# Patient Record
Sex: Male | Born: 1935 | Race: White | Hispanic: No | Marital: Married | Smoking: Former smoker
Health system: Southern US, Community
[De-identification: ages and names within clinical notes are randomized; demographics above are authoritative.]

## PROBLEM LIST (undated history)

## (undated) DIAGNOSIS — E039 Hypothyroidism, unspecified: Secondary | ICD-10-CM

## (undated) DIAGNOSIS — G20A1 Parkinson's disease without dyskinesia, without mention of fluctuations: Secondary | ICD-10-CM

## (undated) DIAGNOSIS — I10 Essential (primary) hypertension: Secondary | ICD-10-CM

## (undated) DIAGNOSIS — Z86711 Personal history of pulmonary embolism: Secondary | ICD-10-CM

## (undated) DIAGNOSIS — Z95828 Presence of other vascular implants and grafts: Secondary | ICD-10-CM

## (undated) DIAGNOSIS — Z9889 Other specified postprocedural states: Secondary | ICD-10-CM

## (undated) DIAGNOSIS — K5909 Other constipation: Secondary | ICD-10-CM

## (undated) DIAGNOSIS — Z87438 Personal history of other diseases of male genital organs: Secondary | ICD-10-CM

## (undated) DIAGNOSIS — M545 Low back pain, unspecified: Secondary | ICD-10-CM

## (undated) DIAGNOSIS — K449 Diaphragmatic hernia without obstruction or gangrene: Secondary | ICD-10-CM

## (undated) DIAGNOSIS — N201 Calculus of ureter: Secondary | ICD-10-CM

## (undated) DIAGNOSIS — Z972 Presence of dental prosthetic device (complete) (partial): Secondary | ICD-10-CM

## (undated) DIAGNOSIS — M5136 Other intervertebral disc degeneration, lumbar region: Secondary | ICD-10-CM

## (undated) DIAGNOSIS — Z8739 Personal history of other diseases of the musculoskeletal system and connective tissue: Secondary | ICD-10-CM

## (undated) DIAGNOSIS — E785 Hyperlipidemia, unspecified: Secondary | ICD-10-CM

## (undated) DIAGNOSIS — G8929 Other chronic pain: Secondary | ICD-10-CM

## (undated) DIAGNOSIS — G214 Vascular parkinsonism: Secondary | ICD-10-CM

## (undated) DIAGNOSIS — I714 Abdominal aortic aneurysm, without rupture, unspecified: Secondary | ICD-10-CM

## (undated) DIAGNOSIS — Z85828 Personal history of other malignant neoplasm of skin: Secondary | ICD-10-CM

## (undated) DIAGNOSIS — Z7901 Long term (current) use of anticoagulants: Secondary | ICD-10-CM

## (undated) DIAGNOSIS — Z974 Presence of external hearing-aid: Secondary | ICD-10-CM

## (undated) DIAGNOSIS — M51369 Other intervertebral disc degeneration, lumbar region without mention of lumbar back pain or lower extremity pain: Secondary | ICD-10-CM

## (undated) DIAGNOSIS — G20 Parkinson's disease: Secondary | ICD-10-CM

## (undated) DIAGNOSIS — M1990 Unspecified osteoarthritis, unspecified site: Secondary | ICD-10-CM

## (undated) DIAGNOSIS — G4730 Sleep apnea, unspecified: Secondary | ICD-10-CM

## (undated) DIAGNOSIS — Z86010 Personal history of colonic polyps: Secondary | ICD-10-CM

## (undated) DIAGNOSIS — Z860101 Personal history of adenomatous and serrated colon polyps: Secondary | ICD-10-CM

## (undated) DIAGNOSIS — K219 Gastro-esophageal reflux disease without esophagitis: Secondary | ICD-10-CM

## (undated) DIAGNOSIS — I4510 Unspecified right bundle-branch block: Secondary | ICD-10-CM

## (undated) DIAGNOSIS — F0150 Vascular dementia without behavioral disturbance: Secondary | ICD-10-CM

## (undated) HISTORY — PX: CHOLECYSTECTOMY OPEN: SUR202

## (undated) HISTORY — DX: Essential (primary) hypertension: I10

## (undated) HISTORY — PX: KNEE ARTHROSCOPY: SUR90

## (undated) HISTORY — PX: TOTAL KNEE ARTHROPLASTY: SHX125

## (undated) HISTORY — DX: Gastro-esophageal reflux disease without esophagitis: K21.9

## (undated) HISTORY — PX: COLONOSCOPY: SHX174

## (undated) HISTORY — PX: TONSILLECTOMY: SUR1361

## (undated) HISTORY — PX: SHOULDER OPEN ROTATOR CUFF REPAIR: SHX2407

## (undated) HISTORY — PX: CATARACT EXTRACTION W/ INTRAOCULAR LENS  IMPLANT, BILATERAL: SHX1307

## (undated) HISTORY — DX: Hyperlipidemia, unspecified: E78.5

## (undated) HISTORY — PX: ABDOMINAL AORTIC ENDOVASCULAR STENT GRAFT: SHX5707

---

## 1999-09-28 ENCOUNTER — Encounter: Payer: Self-pay | Admitting: Vascular Surgery

## 1999-09-28 ENCOUNTER — Encounter: Admission: RE | Admit: 1999-09-28 | Discharge: 1999-09-28 | Payer: Self-pay | Admitting: Vascular Surgery

## 2000-01-29 ENCOUNTER — Encounter: Payer: Self-pay | Admitting: Vascular Surgery

## 2000-01-29 ENCOUNTER — Encounter: Admission: RE | Admit: 2000-01-29 | Discharge: 2000-01-29 | Payer: Self-pay | Admitting: Vascular Surgery

## 2000-01-30 ENCOUNTER — Encounter: Payer: Self-pay | Admitting: Vascular Surgery

## 2000-01-31 ENCOUNTER — Ambulatory Visit: Admission: RE | Admit: 2000-01-31 | Discharge: 2000-01-31 | Payer: Self-pay | Admitting: Vascular Surgery

## 2000-04-01 ENCOUNTER — Encounter: Payer: Self-pay | Admitting: Vascular Surgery

## 2000-04-01 ENCOUNTER — Inpatient Hospital Stay: Admission: RE | Admit: 2000-04-01 | Discharge: 2000-04-04 | Payer: Self-pay | Admitting: Vascular Surgery

## 2000-07-31 ENCOUNTER — Encounter: Payer: Self-pay | Admitting: Specialist

## 2000-08-08 ENCOUNTER — Inpatient Hospital Stay (HOSPITAL_COMMUNITY): Admission: RE | Admit: 2000-08-08 | Discharge: 2000-08-13 | Payer: Self-pay | Admitting: Specialist

## 2000-10-22 ENCOUNTER — Encounter: Admission: RE | Admit: 2000-10-22 | Discharge: 2000-10-22 | Payer: Self-pay | Admitting: Vascular Surgery

## 2000-10-22 ENCOUNTER — Encounter: Payer: Self-pay | Admitting: Vascular Surgery

## 2001-10-28 ENCOUNTER — Encounter: Admission: RE | Admit: 2001-10-28 | Discharge: 2001-10-28 | Payer: Self-pay | Admitting: Vascular Surgery

## 2001-10-28 ENCOUNTER — Encounter: Payer: Self-pay | Admitting: Vascular Surgery

## 2002-03-16 ENCOUNTER — Encounter: Payer: Self-pay | Admitting: Orthopaedic Surgery

## 2002-03-16 ENCOUNTER — Encounter: Admission: RE | Admit: 2002-03-16 | Discharge: 2002-03-16 | Payer: Self-pay | Admitting: Orthopaedic Surgery

## 2002-03-24 ENCOUNTER — Encounter: Payer: Self-pay | Admitting: Orthopaedic Surgery

## 2002-03-24 ENCOUNTER — Inpatient Hospital Stay (HOSPITAL_COMMUNITY): Admission: RE | Admit: 2002-03-24 | Discharge: 2002-03-27 | Payer: Self-pay | Admitting: Orthopaedic Surgery

## 2002-03-24 HISTORY — PX: POSTERIOR LAMINECTOMY / DECOMPRESSION LUMBAR SPINE: SUR740

## 2002-04-28 ENCOUNTER — Encounter: Payer: Self-pay | Admitting: Vascular Surgery

## 2002-04-28 ENCOUNTER — Encounter: Admission: RE | Admit: 2002-04-28 | Discharge: 2002-04-28 | Payer: Self-pay | Admitting: Vascular Surgery

## 2003-01-24 ENCOUNTER — Encounter: Payer: Self-pay | Admitting: Specialist

## 2003-01-28 ENCOUNTER — Inpatient Hospital Stay (HOSPITAL_COMMUNITY): Admission: RE | Admit: 2003-01-28 | Discharge: 2003-02-01 | Payer: Self-pay | Admitting: Specialist

## 2003-01-31 ENCOUNTER — Encounter: Payer: Self-pay | Admitting: Specialist

## 2003-05-04 ENCOUNTER — Encounter: Admission: RE | Admit: 2003-05-04 | Discharge: 2003-05-04 | Payer: Self-pay | Admitting: Vascular Surgery

## 2003-05-04 ENCOUNTER — Encounter: Payer: Self-pay | Admitting: Vascular Surgery

## 2004-05-02 ENCOUNTER — Encounter: Admission: RE | Admit: 2004-05-02 | Discharge: 2004-05-02 | Payer: Self-pay | Admitting: Vascular Surgery

## 2005-05-08 ENCOUNTER — Encounter: Admission: RE | Admit: 2005-05-08 | Discharge: 2005-05-08 | Payer: Self-pay | Admitting: Vascular Surgery

## 2005-08-30 ENCOUNTER — Encounter: Admission: RE | Admit: 2005-08-30 | Discharge: 2005-08-30 | Payer: Self-pay | Admitting: Internal Medicine

## 2005-09-16 ENCOUNTER — Ambulatory Visit: Payer: Self-pay | Admitting: Internal Medicine

## 2005-10-09 ENCOUNTER — Ambulatory Visit: Payer: Self-pay | Admitting: Internal Medicine

## 2005-10-09 ENCOUNTER — Ambulatory Visit (HOSPITAL_COMMUNITY): Admission: RE | Admit: 2005-10-09 | Discharge: 2005-10-09 | Payer: Self-pay | Admitting: Internal Medicine

## 2005-10-23 ENCOUNTER — Encounter: Admission: RE | Admit: 2005-10-23 | Discharge: 2005-10-23 | Payer: Self-pay | Admitting: Vascular Surgery

## 2005-11-04 ENCOUNTER — Ambulatory Visit: Payer: Self-pay | Admitting: Internal Medicine

## 2006-07-23 ENCOUNTER — Emergency Department (HOSPITAL_COMMUNITY): Admission: EM | Admit: 2006-07-23 | Discharge: 2006-07-24 | Payer: Self-pay | Admitting: Emergency Medicine

## 2006-10-29 ENCOUNTER — Encounter: Admission: RE | Admit: 2006-10-29 | Discharge: 2006-10-29 | Payer: Self-pay | Admitting: Vascular Surgery

## 2007-10-13 ENCOUNTER — Ambulatory Visit: Payer: Self-pay | Admitting: Vascular Surgery

## 2007-10-13 ENCOUNTER — Encounter: Admission: RE | Admit: 2007-10-13 | Discharge: 2007-10-13 | Payer: Self-pay | Admitting: Vascular Surgery

## 2008-06-07 ENCOUNTER — Ambulatory Visit: Payer: Self-pay | Admitting: Vascular Surgery

## 2008-06-07 ENCOUNTER — Encounter (INDEPENDENT_AMBULATORY_CARE_PROVIDER_SITE_OTHER): Payer: Self-pay | Admitting: Orthopedic Surgery

## 2008-06-07 ENCOUNTER — Ambulatory Visit: Admission: RE | Admit: 2008-06-07 | Discharge: 2008-06-07 | Payer: Self-pay | Admitting: Orthopedic Surgery

## 2008-07-30 ENCOUNTER — Emergency Department (HOSPITAL_COMMUNITY): Admission: EM | Admit: 2008-07-30 | Discharge: 2008-07-30 | Payer: Self-pay | Admitting: Emergency Medicine

## 2008-10-01 ENCOUNTER — Emergency Department (HOSPITAL_COMMUNITY): Admission: EM | Admit: 2008-10-01 | Discharge: 2008-10-01 | Payer: Self-pay | Admitting: Family Medicine

## 2008-10-11 ENCOUNTER — Ambulatory Visit: Payer: Self-pay | Admitting: Vascular Surgery

## 2008-11-29 ENCOUNTER — Encounter: Admission: RE | Admit: 2008-11-29 | Discharge: 2008-11-29 | Payer: Self-pay | Admitting: Orthopaedic Surgery

## 2009-10-26 ENCOUNTER — Ambulatory Visit: Payer: Self-pay | Admitting: Vascular Surgery

## 2009-11-15 ENCOUNTER — Observation Stay (HOSPITAL_COMMUNITY): Admission: RE | Admit: 2009-11-15 | Discharge: 2009-11-16 | Payer: Self-pay | Admitting: Specialist

## 2010-01-18 ENCOUNTER — Encounter: Admission: RE | Admit: 2010-01-18 | Discharge: 2010-01-18 | Payer: Self-pay | Admitting: Specialist

## 2010-03-09 ENCOUNTER — Inpatient Hospital Stay (HOSPITAL_COMMUNITY): Admission: EM | Admit: 2010-03-09 | Discharge: 2010-03-14 | Payer: Self-pay | Admitting: Internal Medicine

## 2010-11-16 ENCOUNTER — Inpatient Hospital Stay (HOSPITAL_COMMUNITY)
Admission: RE | Admit: 2010-11-16 | Discharge: 2010-11-19 | Payer: Self-pay | Source: Home / Self Care | Attending: Neurosurgery | Admitting: Neurosurgery

## 2010-11-16 HISTORY — PX: POSTERIOR LUMBAR FUSION: SHX6036

## 2010-12-26 ENCOUNTER — Ambulatory Visit
Admission: RE | Admit: 2010-12-26 | Discharge: 2010-12-26 | Payer: Self-pay | Source: Home / Self Care | Attending: Vascular Surgery | Admitting: Vascular Surgery

## 2011-01-04 NOTE — Procedures (Unsigned)
VASCULAR LAB EXAM  INDICATION:  Follow up AAA endograft, placed in 2001.  HISTORY: Diabetes:  No. Cardiac:  No. Hypertension:  Yes.  EXAM: 1. AAA sac size 4.90 cm AP, 4.83 cm transverse 2. Previous sac size 10/26/2009 5.4 cm AP, 5.4 cm transverse.  IMPRESSION: 1. The aorta and endograft appear patent. 2. No significant change in size of the aneurysmal sac surrounding the     endograft. 3. No evidence of a endoleak was detected. 4. Limited visualization due to overlying bowel gas and patient body     habitus.      ___________________________________________ Di Kindle. Edilia Bo, M.D.  EM/MEDQ  D:  12/26/2010  T:  12/26/2010  Job:  [id number]

## 2011-02-19 LAB — TYPE AND SCREEN
ABO/RH(D): O NEG
Antibody Screen: NEGATIVE

## 2011-02-19 LAB — URINALYSIS, ROUTINE W REFLEX MICROSCOPIC
Bilirubin Urine: NEGATIVE
Hgb urine dipstick: NEGATIVE
Ketones, ur: NEGATIVE mg/dL
Specific Gravity, Urine: 1.02 (ref 1.005–1.030)
Urobilinogen, UA: 1 mg/dL (ref 0.0–1.0)

## 2011-02-19 LAB — COMPREHENSIVE METABOLIC PANEL
ALT: 18 U/L (ref 0–53)
AST: 24 U/L (ref 0–37)
Albumin: 3.8 g/dL (ref 3.5–5.2)
Alkaline Phosphatase: 79 U/L (ref 39–117)
BUN: 12 mg/dL (ref 6–23)
CO2: 30 mEq/L (ref 19–32)
Calcium: 9.3 mg/dL (ref 8.4–10.5)
Chloride: 103 mEq/L (ref 96–112)
Creatinine, Ser: 1.15 mg/dL (ref 0.4–1.5)
GFR calc Af Amer: >60 mL/min (ref >60)
GFR calc non Af Amer: >60 mL/min (ref >60)
Glucose, Bld: 122 mg/dL — ABNORMAL HIGH (ref 70–99)
Potassium: 4.3 mEq/L (ref 3.5–5.1)
Sodium: 140 mEq/L (ref 135–145)
Total Bilirubin: 0.6 mg/dL (ref 0.3–1.2)
Total Protein: 6.2 g/dL (ref 6.0–8.3)

## 2011-02-19 LAB — DIFFERENTIAL
Basophils Absolute: 0 10*3/uL (ref 0.0–0.1)
Basophils Relative: 1 % (ref 0–1)
Eosinophils Absolute: 0.2 10*3/uL (ref 0.0–0.7)
Eosinophils Relative: 2 % (ref 0–5)
Lymphocytes Relative: 34 % (ref 12–46)
Lymphs Abs: 2.3 10*3/uL (ref 0.7–4.0)
Monocytes Absolute: 0.6 10*3/uL (ref 0.1–1.0)
Monocytes Relative: 9 % (ref 3–12)
Neutro Abs: 3.6 10*3/uL (ref 1.7–7.7)
Neutrophils Relative %: 54 % (ref 43–77)

## 2011-02-19 LAB — CBC
HCT: 45.1 % (ref 39.0–52.0)
Hemoglobin: 15.1 g/dL (ref 13.0–17.0)
MCH: 31.1 pg (ref 26.0–34.0)
MCHC: 33.5 g/dL (ref 30.0–36.0)
MCV: 92.8 fL (ref 78.0–100.0)
Platelets: 149 10*3/uL — ABNORMAL LOW (ref 150–400)
RBC: 4.86 MIL/uL (ref 4.22–5.81)
RDW: 14.2 % (ref 11.5–15.5)
WBC: 6.7 10*3/uL (ref 4.0–10.5)

## 2011-02-19 LAB — APTT: aPTT: 28 seconds (ref 24–37)

## 2011-02-19 LAB — SURGICAL PCR SCREEN
MRSA, PCR: NEGATIVE
Staphylococcus aureus: NEGATIVE

## 2011-02-27 LAB — COMPREHENSIVE METABOLIC PANEL
Alkaline Phosphatase: 91 U/L (ref 39–117)
BUN: 15 mg/dL (ref 6–23)
Chloride: 102 mEq/L (ref 96–112)
Creatinine, Ser: 1.18 mg/dL (ref 0.4–1.5)
Glucose, Bld: 89 mg/dL (ref 70–99)
Potassium: 3.7 mEq/L (ref 3.5–5.1)
Total Bilirubin: 0.8 mg/dL (ref 0.3–1.2)

## 2011-02-27 LAB — CBC
HCT: 40.6 % (ref 39.0–52.0)
HCT: 46.2 % (ref 39.0–52.0)
Hemoglobin: 13.3 g/dL (ref 13.0–17.0)
Hemoglobin: 13.4 g/dL (ref 13.0–17.0)
Hemoglobin: 13.4 g/dL (ref 13.0–17.0)
Hemoglobin: 13.7 g/dL (ref 13.0–17.0)
MCHC: 33.1 g/dL (ref 30.0–36.0)
MCHC: 33.1 g/dL (ref 30.0–36.0)
MCHC: 33.3 g/dL (ref 30.0–36.0)
MCHC: 33.3 g/dL (ref 30.0–36.0)
MCHC: 33.4 g/dL (ref 30.0–36.0)
MCHC: 33.6 g/dL (ref 30.0–36.0)
MCV: 93.4 fL (ref 78.0–100.0)
Platelets: 157 10*3/uL (ref 150–400)
Platelets: 166 10*3/uL (ref 150–400)
Platelets: 169 10*3/uL (ref 150–400)
RDW: 15.3 % (ref 11.5–15.5)
RDW: 15.5 % (ref 11.5–15.5)
RDW: 15.6 % — ABNORMAL HIGH (ref 11.5–15.5)
RDW: 15.6 % — ABNORMAL HIGH (ref 11.5–15.5)
RDW: 15.8 % — ABNORMAL HIGH (ref 11.5–15.5)
WBC: 8.7 10*3/uL (ref 4.0–10.5)

## 2011-02-27 LAB — PROTIME-INR
INR: 2.7 — ABNORMAL HIGH (ref 0.00–1.49)
Prothrombin Time: 13 seconds (ref 11.6–15.2)
Prothrombin Time: 14.6 seconds (ref 11.6–15.2)
Prothrombin Time: 17.4 seconds — ABNORMAL HIGH (ref 11.6–15.2)
Prothrombin Time: 24.6 seconds — ABNORMAL HIGH (ref 11.6–15.2)
Prothrombin Time: 29 seconds — ABNORMAL HIGH (ref 11.6–15.2)

## 2011-02-27 LAB — BASIC METABOLIC PANEL
BUN: 15 mg/dL (ref 6–23)
CO2: 27 mEq/L (ref 19–32)
Calcium: 8.9 mg/dL (ref 8.4–10.5)
Creatinine, Ser: 1.29 mg/dL (ref 0.4–1.5)
Glucose, Bld: 106 mg/dL — ABNORMAL HIGH (ref 70–99)

## 2011-02-27 LAB — HEPARIN LEVEL (UNFRACTIONATED)
Heparin Unfractionated: 0.43 IU/mL (ref 0.30–0.70)
Heparin Unfractionated: 1.21 IU/mL — ABNORMAL HIGH (ref 0.30–0.70)

## 2011-02-27 LAB — PSA: PSA: 2.53 ng/mL (ref 0.10–4.00)

## 2011-03-12 LAB — BASIC METABOLIC PANEL
BUN: 20 mg/dL (ref 6–23)
Chloride: 104 mEq/L (ref 96–112)
Creatinine, Ser: 1.48 mg/dL (ref 0.4–1.5)
Glucose, Bld: 128 mg/dL — ABNORMAL HIGH (ref 70–99)

## 2011-03-12 LAB — CBC
MCHC: 33.1 g/dL (ref 30.0–36.0)
MCV: 92.6 fL (ref 78.0–100.0)
Platelets: 164 10*3/uL (ref 150–400)
RDW: 15.1 % (ref 11.5–15.5)
WBC: 6.8 10*3/uL (ref 4.0–10.5)

## 2011-04-23 NOTE — Assessment & Plan Note (Signed)
OFFICE VISIT   Wiley, Christian  DOB:  Jul 04, 1935                                       10/13/2007  ZOXWR#:[id number]   HISTORY:  I saw Christian Wiley in the office today for continued follow up  of his abdominal aortic aneurysm.  He underwent an endovascular repair  of his abdominal aortic aneurysm with an Anorex graft in April 2001.  We  have been following him on a yearly basis since that time.   Since I saw him last a year ago, he has had no abdominal or new onset  back pain.  He does have some chronic low back pain.   REVIEW OF SYSTEMS:  CARDIAC:  He has had no chest pain, chest pressure,  palpitations or arrhythmias.  PULMONARY:  He does admit to some dyspnea  on exertion.  He has had no bronchitis, asthma or wheezing.   PHYSICAL EXAMINATION:  GENERAL:  This is a pleasant 76 year old  gentleman who appears his stated age.  VITAL SIGNS:  Blood pressure 121/67, heart rate 69.  HEENT:  There are no carotid bruits.  CARDIOVASCULAR:  Reveals a regular rate and rhythm.  His aneurysm is not  palpable.  He has palpable femoral pulses and warm well perfused feet  without ischemic ulcers.  GI:  He has got positive bowel sounds which are normal pitch.  EXTREMITIES:  He has no rashes and no significant lower extremity  swelling.   DIAGNOSTICS:  I reviewed his CAT scan and the maximum diameter of his  aneurysm is 5 cm which has not changed over the last year.  There is no  evidence of endoleak.  The stent graft is in excellent position.   His aneurysm has remained stable in size over the last 7 years and I  think we can alternate CT scan with ultrasound.  When I see him back in  1 year, we will obtain an ultrasound to size his aneurysm.  He knows to  call sooner if he has problems.   Di Kindle. Edilia Bo, M.D.  Electronically Signed   CSD/MEDQ  D:  10/13/2007  T:  10/14/2007  Job:  [id number]

## 2011-04-23 NOTE — Procedures (Signed)
LOWER EXTREMITY ARTERIAL EVALUATION-SINGLE LEVEL   INDICATION:  Follow-up evaluation of stent graft.   HISTORY:  Diabetes:  No.  Cardiac:  No.  Hypertension:  Yes.  Smoking:  No.  Previous Surgery:  Abdominal aortic aneurysm stent graft repair in 2001  by Dr. Edilia Bo.   RESTING SYSTOLIC PRESSURES: (ABI)                          RIGHT                LEFT  Brachial:               196                  188  Anterior tibial:        198                  196  Posterior tibial:       198 (>1.0)           200 (>1.0)  Peroneal:  DOPPLER WAVEFORM ANALYSIS:  Anterior tibial:        Biphasic             Biphasic  Posterior tibial:       Biphasic             Triphasic  Peroneal:   PREVIOUS ABI'S:  Date: 02/19/2005  RIGHT:  >1.0  LEFT:  >1.0   IMPRESSION:  Ankle brachial indices are stable from previous study and  suggest no significant arterial occlusive disease.   ___________________________________________  Di Kindle. Edilia Bo, M.D.   MC/MEDQ  D:  10/11/2008  T:  10/11/2008  Job:  [id number]

## 2011-04-23 NOTE — Procedures (Signed)
ENDOVASCULAR STENT GRAFT EXAM   INDICATION:  Followup of endovascular stent graft.   HISTORY:                           DUPLEX EVALUATION   AAA Sac Size:                 5.4 CM AP             5.4 CM TRV  Previous Sac Size:            4.9 CM AP             4.4 CM TRV  Evidence of an endoleak?      No                    No   Velocity Criteria:  Proximal Aorta                52 cm/sec  Proximal Stent Graft          76 cm/sec  Main Body Stent Graft-Mid     69 cm/sec  Right Limb-Proximal           96 cm/sec  Right Limb-Distal             95 cm/sec  Left Limb-Proximal            110 cm/sec  Left Limb-Distal              107 cm/sec  Patent Renal Arteries?        Yes                   Yes   IMPRESSION:  1. No evidence of endo leak or stenosis identified.  2. Diameter measurements does show an increase in size.  3. Ankle brachial index within normal limits bilaterally.   ___________________________________________  Di Kindle. Edilia Bo, M.D.   CJ/MEDQ  D:  10/26/2009  T:  10/26/2009  Job:  [id number]

## 2011-04-23 NOTE — Procedures (Signed)
ENDOVASCULAR STENT GRAFT EXAM   INDICATION:  Follow-up evaluation of stent graft in 2001.   HISTORY:                           DUPLEX EVALUATION   AAA Sac Size:                 4.9 CM AP             4.4 CM TRV  Previous Sac Size:            4.78 CM AP            4.6 CM TRV  Evidence of an endoleak?      No                    No   Velocity Criteria:  Proximal Aorta                84 cm/sec  Proximal Stent Graft          62 cm/sec  Main Body Stent Graft-Mid     71 cm/sec  Right Limb-Proximal           68 cm/sec  Right Limb-Distal             106 cm/sec  Left Limb-Proximal            77 cm/sec  Left Limb-Distal              91 cm/sec  Patent Renal Arteries?        Yes                   Yes   IMPRESSION:  1. No evidence of endoleak or stenosis of stent graft.  2. Limited study secondary to body habitus/bowel gas.   ___________________________________________  Di Kindle. Edilia Bo, M.D.   MC/MEDQ  D:  10/11/2008  T:  10/11/2008  Job:  [id number]

## 2011-04-23 NOTE — Assessment & Plan Note (Signed)
OFFICE VISIT   Christian Wiley, Christian Wiley  DOB:  18-Dec-1934                                       10/11/2008  OZHYQ#:[id number]   I saw the patient in the office today for continued followup of his  stent graft.  He underwent endovascular repair of his aneurysm in April  of 2001.  This was with an AneuRx graft.  He comes in for a yearly  followup visit.  Since I saw him last he has had no abdominal or back  pain.  He has been doing well and has no specific complaints.   REVIEW OF SYSTEMS:  On review of systems he has had no chest pain, chest  pressure, palpitations or arrhythmias.  He has had no bronchitis, asthma  or wheezing.   PHYSICAL EXAMINATION:  On physical examination this is a pleasant 76-  year-old gentleman who appears his stated age.  His blood pressure is  145/71, heart rate is 67.  Neck is supple.  There is no cervical  lymphadenopathy.  I do not detect any carotid bruits.  Lungs are clear  bilaterally to auscultation.  On cardiac exam he has a regular rate and  rhythm.  His abdomen is soft and nontender.  I cannot palpate his  aneurysm.  He has palpable femoral pulses with warm well-perfused feet.  He has biphasic Doppler signals in both feet.   Doppler study in our office today shows ABIs of 100% bilaterally.  Stent  graft duplex shows that his graft is widely patent, in good position and  the aneurysm has not enlarged in size.  The maximum diameter is 4.9 cm.   Overall I am pleased with his progress.  He is now over 8 years out from  his stent graft repair.  I think it is safe to continue with followup  using the duplex protocol.  I will see him back in 1 year.  He knows to  call sooner if he has problems.   Di Kindle. Edilia Bo, M.D.  Electronically Signed   CSD/MEDQ  D:  10/11/2008  T:  10/12/2008  Job:  [id number]

## 2011-04-26 NOTE — Op Note (Signed)
Christian Wiley, Christian Wiley                        ACCOUNT NO.:  [id number]   MEDICAL RECORD NO.:  [id number]                   PATIENT TYPE:  INP   LOCATION:  0465                                 FACILITY:  [facility]   PHYSICIAN:  R. Valma Cava, M.D.             DATE OF BIRTH:  Aug 26, 1935   DATE OF PROCEDURE:  01/28/2003  DATE OF DISCHARGE:                                 OPERATIVE REPORT   PREOPERATIVE DIAGNOSIS:  Left knee end-stage osteoarthritis.   POSTOPERATIVE DIAGNOSIS:  Left knee end-stage osteoarthritis.   PROCEDURE:  Left total knee arthroplasty.   SURGEON:  R. Valma Cava, M.D.   ASSISTANT:  Jaquelyn Bitter. Chabon, P.A.-C   ANESTHESIA:  General with femoral nerve block.   ESTIMATED BLOOD LOSS:  Less than 50 mL.   DRAINS:  Two medium Hemovacs.   COMPLICATIONS:  None.   TOURNIQUET TIME:  1 hour and 50 minutes at 375 mmHg.   OPERATIVE IMPLANTS:  Osteonics components all cemented.  Size 9 femur, size  11 tibia, 12 mm flex tibial insert with a 26 mm all polyethylene patella.   DESCRIPTION OF PROCEDURE:  The patient was counseled in the holding area,  correct side was identified, chart signed appropriately, IV started,  antibiotics given, and he was taken to the OR where the femoral nerve block  was administered. He was then placed under general anesthesia.   The left knee was examined with a 5 degree flexion contracture flexed to 115  degrees. There is malalignment.  A Foley catheter was placed utilizing  sterile technique by the OR circulating nurse.  The left lower extremity was  elevated, prepped with Durapred and towel draped in sterile fashion.  Exsanguinated with Esmarch, tourniquet was inflated to 375 mmHg.   A straight midline incision was made through the skin and subcutaneous  tissue. Small bleeders electrocoagulated, mediolateral soft tissue flaps  developed. Medial parapatellar arthrotomy was performed. Medial soft tissue  release was done.  The patella  was then flexed. End-stage arthritic change,  bone against bone contact. The cruciate ligaments were resected.  A starting  hole was made in the distal femur, canal was irrigated, effluent was clear.  The intramedullary rod was gently placed.   I chose to take a 5 degree valgus cut and 10 mm cut off the distal femur.  The distal femur was cut. The distal femur was found to be a size 9, rotator  marks made, distal femur was cut to fit a size 9. Medial and lateral menisci  removed, geniculate vessels were coagulated, tibial eminence was resected.  Posterior neurovascular structures were protected throughout the entire  case. Osteophytes were removed from the proximal tibia. The proximal tibia  was found to be a size 11.   A starting hole was made, step reamer utilized, intramedullary rod was  gently placed. The canal was irrigated until the effluent was clear. At this  time, I  chose to take initially 10 followed by 2 more mm off the lateral  side with a 5 degree posterior __________ alignment. Posteromedial and  posterior femoral osteophytes noted under direct visualization, femoral  trochlea prepared in standard fashion appropriately.   At this point time, with a size 9 femur, size 11 tibia with a 10 mm flex  insert, through an excellent range of motion and soft tissue balance,  rotation marks were made in the proximal tibia, delta keel was performed in  standard fashion. The patella was found to be a size 26. It was reamed to a  depth of 10 mm, locking holes were made and excess bone was removed. The  knee was copiously irrigated with pulsatile lavage.   At this point in time, utilizing modern cement technique, all components  were cemented in place. A size 11 tibia, size 9 femur, and 26 patella. After  the cement cured, I went through trials of 10 and 12 mm thickness with a  flex type insert with 12 mm insert with excellent flexion/extension balance.  Patellofemoral tracking was  anatomic and did not require release and the  knee was well balanced. This was removed, excess cement was removed, the  knee was copiously irrigated, small veins electrocoagulated. Bone wax was  placed on exposed bony surfaces and the final components tapped into place.   There was a slight avulsion of the proximal medial patellar tendon during  the case. This was recognized and protected throughout the entire case. At  this time, a Mitek anchor was placed securing the patella tendon back to its  anatomic insertion after scarifying the base and then repairing it to the  medial soft tissue. I do not feel that this compromised the extensor  mechanism.  We will go ahead and rehabilitate him in the normal  postoperative mode. Each layer was then irrigated with antibiotic solution  and then closure. Two medium Hemovac drains were placed. Sequential closure  of layers was done with Vicryl, arthrotomy Vicryl, subcu Vicryl, skin with  subcuticular Monocryl sutures. Steri-Strips were applied.  Marcaine 0.5%, 20  mL with epinephrine placed through the drain. A sterile compressive dressing  for the knee, Ace wraps and tourniquet was deflated.   He had normal pulses in the foot and ankle at the end of the case with no  complications.  Ice pack applied, knee immobilized in full extension and he  was given another gram of Ancef intravenously, tourniquet deflated. Sponge  and needle count were correct. There were no complications. The patient was  taken from the operating room to the PACU in stable condition.                                               Christian Wiley, M.D.    RAC/MEDQ  D:  01/28/2003  T:  01/28/2003  Job:  [id number]

## 2011-04-26 NOTE — H&P (Signed)
[facility]  Patient:    Christian Wiley, Christian Wiley Visit Number: [id number] MRN: [id number]          Service Type: SUR Location: 4W 0470 01 Attending Physician:  Patricia Nettle Dictated by:   Sammuel Cooper. Mahar, P.A. Admit Date:  03/24/2002 Discharge Date: 03/27/2002                           History and Physical  DATE OF BIRTH:  17-Dec-1934  CHIEF COMPLAINT:  Back and left leg pain.  HISTORY OF PRESENT ILLNESS:  The patient is a 76 year old male with a long history of back and leg pain.  He has failed conservative management.  It has gotten to the point that it is near constant in nature, affecting his activities of daily living and quality of life.  The risks and benefits of the proposed surgery were discussed with the patient by Dr. Sharolyn Douglas.  He indicated understanding and wishes to proceed.  ALLERGIES:  No known drug allergies.  MEDICATIONS:  Lisinopril, Lipitor, Celebrex, Paxil, and Vicodin.  These medications will all be brought with him to the hospital for dosage as well as frequency clarification.  PAST MEDICAL HISTORY: 1. Hypertension. 2. AAA repair. 3. Hiatal hernia. 4. Anxiety and depression. 5. Hypercholesterolemia.  PAST SURGICAL HISTORY: 1. Cholecystectomy in 1980s. 2. AAA repair in April 2002. 3. Right total knee in 2002.  SOCIAL HISTORY:  The patient denies any tobacco use.  Denies alcohol use.  He is married.  He works part-time as a Solicitor in delivery.  He has three grown children.  He lives in a one-level home.  FAMILY HISTORY:  Coronary artery disease, MI, hypertension, cancer, and osteoarthritis.  REVIEW OF SYSTEMS:  The patient denies any fevers, chills, night sweats, or bleeding tendencies.  CNS:  Denies any blurred vision, double vision, headaches, seizure, or paralysis.  CARDIOVASCULAR:  Denies any chest pain, angina, orthopnea, or palpitations.  PULMONARY:  Denies any shortness of breath, productive cough, or  hemoptysis.  GASTROINTESTINAL:  Denies any nausea, vomiting, constipation, diarrhea, melena, or bloody stools. GENITOURINARY:  Denies any dysuria, hematuria, or discharge.  MUSCULOSKELETAL: Complains of left leg pain.  Denies any paresthesias, numbness, or paralysis.  PHYSICAL EXAMINATION:  VITAL SIGNS:  Blood pressure 100/68, respirations 16 and unlabored, pulse 72 and regular.  GENERAL:  The patient is a 76 year old white male who is alert and oriented. In no acute distress.  Well-nourished, well-groomed.  Appears stated age. Very pleasant and cooperative to examination.  HEENT:  Head normocephalic, atraumatic.  Pupils equal, round, and reactive to light.  Extraocular movements intact.  Nares patent bilaterally.  Pharynx is clear.  NECK:  Soft to palpation.  No carotid bruits, lymphadenopathy noted, or thyromegaly present.  CHEST:  Clear to auscultation bilaterally.  No rales, rhonchi, stridor, wheezes, or friction rubs appreciated.  BREASTS:  Not pertinent, not performed.  HEART:  S1, S2.  Distant heart sounds.  Regular rate and rhythm.  No murmurs, gallops, or rubs noted.  ABDOMEN:  Soft to palpation.  Positive bowel sounds throughout.  Nontender, nondistended.  No organomegaly noted.  GENITOURINARY:  Not pertinent, not performed.  EXTREMITIES:  Sensation grossly intact to light touch throughout bilateral lower extremities.  Motor function is intact to bilateral lower extremities. Neurovascularly intact.  Posterior tibialis pulses intact bilaterally.  SKIN:  Intact.  Without any lesions or rashes.  IMPRESSION:  Spinal stenosis at L2 through S1.  PLAN: 1. Admit to [facility] on March 24, 2002, for L2 to S1 laminectomy    to be done by Dr. Sharolyn Douglas. 2. The patients primary medical doctor is Dr. Jacky Kindle, who has given [place] a    letter of medical clearance stating that the patient is cleared for    surgery. Dictated by:   Sammuel Cooper. Mahar, P.A. Attending  Physician:  Patricia Nettle. DD:  03/25/02 TD:  03/26/02 Job: [id number]

## 2011-04-26 NOTE — Discharge Summary (Signed)
[facility]  Patient:    Christian Wiley, Christian Wiley Visit Number: [id number] MRN: [id number]          Service Type: SUR Location: 4W 0470 01 Attending Physician:  Patricia Nettle Dictated by:   Sammuel Cooper. Mahar, P.A.-C. Admit Date:  03/24/2002 Discharge Date: 03/27/2002                             Discharge Summary  DATE OF BIRTH:  December 23, 1932  ADMITTING DIAGNOSES: 1. Spinal stenosis of L2 through S1. 2. Hypertension. 3. Abdominal aortic aneurysm repair in April 2002. 4. Hiatal hernia. 5. Anxiety and depression. 6. Hypercholesterolemia.  DISCHARGE DIAGNOSES: 1. Status post L2 to S1 laminectomy. 2. Postoperative hemorrhagic anemia which was very mild and asymptomatic and    did not require transfusion. 3. Spinal stenosis of L2 through S1. 4. Hypertension. 5. Abdominal aortic aneurysm repair in April 2002. 6. Hiatal hernia. 7. Anxiety and depression. 8. Hypercholesterolemia.  PROCEDURE PERFORMED:  L2 to S1 laminectomy which was done by Dr. Sharolyn Douglas, assisted by Karie Chimera, P.A.-C.  Anesthesia was general.  CONSULTS:  None.  BRIEF HISTORY:  The patient is a 76 year old male with a long history of back and leg pain.  He has failed conservative management and has gotten to the point that it is constant in nature, effecting his activities of daily living and quality of life.  The risks and benefits of both surgeries were discussed with the patient.  He indicated understanding and wished to proceed.  LABORATORY DATA:  On March 16, 2002, a CBC was within normal limits.  PT/INR and PTT all within normal limits.  Complete metabolic panel within normal limits with the exception of glucose of 114 and calcium of 10.6.  UA was negative.  Hemoglobin and hematocrit monitored on March 25, 2002, on postoperative day #1, is 11.3 and 32.6.  Basic metabolic panel was monitored for two days postoperatively was within normal limits with the exception  of glucose of 111 on March 25, 2002 and 123 on March 26, 2002.  X-rays from March 24, 2002, portable spine was used for intraoperative localization.  No other x-ray reports on the chart.  No EKG seen on the chart either.  HOSPITAL COURSE:  Following admission on March 24, 2002, the patient was taken to the operating room for the above listed procedure.  He tolerated the procedure very well without any intraoperative complications.  He was transferred to the recovery room in stable condition.  Neurovascular checks were started immediately postoperatively and remained intact.  Motor function remained intact.  Strength 4 out of 5 out of 5 throughout bilateral lower extremities throughout his hospital stay, and sensation was grossly intact to light touch throughout bilateral lower extremities throughout the hospital stay.  The patients diet was advanced after passing flatus without any difficulty to a normal diet.  The patient underwent appropriate antibiotic course x48 hours without any complications.  Pain control was initially gained utilizing a combination of PCA as well as p.o. Percocet.  He transitioned over to p.o. analgesics completely and pain control remained adequate.  The patients Hemovac drain was discontinued on postoperative day #1 without any complications intact.  His diet was advanced on that day.  On postoperative day #2, his dressing was changed, and the incision looked good without any signs or symptoms of infection.  There was no active drainage.  Physical therapy and occupational therapy were consulted  to work with the patient on ADLs as well as back precaution.  He did well with them, indicated understanding, and ability to follow them prior to discharge.  The patient was given a lumbar corset to use when out of bed.  By March 27, 2002, the patient was medically stable, and ready for discharge. He was orthopedically stable and ready for discharge.  He  indicated understanding of back precautions.  He is doing very well and eager to go home.  He is neurovascularly intact.  Motor function was intact.  Sensation was intact.  His incision looked excellent without any signs or symptoms of infection.  PLAN ON DISCHARGE:  Activity is back precautions as instructed, corset when out of bed, shower on postoperative day five to six assuming there is no draining from his incision.  If there is, he should not shower until until this has stopped.  The dressing changes should be done a daily basis. Supervised instructions were given for this.  FOLLOWUP:  With Dr. Sharolyn Douglas two weeks postoperatively.  Instructed to call [phone] for an appointment.  MEDICATIONS:  Colace 100 mg p.o. b.i.d., Percocet p.r.n., and Robaxin p.r.n.  DIET:  Preoperative diet - recommend high fiber and low fat.  DISPOSITION:  The patient is being discharged to home with family assistance.  CONDITION ON DISCHARGE:  Stable and improved. Dictated by:   Sammuel Cooper. Mahar, P.A.-C. Attending Physician:  Patricia Nettle. DD:  04/15/02 TD:  04/19/02 Job: [id number]

## 2011-04-26 NOTE — H&P (Signed)
NAME:  Christian Wiley, Christian Wiley NO.:  [id number]   MEDICAL RECORD NO.:  [id number]                   PATIENT TYPE:   LOCATION:                                       FACILITY:   PHYSICIAN:  Erasmo Leventhal, M.D.         DATE OF BIRTH:  [birth year]   DATE OF ADMISSION:  DATE OF DISCHARGE:                                HISTORY & PHYSICAL   CHIEF COMPLAINT:  Left knee osteoarthritis.   HISTORY OF PRESENT ILLNESS:  This is a 76 year old gentleman well-known to  [place] with a history of bilateral knee osteoarthritis who underwent total knee  replacement on the right two years ago and had excellent result.  He was  well pleased with that.  Due to continued pain in the left knee and end-  stage osteoarthritis by x-ray, the patient is now scheduled for total knee  replacement on the left knee.  The surgery risks, benefits, and after-care  were discussed in detail with the patient and surgery is to go ahead as  scheduled.   ALLERGIES:  No known drug allergies.   CURRENT MEDICATIONS:  Lisinopril, hydrochlorothiazide 20/12.5 one daily,  Paxil 20 mg one daily, and Lipitor 40 mg one daily.   PAST SURGICAL HISTORY:  Abdominal aortic aneurysmal stent and  cholecystectomy.  Total knee replacement on the right and laminectomy of the  LS spine.   PAST MEDICAL HISTORY:  History of abdominal aortic aneurysm, reflux, and  kidney stones.   FAMILY HISTORY:  This is positive for coronary artery disease, cancer, and  arthritis.   SOCIAL HISTORY:  The patient is married.  He is retired.  He does not smoke  or drink.   REVIEW OF SYSTEMS:  CENTRAL NERVOUS SYSTEM:  Negative for headache, blurry  vision or dizziness.  PULMONARY:  No shortness of breath,  PND, orthopnea.  CARDIOVASCULAR:  No chest pain or palpitations.  GI:  Negative for ulcers,  hepatitis.  Positive for GERD.  GU:  Negative for urinary tract difficulties  other than kidney stones.  MUSCULOSKELETAL:  Positive  in HPI.   PHYSICAL EXAMINATION:  VITAL SIGNS:  Blood pressure 124/68, respiratory rate  18, pulse 68 and regular.  HEENT:  Head normocephalic.  Nose patent.  Ears patent.  Pupils are equal,  round and reactive to light. Throat without injection.  NECK:  Supple without adenopathy.  Carotids 2+ without bruit.  CHEST:  Clear to auscultation.  No rales or rhonchi.  Respirations 18.  CARDIOVASCULAR:  Regular rate and rhythm, 68 beats per minute without  murmur.  ABDOMEN:  Soft with active bowel sounds, no masses or organomegaly.  No  bruits.  EXTREMITIES:  Within normal limits with the exception of the right knee  which is status post total knee replacement with full extension and flexion  to 115.  The left knee shows decreased range of motion with discomfort.  His  neurovascular status shows dorsalis pedis  and posterior tibialis pulses 2+  and sensibility normal.  SKIN:  Within normal limits.  NEUROLOGIC:  The patient is alert and oriented to time, place and person.  Cranial nerves II-XII grossly intact.   IMPRESSION:  End-stage osteoarthritis left knee.   PLAN:  Total knee replacement left knee.   NOTE:  The patient's medical doctor is Geoffry Paradise, M.D.  The patient  had [facility] for his home physical therapy and home health needs  prior with [facility] and would like to have them again this time.     Jaquelyn Bitter. Chabon, P.A.                   Erasmo Leventhal, M.D.    SJC/MEDQ  D:  01/26/2003  T:  01/26/2003  Job:  [id number]

## 2011-04-26 NOTE — Discharge Summary (Signed)
[facility]  Patient:    Christian Wiley, Christian Wiley                     MRN: [id number] Adm. Date:  [id number] Disc. Date: [id number] Attending:  Erasmo Leventhal Dictator:   Alexzandrew L. Perkins, P.A.-C.                           Discharge Summary  ADMISSION DIAGNOSES: 1. End-stage osteoarthritis, bilateral knees, right greater than left. 2. Elevated cholesterol. 3. Hypertension. 4. History of kidney stones. 5. Status post stenting abdominal aortic aneurysm, April 2001. 6. Status post cataract removal with lens implants, right eye, June 2001.  DISCHARGE DIAGNOSES: 1. End-stage osteoarthritis, right knee, status post right total knee    replacement arthroplasty. 2. Left knee osteoarthritis. 3. Elevated cholesterol. 4. Hypertension. 5. History of kidney stones. 6. Status post stenting abdominal aortic aneurysm, April 2001. 7. Status post cataract removal with lens implants, right eye, June 2001.  PROCEDURE:  The patient was taken to the OR on August 08, 2000, underwent a right total knee replacement arthroplasty.  Surgeon: R. Valma Cava, M.D. Assistant: Avel Peace, P.A.-C.  Surgery was done under general anesthesia with epidural postop.  Two Hemovac drains placed at time of surgery. Tourniquet time: 99 minutes at 350 mmHg.  BRIEF HISTORY:  The patient is a 76 year old male with progressive osteoarthritis of bilateral knees.  Most recently, his right knee has been more symptomatic than his left.  He has exhausted conservative treatments including Synvisc injections, anti-inflammatories, and even prior arthroscopic surgery.  At this point, he receives no relief with analgesics and is having difficulty maintaining his desired level of physical activity.  X-rays showed end-stage osteoarthritis, and it was felt he would benefit from undergoing total knee replacement arthroplasty.  Risks and benefits of the procedure were discussed with the patient.   He has elected to proceed with surgery.  LABORATORY DATA:  CBC on admission: Hemoglobin 13.4, hematocrit 39.5, white cell count 7.0, red cell count 4.55, differential all within normal limits. PT and PTT on admission were 12.9 and 29, respectively.  INR was 1.0. Serial pro times were followed per Coumadin protocol.  Last noted PT and INR were 22.5 and 2.9 prior to discharge.  Chemistry panel on admission all within normal limits. Followup BMET on August 09, 2000: Increasing glucose of 95 to 134, otherwise BMET all within normal limits.  Urinalysis on admission was negative.  Blood group type O negative.  Right knee films dated July 31, 2000: Chondrocalcinosis with degenerative changes in the right knee.  Previous chest x-ray dated January 30, 2000: No active disease, stable, scarring, atelectasis at the lung bases.  Previous EKG report found on this chart dated January 30, 2000: Normal sinus rhythm, incomplete right bundle branch block, borderline EKG, confirmed by Dr. Jerral Bonito.  HOSPITAL COURSE:  The patient was admitted to [facility], taken to the OR, underwent the above stated procedure without complications.  The patient tolerated the procedure well, later transferred to the recovery room and then to the orthopedic floor for continued postoperative care.  The patient had an epidural drip placed postoperatively.  The epidural was adjusted postoperatively and then again on postop day #1.  The patient did not receive great relief with the epidural overnight.  Therefore, the epidural was discontinued by anesthesia on postop day #1.  Hemovac drains also were pulled on postop day #1.  The  patient was started on PCA analgesics in the form of Dilaudid following removal of the epidural.  Physical and occupational therapy were consulted postoperatively to assist with gait training and ambulation.  The patient was initially slow to progress with physical therapy, only  ambulating approximately 25 feet by postop day #2; however, he did progress very well and was ambulating greater than 150 and even up to 200 feet prior to discharge.  He did have some heel pain which was felt to be from the foot plate of the CPM.  This was noted on postop day #4. CPM was adjusted and padded as needed.  We originally wanted to have him scheduled on outpatient physical therapy; however, due to his living arrangements, he preferred a short course of home health physical therapy, and once he was more mobile, would have him arrange to set up outpatient physical therapy.  These arrangements were made per the discharge planner prior to his release from hospital.  Wound care and wound observation was initiated on postop day #2 with his first dressing change.  The wound appeared to be healing well.  There were no acute signs of infection throughout the hospital course.  As stated before, he did start to progress very well with his physical therapy, and by August 13, 2000, the patient had been weaned off his PCA analgesics, tolerating his p.o. medications, ambulating fairly independently, and it was decided the patient could be discharged home.  DISCHARGE PLAN:  The patient was discharged home on August 13, 2000.  DISCHARGE DIAGNOSES:   As above.  DISCHARGE MEDICATIONS: 1. Coumadin as per pharmacy protocol. 2. Percocet for pain. 3. Robaxin for spasms.  DIET:  Low-sodium, low-cholesterol diet as tolerated.  FOLLOWUP:  Two weeks from date of surgery.  ACTIVITY:  Weightbearing as tolerated.  He was initially started on home health physical therapy and home health nursing.  He was also set up for a CPM for home use.  DISPOSITION:  Home.  CONDITION UPON DISCHARGE:  Improved. DD:  09/17/00 TD:  09/19/00 Job: [id number]

## 2011-04-26 NOTE — Discharge Summary (Signed)
NAME:  DELPHIN, FUNES                        ACCOUNT NO.:  [id number]   MEDICAL RECORD NO.:  [id number]                   PATIENT TYPE:  INP   LOCATION:  0465                                 FACILITY:  [facility]   PHYSICIAN:  Erasmo Leventhal, M.D.         DATE OF BIRTH:  Nov 21, 1935   DATE OF ADMISSION:  01/28/2003  DATE OF DISCHARGE:  02/01/2003                                 DISCHARGE SUMMARY   ADMISSION DIAGNOSIS:  Left knee osteoarthritis.   DISCHARGE DIAGNOSIS:  Left knee osteoarthritis.   OPERATION:  Total knee replacement of left knee.   HISTORY OF PRESENT ILLNESS:  This is a 76 year old gentleman well known to  [place] with a history of bilateral knee osteoarthritis who underwent total knee  replacement on the right two years ago, and had excellent result, and due to  continued pain of the left knee and end-stage osteoarthritis by x-ray, the  patient is now scheduled for total knee replacement of the left knee.  Surgery, benefits, and aftercare were discussed in detail with the patient,  and surgery is scheduled.   LABORATORY DATA:  Admission CBC within normal limits.  Hemoglobin and  hematocrit reached a low of 9.8 and 28.6 on 01/31/03, and on 02/01/03, it was  back up to the 9.9 and 28.7 range.  PT and PTT within normal limits on  admission.  On discharge, his PT was 12.9, INR of 3.2.  Admission BMET  showed glucose high at 134, by discharge it was within normal limits.  Urinalysis within normal limits.   HOSPITAL COURSE:  The patient tolerated the operative procedure well.  On  the first postoperative day he had a temperature to 100.8 max, vital signs  were stable, dressing was dry, Hemovac was still with significant drainage  and kept in one extra day.  CPM was to start.  On the second postoperative  day, the patient was alert and oriented.  Temperature to 100.1.  The patient  had limited use of the CPM.  Hemovac was removed.  On the third  postoperative day, vital  signs shows blood pressure slightly low at 90/50,  pulse 80 and regular, respirations 16.  The patient was alert and oriented.  No shortness of breath, no chest pain, no dizziness.  Temperature to 101.  Lungs were clear to auscultation.  Heart showed regular rate and rhythm with  distinct heart sounds.  Abdomen was soft.  Dressing was changed, wound was  benign.  There was no calf swelling or tenderness.  Sensation and  circulation were grossly intact in the foot.  The patient clinically was  stable despite his low blood pressure.  His blood pressure medications were  held.  His pain medications were minimized.  He was given a 500 cc normal  saline bolus then KVO and fluid restriction.  Medical consult with Dr.  Jacky Kindle was obtained to evaluate his hypertension and temperature, and  Dopplers were obtained  of his calf.  The following postoperative day, he had  no shortness of breath, his blood pressure had stabilized, his Dopplers were  pending.  Dr. Jacky Kindle came in and evaluated the patient, and managed his  hypotension.  On the fourth postoperative day, the patient was feeling good,  his blood pressure was 110/58 manually by myself, pulse of 83, respirations  20, temperature 100.6.  Chest x-ray showed no active disease.  A BMET was  within normal limits.  Hemoglobin and hematocrit were stable.  PT had an INR  of 3.2.  I discussed this with pharmacy, and they will hold the Coumadin for  today and recheck Thursday and Monday.  Doppler showed no evidence of deep  vein thrombosis.  The patient was alert and oriented and doing well, and  wanted to be discharged home, and this was appropriate.  The patient was  subsequently discharged home for followup in the office.   CONDITION ON DISCHARGE:  Improved.   DISCHARGE MEDICATIONS:  1. Percocet.  2. Robaxin.  3. Coumadin per pharmacy protocol.   DISCHARGE INSTRUCTIONS:  1. He is instructed to do his home CPM.  2. Do his home exercise  program.  3. Call the office if problems or questions arise.     Jaquelyn Bitter. Chabon, P.A.                   Erasmo Leventhal, M.D.    SJC/MEDQ  D:  03/04/2003  T:  03/05/2003  Job:  [id number]

## 2011-04-26 NOTE — Op Note (Signed)
[facility]  Patient:    Christian Wiley, Christian Wiley                     MRN: [id number] Proc. Date: 08/08/00 Adm. Date:  [id number] Attending:  Erasmo Leventhal                           Operative Report  PREOPERATIVE DIAGNOSIS:  Right knee end-stage osteoarthritis.  POSTOPERATIVE DIAGNOSIS:  Right knee end-stage osteoarthritis.  OPERATION:  Right total knee arthroplasty.  SURGEON:  R. Valma Cava, M.D.  ASSISTANT:  Alexzandrew L. Perkins, P.A.-C.  ANESTHESIA:  General with epidural.  ESTIMATED BLOOD LOSS:  Less than 50 cc.  DRAINS:  Two medium Hemovac.  COMPLICATIONS:  None.  TOURNIQUET TIME:  99 minutes at 350 mmHg.  DISPOSITION:  To PACU stable.  DESCRIPTION OF PROCEDURE:  The patient was counseled in the holding area.  IV was started and antibiotics were given.  All papers were signed appropriately. He was taken to the operating room and placed in the lateral position.  A spinal anesthetic was attempted and was unsuccessful by the anesthesiologist. He then placed an epidural catheter and the patient was placed under general endotracheal anesthesia.  The right knee was examined.  He had 5 degree flexion extension flexure at 120 degrees.  Varus mild alignment.  A Foley catheter was placed utilizing a sterile technique by the OR circulating nurse.  The right lower extremity was elevated and prepped with Duraprep and draped into a sterile fashion.  It was exsanguinated with an Esmarch and inflated to 350 mmHg.  An anterior midline incision was made through the skin and subcutaneous tissues.  Small bleeders were electrocoagulated.  Medial and lateral soft tissue flaps were developed.  A medial parapatellar arthrotomy was performed.  The patella was everted.  Medial soft tissue was raised over the proximal medial tibia.  The knee was flexed.  The cruciate ligaments were incised.  A starting hole made in the distal femur, irrigated.   An intermedullary rod was placed.  Based upon preoperative x-rays and calculations, we chose a 5 degree valgus and a 2 mm cut off the distal femur. The distal femur was found to be a size #9.  Rotation marks were made.  The distal femur was cut appropriately.  The proximal tibia and tibia eminence was resected.  The medial and lateral menisci were removed.  The proximal tibia was found to be a size #11.  The starting hole was made.  The canal was irrigated.  The intermedullary rod was placed.  We took a 10 mm cut off based upon the lateral side, a 5 degree posterior slope.  The proximal tibia was cut appropriately.  Geniculates were coagulated posteromedial and posterolateral.  The posteromedial femoral osteophytes were removed.  There were no significant posterolateral femoral osteophytes.  Femoral cut was repaired in standard fashion.  At this time with trials of 9 mm femur, 11 mm tibia, 10 mm polyethylene insert, we had excellent range of motion, soft tissue balance, instability, flexion and extension.  The knee was flexed.  The patella was found to be a size #26.  It was ranged to a depth of 10 mm. Locking holes were made and excess bone was removed.  At this time all components were commented into place utilizing modern cement technique, size #9 femur, size #11 tibia, 10 mm polyethylene insert trial with a 26 mm patella.  After  the cement had cured, we put the knee through a range of motion.  With the 10 mm insert he had excellent range of motion and soft tissue balance.  The knee was then flexed.  The trial was removed and a 10 mm polyethylene insert was placed.  The posterior cruciate was posterior stabilized tibial implant.  The knee was put through a range of motion.  He had excellent flexion and extension and was well balanced to flexion and extension.  Tracking was excellent.  I did perform a lateral release that improved patellofemoral tracking at this time.  I felt a  postoperative contracture would develop.  Hemostasis was obtained.  At this point and time, the knee was closed and serially irrigated each layer with antibiotic solution.  Subcu with Vicryl.  The skin was closed with staples.  A sterile dressing was applied.  The tourniquet was deflated. Normal alignment.  An ice pack and knee immobilizer placed.  He was given another gram of Ancef intravenously.  He was awake and extubated and taken to the PACU in stable condition.  There were no complications.  Sponge and needle count were correct.  The patient was doing well in the recovery room at the time of this dictation. DD:  08/08/00 TD:  08/11/00 Job: [id number]

## 2011-04-26 NOTE — Op Note (Signed)
[facility]  Patient:    Christian Wiley, Christian Wiley Visit Number: [id number] MRN: [id number]          Service Type: SUR Location: 4W 0470 01 Attending Physician:  Patricia Nettle Dictated by:   Patricia Nettle, M.D. Proc. Date: 03/24/02 Admit Date:  03/24/2002                             Operative Report  DATE OF BIRTH:  May 08, 1935  PREOPERATIVE DIAGNOSIS:  Lumbar spinal stenosis with neurogenic claudication.  PROCEDURE:  L2-3, L3-4, L4-5, L5-S1 lumbar laminectomy with decompression of the common dural sac and nerve roots bilaterally.  SURGEON:  Patricia Nettle, M.D.  ASSISTANT:  Ottie Glazier. Wynona Neat, P.A.-C.  ESTIMATED BLOOD LOSS:  385 cc.  ANESTHESIA:  General endotracheal.  COMPLICATIONS:  None.  INDICATION:  The patient is a 76 year old male, whom I saw back in October 2002.  At this point, he had symptoms consistent with neurogenic claudication.  His MRI scan showed multilevel spondylitic changes and both central and lateral recess stenosis.  He had a series of epidural steroid injections and initially had an excellent response.  Unfortunately, over time, the injections lost their effectiveness and, at this point, he is completely miserable.  He is "walking like a penguin."  He has severe back and leg pain, again with symptoms of neurogenic claudication with weakness in his legs, left equal to right.  At times, he is actually unable to get up from a seated position.  He has taken various pain medications including hydrocodone and Roxicet without significant relief.  After weighing the risks, benefits, and alternatives, the patient has elected to undergo multilevel lumbar laminectomy in hopes of improving his symptoms.  DESCRIPTION OF PROCEDURE:  The patient was properly identified in the holding area, taken to the operating room.  He underwent general endotracheal anesthesia without difficulty.  He was turned prone onto the Acumed four-poster  positioning frame.  All bony prominences were padded.  His face was protected at all times.  The back was prepped and draped in the usual sterile fashion.  An 8 cm incision was made from L2 to S1.  Dissection was carried down to the deep fascia.  The deep fascia was incised, and the paraspinous muscles were stripped out to the level of the facet joints.  Care was taken to preserve the facet capsules.  The pars intra-articularis was identified at L3, 4, and 5.  After adequate exposure was obtained, an intraoperative x-ray was taken with markers on the L4 and 5 spinous process. A laminectomy was then performed using Leksell rongeurs and Kerrison punches involving the inferior portion of L2 and the entire lamina of L3, 4, and 5. After a central decompression had been performed, we decompressed the lateral recess bilaterally.  Using microdissection technique, we identified the L3, 4, 5, and S1 roots bilaterally.  Partial facetectomies were performed, and the nerves were traced out the foramen bilaterally.  A blunt probe was used to confirm patency out the foramen.  There was significant lateral recess stenosis with overhanging superior facet, most significant at the L3-4 level but also quite severe at the other levels.  Epidural bleeding was controlled with bipolar as well as Gelfoam.  After we were happy with the decompression, the wound was copiously irrigated.  We then closed the deep fascia with a running #1 Vicryl suture.  A deep Hemovac drain had previously been  placed. We then closed the subcutaneous layer with 2-0 interrupted Vicryl followed by a running nylon suture.  Sterile dressing was applied.  The patient was turned prone and extubated without difficulty.  He was transferred to the recovery room in stable condition. Dictated by:   Patricia Nettle, M.D. Attending Physician:  Patricia Nettle. DD:  03/24/02 TD:  03/25/02 Job: [id number]

## 2011-04-26 NOTE — H&P (Signed)
[facility]  Patient:    Christian Wiley, Christian Wiley                       MRN: [id number] Adm. Date:  08/08/00 Attending:  R. Valma Cava, M.D. Dictator:   Della Goo, P.A. CC:         [name]. Jacky Kindle, M.D.   History and Physical  CHIEF COMPLAINT:  Right knee pain.  HISTORY OF PRESENT ILLNESS:  Christian Wiley is a 76 year old white male with progressive osteoarthritis of bilateral knees.  Most recently, his right knee has been more symptomatic than the left.  He has exhausted conservative treatments including arthroscopic surgery, physical therapy, analgesics, Synvisc injections and anti-inflammatory medications.  At this point, he receives no relief with analgesics and is having difficulty maintaining his desired level of physical activity.  X-rays shows end-stage osteoarthritis of bilateral knees.  He has marked bilateral varus deformity as well as chondrocalcinosis with bone-on-bone contact.  Due to his continued symptoms of pain and dysfunction as well as significant findings on x-rays and failure to reach treatment goals with conservative treatment, he is being admitted to undergo a right total knee replacement.  It is planned for in the future to proceed with a left total knee replacement, once he has recovered from this surgery.  ALLERGIES:  No known drug allergies.  CURRENT MEDICATIONS 1. Lipitor 20 mg once daily. 2. Naproxen 375 mg once daily. 3. Prinzide 20 mg one daily. 4. Allopurinol 300 mg daily. 5. Glucosamine chondroitin four tablets daily.  PAST MEDICAL HISTORY:  Past medical history is significant for elevated cholesterol, hypertension, history of kidney stones.  PAST SURGICAL HISTORY:  Significant for treatment of an abdominal aortic aneurysm with a stent in April of 2001.  This was performed by Dr. Di Kindle. Edilia Bo.  Dr. Edilia Bo has sent a letter of medical clearance from a cardiology standpoint for Christian Wiley.  Other  surgical history includes arthroscopy to the right knee x 2, arthroscopy to the left knee x 1.  He is status post cholecystectomy and status post right cataract removal with lens implant.  PRIMARY CARE PHYSICIAN:  His family medical doctor is Dr. Gerlene Burdock A. Jacky Kindle.  SOCIAL HISTORY:  Patient lives with his wife.  He has no intake of alcohol. He has been a nonsmoker for 25 years.  He lives in a one-level home and is independent with activities of daily living.  They have two steps into the usual entrance of the home.  FAMILY HISTORY:  Family history is significant for his mother and father both suffering from high blood pressure.  His father had kidney disease and he had a sister with breast cancer.  REVIEW OF SYSTEMS:  CNS:  Patient denies blurred vision, double vision, seizure disorder, headaches or paralysis.  He does state he has a cataract to the left eye which will eventually need to be removed, with lens implant as well.  CARDIORESPIRATORY:  No chest pain, shortness of breath, cough or sputum production or hemoptysis.  GU/GI:  No nausea, vomiting, diarrhea, constipation.  No dysuria or hematuria, melena or bloody stools.  He has had no kidney stones for the past 20 years and states that he has been using allopurinol for 20 years to prevent formation of uric acid to prevent further kidney stones.  MUSCULOSKELETAL:  As per history of present illness. HEMATOLOGIC:  Patient denies history of bleeding disorders, blood clots, anemia, jaundice or hepatitis.  PHYSICAL EXAMINATION  GENERAL:  Patient is a well-developed, well-nourished white male, alert and oriented x 4.  Today, he is accompanied by his daughter and wife.  VITAL SIGNS:  Patient is afebrile with pulse 70 and regular, blood pressure 138/78.  HEENT:  Normocephalic, atraumatic.  Pupils equal, round and reactive to light and accommodation.  Extraocular movements intact.  Nose without drainage. Oropharynx without edema or  erythema.  NECK:  Supple.  No adenopathy.  No bruits heard.  LUNGS:  Clear to auscultation.  HEART:  Regular rate and rhythm.  No murmur heard.  ABDOMEN:  Soft and nontender.  Bowel sounds are present.  GENITAL:  Exam not performed, not pertinent to present illness.  RECTAL:  Exam not performed, not pertinent to present illness.  BREASTS:  Exam not performed, not pertinent to present illness.  EXTREMITIES:  Patient has good sensation throughout the lower extremities. Distal pulses are trace at dorsalis pedis bilaterally.  There is no cyanosis, clubbing or edema of the lower extremities.  Examination of the right knee reveals a varus malalignment.  Range of motion is 3 degrees to 110 degrees with diffuse tenderness on range of motion.  He has no palpable effusion. The left knee reveals no effusion, with range of motion causing discomfort.  IMPRESSION 1. End-stage osteoarthritis, bilateral knees, right greater than left. 2. Status post stent for abdominal aortic aneurysm, April of 2001. 3. Status post cataract removal with lens implant, right eye, June 2001. 4. Elevated cholesterol. 5. Hypertension. 6. History of kidney stones.  PLAN:  Patient is admitted to the hospital to undergo a right total knee replacement.  He has been cleared from a cardiology standpoint by Dr. Edilia Bo.  Dr. Pearletha Furl. Jacky Kindle, his family medical doctor, will be asked to assist with any medical concerns during the hospital stay.  DD:  07/31/00 TD:  08/01/00 Job: [id number]

## 2011-11-29 ENCOUNTER — Other Ambulatory Visit: Payer: Self-pay | Admitting: Neurosurgery

## 2011-11-29 DIAGNOSIS — M47816 Spondylosis without myelopathy or radiculopathy, lumbar region: Secondary | ICD-10-CM

## 2011-12-09 ENCOUNTER — Other Ambulatory Visit: Payer: Self-pay

## 2011-12-12 ENCOUNTER — Ambulatory Visit
Admission: RE | Admit: 2011-12-12 | Discharge: 2011-12-12 | Disposition: A | Discharge status: Home / Self Care | Payer: Medicare Other | Source: Ambulatory Visit | Attending: Neurosurgery | Admitting: Neurosurgery

## 2011-12-12 DIAGNOSIS — M47816 Spondylosis without myelopathy or radiculopathy, lumbar region: Secondary | ICD-10-CM

## 2011-12-19 ENCOUNTER — Other Ambulatory Visit: Payer: Self-pay | Admitting: Neurosurgery

## 2011-12-19 DIAGNOSIS — M4716 Other spondylosis with myelopathy, lumbar region: Secondary | ICD-10-CM

## 2011-12-20 ENCOUNTER — Ambulatory Visit
Admission: RE | Admit: 2011-12-20 | Discharge: 2011-12-20 | Disposition: A | Discharge status: Home / Self Care | Payer: Medicare Other | Source: Ambulatory Visit | Attending: Neurosurgery | Admitting: Neurosurgery

## 2011-12-20 DIAGNOSIS — M4716 Other spondylosis with myelopathy, lumbar region: Secondary | ICD-10-CM

## 2011-12-20 MED ORDER — IOHEXOL 180 MG/ML  SOLN
1.0000 mL | Freq: Once | INTRAMUSCULAR | Status: AC | PRN
  Administered 2011-12-20: 1 mL via EPIDURAL

## 2011-12-20 MED ORDER — METHYLPREDNISOLONE ACETATE 40 MG/ML INJ SUSP (RADIOLOG
120.0000 mg | Freq: Once | INTRAMUSCULAR | Status: AC
  Administered 2011-12-20: 120 mg via EPIDURAL

## 2012-01-13 ENCOUNTER — Encounter: Payer: Self-pay | Admitting: Vascular Surgery

## 2012-01-14 ENCOUNTER — Encounter: Payer: Self-pay | Admitting: Vascular Surgery

## 2012-01-15 ENCOUNTER — Encounter (INDEPENDENT_AMBULATORY_CARE_PROVIDER_SITE_OTHER): Payer: Medicare Other | Admitting: *Deleted

## 2012-01-15 ENCOUNTER — Ambulatory Visit (INDEPENDENT_AMBULATORY_CARE_PROVIDER_SITE_OTHER): Payer: Medicare Other | Admitting: Vascular Surgery

## 2012-01-15 ENCOUNTER — Encounter: Payer: Self-pay | Admitting: Vascular Surgery

## 2012-01-15 VITALS — BP 131/77 | HR 52 | Resp 16 | Ht 72.0 in | Wt 244.0 lb

## 2012-01-15 DIAGNOSIS — I714 Abdominal aortic aneurysm, without rupture, unspecified: Secondary | ICD-10-CM

## 2012-01-15 DIAGNOSIS — I70309 Unspecified atherosclerosis of unspecified type of bypass graft(s) of the extremities, unspecified extremity: Secondary | ICD-10-CM

## 2012-01-15 DIAGNOSIS — Z48812 Encounter for surgical aftercare following surgery on the circulatory system: Secondary | ICD-10-CM

## 2012-01-15 NOTE — Progress Notes (Signed)
[facility] [place]  Patient name: Christian Wiley MRN: [id number] DOB: 1935-03-26 Sex: male  REASON FOR VISIT: follow up of the EVAR.  HPI: Christian Wiley is a 77 y.o. male who underwent endovascular repair of an aneurysm in 2001. He comes in for a routine follow up visit with the duplex scan today. He has had some back pain and has undergone lower lumbar surgery. He's had no abdominal pain or new onset back pain.  Past Medical History  Diagnosis Date  . Arthritis   . AAA (abdominal aortic aneurysm)   . Hx of hiatal hernia   . GERD (gastroesophageal reflux disease)   . Hypertension   . Hyperlipidemia   . Sigmoid diverticulosis     Family History  Problem Relation Age of Onset  . Cancer Sister   . Hyperlipidemia Brother   . Hypertension Daughter     SOCIAL HISTORY: History  Substance Use Topics  . Smoking status: Former Games developer  . Smokeless tobacco: Not on file  . Alcohol Use: No    No Known Allergies  Current Outpatient Prescriptions  Medication Sig Dispense Refill  . allopurinol (ZYLOPRIM) 300 MG tablet Take 300 mg by mouth daily.      Marland Kitchen aspirin 81 MG tablet Take 81 mg by mouth daily.      . Calcium Carbonate ([facility] CALCIUM) 1500 MG TABS Take by mouth.      . citalopram (CELEXA) 20 MG tablet Take 20 mg by mouth daily.      . finasteride (PROSCAR) 5 MG tablet Take 5 mg by mouth daily.      Marland Kitchen lisinopril-hydrochlorothiazide (PRINZIDE,ZESTORETIC) 20-12.5 MG per tablet Take 1 tablet by mouth daily.      Marland Kitchen VITAMIN D, CHOLECALCIFEROL, PO Take by mouth.      Marland Kitchen alendronate (FOSAMAX) 70 MG tablet Take 70 mg by mouth every 7 (seven) days. Take with a full glass of water on an empty stomach.      . calcitonin, salmon, (MIACALCIN/FORTICAL) 200 UNIT/ACT nasal spray Place 1 spray into the nose daily.      Marland Kitchen ezetimibe-simvastatin (VYTORIN) 10-40 MG per tablet Take 1 tablet by mouth at bedtime.      Marland Kitchen omeprazole-sodium bicarbonate (ZEGERID) 40-1100 MG per  capsule Take 1 capsule by mouth daily before breakfast.      . pregabalin (LYRICA) 150 MG capsule Take 150 mg by mouth daily.        REVIEW OF SYSTEMS: Arly.Keller ] denotes positive finding; [  ] denotes negative finding  CARDIOVASCULAR:  [ ]  chest pain   [ ]  chest pressure   [ ]  palpitations   [ ]  orthopnea   [ ]  dyspnea on exertion   [ ]  claudication   [ ]  rest pain   [ ]  DVT   [ ]  phlebitis PULMONARY:   [ ]  productive cough   [ ]  asthma   [ ]  wheezing NEUROLOGIC:   Arly.Keller ] weakness both legs, which he relates to his disc disease. Arly.Keller ] paresthesias both legs, which she relates to his disc disease.  [ ]  aphasia  [ ]  amaurosis  [ ]  dizziness HEMATOLOGIC:   [ ]  bleeding problems   [ ]  clotting disorders MUSCULOSKELETAL:  [ ]  joint pain   [ ]  joint swelling [ ]  leg swelling GASTROINTESTINAL: [ ]   blood in stool  [ ]   hematemesis GENITOURINARY:  [ ]   dysuria  [ ]   hematuria PSYCHIATRIC:  [ ]  history  of major depression INTEGUMENTARY:  [ ]  rashes  [ ]  ulcers CONSTITUTIONAL:  [ ]  fever   [ ]  chills  PHYSICAL EXAM: Filed Vitals:   01/15/12 0959  BP: 131/77  Pulse: 52  Resp: 16  Height: 6' (1.829 m)  Weight: 244 lb (110.678 kg)  SpO2: 97%   Body mass index is 33.09 kg/(m^2). GENERAL: The patient is a well-nourished male, in no acute distress. The vital signs are documented above. CARDIOVASCULAR: There is a regular rate and rhythm without significant murmur appreciated. I do not detect carotid bruits. PULMONARY: There is good air exchange bilaterally without wheezing or rales. ABDOMEN: Soft and non-tender with normal pitched bowel sounds.  MUSCULOSKELETAL: There are no major deformities or cyanosis. NEUROLOGIC: No focal weakness or paresthesias are detected. SKIN: There are no ulcers or rashes noted. PSYCHIATRIC: The patient has a normal affect.  DATA:  I have independently interpreted his duplex of his EVAR shows the maximum diameter is 4.8 cm. His previous study showed the aneurysm was 4.9 cm.  This has been no change in the size of his aneurysm. The Endo graft is patent and there is no evidence of endoleak by duplex.  MEDICAL ISSUES:  Abdominal aneurysm without mention of rupture This patient underwent EVAR in 2001 for his abdominal aortic aneurysm. He comes in for a yearly follow up visit. This was done with an AneuRx graft. He remains asymptomatic and has had no specific complaints. By duplex his aneurysm has remained stable in size with no evidence of endoleak. He is now 11 years out. I'll stretches follow up out to 18 month intervals. I've ordered a duplex of his graft at that time and I'll see him back then. He knows to call sooner for has problems.    Rhondalyn Clingan S [facility] [place] Beeper: [phone]

## 2012-01-15 NOTE — Assessment & Plan Note (Signed)
This patient underwent EVAR in 2001 for his abdominal aortic aneurysm. He comes in for a yearly follow up visit. This was done with an AneuRx graft. He remains asymptomatic and has had no specific complaints. By duplex his aneurysm has remained stable in size with no evidence of endoleak. He is now 11 years out. I'll stretches follow up out to 18 month intervals. I've ordered a duplex of his graft at that time and I'll see him back then. He knows to call sooner for has problems.

## 2012-01-22 NOTE — Procedures (Unsigned)
VASCULAR LAB EXAM  INDICATION:  Followup endovascular stent graft.  HISTORY: Diabetes:  No Cardiac:  No Hypertension:  Yes  EXAM:  Previous sac size 12/26/2010 4.90 cm AP, 4.83 cm transverse. Today's sac size 01/15/2012 4.84 cm AP x 4.97 cm transverse.  IMPRESSION: 1. The aorta and endograft appear patent. 2. Stable sac size. 3. No evidence of endoleak was detected.  ___________________________________________ Di Kindle. Edilia Bo, M.D.  SS/MEDQ  D:  01/15/2012  T:  01/15/2012  Job:  [id number]

## 2012-05-20 ENCOUNTER — Other Ambulatory Visit: Payer: Self-pay | Admitting: Neurosurgery

## 2012-05-20 DIAGNOSIS — M47816 Spondylosis without myelopathy or radiculopathy, lumbar region: Secondary | ICD-10-CM

## 2012-05-28 ENCOUNTER — Ambulatory Visit
Admission: RE | Admit: 2012-05-28 | Discharge: 2012-05-28 | Disposition: A | Discharge status: Home / Self Care | Payer: Medicare Other | Source: Ambulatory Visit | Attending: Neurosurgery | Admitting: Neurosurgery

## 2012-05-28 VITALS — BP 156/74 | HR 52

## 2012-05-28 DIAGNOSIS — M47816 Spondylosis without myelopathy or radiculopathy, lumbar region: Secondary | ICD-10-CM

## 2012-05-28 MED ORDER — IOHEXOL 180 MG/ML  SOLN
1.0000 mL | Freq: Once | INTRAMUSCULAR | Status: AC | PRN
  Administered 2012-05-28: 1 mL via EPIDURAL

## 2012-05-28 MED ORDER — METHYLPREDNISOLONE ACETATE 40 MG/ML INJ SUSP (RADIOLOG
120.0000 mg | Freq: Once | INTRAMUSCULAR | Status: AC
  Administered 2012-05-28: 120 mg via EPIDURAL

## 2012-05-28 NOTE — Discharge Instructions (Signed)

## 2013-04-13 ENCOUNTER — Other Ambulatory Visit: Payer: Self-pay | Admitting: Neurosurgery

## 2013-04-13 DIAGNOSIS — M4716 Other spondylosis with myelopathy, lumbar region: Secondary | ICD-10-CM

## 2013-04-17 ENCOUNTER — Ambulatory Visit
Admission: RE | Admit: 2013-04-17 | Discharge: 2013-04-17 | Disposition: A | Discharge status: Home / Self Care | Payer: Medicare Other | Source: Ambulatory Visit | Attending: Neurosurgery | Admitting: Neurosurgery

## 2013-04-17 DIAGNOSIS — M4716 Other spondylosis with myelopathy, lumbar region: Secondary | ICD-10-CM

## 2013-04-21 ENCOUNTER — Other Ambulatory Visit: Payer: Self-pay | Admitting: Neurosurgery

## 2013-04-21 DIAGNOSIS — M4716 Other spondylosis with myelopathy, lumbar region: Secondary | ICD-10-CM

## 2013-04-22 ENCOUNTER — Ambulatory Visit
Admission: RE | Admit: 2013-04-22 | Discharge: 2013-04-22 | Disposition: A | Discharge status: Home / Self Care | Payer: Medicare Other | Source: Ambulatory Visit | Attending: Neurosurgery | Admitting: Neurosurgery

## 2013-04-22 VITALS — BP 137/58 | HR 78

## 2013-04-22 DIAGNOSIS — M4716 Other spondylosis with myelopathy, lumbar region: Secondary | ICD-10-CM

## 2013-04-22 MED ORDER — IOHEXOL 180 MG/ML  SOLN
1.0000 mL | Freq: Once | INTRAMUSCULAR | Status: AC | PRN
  Administered 2013-04-22: 1 mL via EPIDURAL

## 2013-04-22 MED ORDER — METHYLPREDNISOLONE ACETATE 40 MG/ML INJ SUSP (RADIOLOG
120.0000 mg | Freq: Once | INTRAMUSCULAR | Status: AC
  Administered 2013-04-22: 120 mg via EPIDURAL

## 2013-05-13 ENCOUNTER — Other Ambulatory Visit: Payer: Self-pay | Admitting: Neurosurgery

## 2013-05-13 DIAGNOSIS — M4716 Other spondylosis with myelopathy, lumbar region: Secondary | ICD-10-CM

## 2013-05-31 ENCOUNTER — Ambulatory Visit
Admission: RE | Admit: 2013-05-31 | Discharge: 2013-05-31 | Disposition: A | Discharge status: Home / Self Care | Payer: Medicare Other | Source: Ambulatory Visit | Attending: Neurosurgery | Admitting: Neurosurgery

## 2013-05-31 DIAGNOSIS — M4716 Other spondylosis with myelopathy, lumbar region: Secondary | ICD-10-CM

## 2013-05-31 MED ORDER — METHYLPREDNISOLONE ACETATE 40 MG/ML INJ SUSP (RADIOLOG
120.0000 mg | Freq: Once | INTRAMUSCULAR | Status: AC
  Administered 2013-05-31: 120 mg via EPIDURAL

## 2013-05-31 MED ORDER — IOHEXOL 180 MG/ML  SOLN
1.0000 mL | Freq: Once | INTRAMUSCULAR | Status: AC | PRN
  Administered 2013-05-31: 1 mL via EPIDURAL

## 2013-07-13 ENCOUNTER — Encounter: Payer: Self-pay | Admitting: Vascular Surgery

## 2013-07-14 ENCOUNTER — Encounter: Payer: Self-pay | Admitting: Vascular Surgery

## 2013-07-14 ENCOUNTER — Encounter (INDEPENDENT_AMBULATORY_CARE_PROVIDER_SITE_OTHER): Payer: Medicare Other | Admitting: *Deleted

## 2013-07-14 ENCOUNTER — Ambulatory Visit (INDEPENDENT_AMBULATORY_CARE_PROVIDER_SITE_OTHER): Payer: Medicare Other | Admitting: Vascular Surgery

## 2013-07-14 VITALS — BP 145/79 | HR 53 | Ht 72.0 in | Wt 239.0 lb

## 2013-07-14 DIAGNOSIS — I714 Abdominal aortic aneurysm, without rupture: Secondary | ICD-10-CM

## 2013-07-14 DIAGNOSIS — Z48812 Encounter for surgical aftercare following surgery on the circulatory system: Secondary | ICD-10-CM

## 2013-07-14 NOTE — Assessment & Plan Note (Signed)
The patient is doing well status post EVAR for 2001. The aneurysm is stable in size of 4.8 cm. He is not a smoker. His blood pressure is well-controlled. I've ordered a follow up ultrasound in 18 months and we will see him back at that time. He knows to call sooner if he has problems.

## 2013-07-14 NOTE — Progress Notes (Signed)
[facility]  Patient name: Christian Wiley MRN: [id number] DOB: 11/04/1935 Sex: male  REASON FOR VISIT: follow up after EVAR  HPI: Christian Wiley is a 78 y.o. male who underwent EVAR in 2001. This was for a 4.8 cm infrarenal aneurysm. He comes in for an 18 month follow up visit. He does have chronic low back pain related to lumbar disc disease. He is followed by Dr. Channing Mutters. He denies abdominal pain. There've been no significant changes in his medical history. He is not a smoker. His blood pressure has been well controlled.  Past Medical History  Diagnosis Date  . Arthritis   . AAA (abdominal aortic aneurysm)   . Hx of hiatal hernia   . GERD (gastroesophageal reflux disease)   . Hypertension   . Hyperlipidemia   . Sigmoid diverticulosis    Family History  Problem Relation Age of Onset  . Cancer Sister   . Hyperlipidemia Brother   . Hypertension Daughter    SOCIAL HISTORY: History  Substance Use Topics  . Smoking status: Former Games developer  . Smokeless tobacco: Not on file  . Alcohol Use: No   No Known Allergies  REVIEW OF SYSTEMS: Christian Wiley ] denotes positive finding; [  ] denotes negative finding  CARDIOVASCULAR:  [ ]  chest pain   [ ]  chest pressure   [ ]  palpitations   [ ]  orthopnea   Christian Wiley ] dyspnea on exertion   [ ]  claudication   [ ]  rest pain   [ ]  DVT   [ ]  phlebitis PULMONARY:   [ ]  productive cough   [ ]  asthma   [ ]  wheezing NEUROLOGIC:   [ ]  weakness  [ ]  paresthesias  [ ]  aphasia  [ ]  amaurosis  [ ]  dizziness HEMATOLOGIC:   [ ]  bleeding problems   [ ]  clotting disorders MUSCULOSKELETAL:  Christian Wiley ] joint pain   [ ]  joint swelling [ ]  leg swelling GASTROINTESTINAL: [ ]   blood in stool  [ ]   hematemesis GENITOURINARY:  [ ]   dysuria  [ ]   hematuria PSYCHIATRIC:  [ ]  history of major depression INTEGUMENTARY:  [ ]  rashes  [ ]  ulcers CONSTITUTIONAL:  [ ]  fever   [ ]  chills  PHYSICAL EXAM: Filed Vitals:   07/14/13 0933  BP: 145/79  Pulse: 53  Height:  6' (1.829 m)  Weight: 239 lb (108.41 kg)  SpO2: 98%   Body mass index is 32.41 kg/(m^2). GENERAL: The patient is a well-nourished male, in no acute distress. The vital signs are documented above. CARDIOVASCULAR: There is a regular rate and rhythm. I do not detect carotid bruits. He has palpable femoral pulses. I cannot palpate pedal pulses. Both feet are warm and well-perfused. He has no significant lower extremity swelling. PULMONARY: There is good air exchange bilaterally without wheezing or rales. ABDOMEN: Soft and non-tender with normal pitched bowel sounds.  MUSCULOSKELETAL: There are no major deformities or cyanosis. NEUROLOGIC: No focal weakness or paresthesias are detected. SKIN: There are no ulcers or rashes noted. PSYCHIATRIC: The patient has a normal affect.  DATA:  I have independently interpreted his ultrasound of his Nilda Riggs which shows that the maximum diameter of his aorta is 4.8 cm. There is been no change in his aneurysm over the last 18 months.  MEDICAL ISSUES:  Abdominal aneurysm without mention of rupture The patient is doing well status post EVAR for 2001. The aneurysm is stable in size of 4.8 cm. He is  not a smoker. His blood pressure is well-controlled. I've ordered a follow up ultrasound in 18 months and we will see him back at that time. He knows to call sooner if he has problems.   Vikram Tillett S [facility] [place] Beeper: [phone]

## 2013-07-15 NOTE — Addendum Note (Signed)
Addended by: Sharee Pimple on: 07/15/2013 08:32 AM   Modules accepted: Orders

## 2013-07-16 ENCOUNTER — Other Ambulatory Visit: Payer: Self-pay | Admitting: Neurosurgery

## 2013-07-16 DIAGNOSIS — M4716 Other spondylosis with myelopathy, lumbar region: Secondary | ICD-10-CM

## 2013-07-23 ENCOUNTER — Ambulatory Visit
Admission: RE | Admit: 2013-07-23 | Discharge: 2013-07-23 | Disposition: A | Discharge status: Home / Self Care | Payer: Medicare Other | Source: Ambulatory Visit | Attending: Neurosurgery | Admitting: Neurosurgery

## 2013-07-23 VITALS — BP 144/76 | HR 51

## 2013-07-23 DIAGNOSIS — M4716 Other spondylosis with myelopathy, lumbar region: Secondary | ICD-10-CM

## 2013-07-23 MED ORDER — IOHEXOL 180 MG/ML  SOLN
1.0000 mL | Freq: Once | INTRAMUSCULAR | Status: AC | PRN
  Administered 2013-07-23: 1 mL via EPIDURAL

## 2013-07-23 MED ORDER — METHYLPREDNISOLONE ACETATE 40 MG/ML INJ SUSP (RADIOLOG
120.0000 mg | Freq: Once | INTRAMUSCULAR | Status: AC
  Administered 2013-07-23: 120 mg via EPIDURAL

## 2013-10-01 ENCOUNTER — Emergency Department (HOSPITAL_COMMUNITY): Payer: Medicare Other

## 2013-10-01 ENCOUNTER — Inpatient Hospital Stay (HOSPITAL_COMMUNITY)
Admission: EM | Admit: 2013-10-01 | Discharge: 2013-10-04 | DRG: 176 | Disposition: A | Discharge status: Home / Self Care | Payer: Medicare Other | Attending: Internal Medicine | Admitting: Internal Medicine

## 2013-10-01 ENCOUNTER — Encounter (HOSPITAL_COMMUNITY): Payer: Self-pay | Admitting: Emergency Medicine

## 2013-10-01 DIAGNOSIS — I10 Essential (primary) hypertension: Secondary | ICD-10-CM | POA: Diagnosis present

## 2013-10-01 DIAGNOSIS — E785 Hyperlipidemia, unspecified: Secondary | ICD-10-CM | POA: Diagnosis present

## 2013-10-01 DIAGNOSIS — I714 Abdominal aortic aneurysm, without rupture, unspecified: Secondary | ICD-10-CM | POA: Diagnosis present

## 2013-10-01 DIAGNOSIS — K219 Gastro-esophageal reflux disease without esophagitis: Secondary | ICD-10-CM | POA: Diagnosis present

## 2013-10-01 DIAGNOSIS — Z8719 Personal history of other diseases of the digestive system: Secondary | ICD-10-CM

## 2013-10-01 DIAGNOSIS — G4700 Insomnia, unspecified: Secondary | ICD-10-CM | POA: Diagnosis present

## 2013-10-01 DIAGNOSIS — D696 Thrombocytopenia, unspecified: Secondary | ICD-10-CM | POA: Diagnosis present

## 2013-10-01 DIAGNOSIS — I2699 Other pulmonary embolism without acute cor pulmonale: Principal | ICD-10-CM | POA: Diagnosis present

## 2013-10-01 DIAGNOSIS — Z86718 Personal history of other venous thrombosis and embolism: Secondary | ICD-10-CM

## 2013-10-01 DIAGNOSIS — Z96659 Presence of unspecified artificial knee joint: Secondary | ICD-10-CM

## 2013-10-01 LAB — APTT: aPTT: 30 seconds (ref 24–37)

## 2013-10-01 LAB — CBC WITH DIFFERENTIAL/PLATELET
Basophils Absolute: 0 10*3/uL (ref 0.0–0.1)
Basophils Relative: 0 % (ref 0–1)
Eosinophils Absolute: 0.1 10*3/uL (ref 0.0–0.7)
Lymphs Abs: 2.7 10*3/uL (ref 0.7–4.0)
MCH: 31.8 pg (ref 26.0–34.0)
Neutrophils Relative %: 51 % (ref 43–77)
Platelets: 127 10*3/uL — ABNORMAL LOW (ref 150–400)
RBC: 4.28 MIL/uL (ref 4.22–5.81)

## 2013-10-01 LAB — COMPREHENSIVE METABOLIC PANEL
ALT: 16 U/L (ref 0–53)
AST: 24 U/L (ref 0–37)
Albumin: 3.8 g/dL (ref 3.5–5.2)
Alkaline Phosphatase: 130 U/L — ABNORMAL HIGH (ref 39–117)
Glucose, Bld: 101 mg/dL — ABNORMAL HIGH (ref 70–99)
Potassium: 3.7 mEq/L (ref 3.5–5.1)
Sodium: 141 mEq/L (ref 135–145)
Total Protein: 6.8 g/dL (ref 6.0–8.3)

## 2013-10-01 LAB — PRO B NATRIURETIC PEPTIDE: Pro B Natriuretic peptide (BNP): 252 pg/mL (ref 0–450)

## 2013-10-01 MED ORDER — HYDROCODONE-ACETAMINOPHEN 5-325 MG PO TABS
1.0000 | ORAL_TABLET | Freq: Four times a day (QID) | ORAL | Status: DC | PRN
  Administered 2013-10-03: 1 via ORAL
  Filled 2013-10-01: qty 1

## 2013-10-01 MED ORDER — CITALOPRAM HYDROBROMIDE 20 MG PO TABS
20.0000 mg | ORAL_TABLET | Freq: Every day | ORAL | Status: DC
  Administered 2013-10-02 – 2013-10-04 (×3): 20 mg via ORAL
  Filled 2013-10-01 (×3): qty 1

## 2013-10-01 MED ORDER — ALLOPURINOL 300 MG PO TABS
300.0000 mg | ORAL_TABLET | Freq: Every day | ORAL | Status: DC
  Administered 2013-10-02 – 2013-10-04 (×3): 300 mg via ORAL
  Filled 2013-10-01 (×3): qty 1

## 2013-10-01 MED ORDER — NEBIVOLOL HCL 5 MG PO TABS
5.0000 mg | ORAL_TABLET | Freq: Every day | ORAL | Status: DC
  Administered 2013-10-02 – 2013-10-04 (×3): 5 mg via ORAL
  Filled 2013-10-01 (×3): qty 1

## 2013-10-01 MED ORDER — SODIUM CHLORIDE 0.9 % IJ SOLN
3.0000 mL | Freq: Two times a day (BID) | INTRAMUSCULAR | Status: DC
  Administered 2013-10-02 – 2013-10-03 (×2): 3 mL via INTRAVENOUS

## 2013-10-01 MED ORDER — PROMETHAZINE HCL 25 MG PO TABS: 12.5000 mg | ORAL_TABLET | Freq: Four times a day (QID) | ORAL | Status: DC | PRN

## 2013-10-01 MED ORDER — SENNOSIDES-DOCUSATE SODIUM 8.6-50 MG PO TABS: 1.0000 | ORAL_TABLET | Freq: Every evening | ORAL | Status: DC | PRN

## 2013-10-01 MED ORDER — ATORVASTATIN CALCIUM 40 MG PO TABS
40.0000 mg | ORAL_TABLET | Freq: Every day | ORAL | Status: DC
  Administered 2013-10-02 – 2013-10-04 (×4): 40 mg via ORAL
  Filled 2013-10-01 (×4): qty 1

## 2013-10-01 MED ORDER — SODIUM CHLORIDE 0.9 % IV SOLN
INTRAVENOUS | Status: DC
  Administered 2013-10-01: 17:00:00 via INTRAVENOUS

## 2013-10-01 MED ORDER — ASPIRIN 81 MG PO CHEW
81.0000 mg | CHEWABLE_TABLET | Freq: Every day | ORAL | Status: DC
  Administered 2013-10-02 – 2013-10-03 (×3): 81 mg via ORAL
  Filled 2013-10-01 (×4): qty 1

## 2013-10-01 MED ORDER — CENTRUM PO CHEW: 1.0000 | CHEWABLE_TABLET | Freq: Every day | ORAL | Status: DC

## 2013-10-01 MED ORDER — HEPARIN (PORCINE) IN NACL 100-0.45 UNIT/ML-% IJ SOLN: 16.0000 [IU]/kg/h | INTRAMUSCULAR | Status: DC

## 2013-10-01 MED ORDER — HEPARIN (PORCINE) IN NACL 100-0.45 UNIT/ML-% IJ SOLN
1450.0000 [IU]/h | INTRAMUSCULAR | Status: DC
  Administered 2013-10-01: 1450 [IU]/h via INTRAVENOUS
  Filled 2013-10-01: qty 250

## 2013-10-01 MED ORDER — ACETAMINOPHEN 325 MG PO TABS: 650.0000 mg | ORAL_TABLET | Freq: Four times a day (QID) | ORAL | Status: DC | PRN

## 2013-10-01 MED ORDER — IOHEXOL 350 MG/ML SOLN
100.0000 mL | Freq: Once | INTRAVENOUS | Status: AC | PRN
  Administered 2013-10-01: 100 mL via INTRAVENOUS

## 2013-10-01 MED ORDER — ADULT MULTIVITAMIN W/MINERALS CH
1.0000 | ORAL_TABLET | Freq: Every day | ORAL | Status: DC
  Administered 2013-10-02 – 2013-10-04 (×3): 1 via ORAL
  Filled 2013-10-01 (×3): qty 1

## 2013-10-01 MED ORDER — ALPRAZOLAM 0.5 MG PO TABS: 0.5000 mg | ORAL_TABLET | Freq: Every evening | ORAL | Status: DC | PRN

## 2013-10-01 MED ORDER — ALUM & MAG HYDROXIDE-SIMETH 200-200-20 MG/5ML PO SUSP: 30.0000 mL | Freq: Four times a day (QID) | ORAL | Status: DC | PRN

## 2013-10-01 MED ORDER — ZOLPIDEM TARTRATE 5 MG PO TABS
5.0000 mg | ORAL_TABLET | Freq: Every evening | ORAL | Status: DC | PRN
  Filled 2013-10-01: qty 1

## 2013-10-01 MED ORDER — HEPARIN BOLUS VIA INFUSION
5000.0000 [IU] | Freq: Once | INTRAVENOUS | Status: AC
  Administered 2013-10-01: 5000 [IU] via INTRAVENOUS
  Filled 2013-10-01: qty 5000

## 2013-10-01 MED ORDER — MELATONIN 10 MG PO CAPS: 20.0000 mg | ORAL_CAPSULE | Freq: Every day | ORAL | Status: DC

## 2013-10-01 MED ORDER — ACETAMINOPHEN 650 MG RE SUPP: 650.0000 mg | Freq: Four times a day (QID) | RECTAL | Status: DC | PRN

## 2013-10-01 MED ORDER — SORBITOL 70 % SOLN
30.0000 mL | Freq: Every day | Status: DC | PRN
  Filled 2013-10-01: qty 30

## 2013-10-01 NOTE — Progress Notes (Signed)
PHARMACIST - PHYSICIAN ORDER COMMUNICATION  CONCERNING: P&T Medication Policy on Herbal Medications  DESCRIPTION:  This patient's order for:  Melatonin  has been noted.  This product(s) is classified as an "herbal" or natural product. Due to a lack of definitive safety studies or FDA approval, nonstandard manufacturing practices, plus the potential risk of unknown drug-drug interactions while on inpatient medications, the Pharmacy and Therapeutics Committee does not permit the use of "herbal" or natural products of this type within [facility].   ACTION TAKEN: The pharmacy department is unable to verify this order at this time and your patient has been informed of this safety policy. Please reevaluate patient's clinical condition at discharge and address if the herbal or natural product(s) should be resumed at that time.  Thank you,  Kadeem Hyle, PharmD 

## 2013-10-01 NOTE — ED Provider Notes (Signed)
CSN: [id number]     Arrival date & time 10/01/13  1631 History   First MD Initiated Contact with Patient 10/01/13 1639     No chief complaint on file.  (Consider location/radiation/quality/duration/timing/severity/associated sxs/prior Treatment) The history is provided by the patient and the spouse.   Patient here complaining of a two-day history of dyspnea on exertion. Denies any chest pain or chest pressure. No cough or fever or chills. Does have a history of pulmonary embolism secondary to her left lower extremity DVT in the past. Does not take any anticoagulants at this time. Symptoms are improved with rest. No treatment used prior to arrival. Denies any lower extremity pain or swelling. No orthopnea noted. No recent travel history or immobility Past Medical History  Diagnosis Date  . Arthritis   . AAA (abdominal aortic aneurysm)   . Hx of hiatal hernia   . GERD (gastroesophageal reflux disease)   . Hypertension   . Hyperlipidemia   . Sigmoid diverticulosis    Past Surgical History  Procedure Laterality Date  . Abdominal aortic aneurysm repair  2001    endovascular repair  . Cholecystectomy    . Joint replacement      bilateral knee replacement   Family History  Problem Relation Age of Onset  . Cancer Sister   . Hyperlipidemia Brother   . Hypertension Daughter    History  Substance Use Topics  . Smoking status: Former Games developer  . Smokeless tobacco: Not on file  . Alcohol Use: No    Review of Systems  All other systems reviewed and are negative.    Allergies  Review of patient's allergies indicates no known allergies.  Home Medications   Current Outpatient Rx  Name  Route  Sig  Dispense  Refill  . alendronate (FOSAMAX) 70 MG tablet   Oral   Take 70 mg by mouth every 7 (seven) days. Take with a full glass of water on an empty stomach.         Marland Kitchen allopurinol (ZYLOPRIM) 300 MG tablet   Oral   Take 300 mg by mouth daily.         Marland Kitchen aspirin 81 MG tablet  Oral   Take 81 mg by mouth daily.         Marland Kitchen atorvastatin (LIPITOR) 40 MG tablet   Oral   Take 1 tablet by mouth daily.         . calcitonin, salmon, (MIACALCIN/FORTICAL) 200 UNIT/ACT nasal spray   Nasal   Place 1 spray into the nose daily.         . Calcium Carbonate ([facility] CALCIUM) 1500 MG TABS   Oral   Take by mouth.         . citalopram (CELEXA) 20 MG tablet   Oral   Take 20 mg by mouth daily.         Marland Kitchen ezetimibe-simvastatin (VYTORIN) 10-40 MG per tablet   Oral   Take 1 tablet by mouth at bedtime.         . finasteride (PROSCAR) 5 MG tablet   Oral   Take 5 mg by mouth daily.         Marland Kitchen HYDROcodone-acetaminophen (NORCO/VICODIN) 5-325 MG per tablet   Oral   Take 1 tablet by mouth as needed.         Marland Kitchen lisinopril-hydrochlorothiazide (PRINZIDE,ZESTORETIC) 20-12.5 MG per tablet   Oral   Take 1 tablet by mouth daily.         Marland Kitchen  meclizine (ANTIVERT) 25 MG tablet   Oral   Take 25 mg by mouth as needed.         . Melatonin 10 MG CAPS   Oral   Take 1 tablet by mouth at bedtime.         . multivitamin-iron-minerals-folic acid (CENTRUM) chewable tablet   Oral   Chew 1 tablet by mouth daily.         . nebivolol (BYSTOLIC) 5 MG tablet   Oral   Take 5 mg by mouth daily.         Marland Kitchen omeprazole-sodium bicarbonate (ZEGERID) 40-1100 MG per capsule   Oral   Take 1 capsule by mouth daily before breakfast.         . pregabalin (LYRICA) 150 MG capsule   Oral   Take 150 mg by mouth daily.         Marland Kitchen VITAMIN D, CHOLECALCIFEROL, PO   Oral   Take by mouth.          There were no vitals taken for this visit. Physical Exam  Nursing note and vitals reviewed. Constitutional: He is oriented to person, place, and time. He appears well-developed and well-nourished.  Non-toxic appearance. No distress.  HENT:  Head: Normocephalic and atraumatic.  Eyes: Conjunctivae, EOM and lids are normal. Pupils are equal, round, and reactive to light.  Neck: Normal  range of motion. Neck supple. No tracheal deviation present. No mass present.  Cardiovascular: Normal rate, regular rhythm and normal heart sounds.  Exam reveals no gallop.   No murmur heard. Pulmonary/Chest: Effort normal and breath sounds normal. No stridor. No respiratory distress. He has no decreased breath sounds. He has no wheezes. He has no rhonchi. He has no rales.  Abdominal: Soft. Normal appearance and bowel sounds are normal. He exhibits no distension. There is no tenderness. There is no rebound and no CVA tenderness.  Musculoskeletal: Normal range of motion. He exhibits no edema and no tenderness.  Neurological: He is alert and oriented to person, place, and time. He has normal strength. No cranial nerve deficit or sensory deficit. GCS eye subscore is 4. GCS verbal subscore is 5. GCS motor subscore is 6.  Skin: Skin is warm and dry. No abrasion and no rash noted.  Psychiatric: He has a normal mood and affect. His speech is normal and behavior is normal.    ED Course  Procedures (including critical care time) Labs Review Labs Reviewed - No data to display Imaging Review No results found.  EKG Interpretation     Ventricular Rate:  54 PR Interval:  174 QRS Duration: 121 QT Interval:  492 QTC Calculation: 466 R Axis:   27 Text Interpretation:  Sinus rhythm Right bundle branch block No significant change since last tracing            MDM  No diagnosis found.   Patient started on heparin drip per pharmacy. Will be admitted for treatment of pulmonary embolus  Toy Baker, MD 10/01/13 [phone]

## 2013-10-01 NOTE — Progress Notes (Signed)
Utilization Review completed.  Feliz Lincoln RN CM  

## 2013-10-01 NOTE — H&P (Signed)
Christian Wiley is an 78 y.o. male.   Chief Complaint: having difficulty breathing HPI:  The patient is a 78 year old Caucasian man with several medical problems, most notably a pulmonary embolism about 2 years ago associated with DVT. He was in his usual state of good health until the past week when he had gradually increasing dyspnea on exertion. Today he had to stop and rest because of fatigue and dyspnea with minimal activity, so he presented to the emergency room for evaluation. He had not had recent fever, chills, cough, chest pain, or palpitations. His previous pulmonary embolism was treated with heparin followed by Coumadin for over 6 months time. He has not had any recent prolonged car ride or airplane rides, and has not had any recent trauma to his lower extremities.  Past Medical History  Diagnosis Date  . Arthritis   . AAA (abdominal aortic aneurysm)   . Hx of hiatal hernia   . GERD (gastroesophageal reflux disease)   . Hypertension   . Hyperlipidemia   . Sigmoid diverticulosis   . Pulmonary embolism 2011     (Not in a hospital admission)  ADDITIONAL HOME MEDICATIONS: no additional meds  PHYSICIANS INVOLVED IN CARE: Geoffry Paradise (PCP)  Past Surgical History  Procedure Laterality Date  . Abdominal aortic aneurysm repair  2001    endovascular repair  . Cholecystectomy    . Joint replacement      bilateral knee replacement  . Rotator cuff repair      bilateral  . Back surgery      laminectomy  . Back surgery      Family History  Problem Relation Age of Onset  . Cancer Sister   . Hyperlipidemia Brother   . Hypertension Daughter      Social History:  reports that he quit smoking about 39 years ago. He has never used smokeless tobacco. He reports that he does not drink alcohol or use illicit drugs.He is married and lives with his wife.   Allergies: No Known Allergies   ROS: Blood problems, high blood pressure, shortness of breath and gout, high blood  pressure  PHYSICAL EXAM: Blood pressure 167/58, pulse 59, temperature 97.4 F (36.3 C), temperature source Oral, resp. rate 21, height 6' (1.829 m), weight 108.863 kg (240 lb), SpO2 100.00%. In general, the patient is an overweight white man who was in no apparent distress while lying supine. HEENT exam was within normal limits, neck was supple without jugular venous distention or carotid bruit, chest was clear to auscultation, heart had a regular rate and rhythm and was without significant murmur or gallop, abdomen had normal bowel sounds and no hepatosplenomegaly or tenderness, he had trace right leg edema and bilateral homans test negative and no palpable venous cords. His pedal pulses were normal. He was alert and well oriented with no focal neurological deficits.  Results for orders placed during the hospital encounter of 10/01/13 (from the past 48 hour(s))  CBC WITH DIFFERENTIAL     Status: Abnormal   Collection Time    10/01/13  5:00 PM      Result Value Range   WBC 6.9  4.0 - 10.5 K/uL   RBC 4.28  4.22 - 5.81 MIL/uL   Hemoglobin 13.6  13.0 - 17.0 g/dL   HCT 16.1  09.6 - 04.5 %   MCV 93.0  78.0 - 100.0 fL   MCH 31.8  26.0 - 34.0 pg   MCHC 34.2  30.0 - 36.0 g/dL  RDW 14.4  11.5 - 15.5 %   Platelets 127 (*) 150 - 400 K/uL   Neutrophils Relative % 51  43 - 77 %   Neutro Abs 3.5  1.7 - 7.7 K/uL   Lymphocytes Relative 39  12 - 46 %   Lymphs Abs 2.7  0.7 - 4.0 K/uL   Monocytes Relative 8  3 - 12 %   Monocytes Absolute 0.6  0.1 - 1.0 K/uL   Eosinophils Relative 1  0 - 5 %   Eosinophils Absolute 0.1  0.0 - 0.7 K/uL   Basophils Relative 0  0 - 1 %   Basophils Absolute 0.0  0.0 - 0.1 K/uL  COMPREHENSIVE METABOLIC PANEL     Status: Abnormal   Collection Time    10/01/13  5:00 PM      Result Value Range   Sodium 141  135 - 145 mEq/L   Potassium 3.7  3.5 - 5.1 mEq/L   Chloride 103  96 - 112 mEq/L   CO2 29  19 - 32 mEq/L   Glucose, Bld 101 (*) 70 - 99 mg/dL   BUN 13  6 - 23 mg/dL    Creatinine, Ser 1.61  0.50 - 1.35 mg/dL   Calcium 9.8  8.4 - 09.6 mg/dL   Total Protein 6.8  6.0 - 8.3 g/dL   Albumin 3.8  3.5 - 5.2 g/dL   AST 24  0 - 37 U/L   ALT 16  0 - 53 U/L   Alkaline Phosphatase 130 (*) 39 - 117 U/L   Total Bilirubin 0.8  0.3 - 1.2 mg/dL   GFR calc non Af Amer 78 (*) >90 mL/min   GFR calc Af Amer 90 (*) >90 mL/min   Comment: (NOTE)     The eGFR has been calculated using the CKD EPI equation.     This calculation has not been validated in all clinical situations.     eGFR's persistently <90 mL/min signify possible Chronic Kidney     Disease.  TROPONIN I     Status: None   Collection Time    10/01/13  5:00 PM      Result Value Range   Troponin I <0.30  <0.30 ng/mL   Comment:            Due to the release kinetics of cTnI,     a negative result within the first hours     of the onset of symptoms does not rule out     myocardial infarction with certainty.     If myocardial infarction is still suspected,     repeat the test at appropriate intervals.  PROTIME-INR     Status: None   Collection Time    10/01/13  5:00 PM      Result Value Range   Prothrombin Time 12.5  11.6 - 15.2 seconds   INR 0.95  0.00 - 1.49  APTT     Status: None   Collection Time    10/01/13  5:00 PM      Result Value Range   aPTT 30  24 - 37 seconds  PRO B NATRIURETIC PEPTIDE     Status: None   Collection Time    10/01/13  5:00 PM      Result Value Range   Pro B Natriuretic peptide (BNP) 252.0  0 - 450 pg/mL   Ct Angio Chest Pe W/cm &/or Wo Cm  10/01/2013   CLINICAL DATA:  Shortness of  breath on exertion  EXAM: CT ANGIOGRAPHY CHEST WITH CONTRAST  TECHNIQUE: Multidetector CT imaging of the chest was performed using the standard protocol during bolus administration of intravenous contrast. Multiplanar CT image reconstructions including MIPs were obtained to evaluate the vascular anatomy.  CONTRAST:  OMNIPAQUE IOHEXOL 350 MG/ML SOLN  COMPARISON:  None.  FINDINGS: There is mild  atherosclerotic calcification of the aorta. There is extensive coronary arterial calcification. There is filling defect in the upper pole right pulmonary artery which is nearly completely obstructing. There is filling defect in the distal right main pulmonary artery extending into right middle lobe branches, not causing complete obstruction. There is a small volume of thrombus extending into right lower lobe pulmonary arterial system. On the left side, there is filling defect extending into the proximal upper lobe arteries, without complete obstruction. There is also thrombus in more peripheral lingular branches. There is thrombus in proximal left lower lobe branches with contrast opacification of the arterial system distally.  There is a small hiatal hernia.  The lungs are clear. There are no pleural effusions. There is mild atelectasis in the inferior lingula.  Review of the MIP images confirms the above findings.  IMPRESSION: Acute bilateral pulmonary arterial embolism. Critical Value/emergent results were called by telephone at the time of interpretation on 10/01/2013 at 6:30 PM to Dr.ANTHONY ALLEN , who verbally acknowledged these results.   Electronically Signed   By: Esperanza Heir M.D.   On: 10/01/2013 18:30     Assessment/Plan #1 Acute Pulmonary Embolism:  Clinically stable and starting heparin IV followed by coumadin. We discussed the possibility of using Xarelto instead, which he declined due to the potential high cost to him. #2 Thrombocytopenia: mild and will be monitored closely on heparin #3 HTN: stable on current meds.   Laneice Meneely G 10/01/2013, 8:29 PM

## 2013-10-01 NOTE — Progress Notes (Addendum)
ANTICOAGULATION CONSULT NOTE - Initial Consult  Pharmacy Consult for Heparin Indication: pulmonary embolus  No Known Allergies  Patient Measurements: Height: 6' (182.9 cm) Weight: 240 lb (108.863 kg) IBW/kg (Calculated) : 77.6 Heparin Dosing Weight: 90.1kg  Vital Signs: Temp: 97.4 F (36.3 C) (10/24 1652) Temp src: Oral (10/24 1652) BP: 165/57 mmHg (10/24 1652) Pulse Rate: 53 (10/24 1652)  Labs:  Recent Labs  10/01/13 1700  HGB 13.6  HCT 39.8  PLT 127*  APTT 30  LABPROT 12.5  INR 0.95  CREATININE 0.99  TROPONINI <0.30    Estimated Creatinine Clearance: 80.9 ml/min (by C-G formula based on Cr of 0.99).   Medical History: Past Medical History  Diagnosis Date  . Arthritis   . AAA (abdominal aortic aneurysm)   . Hx of hiatal hernia   . GERD (gastroesophageal reflux disease)   . Hypertension   . Hyperlipidemia   . Sigmoid diverticulosis   . Pulmonary embolism 2011    Medications:  Scheduled:   Infusions:  . sodium chloride 20 mL/hr at 10/01/13 1713  . heparin    . heparin     PRN:   Assessment: 78 yo M with possible PE. Patient has had a 2 day history of dyspnea that worsens when walking.  Hx of PE secondary to LLE DVT from 2 years ago Goal of Therapy:  Heparin level goal= 0.3-0.7      Plan:  Heparin Loading dose= 5000 Units IV Heparin maintenance dose= 1450 units/hour Heparin level to be checked 6 hours after starting and then daily thereafter.  Loletta Specter 10/01/2013,8:05 PM

## 2013-10-01 NOTE — ED Notes (Signed)
Pt from home reports SOB and HTN. Pt states that he had PE approx 2 years ago, feels the same way as he sis then. Pt reports dyspnea with walking. Pt denies CP, N/V, fever, diarrhea. Pt is A&O and in NAD.

## 2013-10-02 LAB — CBC
Hemoglobin: 13.1 g/dL (ref 13.0–17.0)
MCH: 31.9 pg (ref 26.0–34.0)
MCHC: 34.5 g/dL (ref 30.0–36.0)
MCV: 92.5 fL (ref 78.0–100.0)
Platelets: 121 10*3/uL — ABNORMAL LOW (ref 150–400)
RDW: 14.3 % (ref 11.5–15.5)

## 2013-10-02 LAB — HEPARIN LEVEL (UNFRACTIONATED)
Heparin Unfractionated: 0.83 IU/mL — ABNORMAL HIGH (ref 0.30–0.70)
Heparin Unfractionated: 0.47 IU/mL (ref 0.30–0.70)

## 2013-10-02 MED ORDER — COUMADIN BOOK
Freq: Once | Status: AC
  Administered 2013-10-02: 15:00:00
  Filled 2013-10-02: qty 1

## 2013-10-02 MED ORDER — WARFARIN SODIUM 7.5 MG PO TABS
7.5000 mg | ORAL_TABLET | Freq: Once | ORAL | Status: AC
  Administered 2013-10-02: 7.5 mg via ORAL
  Filled 2013-10-02: qty 1

## 2013-10-02 MED ORDER — WARFARIN - PHARMACIST DOSING INPATIENT: Freq: Every day | Status: DC

## 2013-10-02 MED ORDER — HEPARIN (PORCINE) IN NACL 100-0.45 UNIT/ML-% IJ SOLN
1250.0000 [IU]/h | INTRAMUSCULAR | Status: AC
  Administered 2013-10-02 – 2013-10-03 (×3): 1250 [IU]/h via INTRAVENOUS
  Filled 2013-10-02 (×6): qty 250

## 2013-10-02 MED ORDER — WARFARIN VIDEO
Freq: Once | Status: AC
  Administered 2013-10-02: 15:00:00

## 2013-10-02 NOTE — Progress Notes (Signed)
Pt upset this morning that he did not get his Melatonin last night for sleep.  Was offered an Ambien, but states that Ambien makes him "wild". Will request Melatonin from MD.

## 2013-10-02 NOTE — Progress Notes (Signed)
Subjective: Slept poorly last night as did not have the melatonin 20 mg qhs that he takes every night, not having dyspnea or unusual bleeding.  Objective: Vital signs in last 24 hours: Temp:  [97.4 F (36.3 C)-98.1 F (36.7 C)] 97.9 F (36.6 C) (10/25 0519) Pulse Rate:  [52-59] 52 (10/25 0519) Resp:  [18-21] 18 (10/25 0519) BP: (145-167)/(52-59) 145/59 mmHg (10/25 0519) SpO2:  [91 %-100 %] 98 % (10/25 0519) Weight:  [108.7 kg (239 lb 10.2 oz)-108.863 kg (240 lb)] 108.7 kg (239 lb 10.2 oz) (10/24 2156) Weight change:    Intake/Output from previous day: 10/24 0701 - 10/25 0700 In: -  Out: 250 [Urine:250]   General appearance: alert, cooperative and no distress Resp: clear to auscultation bilaterally Cardio: regular rate and rhythm GI: soft, non-tender; bowel sounds normal; no masses,  no organomegaly Extremities: bilateral trace leg edema (right more than left)  Lab Results:  Recent Labs  10/01/13 1700 10/02/13 0325  WBC 6.9 8.3  HGB 13.6 13.1  HCT 39.8 38.0*  PLT 127* 121*   BMET  Recent Labs  10/01/13 1700  NA 141  K 3.7  CL 103  CO2 29  GLUCOSE 101*  BUN 13  CREATININE 0.99  CALCIUM 9.8   CMET CMP     Component Value Date/Time   NA 141 10/01/2013 1700   K 3.7 10/01/2013 1700   CL 103 10/01/2013 1700   CO2 29 10/01/2013 1700   GLUCOSE 101* 10/01/2013 1700   BUN 13 10/01/2013 1700   CREATININE 0.99 10/01/2013 1700   CALCIUM 9.8 10/01/2013 1700   PROT 6.8 10/01/2013 1700   ALBUMIN 3.8 10/01/2013 1700   AST 24 10/01/2013 1700   ALT 16 10/01/2013 1700   ALKPHOS 130* 10/01/2013 1700   BILITOT 0.8 10/01/2013 1700   GFRNONAA 78* 10/01/2013 1700   GFRAA 90* 10/01/2013 1700    CBG (last 3)  No results found for this basename: GLUCAP,  in the last 72 hours  INR RESULTS:   Lab Results  Component Value Date   INR 0.95 10/01/2013   INR 0.88 11/12/2010   INR 2.70* 03/14/2010     Studies/Results: Ct Angio Chest Pe W/cm &/or Wo Cm  10/01/2013    CLINICAL DATA:  Shortness of breath on exertion  EXAM: CT ANGIOGRAPHY CHEST WITH CONTRAST  TECHNIQUE: Multidetector CT imaging of the chest was performed using the standard protocol during bolus administration of intravenous contrast. Multiplanar CT image reconstructions including MIPs were obtained to evaluate the vascular anatomy.  CONTRAST:  OMNIPAQUE IOHEXOL 350 MG/ML SOLN  COMPARISON:  None.  FINDINGS: There is mild atherosclerotic calcification of the aorta. There is extensive coronary arterial calcification. There is filling defect in the upper pole right pulmonary artery which is nearly completely obstructing. There is filling defect in the distal right main pulmonary artery extending into right middle lobe branches, not causing complete obstruction. There is a small volume of thrombus extending into right lower lobe pulmonary arterial system. On the left side, there is filling defect extending into the proximal upper lobe arteries, without complete obstruction. There is also thrombus in more peripheral lingular branches. There is thrombus in proximal left lower lobe branches with contrast opacification of the arterial system distally.  There is a small hiatal hernia.  The lungs are clear. There are no pleural effusions. There is mild atelectasis in the inferior lingula.  Review of the MIP images confirms the above findings.  IMPRESSION: Acute bilateral pulmonary arterial embolism.  Critical Value/emergent results were called by telephone at the time of interpretation on 10/01/2013 at 6:30 PM to Dr.ANTHONY ALLEN , who verbally acknowledged these results.   Electronically Signed   By: Esperanza Heir M.D.   On: 10/01/2013 18:30    Medications: I have reviewed the patient's current medications.  Assessment/Plan: #1 Pulmonary Embolism: stable and will start coumadin today #2 HTN: stable on current meds #3 Insomnia: will restart melatonin and note that he has an adverse reaction to ambien   LOS: 1  day   Christian Wiley 10/02/2013, 9:50 AM

## 2013-10-02 NOTE — Progress Notes (Signed)
ANTICOAGULATION CONSULT NOTE - Follow Up Consult  Pharmacy Consult for Heparin/Coumadin Indication: pulmonary embolus  Allergies  Allergen Reactions  . Ambien [Zolpidem Tartrate]     Goes wild, gets anxious and confused with ambien    Patient Measurements: Height: 6' (182.9 cm) Weight: 239 lb 10.2 oz (108.7 kg) IBW/kg (Calculated) : 77.6 Heparin Dosing Weight: 100 kg  Vital Signs: Temp: 97.9 F (36.6 C) (10/25 0519) Temp src: Oral (10/25 0519) BP: 145/59 mmHg (10/25 0519) Pulse Rate: 52 (10/25 0519)  Labs:  Recent Labs  10/01/13 1700 10/02/13 0325 10/02/13 1141  HGB 13.6 13.1  --   HCT 39.8 38.0*  --   PLT 127* 121*  --   APTT 30  --   --   LABPROT 12.5  --   --   INR 0.95  --   --   HEPARINUNFRC  --  0.83* 0.65  CREATININE 0.99  --   --   TROPONINI <0.30  --   --     Estimated Creatinine Clearance: 80.8 ml/min (by C-G formula based on Cr of 0.99).   Medical History: Past Medical History  Diagnosis Date  . Arthritis   . AAA (abdominal aortic aneurysm)   . Hx of hiatal hernia   . GERD (gastroesophageal reflux disease)   . Hypertension   . Hyperlipidemia   . Sigmoid diverticulosis   . Pulmonary embolism 2011    Medications:  Scheduled:  . allopurinol  300 mg Oral Daily  . aspirin  81 mg Oral Daily  . atorvastatin  40 mg Oral Daily  . citalopram  20 mg Oral Daily  . multivitamin with minerals  1 tablet Oral Daily  . nebivolol  5 mg Oral Daily  . sodium chloride  3 mL Intravenous Q12H   Infusions:  . sodium chloride 20 mL/hr at 10/01/13 1713  . heparin 1,250 Units/hr (10/02/13 1025)   PRN:   Assessment: 78 yo M with possible PE. Patient has had a 2 day history of dyspnea that worsens when walking. Hx of PE secondary to LLE DVT from 2 years ago. IV heparin started 10/24 pm, starting warfarin 10/25.  Heparin level therapeutic at 0.65 on 1250 units/hr  H/H stable, Pltc low prior to starting heparin, no bleeding reported  Today is day #1/5  required overlap of heparin/warfarin for new VTE  Goal of Therapy:  Heparin level goal= 0.3-0.7 INR = 2-3   Plan:   Continue heparin 1250 units/hr - transition to lovenox for outpatient therapy?  Recheck heparin level in 6 hours to confirm therapeutic  Begin warfarin 7.5mg  today  Daily heparin level, CBC, PT/INR  Provide warfarin education prior to discharge   Loralee Pacas, PharmD, BCPS Pager: [phone] 10/02/2013 12:48 PM

## 2013-10-02 NOTE — Progress Notes (Signed)
PHARMACIST - PHYSICIAN ORDER COMMUNICATION  CONCERNING: P&T Medication Policy on Herbal Medications  DESCRIPTION:  This patient's nursing care order for:  Melatonin  has been noted. RN called to ask how to enter order for medication  This product(s) is classified as an "herbal" or natural product. Due to a lack of definitive safety studies or FDA approval, nonstandard manufacturing practices, plus the potential risk of unknown drug-drug interactions while on inpatient medications, the Pharmacy and Therapeutics Committee does not permit the use of "herbal" or natural products of this type within [facility].   ACTION TAKEN: The pharmacy department is unable to verify this order at this time and your patient has been informed of this safety policy. Please reevaluate patient's clinical condition at discharge and address if the herbal or natural product(s) should be resumed at that time.  Thank you,   Otho Bellows PharmD Pager [phone] 10/02/2013, 1:53 PM

## 2013-10-02 NOTE — Progress Notes (Signed)
ANTICOAGULATION CONSULT NOTE - Follow Up Consult  Pharmacy Consult for Heparin Indication: pulmonary embolus  No Known Allergies  Patient Measurements: Height: 6' (182.9 cm) Weight: 239 lb 10.2 oz (108.7 kg) IBW/kg (Calculated) : 77.6 Heparin Dosing Weight: 100 kg  Vital Signs: Temp: 98.1 F (36.7 C) (10/24 2156) Temp src: Oral (10/24 2156) BP: 154/52 mmHg (10/24 2156) Pulse Rate: 58 (10/24 2156)  Labs:  Recent Labs  10/01/13 1700 10/02/13 0325  HGB 13.6 13.1  HCT 39.8 38.0*  PLT 127* 121*  APTT 30  --   LABPROT 12.5  --   INR 0.95  --   HEPARINUNFRC  --  0.83*  CREATININE 0.99  --   TROPONINI <0.30  --     Estimated Creatinine Clearance: 80.8 ml/min (by C-G formula based on Cr of 0.99).   Medical History: Past Medical History  Diagnosis Date  . Arthritis   . AAA (abdominal aortic aneurysm)   . Hx of hiatal hernia   . GERD (gastroesophageal reflux disease)   . Hypertension   . Hyperlipidemia   . Sigmoid diverticulosis   . Pulmonary embolism 2011    Medications:  Scheduled:  . allopurinol  300 mg Oral Daily  . aspirin  81 mg Oral Daily  . atorvastatin  40 mg Oral Daily  . citalopram  20 mg Oral Daily  . multivitamin with minerals  1 tablet Oral Daily  . nebivolol  5 mg Oral Daily  . sodium chloride  3 mL Intravenous Q12H   Infusions:  . sodium chloride 20 mL/hr at 10/01/13 1713  . heparin 1,450 Units/hr (10/01/13 2044)   PRN:   Assessment: 78 yo M with possible PE. Patient has had a 2 day history of dyspnea that worsens when walking.  Hx of PE secondary to LLE DVT from 78 years ago  First heparin level = 0.83 with heparin infusing @ 1450 units/hr  No complications of therapy noted  Goal of Therapy:  Heparin level goal= 0.3-0.7   Plan:  Decrease heparin rate to 1250 units/hr Heparin level to be checked 6 hours after rate decrease   Valorie Mcgrory, Joselyn Glassman, PharmD 10/02/2013,4:20 AM

## 2013-10-02 NOTE — Progress Notes (Signed)
Pharmacy Brief Note - Heparin Consult Indication: acute PE  78 yoM on IV heparin at 1250 units/hr. First heparin was therapeutic this morning, confirmatory level thi afternoon also therapeutic at 0.47  Hgb 13.1 Hct: 38 Plts: 121K (monitor)  No issues noted  Plan: Continue IV heparin at 1250 units/hr. Daily heparin level and CBC.  Geoffry Paradise, PharmD, BCPS Pager: [phone] 6:23 PM Pharmacy #: [phone]

## 2013-10-03 LAB — CBC
HCT: 37.4 % — ABNORMAL LOW (ref 39.0–52.0)
Hemoglobin: 12.6 g/dL — ABNORMAL LOW (ref 13.0–17.0)
MCHC: 33.7 g/dL (ref 30.0–36.0)
MCV: 92.8 fL (ref 78.0–100.0)
RBC: 4.03 MIL/uL — ABNORMAL LOW (ref 4.22–5.81)

## 2013-10-03 LAB — PROTIME-INR
INR: 1 (ref 0.00–1.49)
Prothrombin Time: 13 seconds (ref 11.6–15.2)

## 2013-10-03 LAB — HEPARIN LEVEL (UNFRACTIONATED): Heparin Unfractionated: 0.44 IU/mL (ref 0.30–0.70)

## 2013-10-03 MED ORDER — WARFARIN SODIUM 7.5 MG PO TABS
7.5000 mg | ORAL_TABLET | Freq: Once | ORAL | Status: AC
  Administered 2013-10-03: 7.5 mg via ORAL
  Filled 2013-10-03: qty 1

## 2013-10-03 NOTE — Progress Notes (Signed)
ANTICOAGULATION CONSULT NOTE - Follow Up Consult  Pharmacy Consult for Heparin/Warfarin Indication: pulmonary embolus  Allergies  Allergen Reactions  . Ambien [Zolpidem Tartrate]     Goes wild, gets anxious and confused with ambien    Patient Measurements: Height: 6' (182.9 cm) Weight: 239 lb 10.2 oz (108.7 kg) IBW/kg (Calculated) : 77.6 Heparin Dosing Weight: 100 kg  Vital Signs: Temp: 97.6 F (36.4 C) (10/26 0451) Temp src: Oral (10/26 0451) BP: 149/80 mmHg (10/26 0646) Pulse Rate: 55 (10/26 0451)  Labs:  Recent Labs  10/01/13 1700  10/02/13 0325 10/02/13 1141 10/02/13 1708 10/03/13 0400  HGB 13.6  --  13.1  --   --  12.6*  HCT 39.8  --  38.0*  --   --  37.4*  PLT 127*  --  121*  --   --  126*  APTT 30  --   --   --   --   --   LABPROT 12.5  --   --   --   --  13.0  INR 0.95  --   --   --   --  1.00  HEPARINUNFRC  --   < > 0.83* 0.65 0.47 0.44  CREATININE 0.99  --   --   --   --   --   TROPONINI <0.30  --   --   --   --   --   < > = values in this interval not displayed.  Estimated Creatinine Clearance: 80.8 ml/min (by C-G formula based on Cr of 0.99).   Medical History: Past Medical History  Diagnosis Date  . Arthritis   . AAA (abdominal aortic aneurysm)   . Hx of hiatal hernia   . GERD (gastroesophageal reflux disease)   . Hypertension   . Hyperlipidemia   . Sigmoid diverticulosis   . Pulmonary embolism 2011    Medications:  Scheduled:  . allopurinol  300 mg Oral Daily  . aspirin  81 mg Oral Daily  . atorvastatin  40 mg Oral Daily  . citalopram  20 mg Oral Daily  . multivitamin with minerals  1 tablet Oral Daily  . nebivolol  5 mg Oral Daily  . sodium chloride  3 mL Intravenous Q12H  . Warfarin - Pharmacist Dosing Inpatient   Does not apply q1800   Infusions:  . sodium chloride 20 mL/hr at 10/01/13 1713  . heparin 1,250 Units/hr (10/03/13 1610)   Inpatient warfarin doses: 7.5mg  (10/25)  Assessment: 78 yo M with possible PE. Patient has  had a 2 day history of dyspnea that worsens when walking. Hx of PE secondary to LLE DVT from 2 years ago. IV heparin started 10/24 pm, starting warfarin 10/25.  Heparin level therapeutic at 0.44 on 1250 units/hr  H/H slightly decreased, Pltc low prior to starting heparin, no bleeding reported  INR subtherapeutic as expected after first dose of warfarin 7.5mg  last night  Today is day #2/5 required overlap of heparin/warfarin for new VTE  Goal of Therapy:  Heparin level goal= 0.3-0.7 INR = 2-3   Plan:   Continue heparin 1250 units/hr - transition to lovenox for outpatient therapy?  Recheck heparin level in 6 hours to confirm therapeutic  Repeat warfarin 7.5mg  today  Daily heparin level, CBC, PT/INR  Provide warfarin education prior to discharge   Loralee Pacas, PharmD, BCPS Pager: [phone] 10/03/2013 9:07 AM

## 2013-10-03 NOTE — Progress Notes (Addendum)
Subjective: Feels fine although not sleeping well, no dyspnea, appetite is good  Objective: Vital signs in last 24 hours: Temp:  [97.3 F (36.3 C)-98.4 F (36.9 C)] 97.6 F (36.4 C) (10/26 0451) Pulse Rate:  [54-59] 55 (10/26 0451) Resp:  [18-20] 18 (10/26 0451) BP: (142-174)/(60-80) 149/80 mmHg (10/26 0646) SpO2:  [94 %-98 %] 94 % (10/26 0451) Weight change:    Intake/Output from previous day: 10/25 0701 - 10/26 0700 In: 1247.5 [P.O.:960; I.V.:287.5] Out: 900 [Urine:900]   General appearance: alert, cooperative and no distress Resp: clear to auscultation bilaterally Cardio: regular rate and rhythm GI: soft, non-tender; bowel sounds normal; no masses,  no organomegaly Extremities: bilateral trace leg edema  Lab Results:  Recent Labs  10/02/13 0325 10/03/13 0400  WBC 8.3 8.3  HGB 13.1 12.6*  HCT 38.0* 37.4*  PLT 121* 126*   BMET  Recent Labs  10/01/13 1700  NA 141  K 3.7  CL 103  CO2 29  GLUCOSE 101*  BUN 13  CREATININE 0.99  CALCIUM 9.8   CMET CMP     Component Value Date/Time   NA 141 10/01/2013 1700   K 3.7 10/01/2013 1700   CL 103 10/01/2013 1700   CO2 29 10/01/2013 1700   GLUCOSE 101* 10/01/2013 1700   BUN 13 10/01/2013 1700   CREATININE 0.99 10/01/2013 1700   CALCIUM 9.8 10/01/2013 1700   PROT 6.8 10/01/2013 1700   ALBUMIN 3.8 10/01/2013 1700   AST 24 10/01/2013 1700   ALT 16 10/01/2013 1700   ALKPHOS 130* 10/01/2013 1700   BILITOT 0.8 10/01/2013 1700   GFRNONAA 78* 10/01/2013 1700   GFRAA 90* 10/01/2013 1700    CBG (last 3)  No results found for this basename: GLUCAP,  in the last 72 hours  INR RESULTS:   Lab Results  Component Value Date   INR 1.00 10/03/2013   INR 0.95 10/01/2013   INR 0.88 11/12/2010     Studies/Results: Ct Angio Chest Pe W/cm &/or Wo Cm  10/01/2013   CLINICAL DATA:  Shortness of breath on exertion  EXAM: CT ANGIOGRAPHY CHEST WITH CONTRAST  TECHNIQUE: Multidetector CT imaging of the chest was performed  using the standard protocol during bolus administration of intravenous contrast. Multiplanar CT image reconstructions including MIPs were obtained to evaluate the vascular anatomy.  CONTRAST:  OMNIPAQUE IOHEXOL 350 MG/ML SOLN  COMPARISON:  None.  FINDINGS: There is mild atherosclerotic calcification of the aorta. There is extensive coronary arterial calcification. There is filling defect in the upper pole right pulmonary artery which is nearly completely obstructing. There is filling defect in the distal right main pulmonary artery extending into right middle lobe branches, not causing complete obstruction. There is a small volume of thrombus extending into right lower lobe pulmonary arterial system. On the left side, there is filling defect extending into the proximal upper lobe arteries, without complete obstruction. There is also thrombus in more peripheral lingular branches. There is thrombus in proximal left lower lobe branches with contrast opacification of the arterial system distally.  There is a small hiatal hernia.  The lungs are clear. There are no pleural effusions. There is mild atelectasis in the inferior lingula.  Review of the MIP images confirms the above findings.  IMPRESSION: Acute bilateral pulmonary arterial embolism. Critical Value/emergent results were called by telephone at the time of interpretation on 10/01/2013 at 6:30 PM to Dr.ANTHONY ALLEN , who verbally acknowledged these results.   Electronically Signed   By: Marcy Salvo  Rubner M.D.   On: 10/01/2013 18:30    Medications: I have reviewed the patient's current medications.  Assessment/Plan: #1 PE: stable on heparin and got first dose of coumadin yesterday #2 Insomnia: chronic issue, may consider trial of trazodone #3 Thrombocytopenia: stable   LOS: 2 days   Eutha Cude G 10/03/2013, 8:02 AM

## 2013-10-04 LAB — CBC
Hemoglobin: 12.9 g/dL — ABNORMAL LOW (ref 13.0–17.0)
Platelets: 125 10*3/uL — ABNORMAL LOW (ref 150–400)
RBC: 4.11 MIL/uL — ABNORMAL LOW (ref 4.22–5.81)
WBC: 8.1 10*3/uL (ref 4.0–10.5)

## 2013-10-04 LAB — PROTIME-INR: Prothrombin Time: 14.2 seconds (ref 11.6–15.2)

## 2013-10-04 LAB — HEPARIN LEVEL (UNFRACTIONATED): Heparin Unfractionated: 0.5 IU/mL (ref 0.30–0.70)

## 2013-10-04 MED ORDER — WARFARIN SODIUM 10 MG PO TABS
10.0000 mg | ORAL_TABLET | Freq: Once | ORAL | Status: DC
  Filled 2013-10-04: qty 1

## 2013-10-04 MED ORDER — RIVAROXABAN 20 MG PO TABS: 20.0000 mg | ORAL_TABLET | Freq: Every day | ORAL | Status: DC

## 2013-10-04 MED ORDER — RIVAROXABAN 15 MG PO TABS
15.0000 mg | ORAL_TABLET | Freq: Two times a day (BID) | ORAL | Status: DC
  Administered 2013-10-04: 15 mg via ORAL
  Filled 2013-10-04 (×2): qty 1

## 2013-10-04 MED ORDER — RIVAROXABAN 15 MG PO TABS: 15.0000 mg | ORAL_TABLET | Freq: Two times a day (BID) | ORAL | Status: DC

## 2013-10-04 NOTE — Progress Notes (Signed)
Subjective: Christian Wiley is doing well just awaiting Coumadin heparin overlap. Respiratory symptoms have improved.  Objective: Vital signs in last 24 hours: Temp:  [97.7 F (36.5 C)-98.6 F (37 C)] 98.5 F (36.9 C) (10/27 0525) Pulse Rate:  [51-60] 60 (10/27 0525) Resp:  [16-20] 16 (10/27 0525) BP: (121-167)/(75-99) 167/76 mmHg (10/27 0525) SpO2:  [96 %-97 %] 96 % (10/27 0525) Weight change:   CBG (last 3)  No results found for this basename: GLUCAP,  in the last 72 hours  Intake/Output from previous day: 10/26 0701 - 10/27 0700 In: 1710 [P.O.:1560; I.V.:150] Out: 1800 [Urine:1800]  Physical Exam: General appearance: alert, cooperative and no distress  Resp: clear to auscultation bilaterally  Cardio: regular rate and rhythm  GI: soft, non-tender; bowel sounds normal; no masses, no organomegaly  Extremities: bilateral trace edema    Lab Results:  Recent Labs  10/01/13 1700  NA 141  K 3.7  CL 103  CO2 29  GLUCOSE 101*  BUN 13  CREATININE 0.99  CALCIUM 9.8    Recent Labs  10/01/13 1700  AST 24  ALT 16  ALKPHOS 130*  BILITOT 0.8  PROT 6.8  ALBUMIN 3.8    Recent Labs  10/01/13 1700  10/03/13 0400 10/04/13 0520  WBC 6.9  < > 8.3 8.1  NEUTROABS 3.5  --   --   --   HGB 13.6  < > 12.6* 12.9*  HCT 39.8  < > 37.4* 38.3*  MCV 93.0  < > 92.8 93.2  PLT 127*  < > 126* 125*  < > = values in this interval not displayed. Lab Results  Component Value Date   INR 1.12 10/04/2013   INR 1.00 10/03/2013   INR 0.95 10/01/2013    Recent Labs  10/01/13 1700  TROPONINI <0.30   No results found for this basename: TSH, T4TOTAL, FREET3, T3FREE, THYROIDAB,  in the last 72 hours No results found for this basename: VITAMINB12, FOLATE, FERRITIN, TIBC, IRON, RETICCTPCT,  in the last 72 hours  Studies/Results: No results found.   Assessment/Plan: #1 recurrent pulmonary embolus stable on heparin transitioning to Coumadin #2 essential hypertension stable  #3  hyperlipidemia #4 gastroesophageal reflux disease  Plan continue anticoagulation consult care management about availability of Lovenox for home dosing   LOS: 3 days   Asees Manfredi A 10/04/2013, 7:51 AM

## 2013-10-04 NOTE — Progress Notes (Signed)
PER [employer]UE MEDICARE LOVENOX COPAY IS $70.00 AND XARELTO COPAY IS $30.00.

## 2013-10-04 NOTE — Progress Notes (Addendum)
ANTICOAGULATION CONSULT NOTE - Follow Up Consult  Pharmacy Consult for Heparin/Warfarin --> xarelto Indication: pulmonary embolus  Allergies  Allergen Reactions  . Ambien [Zolpidem Tartrate]     Goes wild, gets anxious and confused with ambien    Patient Measurements: Height: 6' (182.9 cm) Weight: 239 lb 10.2 oz (108.7 kg) IBW/kg (Calculated) : 77.6 Heparin Dosing Weight: 100 kg  Vital Signs: Temp: 98.5 F (36.9 C) (10/27 0525) Temp src: Oral (10/27 0525) BP: 167/76 mmHg (10/27 0525) Pulse Rate: 60 (10/27 0525)  Labs:  Recent Labs  10/01/13 1700 10/02/13 0325  10/02/13 1708 10/03/13 0400 10/04/13 0520  HGB 13.6 13.1  --   --  12.6* 12.9*  HCT 39.8 38.0*  --   --  37.4* 38.3*  PLT 127* 121*  --   --  126* 125*  APTT 30  --   --   --   --   --   LABPROT 12.5  --   --   --  13.0 14.2  INR 0.95  --   --   --  1.00 1.12  HEPARINUNFRC  --  0.83*  < > 0.47 0.44 0.50  CREATININE 0.99  --   --   --   --   --   TROPONINI <0.30  --   --   --   --   --   < > = values in this interval not displayed.  Estimated Creatinine Clearance: 80.8 ml/min (by C-G formula based on Cr of 0.99).   Medical History: Past Medical History  Diagnosis Date  . Arthritis   . AAA (abdominal aortic aneurysm)   . Hx of hiatal hernia   . GERD (gastroesophageal reflux disease)   . Hypertension   . Hyperlipidemia   . Sigmoid diverticulosis   . Pulmonary embolism 2011    Medications:  Scheduled:  . allopurinol  300 mg Oral Daily  . aspirin  81 mg Oral Daily  . atorvastatin  40 mg Oral Daily  . citalopram  20 mg Oral Daily  . multivitamin with minerals  1 tablet Oral Daily  . nebivolol  5 mg Oral Daily  . sodium chloride  3 mL Intravenous Q12H  . Warfarin - Pharmacist Dosing Inpatient   Does not apply q1800   Infusions:  . sodium chloride 20 mL/hr at 10/01/13 1713  . heparin 1,250 Units/hr (10/03/13 1815)   Inpatient warfarin doses: 7.5mg  (10/25)  Assessment: 78 yo M with possible  PE. Patient has had a 2 day history of dyspnea that worsens when walking. Hx of PE secondary to LLE DVT from 2 years ago. IV heparin started 10/24 pm, starting warfarin 10/25.  Heparin level therapeutic at 0.50 on 1250 units/hr  Today's INR = 1.12 (slow to move upward s/p warfarin 7.5mg  x 2)  H/H slightly decreased, Pltc low prior to starting heparin, currently stable with no bleeding reported  Today is day #3/5 required overlap of heparin/warfarin for new VTE  Goal of Therapy:  Heparin level goal= 0.3-0.7 INR = 2-3   Plan:   Continue heparin 1250 units/hr - transition to lovenox for outpatient therapy?  Increase warfarin 10mg  today d/t INR slow rate of rise  Daily heparin level, CBC, PT/INR  Provide warfarin education prior to discharge   Juliette Alcide, PharmD, BCPS.   Pager: [phone] 10/04/2013 8:54 AM   ADDENDUM  Heparin/warfarin changed to Xarelto  1. D/C warfarin orders 2. At time of stopping heparin gtt, give 1st dose of xarelto and  give xarelto 15mg  PO BIDWC fro 21 days then 20mg  daily  3. D/C INR, heparin levels 4. Education completed  Juliette Alcide, PharmD, BCPS.   Pager: [phone]. 10/04/2013 12:09 PM ]

## 2013-10-04 NOTE — Discharge Summary (Signed)
DISCHARGE SUMMARY  Christian Wiley  MR#: [id number]  DOB:1935-06-25  Date of Admission: 10/01/2013 Date of Discharge: 10/04/2013  Attending Physician:Desyre Calma A  Patient's WUJ:WJXBJYN,WGNFAOZ A, MD  Consults:  none  Discharge Diagnoses: Principal Problem:   Pulmonary embolism Active Problems:   Thrombocytopenia   Hypertension   Discharge Medications:   Medication List         alendronate 70 MG tablet  Commonly known as:  FOSAMAX  Take 70 mg by mouth every 7 (seven) days. Take with a full glass of water on an empty stomach.     allopurinol 300 MG tablet  Commonly known as:  ZYLOPRIM  Take 300 mg by mouth daily.     ALPRAZolam 0.5 MG tablet  Commonly known as:  XANAX  Take 0.5 mg by mouth at bedtime as needed for sleep.     aspirin 81 MG tablet  Take 81 mg by mouth daily.     atorvastatin 40 MG tablet  Commonly known as:  LIPITOR  Take 1 tablet by mouth daily.     citalopram 20 MG tablet  Commonly known as:  CELEXA  Take 20 mg by mouth daily.     HYDROcodone-acetaminophen 5-325 MG per tablet  Commonly known as:  NORCO/VICODIN  Take 1 tablet by mouth as needed.     meclizine 25 MG tablet  Commonly known as:  ANTIVERT  Take 25 mg by mouth as needed for dizziness.     Melatonin 10 MG Caps  Take 20 mg by mouth at bedtime.     multivitamin-iron-minerals-folic acid chewable tablet  Chew 1 tablet by mouth daily.     nebivolol 5 MG tablet  Commonly known as:  BYSTOLIC  Take 5 mg by mouth daily.     Rivaroxaban 15 MG Tabs tablet  Commonly known as:  XARELTO  Take 1 tablet (15 mg total) by mouth 2 (two) times daily with a meal.     Rivaroxaban 20 MG Tabs tablet  Commonly known as:  XARELTO  Take 1 tablet (20 mg total) by mouth daily with supper.  Start taking on:  10/25/2013        Hospital Procedures: Ct Angio Chest Pe W/cm &/or Wo Cm  10/01/2013   CLINICAL DATA:  Shortness of breath on exertion  EXAM: CT ANGIOGRAPHY CHEST WITH CONTRAST   TECHNIQUE: Multidetector CT imaging of the chest was performed using the standard protocol during bolus administration of intravenous contrast. Multiplanar CT image reconstructions including MIPs were obtained to evaluate the vascular anatomy.  CONTRAST:  OMNIPAQUE IOHEXOL 350 MG/ML SOLN  COMPARISON:  None.  FINDINGS: There is mild atherosclerotic calcification of the aorta. There is extensive coronary arterial calcification. There is filling defect in the upper pole right pulmonary artery which is nearly completely obstructing. There is filling defect in the distal right main pulmonary artery extending into right middle lobe branches, not causing complete obstruction. There is a small volume of thrombus extending into right lower lobe pulmonary arterial system. On the left side, there is filling defect extending into the proximal upper lobe arteries, without complete obstruction. There is also thrombus in more peripheral lingular branches. There is thrombus in proximal left lower lobe branches with contrast opacification of the arterial system distally.  There is a small hiatal hernia.  The lungs are clear. There are no pleural effusions. There is mild atelectasis in the inferior lingula.  Review of the MIP images confirms the above findings.  IMPRESSION: Acute bilateral pulmonary arterial  embolism. Critical Value/emergent results were called by telephone at the time of interpretation on 10/01/2013 at 6:30 PM to Dr.ANTHONY ALLEN , who verbally acknowledged these results.   Electronically Signed   By: Esperanza Heir M.D.   On: 10/01/2013 18:30    History of Present Illness:  The patient is a 78 year old Caucasian man with several medical problems, most notably a pulmonary embolism about 2 years ago associated with DVT. He was in his usual state of good health until the past week when he had gradually increasing dyspnea on exertion. Today he had to stop and rest because of fatigue and dyspnea with minimal  activity, so he presented to the emergency room for evaluation. He had not had recent fever, chills, cough, chest pain, or palpitations. His previous pulmonary embolism was treated with heparin followed by Coumadin for over 6 months time. He has not had any recent prolonged car ride or airplane rides, and has not had any recent trauma to his lower extremities.   Hospital Course: Christian Wiley was admitted to the medical service with an acute pulmonary embolus treated with IV heparin and started on Coumadin while awaiting further disposition and decisions.  This is a recurrent process for him so as discussed this obligate and anticoagulation for life.  This certainly may be contributed to by his chronic back problems and difficult mobility.  He tolerated heparin well and on the morning of this discharge we discussed the fact that we would try to convert him to xarelto if pharmacy benefits were favorable.  It did turn out that he only has a 30 L co-pay so with pharmacy guidance we started xarelto 15 mg twice a day and we'll follows up as an outpatient.  He has been stable from a cardiopulmonary standpoint.  He's been having no respiratory distress lung fields are clear.  Blood pressure and O2 sat has been normal.  Day of Discharge Exam BP 148/59  Pulse 60  Temp(Src) 97.8 F (36.6 C) (Oral)  Resp 16  Ht 6' (1.829 m)  Wt 108.7 kg (239 lb 10.2 oz)  BMI 32.49 kg/m2  SpO2 95%  Physical Exam: General appearance: alert, cooperative and no distress Eyes: no scleral icterus Throat: oropharynx moist without erythema Resp: clear to auscultation bilaterally Cardio: regular rate and rhythm Extremities: no clubbing, cyanosis or edema  Discharge Labs:  Recent Labs  10/01/13 1700  NA 141  K 3.7  CL 103  CO2 29  GLUCOSE 101*  BUN 13  CREATININE 0.99  CALCIUM 9.8    Recent Labs  10/01/13 1700  AST 24  ALT 16  ALKPHOS 130*  BILITOT 0.8  PROT 6.8  ALBUMIN 3.8    Recent Labs  10/01/13 1700   10/03/13 0400 10/04/13 0520  WBC 6.9  < > 8.3 8.1  NEUTROABS 3.5  --   --   --   HGB 13.6  < > 12.6* 12.9*  HCT 39.8  < > 37.4* 38.3*  MCV 93.0  < > 92.8 93.2  PLT 127*  < > 126* 125*  < > = values in this interval not displayed.  Recent Labs  10/01/13 1700  TROPONINI <0.30   No results found for this basename: TSH, T4TOTAL, FREET3, T3FREE, THYROIDAB,  in the last 72 hours No results found for this basename: VITAMINB12, FOLATE, FERRITIN, TIBC, IRON, RETICCTPCT,  in the last 72 hours  Discharge instructions:     Discharge Orders   Future Appointments Provider Department Dept Phone   01/18/2015  8:30 AM [facility] [place] [facility] [address] [phone]   Eat a light meal the night before the exam but please avoid gaseous foods.   Nothing to eat or drink for at least 8 hours prior to the exam. No gum chewing or smoking the morning of the exam. Please take your morning medications with small sips of water, especially blood pressure medication. If you have several vascular lab exams and will see physician, please bring a snack with you.   01/18/2015 9:00 AM Annye Rusk, NP [facility] [phone]   Future Orders Complete By Expires   Diet - low sodium heart healthy  As directed    Increase activity slowly  As directed       Disposition: home with wife  Follow-up Appts: Follow-up with Dr. Jacky Kindle at [facility] in this week.  Call for appointment.  Condition on Discharge: stable and improved  Tests Needing Follow-up: none  Signed: Bonita Brindisi A 10/04/2013, 3:41 PM

## 2013-10-14 ENCOUNTER — Other Ambulatory Visit: Payer: Self-pay

## 2013-12-22 ENCOUNTER — Ambulatory Visit
Admission: RE | Admit: 2013-12-22 | Discharge: 2013-12-22 | Disposition: A | Discharge status: Home / Self Care | Payer: Medicare Other | Source: Ambulatory Visit | Attending: Neurosurgery | Admitting: Neurosurgery

## 2013-12-22 ENCOUNTER — Other Ambulatory Visit: Payer: Self-pay | Admitting: Neurosurgery

## 2013-12-22 DIAGNOSIS — M4716 Other spondylosis with myelopathy, lumbar region: Secondary | ICD-10-CM

## 2014-06-29 NOTE — Progress Notes (Signed)
Pleas put orders in EPIC as patient has pre-op appointment on 07/08/2014 at 11 am ! Thank you!

## 2014-06-30 ENCOUNTER — Other Ambulatory Visit: Payer: Self-pay | Admitting: Orthopedic Surgery

## 2014-07-01 ENCOUNTER — Encounter (HOSPITAL_COMMUNITY): Payer: Self-pay | Admitting: Pharmacy Technician

## 2014-07-07 NOTE — Patient Instructions (Signed)
Christian Wiley  07/07/2014   Your procedure is scheduled on:  07/15/2014  1145am-1245pm  Report to [facility].  Follow the Signs to [facility] at        [phone]  Call this number if you have problems the morning of surgery: [phone]   Remember:   Do not eat food or drink liquids after midnight.   Take these medicines the morning of surgery with A SIP OF WATER:    Do not wear jewelry,   Do not wear lotions, powders, or perfumes. , deodorant    Men may shave face and neck.  Do not bring valuables to the hospital.  Contacts, dentures or bridgework may not be worn into surgery.  Leave suitcase in the car. After surgery it may be brought to your room.  For patients admitted to the hospital, checkout time is 11:00 AM the day of  discharge.         Please read over the following fact sheets that you were given: [facility] - Preparing for Surgery Before surgery, you can play an important role.  Because skin is not sterile, your skin needs to be as free of germs as possible.  You can reduce the number of germs on your skin by washing with CHG (chlorahexidine gluconate) soap before surgery.  CHG is an antiseptic cleaner which kills germs and bonds with the skin to continue killing germs even after washing. Please DO NOT use if you have an allergy to CHG or antibacterial soaps.  If your skin becomes reddened/irritated stop using the CHG and inform your nurse when you arrive at Short Stay. Do not shave (including legs and underarms) for at least 48 hours prior to the first CHG shower.  You may shave your face/neck. Please follow these instructions carefully:  1.  Shower with CHG Soap the night before surgery and the  morning of Surgery.  2.  If you choose to wash your hair, wash your hair first as usual with your  normal  shampoo.  3.  After you shampoo, rinse your hair and body thoroughly to remove the  shampoo.                           4.  Use CHG as you would any other  liquid soap.  You can apply chg directly  to the skin and wash                       Gently with a scrungie or clean washcloth.  5.  Apply the CHG Soap to your body ONLY FROM THE NECK DOWN.   Do not use on face/ open                           Wound or open sores. Avoid contact with eyes, ears mouth and genitals (private parts).                       Wash face,  Genitals (private parts) with your normal soap.             6.  Wash thoroughly, paying special attention to the area where your surgery  will be performed.  7.  Thoroughly rinse your body with warm water from the neck down.  8.  DO NOT shower/wash with your normal soap after using and rinsing  off  the CHG Soap.                9.  Pat yourself dry with a clean towel.            10.  Wear clean pajamas.            11.  Place clean sheets on your bed the night of your first shower and do not  sleep with pets. Day of Surgery : Do not apply any lotions/deodorants the morning of surgery.  Please wear clean clothes to the hospital/surgery center.  FAILURE TO FOLLOW THESE INSTRUCTIONS MAY RESULT IN THE CANCELLATION OF YOUR SURGERY PATIENT SIGNATURE_________________________________  NURSE SIGNATURE__________________________________  ________________________________________________________________________  coughing and deep breathing exercises, leg exercises

## 2014-07-08 ENCOUNTER — Encounter (HOSPITAL_COMMUNITY): Payer: Self-pay

## 2014-07-08 ENCOUNTER — Encounter (HOSPITAL_COMMUNITY)
Admission: RE | Admit: 2014-07-08 | Discharge: 2014-07-08 | Disposition: A | Discharge status: Home / Self Care | Payer: Medicare Other | Source: Ambulatory Visit | Attending: Orthopedic Surgery | Admitting: Orthopedic Surgery

## 2014-07-08 ENCOUNTER — Ambulatory Visit (HOSPITAL_COMMUNITY)
Admission: RE | Admit: 2014-07-08 | Discharge: 2014-07-08 | Disposition: A | Discharge status: Home / Self Care | Payer: Medicare Other | Source: Ambulatory Visit | Attending: Orthopedic Surgery | Admitting: Orthopedic Surgery

## 2014-07-08 DIAGNOSIS — Z86711 Personal history of pulmonary embolism: Secondary | ICD-10-CM | POA: Diagnosis not present

## 2014-07-08 DIAGNOSIS — Z01818 Encounter for other preprocedural examination: Secondary | ICD-10-CM | POA: Insufficient documentation

## 2014-07-08 LAB — CBC
HEMATOCRIT: 41.4 % (ref 39.0–52.0)
HEMOGLOBIN: 13.8 g/dL (ref 13.0–17.0)
MCH: 30.6 pg (ref 26.0–34.0)
MCHC: 33.3 g/dL (ref 30.0–36.0)
MCV: 91.8 fL (ref 78.0–100.0)
Platelets: 132 10*3/uL — ABNORMAL LOW (ref 150–400)
RBC: 4.51 MIL/uL (ref 4.22–5.81)
RDW: 14.3 % (ref 11.5–15.5)
WBC: 6.8 10*3/uL (ref 4.0–10.5)

## 2014-07-08 LAB — BASIC METABOLIC PANEL
Anion gap: 11 (ref 5–15)
BUN: 12 mg/dL (ref 6–23)
CALCIUM: 9.9 mg/dL (ref 8.4–10.5)
CO2: 29 mEq/L (ref 19–32)
Chloride: 101 mEq/L (ref 96–112)
Creatinine, Ser: 1.06 mg/dL (ref 0.50–1.35)
GFR calc non Af Amer: 66 mL/min — ABNORMAL LOW (ref >90)
GFR, EST AFRICAN AMERICAN: 76 mL/min — AB (ref >90)
GLUCOSE: 145 mg/dL — AB (ref 70–99)
POTASSIUM: 4.5 meq/L (ref 3.7–5.3)
Sodium: 141 mEq/L (ref 137–147)

## 2014-07-08 NOTE — Progress Notes (Signed)
Requested last office visit note from Dr [name].

## 2014-07-08 NOTE — Progress Notes (Signed)
DR Scot Dock - LOV 07/2013 EPIC

## 2014-07-08 NOTE — Progress Notes (Signed)
Last office visit note with DR [name]( PCP) 07/01/2014 on chart

## 2014-07-15 ENCOUNTER — Observation Stay (HOSPITAL_COMMUNITY)
Admission: RE | Admit: 2014-07-15 | Discharge: 2014-07-16 | Disposition: A | Discharge status: Home / Self Care | Payer: Medicare Other | Source: Ambulatory Visit | Attending: Orthopedic Surgery | Admitting: Orthopedic Surgery

## 2014-07-15 ENCOUNTER — Encounter (HOSPITAL_COMMUNITY): Payer: Self-pay | Admitting: *Deleted

## 2014-07-15 ENCOUNTER — Encounter (HOSPITAL_COMMUNITY): Payer: Medicare Other | Admitting: Certified Registered"

## 2014-07-15 ENCOUNTER — Encounter (HOSPITAL_COMMUNITY)
Admission: RE | Disposition: A | Discharge status: Home / Self Care | Payer: Self-pay | Source: Ambulatory Visit | Attending: Orthopedic Surgery

## 2014-07-15 ENCOUNTER — Ambulatory Visit (HOSPITAL_COMMUNITY): Payer: Medicare Other | Admitting: Certified Registered"

## 2014-07-15 DIAGNOSIS — Z79899 Other long term (current) drug therapy: Secondary | ICD-10-CM | POA: Diagnosis not present

## 2014-07-15 DIAGNOSIS — Z85828 Personal history of other malignant neoplasm of skin: Secondary | ICD-10-CM | POA: Insufficient documentation

## 2014-07-15 DIAGNOSIS — Z9089 Acquired absence of other organs: Secondary | ICD-10-CM | POA: Insufficient documentation

## 2014-07-15 DIAGNOSIS — I10 Essential (primary) hypertension: Secondary | ICD-10-CM | POA: Insufficient documentation

## 2014-07-15 DIAGNOSIS — I7389 Other specified peripheral vascular diseases: Secondary | ICD-10-CM | POA: Insufficient documentation

## 2014-07-15 DIAGNOSIS — Z87891 Personal history of nicotine dependence: Secondary | ICD-10-CM | POA: Diagnosis not present

## 2014-07-15 DIAGNOSIS — Z86711 Personal history of pulmonary embolism: Secondary | ICD-10-CM | POA: Diagnosis not present

## 2014-07-15 DIAGNOSIS — M6688 Spontaneous rupture of other tendons, other: Secondary | ICD-10-CM | POA: Diagnosis not present

## 2014-07-15 DIAGNOSIS — E669 Obesity, unspecified: Secondary | ICD-10-CM | POA: Insufficient documentation

## 2014-07-15 DIAGNOSIS — M7061 Trochanteric bursitis, right hip: Secondary | ICD-10-CM | POA: Diagnosis present

## 2014-07-15 DIAGNOSIS — M7070 Other bursitis of hip, unspecified hip: Secondary | ICD-10-CM | POA: Diagnosis present

## 2014-07-15 DIAGNOSIS — Z96659 Presence of unspecified artificial knee joint: Secondary | ICD-10-CM | POA: Diagnosis not present

## 2014-07-15 DIAGNOSIS — E785 Hyperlipidemia, unspecified: Secondary | ICD-10-CM | POA: Diagnosis not present

## 2014-07-15 DIAGNOSIS — K219 Gastro-esophageal reflux disease without esophagitis: Secondary | ICD-10-CM | POA: Diagnosis not present

## 2014-07-15 DIAGNOSIS — M76899 Other specified enthesopathies of unspecified lower limb, excluding foot: Secondary | ICD-10-CM | POA: Diagnosis not present

## 2014-07-15 DIAGNOSIS — Z7901 Long term (current) use of anticoagulants: Secondary | ICD-10-CM | POA: Insufficient documentation

## 2014-07-15 HISTORY — PX: EXCISION/RELEASE BURSA HIP: SHX5014

## 2014-07-15 SURGERY — RELEASE, BURSA, TROCHANTERIC
Anesthesia: General | Site: Hip | Laterality: Right

## 2014-07-15 MED ORDER — EPHEDRINE SULFATE 50 MG/ML IJ SOLN
INTRAMUSCULAR | Status: AC
  Filled 2014-07-15: qty 1

## 2014-07-15 MED ORDER — NEBIVOLOL HCL 10 MG PO TABS
10.0000 mg | ORAL_TABLET | Freq: Every morning | ORAL | Status: DC
  Filled 2014-07-15: qty 1

## 2014-07-15 MED ORDER — LACTATED RINGERS IV SOLN
INTRAVENOUS | Status: DC
  Administered 2014-07-15: 1000 mL via INTRAVENOUS

## 2014-07-15 MED ORDER — HYDROMORPHONE HCL PF 1 MG/ML IJ SOLN
0.2500 mg | INTRAMUSCULAR | Status: DC | PRN
  Administered 2014-07-15 (×4): 0.5 mg via INTRAVENOUS

## 2014-07-15 MED ORDER — SODIUM CHLORIDE 0.9 % IV SOLN: INTRAVENOUS | Status: DC

## 2014-07-15 MED ORDER — MECLIZINE HCL 25 MG PO TABS
25.0000 mg | ORAL_TABLET | Freq: Two times a day (BID) | ORAL | Status: DC | PRN
  Filled 2014-07-15: qty 1

## 2014-07-15 MED ORDER — PROPOFOL 10 MG/ML IV BOLUS
INTRAVENOUS | Status: DC | PRN
  Administered 2014-07-15: 160 mg via INTRAVENOUS

## 2014-07-15 MED ORDER — FENTANYL CITRATE 0.05 MG/ML IJ SOLN
INTRAMUSCULAR | Status: AC
  Filled 2014-07-15: qty 2

## 2014-07-15 MED ORDER — GLYCOPYRROLATE 0.2 MG/ML IJ SOLN
INTRAMUSCULAR | Status: DC | PRN
  Administered 2014-07-15: 0.6 mg via INTRAVENOUS

## 2014-07-15 MED ORDER — ALLOPURINOL 300 MG PO TABS
300.0000 mg | ORAL_TABLET | Freq: Every morning | ORAL | Status: DC
  Filled 2014-07-15: qty 1

## 2014-07-15 MED ORDER — SODIUM CHLORIDE 0.9 % IJ SOLN
INTRAMUSCULAR | Status: DC | PRN
  Administered 2014-07-15: 30 mL via INTRAVENOUS

## 2014-07-15 MED ORDER — SODIUM CHLORIDE 0.9 % IJ SOLN
INTRAMUSCULAR | Status: AC
  Filled 2014-07-15: qty 10

## 2014-07-15 MED ORDER — NEOSTIGMINE METHYLSULFATE 10 MG/10ML IV SOLN
INTRAVENOUS | Status: DC | PRN
  Administered 2014-07-15: 4 mg via INTRAVENOUS

## 2014-07-15 MED ORDER — METOCLOPRAMIDE HCL 10 MG PO TABS: 5.0000 mg | ORAL_TABLET | Freq: Three times a day (TID) | ORAL | Status: DC | PRN

## 2014-07-15 MED ORDER — ATORVASTATIN CALCIUM 40 MG PO TABS
40.0000 mg | ORAL_TABLET | Freq: Every day | ORAL | Status: DC
  Filled 2014-07-15: qty 1

## 2014-07-15 MED ORDER — SODIUM CHLORIDE 0.9 % IV SOLN
INTRAVENOUS | Status: DC
  Administered 2014-07-15 (×2): via INTRAVENOUS

## 2014-07-15 MED ORDER — ONDANSETRON HCL 4 MG PO TABS: 4.0000 mg | ORAL_TABLET | Freq: Four times a day (QID) | ORAL | Status: DC | PRN

## 2014-07-15 MED ORDER — PROPOFOL 10 MG/ML IV BOLUS
INTRAVENOUS | Status: AC
  Filled 2014-07-15: qty 20

## 2014-07-15 MED ORDER — HYDROMORPHONE HCL PF 1 MG/ML IJ SOLN
INTRAMUSCULAR | Status: AC
  Filled 2014-07-15: qty 1

## 2014-07-15 MED ORDER — CEFAZOLIN SODIUM-DEXTROSE 2-3 GM-% IV SOLR
2.0000 g | INTRAVENOUS | Status: AC
  Administered 2014-07-15: 2 g via INTRAVENOUS

## 2014-07-15 MED ORDER — CEFAZOLIN SODIUM-DEXTROSE 2-3 GM-% IV SOLR
INTRAVENOUS | Status: AC
  Filled 2014-07-15: qty 50

## 2014-07-15 MED ORDER — DEXTROSE 5 % IV SOLN
500.0000 mg | Freq: Four times a day (QID) | INTRAVENOUS | Status: DC | PRN
  Administered 2014-07-15: 500 mg via INTRAVENOUS
  Filled 2014-07-15: qty 5

## 2014-07-15 MED ORDER — RIVAROXABAN 10 MG PO TABS
10.0000 mg | ORAL_TABLET | Freq: Once | ORAL | Status: AC
  Administered 2014-07-15: 10 mg via ORAL
  Filled 2014-07-15: qty 1

## 2014-07-15 MED ORDER — METOCLOPRAMIDE HCL 5 MG/ML IJ SOLN: 5.0000 mg | Freq: Three times a day (TID) | INTRAMUSCULAR | Status: DC | PRN

## 2014-07-15 MED ORDER — ROCURONIUM BROMIDE 100 MG/10ML IV SOLN
INTRAVENOUS | Status: AC
  Filled 2014-07-15: qty 1

## 2014-07-15 MED ORDER — OXYCODONE HCL 5 MG PO TABS
5.0000 mg | ORAL_TABLET | ORAL | Status: DC | PRN
  Administered 2014-07-15 – 2014-07-16 (×2): 10 mg via ORAL
  Filled 2014-07-15 (×2): qty 2

## 2014-07-15 MED ORDER — LIDOCAINE HCL (CARDIAC) 20 MG/ML IV SOLN
INTRAVENOUS | Status: DC | PRN
  Administered 2014-07-15: 50 mg via INTRAVENOUS

## 2014-07-15 MED ORDER — 0.9 % SODIUM CHLORIDE (POUR BTL) OPTIME
TOPICAL | Status: DC | PRN
  Administered 2014-07-15: 1000 mL

## 2014-07-15 MED ORDER — RIVAROXABAN 20 MG PO TABS: 20.0000 mg | ORAL_TABLET | Freq: Every day | ORAL | Status: DC

## 2014-07-15 MED ORDER — DEXAMETHASONE SODIUM PHOSPHATE 10 MG/ML IJ SOLN
10.0000 mg | Freq: Once | INTRAMUSCULAR | Status: AC
Start: 2014-07-15 — End: 2014-07-15
  Administered 2014-07-15: 10 mg via INTRAVENOUS

## 2014-07-15 MED ORDER — BUPIVACAINE HCL 0.25 % IJ SOLN
INTRAMUSCULAR | Status: DC | PRN
  Administered 2014-07-15: 30 mL

## 2014-07-15 MED ORDER — DEXAMETHASONE SODIUM PHOSPHATE 10 MG/ML IJ SOLN
INTRAMUSCULAR | Status: AC
  Filled 2014-07-15: qty 1

## 2014-07-15 MED ORDER — MORPHINE SULFATE 2 MG/ML IJ SOLN: 1.0000 mg | INTRAMUSCULAR | Status: DC | PRN

## 2014-07-15 MED ORDER — BUPIVACAINE LIPOSOME 1.3 % IJ SUSP
20.0000 mL | Freq: Once | INTRAMUSCULAR | Status: DC
  Filled 2014-07-15: qty 20

## 2014-07-15 MED ORDER — BUPIVACAINE HCL (PF) 0.25 % IJ SOLN
INTRAMUSCULAR | Status: AC
  Filled 2014-07-15: qty 30

## 2014-07-15 MED ORDER — ACETAMINOPHEN 10 MG/ML IV SOLN
1000.0000 mg | Freq: Once | INTRAVENOUS | Status: AC
  Administered 2014-07-15: 1000 mg via INTRAVENOUS
  Filled 2014-07-15: qty 100

## 2014-07-15 MED ORDER — ONDANSETRON HCL 4 MG/2ML IJ SOLN: 4.0000 mg | Freq: Four times a day (QID) | INTRAMUSCULAR | Status: DC | PRN

## 2014-07-15 MED ORDER — FENTANYL CITRATE 0.05 MG/ML IJ SOLN
INTRAMUSCULAR | Status: DC | PRN
Start: 2014-07-15 — End: 2014-07-15
  Administered 2014-07-15 (×2): 50 ug via INTRAVENOUS

## 2014-07-15 MED ORDER — CITALOPRAM HYDROBROMIDE 20 MG PO TABS
20.0000 mg | ORAL_TABLET | Freq: Every morning | ORAL | Status: DC
  Filled 2014-07-15: qty 1

## 2014-07-15 MED ORDER — ROCURONIUM BROMIDE 100 MG/10ML IV SOLN
INTRAVENOUS | Status: DC | PRN
  Administered 2014-07-15: 25 mg via INTRAVENOUS
  Administered 2014-07-15: 5 mg via INTRAVENOUS

## 2014-07-15 MED ORDER — PROMETHAZINE HCL 25 MG/ML IJ SOLN: 6.2500 mg | INTRAMUSCULAR | Status: DC | PRN

## 2014-07-15 MED ORDER — ONDANSETRON HCL 4 MG/2ML IJ SOLN
INTRAMUSCULAR | Status: AC
  Filled 2014-07-15: qty 2

## 2014-07-15 MED ORDER — MIDAZOLAM HCL 5 MG/5ML IJ SOLN
INTRAMUSCULAR | Status: DC | PRN
  Administered 2014-07-15: 2 mg via INTRAVENOUS

## 2014-07-15 MED ORDER — CHLORHEXIDINE GLUCONATE 4 % EX LIQD: 60.0000 mL | Freq: Once | CUTANEOUS | Status: DC

## 2014-07-15 MED ORDER — CEFAZOLIN SODIUM-DEXTROSE 2-3 GM-% IV SOLR
2.0000 g | Freq: Four times a day (QID) | INTRAVENOUS | Status: AC
  Administered 2014-07-15 (×2): 2 g via INTRAVENOUS
  Filled 2014-07-15 (×2): qty 50

## 2014-07-15 MED ORDER — LIDOCAINE HCL (CARDIAC) 20 MG/ML IV SOLN
INTRAVENOUS | Status: AC
  Filled 2014-07-15: qty 5

## 2014-07-15 MED ORDER — ALPRAZOLAM 0.5 MG PO TABS: 0.5000 mg | ORAL_TABLET | Freq: Every day | ORAL | Status: DC

## 2014-07-15 MED ORDER — BUPIVACAINE LIPOSOME 1.3 % IJ SUSP
INTRAMUSCULAR | Status: DC | PRN
  Administered 2014-07-15: 20 mL

## 2014-07-15 MED ORDER — MIDAZOLAM HCL 2 MG/2ML IJ SOLN
INTRAMUSCULAR | Status: AC
Start: 2014-07-15 — End: 2014-07-15
  Filled 2014-07-15: qty 2

## 2014-07-15 MED ORDER — ONDANSETRON HCL 4 MG/2ML IJ SOLN
INTRAMUSCULAR | Status: DC | PRN
  Administered 2014-07-15: 4 mg via INTRAVENOUS

## 2014-07-15 MED ORDER — NEOSTIGMINE METHYLSULFATE 10 MG/10ML IV SOLN
INTRAVENOUS | Status: AC
  Filled 2014-07-15: qty 1

## 2014-07-15 MED ORDER — SODIUM CHLORIDE 0.9 % IJ SOLN
INTRAMUSCULAR | Status: AC
  Filled 2014-07-15: qty 50

## 2014-07-15 MED ORDER — GLYCOPYRROLATE 0.2 MG/ML IJ SOLN
INTRAMUSCULAR | Status: AC
  Filled 2014-07-15: qty 3

## 2014-07-15 MED ORDER — SUCCINYLCHOLINE CHLORIDE 20 MG/ML IJ SOLN
INTRAMUSCULAR | Status: DC | PRN
  Administered 2014-07-15: 100 mg via INTRAVENOUS

## 2014-07-15 MED ORDER — BELLADONNA ALKALOIDS-OPIUM 16.2-60 MG RE SUPP
RECTAL | Status: AC
  Filled 2014-07-15: qty 1

## 2014-07-15 MED ORDER — METHOCARBAMOL 500 MG PO TABS: 500.0000 mg | ORAL_TABLET | Freq: Four times a day (QID) | ORAL | Status: DC | PRN

## 2014-07-15 MED ORDER — EPHEDRINE SULFATE 50 MG/ML IJ SOLN
INTRAMUSCULAR | Status: DC | PRN
  Administered 2014-07-15: 10 mg via INTRAVENOUS

## 2014-07-15 SURGICAL SUPPLY — 45 items
ANCH SUT 2 CP-2 EBND QANCHR+ (Anchor) ×1 IMPLANT
ANCHOR SUPER QUICK (Anchor) ×2 IMPLANT
BAG SPEC THK2 15X12 ZIP CLS (MISCELLANEOUS)
BAG ZIPLOCK 12X15 (MISCELLANEOUS) IMPLANT
BIT DRILL 2.4X128 (BIT) ×1 IMPLANT
BIT DRILL 2.4X128MM (BIT) ×1
BLADE EXTENDED COATED 6.5IN (ELECTRODE) ×3 IMPLANT
CLOSURE WOUND 1/2 X4 (GAUZE/BANDAGES/DRESSINGS) ×1
DRAPE INCISE IOBAN 66X45 STRL (DRAPES) ×3 IMPLANT
DRAPE ORTHO SPLIT 77X108 STRL (DRAPES) ×6
DRAPE POUCH INSTRU U-SHP 10X18 (DRAPES) ×3 IMPLANT
DRAPE SURG ORHT 6 SPLT 77X108 (DRAPES) ×2 IMPLANT
DRAPE U-SHAPE 47X51 STRL (DRAPES) ×3 IMPLANT
DRSG ADAPTIC 3X8 NADH LF (GAUZE/BANDAGES/DRESSINGS) ×3 IMPLANT
DRSG MEPILEX BORDER 4X4 (GAUZE/BANDAGES/DRESSINGS) IMPLANT
DRSG MEPILEX BORDER 4X8 (GAUZE/BANDAGES/DRESSINGS) ×3 IMPLANT
DURAPREP 26ML APPLICATOR (WOUND CARE) ×3 IMPLANT
ELECT REM PT RETURN 9FT ADLT (ELECTROSURGICAL) ×3
ELECTRODE REM PT RTRN 9FT ADLT (ELECTROSURGICAL) ×1 IMPLANT
GAUZE SPONGE 4X4 12PLY STRL (GAUZE/BANDAGES/DRESSINGS) ×3 IMPLANT
GLOVE BIO SURGEON STRL SZ7.5 (GLOVE) ×3 IMPLANT
GLOVE BIO SURGEON STRL SZ8 (GLOVE) ×3 IMPLANT
GLOVE BIOGEL PI IND STRL 8 (GLOVE) ×2 IMPLANT
GLOVE BIOGEL PI INDICATOR 8 (GLOVE) ×4
GOWN STRL REUS W/TWL LRG LVL3 (GOWN DISPOSABLE) ×3 IMPLANT
GOWN STRL REUS W/TWL XL LVL3 (GOWN DISPOSABLE) ×3 IMPLANT
KIT BASIN OR (CUSTOM PROCEDURE TRAY) ×3 IMPLANT
MANIFOLD NEPTUNE II (INSTRUMENTS) ×5 IMPLANT
NDL SAFETY ECLIPSE 18X1.5 (NEEDLE) ×2 IMPLANT
NEEDLE HYPO 18GX1.5 SHARP (NEEDLE) ×6
NS IRRIG 1000ML POUR BTL (IV SOLUTION) ×3 IMPLANT
PACK TOTAL JOINT (CUSTOM PROCEDURE TRAY) ×3 IMPLANT
PADDING CAST COTTON 6X4 STRL (CAST SUPPLIES) ×2 IMPLANT
PASSER SUT SWANSON 36MM LOOP (INSTRUMENTS) IMPLANT
POSITIONER SURGICAL ARM (MISCELLANEOUS) ×3 IMPLANT
STRIP CLOSURE SKIN 1/2X4 (GAUZE/BANDAGES/DRESSINGS) ×2 IMPLANT
SUT ETHIBOND NAB CT1 #1 30IN (SUTURE) IMPLANT
SUT MNCRL AB 4-0 PS2 18 (SUTURE) ×3 IMPLANT
SUT VIC AB 1 CT1 27 (SUTURE) ×6
SUT VIC AB 1 CT1 27XBRD ANTBC (SUTURE) ×2 IMPLANT
SUT VIC AB 2-0 CT1 27 (SUTURE) ×6
SUT VIC AB 2-0 CT1 TAPERPNT 27 (SUTURE) ×2 IMPLANT
SYRINGE 20CC LL (MISCELLANEOUS) ×3 IMPLANT
SYRINGE 60CC LL (MISCELLANEOUS) ×3 IMPLANT
TOWEL OR 17X26 10 PK STRL BLUE (TOWEL DISPOSABLE) ×3 IMPLANT

## 2014-07-15 NOTE — Op Note (Signed)
Wiley, Christian              ACCOUNT NO.:  [id number]  MEDICAL RECORD NO.:  [id number]  LOCATION:  [facility]                         FACILITY:  [facility]  PHYSICIAN:  Gaynelle Arabian, M.D.    DATE OF BIRTH:  11-27-1935  DATE OF PROCEDURE:  07/15/2014 DATE OF DISCHARGE:                              OPERATIVE REPORT   PREOPERATIVE DIAGNOSIS:  Right hip intractable bursitis with gluteal tendon tear.  POSTOPERATIVE DIAGNOSIS:  Right hip intractable bursitis with gluteal tendon tear.  PROCEDURE:  Right hip bursectomy with gluteal tendon repair.  SURGEON:  Gaynelle Arabian, M.D.  ASSISTANT:  Arlee Muslim, PA-C.  ANESTHESIA:  General.  ESTIMATED BLOOD LOSS:  Minimal.  DRAINS:  None.  COMPLICATIONS:  None.  CONDITION:  Stable to recovery.  BRIEF CLINICAL NOTE:  Mr. Colin is a 79 year old male with severe right lateral hip pain, intractable in nature and longstanding.  He has had injection in both of the bursa and the hip joint.  The bursal injection provided short-term partial relief with the intra-articular injection provided no relief.  He also has had home exercises.  He has had worsening problems and presents now for right hip bursectomy and gluteal tendon repair.  PROCEDURE IN DETAIL:  After successful administration of general anesthetic, the patient was placed in the left lateral decubitus position with the right side up and held with the hip positioner.  Right lower extremity was isolated from perineum with plastic drapes and prepped and draped in the usual sterile fashion.  Short lateral incision was made based over the tip of the greater trochanter.  Skin was cut with a 10 blade through subcutaneous tissue to the fascia lata which was incised in line with the skin incision.  There was a very large inflamed bursa present, and I did excise it with electrocautery.  The gluteus medius tendon and muscle were inspected, and there was a area about 1 x 2 cm right at the  posterolateral tip of the greater trochanter where there is a lipoma within the substance of the muscle.  This was excised and that area was a tear.  We then placed a Mitek anchor into the greater trochanter and passed the sutures through the tear and advanced it down to the bone.  We oversewed it with the sutures and had a very stable repair.  I also palpated the gluteus minimus tendon before we repaired and there was no tear in the gluteus minimus.  Half of the gluteus medius tendon was completely repaired.  Then, the wound was copiously irrigated with saline solution.  A triangle of tissue of the fascia lata which overlie the tip of the greater trochanter was excised. We then closed the fascia lata with running #1 V-Loc proximally and then distally interrupted #1 Vicryl leaving open a small triangular area.  A 20 mL of Exparel mixed with 40 mL of saline was then injected into the gluteal muscles, the fascia lata and the subcu tissues.  Additional 20 mL of 0.25% Marcaine was injected into the same tissues.  The subcutaneous tissues were then closed with interrupted #1 and interrupted 2-0 Vicryl and subcuticular running 4-0 Monocryl.  Incisions were cleaned and dried.  Steri-Strips  and a bulky sterile dressing applied.  He was then awakened and transported to recovery in stable condition.     Gaynelle Arabian, M.D.     FA/MEDQ  D:  07/15/2014  T:  07/15/2014  Job:  [id number]

## 2014-07-15 NOTE — Transfer of Care (Signed)
Immediate Anesthesia Transfer of Care Note  Patient: Christian Wiley  Procedure(s) Performed: Procedure(s): RIGHT HIP BURSECTOMY WITH TENDON REPAIR  (Right)  Patient Location: PACU  Anesthesia Type:General  Level of Consciousness: awake, alert  and oriented  Airway & Oxygen Therapy: Patient Spontanous Breathing and Patient connected to face mask oxygen  Post-op Assessment: Report given to PACU RN and Post -op Vital signs reviewed and stable  Post vital signs: Reviewed and stable  Complications: No apparent anesthesia complications

## 2014-07-15 NOTE — Anesthesia Preprocedure Evaluation (Signed)
Anesthesia Evaluation  Patient identified by MRN, date of birth, ID band Patient awake    Reviewed: Allergy & Precautions, H&P , NPO status , Patient's Chart, lab work & pertinent test results  Airway Mallampati: II TM Distance: >3 FB Neck ROM: Full    Dental no notable dental hx.    Pulmonary neg pulmonary ROS, former smoker,  breath sounds clear to auscultation  Pulmonary exam normal       Cardiovascular hypertension, Pt. on medications and Pt. on home beta blockers + Peripheral Vascular Disease Rhythm:Regular Rate:Normal     Neuro/Psych negative neurological ROS  negative psych ROS   GI/Hepatic Neg liver ROS, GERD-  ,  Endo/Other  negative endocrine ROS  Renal/GU negative Renal ROS  negative genitourinary   Musculoskeletal negative musculoskeletal ROS (+)   Abdominal (+) + obese,   Peds negative pediatric ROS (+)  Hematology negative hematology ROS (+)   Anesthesia Other Findings   Reproductive/Obstetrics negative OB ROS                           Anesthesia Physical Anesthesia Plan  ASA: II  Anesthesia Plan: General   Post-op Pain Management:    Induction: Intravenous  Airway Management Planned: Oral ETT  Additional Equipment:   Intra-op Plan:   Post-operative Plan: Extubation in OR  Informed Consent: I have reviewed the patients History and Physical, chart, labs and discussed the procedure including the risks, benefits and alternatives for the proposed anesthesia with the patient or authorized representative who has indicated his/her understanding and acceptance.   Dental advisory given  Plan Discussed with: CRNA  Anesthesia Plan Comments:         Anesthesia Quick Evaluation

## 2014-07-15 NOTE — Anesthesia Postprocedure Evaluation (Signed)
  Anesthesia Post-op Note  Patient: Christian Wiley  Procedure(s) Performed: Procedure(s) (LRB): RIGHT HIP BURSECTOMY WITH TENDON REPAIR  (Right)  Patient Location: PACU  Anesthesia Type: General  Level of Consciousness: awake and alert   Airway and Oxygen Therapy: Patient Spontanous Breathing  Post-op Pain: mild  Post-op Assessment: Post-op Vital signs reviewed, Patient's Cardiovascular Status Stable, Respiratory Function Stable, Patent Airway and No signs of Nausea or vomiting  Last Vitals:  Filed Vitals:   07/15/14 1618  BP: 155/68  Pulse: 54  Temp: 36.6 C  Resp: 16    Post-op Vital Signs: stable   Complications: No apparent anesthesia complications

## 2014-07-15 NOTE — Brief Op Note (Signed)
07/15/2014  12:21 PM  PATIENT:  Marissa Nestle Bero  79 y.o. male  PRE-OPERATIVE DIAGNOSIS:  RIGHT GLUTEAL TENDON TEAR   POST-OPERATIVE DIAGNOSIS:  RIGHT GLUTEAL TENDON TEAR  PROCEDURE:  Procedure(s): RIGHT HIP BURSECTOMY WITH TENDON REPAIR  (Right)  SURGEON:  Surgeon(s) and Role:    * Gearlean Alf, MD - Primary  PHYSICIAN ASSISTANT:   ASSISTANTS: Arlee Muslim, PA-C   ANESTHESIA:   general  EBL:  Total I/O In: -  Out: 50 [Blood:50]  BLOOD ADMINISTERED:none  DRAINS: none   LOCAL MEDICATIONS USED:  OTHER Expare  COUNTS:  YES  DICTATION: .Other Dictation: Dictation Number [id number]  PLAN OF CARE: Admit for overnight observation  PATIENT DISPOSITION:  PACU - hemodynamically stable.

## 2014-07-15 NOTE — Interval H&P Note (Signed)
History and Physical Interval Note:  07/15/2014 11:27 AM  Christian Wiley  has presented today for surgery, with the diagnosis of RIGHT GLUTEAL TENDON TEAR   The various methods of treatment have been discussed with the patient and family. After consideration of risks, benefits and other options for treatment, the patient has consented to  Procedure(s): Vaughn  (Right) as a surgical intervention .  The patient's history has been reviewed, patient examined, no change in status, stable for surgery.  I have reviewed the patient's chart and labs.  Questions were answered to the patient's satisfaction.     Gearlean Alf

## 2014-07-15 NOTE — H&P (Signed)
CC- Christian Wiley is a 79 y.o. male who presents with right hip pain  Hip Pain: Patient complains of right hip pain. Onset of the symptoms was several months ago. Inciting event: none. Current symptoms include lateral hip pain with sitting and lying on right side. Associated symptoms: none. Aggravating symptoms: pivoting, rising after sitting and lying on right side. Patient's getting worse over time and having trouble sleeping on right side. Patient has had no prior hip problems. Evaluation to date: MRI with partial gluteus medius tendon tear.  Treatment to date: home exercise program, which has been ineffective and cortisone injection in bursa with partial temporary relief and intraarticular with no relief.  Past Medical History  Diagnosis Date  . Arthritis   . AAA (abdominal aortic aneurysm)   . Hx of hiatal hernia   . GERD (gastroesophageal reflux disease)   . Hypertension   . Hyperlipidemia   . Sigmoid diverticulosis   . Pulmonary embolism 2011 and 2014   . Nocturia   . Cancer     hx of skin cancer     Past Surgical History  Procedure Laterality Date  . Abdominal aortic aneurysm repair  2001    endovascular repair  . Cholecystectomy    . Joint replacement      bilateral knee replacement  . Rotator cuff repair      bilateral  . Back surgery      laminectomy  . Back surgery      Prior to Admission medications   Medication Sig Start Date End Date Taking? Authorizing Provider  allopurinol (ZYLOPRIM) 300 MG tablet Take 300 mg by mouth every morning.     Historical Provider, MD  ALPRAZolam Duanne Moron) 0.5 MG tablet Take 0.5 mg by mouth at bedtime.     Historical Provider, MD  atorvastatin (LIPITOR) 40 MG tablet Take 1 tablet by mouth at bedtime.  06/25/13   Historical Provider, MD  citalopram (CELEXA) 20 MG tablet Take 20 mg by mouth every morning.     Historical Provider, MD  docusate sodium (COLACE) 100 MG capsule Take 100 mg by mouth daily as needed for mild constipation.     Historical Provider, MD  HYDROcodone-acetaminophen (NORCO/VICODIN) 5-325 MG per tablet Take 1 tablet by mouth every 6 (six) hours as needed (Pain).  06/14/13   Historical Provider, MD  meclizine (ANTIVERT) 25 MG tablet Take 25 mg by mouth 2 (two) times daily as needed for dizziness.     Historical Provider, MD  Melatonin 10 MG CAPS Take 20 mg by mouth at bedtime.     Historical Provider, MD  nebivolol (BYSTOLIC) 10 MG tablet Take 10 mg by mouth every morning.    Historical Provider, MD  Polyethyl Glycol-Propyl Glycol (SYSTANE OP) Apply 1 drop to eye 3 (three) times daily as needed (Dry eyes).    Historical Provider, MD  Rivaroxaban (XARELTO) 20 MG TABS tablet Take 1 tablet (20 mg total) by mouth daily with supper. 10/25/13   Geoffery Lyons, MD    Physical Examination: General appearance - alert, well appearing, and in no distress Mental status - alert, oriented to person, place, and time Chest - clear to auscultation, no wheezes, rales or rhonchi, symmetric air entry Heart - normal rate, regular rhythm, normal S1, S2, no murmurs, rubs, clicks or gallops Abdomen - soft, nontender, nondistended, no masses or organomegaly Neurological - alert, oriented, normal speech, no focal findings or movement disorder noted  A right hip exam was performed. SKIN: intact SWELLING:  none WARMTH: no warmth TENDERNESS: maximal at greater trochanter ROM: normal STRENGTH: normal GAIT: antalgic  ASSESSMENT:Right hip intractable bursitis and gluteal tendon tear  Plan Right hip bursectomy with gluteal tendon tear. Procedure, risks , potential complications and rehab course discussed with patient who elects to proceed  [name]. [name], MD    07/15/2014, 7:21 AM

## 2014-07-16 DIAGNOSIS — M76899 Other specified enthesopathies of unspecified lower limb, excluding foot: Secondary | ICD-10-CM | POA: Diagnosis not present

## 2014-07-16 MED ORDER — HYDROCODONE-ACETAMINOPHEN 5-325 MG PO TABS
1.0000 | ORAL_TABLET | ORAL | Status: DC | PRN
Start: 2014-07-16 — End: 2014-07-16

## 2014-07-16 MED ORDER — METHOCARBAMOL 500 MG PO TABS: 500.0000 mg | ORAL_TABLET | Freq: Four times a day (QID) | ORAL | Status: DC | PRN

## 2014-07-16 MED ORDER — HYDROCODONE-ACETAMINOPHEN 5-325 MG PO TABS
1.0000 | ORAL_TABLET | ORAL | Status: DC | PRN
Start: 2014-07-16 — End: 2015-09-19

## 2014-07-16 NOTE — Progress Notes (Signed)
   Subjective: 1 Day Post-Op Procedure(s) (LRB): RIGHT HIP BURSECTOMY WITH TENDON REPAIR  (Right) Patient reports pain as mild.   Patient seen in rounds for Dr. Wynelle Link. Patient is well, but has had some minor complaints of pain in the right hip, requiring pain medications Patient is ready to go home  Objective: Vital signs in last 24 hours: Temp:  [97.4 F (36.3 C)-98.2 F (36.8 C)] 97.9 F (36.6 C) (08/08 0618) Pulse Rate:  [51-63] 59 (08/08 0618) Resp:  [10-18] 18 (08/08 0618) BP: (134-185)/(51-82) 149/66 mmHg (08/08 0618) SpO2:  [95 %-100 %] 98 % (08/08 0618) Weight:  [104.327 kg (230 lb)] 104.327 kg (230 lb) (08/07 1415)  Intake/Output from previous day:  Intake/Output Summary (Last 24 hours) at 07/16/14 0704 Last data filed at 07/16/14 8264  Gross per 24 hour  Intake   2280 ml  Output   1450 ml  Net    830 ml    Intake/Output this shift:    Labs: No results found for this basename: HGB,  in the last 72 hours No results found for this basename: WBC, RBC, HCT, PLT,  in the last 72 hours No results found for this basename: NA, K, CL, CO2, BUN, CREATININE, GLUCOSE, CALCIUM,  in the last 72 hours No results found for this basename: LABPT, INR,  in the last 72 hours  EXAM: General - Patient is Alert and Appropriate Extremity - Neurovascular intact Sensation intact distally Dressing - dry, scant dried bloody drainage Motor Function - intact, moving foot and toes well on exam.   Assessment/Plan: 1 Day Post-Op Procedure(s) (LRB): RIGHT HIP BURSECTOMY WITH TENDON REPAIR  (Right) Procedure(s) (LRB): RIGHT HIP BURSECTOMY WITH TENDON REPAIR  (Right) Past Medical History  Diagnosis Date  . Arthritis   . AAA (abdominal aortic aneurysm)   . Hx of hiatal hernia   . GERD (gastroesophageal reflux disease)   . Hypertension   . Hyperlipidemia   . Sigmoid diverticulosis   . Pulmonary embolism 2011 and 2014   . Nocturia   . Cancer     hx of skin cancer    Principal  Problem:   Greater trochanteric bursitis of right hip Active Problems:   Bursitis of hip  Estimated body mass index is 31.19 kg/(m^2) as calculated from the following:   Height as of this encounter: 6' (1.829 m).   Weight as of this encounter: 104.327 kg (230 lb). Discharge home Diet - Cardiac diet Follow up - in 2 weeks Activity - WBAT Disposition - Home Condition Upon Discharge - Good D/C Meds - See DC Summary DVT Prophylaxis - Xarelto as taking at home  Arlee Muslim, PA-C Orthopaedic Surgery 07/16/2014, 7:04 AM

## 2014-07-16 NOTE — Discharge Instructions (Signed)
Pick up stool softner and laxative for home. Do not submerge incision under water. May shower. Continue to use ice for pain and swelling from surgery. Resume Xarelto at home

## 2014-07-16 NOTE — Progress Notes (Signed)
Patient has d/c to home order.  Reviewed discharge paperwork with patient and wife.  Patient and wife verbalized understanding.   Prescriptions given to wife.   Patient escorted from floor with NT via wheelchair.  Christen Bame RN

## 2014-07-16 NOTE — Discharge Summary (Signed)
Physician Discharge Summary   Patient ID: Christian Wiley MRN: [id number] DOB/AGE: [birth year] 79 y.o.  Admit date: 07/15/2014 Discharge date: 07/16/2014  Primary Diagnosis:  Right hip intractable bursitis with gluteal tendon tear.  Admission Diagnoses:  Past Medical History  Diagnosis Date  . Arthritis   . AAA (abdominal aortic aneurysm)   . Hx of hiatal hernia   . GERD (gastroesophageal reflux disease)   . Hypertension   . Hyperlipidemia   . Sigmoid diverticulosis   . Pulmonary embolism 2011 and 2014   . Nocturia   . Cancer     hx of skin cancer    Discharge Diagnoses:   Principal Problem:   Greater trochanteric bursitis of right hip Active Problems:   Bursitis of hip  Estimated body mass index is 31.19 kg/(m^2) as calculated from the following:   Height as of this encounter: 6' (1.829 m).   Weight as of this encounter: 104.327 kg (230 lb).  Procedure(s) (LRB): RIGHT HIP BURSECTOMY WITH TENDON REPAIR  (Right)   Consults: None  HPI: Christian Wiley is a 79 year old male with severe  right lateral hip pain, intractable in nature and longstanding. He has  had injection in both of the bursa and the hip joint. The bursal  injection provided short-term partial relief with the intra-articular  injection provided no relief. He also has had home exercises. He has  had worsening problems and presents now for right hip bursectomy and  gluteal tendon repair.  Laboratory Data: Hospital Outpatient Visit on 07/08/2014  Component Date Value Ref Range Status  . WBC 07/08/2014 6.8  4.0 - 10.5 K/uL Final  . RBC 07/08/2014 4.51  4.22 - 5.81 MIL/uL Final  . Hemoglobin 07/08/2014 13.8  13.0 - 17.0 g/dL Final  . HCT 07/08/2014 41.4  39.0 - 52.0 % Final  . MCV 07/08/2014 91.8  78.0 - 100.0 fL Final  . MCH 07/08/2014 30.6  26.0 - 34.0 pg Final  . MCHC 07/08/2014 33.3  30.0 - 36.0 g/dL Final  . RDW 07/08/2014 14.3  11.5 - 15.5 % Final  . Platelets 07/08/2014 132* 150 - 400 K/uL Final  .  Sodium 07/08/2014 141  137 - 147 mEq/L Final  . Potassium 07/08/2014 4.5  3.7 - 5.3 mEq/L Final  . Chloride 07/08/2014 101  96 - 112 mEq/L Final  . CO2 07/08/2014 29  19 - 32 mEq/L Final  . Glucose, Bld 07/08/2014 145* 70 - 99 mg/dL Final  . BUN 07/08/2014 12  6 - 23 mg/dL Final  . Creatinine, Ser 07/08/2014 1.06  0.50 - 1.35 mg/dL Final  . Calcium 07/08/2014 9.9  8.4 - 10.5 mg/dL Final  . GFR calc non Af Amer 07/08/2014 66* >90 mL/min Final  . GFR calc Af Amer 07/08/2014 76* >90 mL/min Final   Comment: (NOTE)                          The eGFR has been calculated using the CKD EPI equation.                          This calculation has not been validated in all clinical situations.                          eGFR's persistently <90 mL/min signify possible Chronic Kidney  Disease.  . Anion gap 07/08/2014 11  5 - 15 Final     X-Rays:Dg Chest 2 View  07/08/2014   CLINICAL DATA:  Preoperative evaluation for hip surgery; history of pulmonary emboli.  EXAM: CHEST  2 VIEW  COMPARISON:  Chest radiograph November 12, 2010 and chest CT October 01, 2014  FINDINGS: There is mild scarring in the left base, stable. There is underlying emphysematous change. There is no edema or consolidation. The heart size is normal. Pulmonary vascularity reflects underlying emphysematous change. There is no appreciable thoracic adenopathy. No bone lesions.  IMPRESSION: No edema or consolidation.  Underlying emphysematous change.   Electronically Signed   By: Lowella Grip M.D.   On: 07/08/2014 12:44    EKG: Orders placed during the hospital encounter of 10/01/13  . EKG 12-LEAD  . EKG 12-LEAD  . EKG 12-LEAD  . EKG 12-LEAD  . EKG     Hospital Course: Patient was admitted to [facility] and taken to the OR and underwent the above state procedure without complications.  Patient tolerated the procedure well and was later transferred to the recovery room and then to the orthopaedic floor  for postoperative care.  They were given PO and IV analgesics for pain control following their surgery.  They were given 24 hours of postoperative antibiotics of  Anti-infectives   Start     Dose/Rate Route Frequency Ordered Stop   07/15/14 1600  ceFAZolin (ANCEF) IVPB 2 g/50 mL premix     2 g 100 mL/hr over 30 Minutes Intravenous 4 times per day 07/15/14 1416 07/15/14 2347   07/15/14 0946  ceFAZolin (ANCEF) IVPB 2 g/50 mL premix     2 g 100 mL/hr over 30 Minutes Intravenous On call to O.R. 07/15/14 6503 07/15/14 1152     and started on DVT prophylaxis in the form of Xarelto which he was taking at home. The patient was allowed to be WBAT with therapy. Discharge planning was consulted to help with postop disposition and equipment needs.  Patient had a good night on the evening of surgery.  They started to get up OOB with therapy on day one.  Patient was seen in rounds with Dr. Wynelle Link and was ready to go home later that same day.   Discharge home  Diet - Cardiac diet  Follow up - in 2 weeks  Activity - WBAT  Disposition - Home  Condition Upon Discharge - Good  D/C Meds - See DC Summary  DVT Prophylaxis - Xarelto as taking at home   Discharge Instructions   Call MD / Call 911    Complete by:  As directed   If you experience chest pain or shortness of breath, CALL 911 and be transported to the hospital emergency room.  If you develope a fever above 101 F, pus (white drainage) or increased drainage or redness at the wound, or calf pain, call your surgeon's office.     Change dressing    Complete by:  As directed   You may change your dressing dressing daily with sterile 4 x 4 inch gauze dressing and paper tape.  Do not submerge the incision under water.     Constipation Prevention    Complete by:  As directed   Drink plenty of fluids.  Prune juice may be helpful.  You may use a stool softener, such as Colace (over the counter) 100 mg twice a day.  Use MiraLax (over the counter) for  constipation as needed.  Diet - low sodium heart healthy    Complete by:  As directed      Discharge instructions    Complete by:  As directed   Pick up stool softner and laxative for home. Do not submerge incision under water. May shower. Continue to use ice for pain and swelling from surgery. Resume home Xarelto.     Do not sit on low chairs, stoools or toilet seats, as it may be difficult to get up from low surfaces    Complete by:  As directed      Driving restrictions    Complete by:  As directed   No driving until released by the physician.     Increase activity slowly as tolerated    Complete by:  As directed      Lifting restrictions    Complete by:  As directed   No lifting until released by the physician.     Patient may shower    Complete by:  As directed   You may shower without a dressing once there is no drainage.  Do not wash over the wound.  If drainage remains, do not shower until drainage stops.     TED hose    Complete by:  As directed   Use stockings (TED hose) for 3 weeks on both leg(s).  You may remove them at night for sleeping.     Weight bearing as tolerated    Complete by:  As directed             Medication List         allopurinol 300 MG tablet  Commonly known as:  ZYLOPRIM  Take 300 mg by mouth every morning.     ALPRAZolam 0.5 MG tablet  Commonly known as:  XANAX  Take 0.5 mg by mouth at bedtime.     atorvastatin 40 MG tablet  Commonly known as:  LIPITOR  Take 1 tablet by mouth at bedtime.     citalopram 20 MG tablet  Commonly known as:  CELEXA  Take 20 mg by mouth every morning.     docusate sodium 100 MG capsule  Commonly known as:  COLACE  Take 100 mg by mouth daily as needed for mild constipation.     HYDROcodone-acetaminophen 5-325 MG per tablet  Commonly known as:  NORCO/VICODIN  Take 1-2 tablets by mouth every 4 (four) hours as needed for moderate pain.     meclizine 25 MG tablet  Commonly known as:  ANTIVERT  Take  25 mg by mouth 2 (two) times daily as needed for dizziness.     Melatonin 10 MG Caps  Take 20 mg by mouth at bedtime.     methocarbamol 500 MG tablet  Commonly known as:  ROBAXIN  Take 1 tablet (500 mg total) by mouth every 6 (six) hours as needed for muscle spasms.     nebivolol 10 MG tablet  Commonly known as:  BYSTOLIC  Take 10 mg by mouth every morning.     rivaroxaban 20 MG Tabs tablet  Commonly known as:  XARELTO  Take 1 tablet (20 mg total) by mouth daily with supper.     SYSTANE OP  Apply 1 drop to eye 3 (three) times daily as needed (Dry eyes).           Follow-up Information   Follow up with Gearlean Alf, MD. Schedule an appointment as soon as possible for a visit in 2 weeks.   Specialty:  Orthopedic Surgery   Contact information:   [address] [place] [address] [phone]       Signed: Arlee Muslim, PA-C Orthopaedic Surgery 07/16/2014, 7:14 AM

## 2014-07-19 ENCOUNTER — Encounter (HOSPITAL_COMMUNITY): Payer: Self-pay | Admitting: Orthopedic Surgery

## 2014-07-20 ENCOUNTER — Ambulatory Visit (HOSPITAL_COMMUNITY)
Admission: RE | Admit: 2014-07-20 | Discharge: 2014-07-20 | Disposition: A | Discharge status: Home / Self Care | Payer: Medicare Other | Source: Ambulatory Visit | Attending: Cardiovascular Disease | Admitting: Cardiovascular Disease

## 2014-07-20 ENCOUNTER — Other Ambulatory Visit (HOSPITAL_COMMUNITY): Payer: Self-pay | Admitting: *Deleted

## 2014-07-20 DIAGNOSIS — M7989 Other specified soft tissue disorders: Secondary | ICD-10-CM

## 2014-07-20 NOTE — Progress Notes (Signed)
Right Lower Extremity Venous Duplex Completed. No evidence for DVT or SVT. °Brianna L Mazza,RVT °

## 2014-07-27 ENCOUNTER — Telehealth (HOSPITAL_COMMUNITY): Payer: Self-pay | Admitting: *Deleted

## 2015-01-18 ENCOUNTER — Encounter (HOSPITAL_COMMUNITY): Payer: Medicare Other

## 2015-01-18 ENCOUNTER — Ambulatory Visit: Payer: Medicare Other | Admitting: Family

## 2015-02-03 ENCOUNTER — Other Ambulatory Visit: Payer: Self-pay | Admitting: *Deleted

## 2015-02-03 DIAGNOSIS — Z95828 Presence of other vascular implants and grafts: Secondary | ICD-10-CM

## 2015-02-03 DIAGNOSIS — I714 Abdominal aortic aneurysm, without rupture, unspecified: Secondary | ICD-10-CM

## 2015-02-06 ENCOUNTER — Ambulatory Visit: Payer: Medicare Other | Admitting: Family

## 2015-02-06 ENCOUNTER — Other Ambulatory Visit (HOSPITAL_COMMUNITY): Payer: Medicare Other

## 2015-02-07 ENCOUNTER — Encounter: Payer: Self-pay | Admitting: Vascular Surgery

## 2015-02-08 ENCOUNTER — Encounter: Payer: Self-pay | Admitting: Vascular Surgery

## 2015-02-08 ENCOUNTER — Ambulatory Visit (HOSPITAL_COMMUNITY)
Admission: RE | Admit: 2015-02-08 | Discharge: 2015-02-08 | Disposition: A | Discharge status: Home / Self Care | Payer: Medicare Other | Source: Ambulatory Visit | Attending: Vascular Surgery | Admitting: Vascular Surgery

## 2015-02-08 ENCOUNTER — Ambulatory Visit (INDEPENDENT_AMBULATORY_CARE_PROVIDER_SITE_OTHER): Payer: Medicare Other | Admitting: Vascular Surgery

## 2015-02-08 VITALS — BP 125/72 | HR 54 | Ht 72.0 in | Wt 234.0 lb

## 2015-02-08 DIAGNOSIS — I714 Abdominal aortic aneurysm, without rupture, unspecified: Secondary | ICD-10-CM

## 2015-02-08 DIAGNOSIS — Z95828 Presence of other vascular implants and grafts: Secondary | ICD-10-CM

## 2015-02-08 DIAGNOSIS — Z48812 Encounter for surgical aftercare following surgery on the circulatory system: Secondary | ICD-10-CM

## 2015-02-08 NOTE — Addendum Note (Signed)
Addended by: Mena Goes on: 02/08/2015 11:01 AM   Modules accepted: Orders

## 2015-02-08 NOTE — Progress Notes (Signed)
Vascular and Vein Specialist of [facility]  Patient name: Christian Wiley MRN: [id number] DOB: August 01, 1935 Sex: male  REASON FOR VISIT: 99 month follow up visit  HPI: Christian Wiley is a 80 y.o. male who I last saw on 07/14/2013. He underwent endovascular aneurysm repair in 2001 for a 4.8 cm infrarenal abdominal aortic aneurysm. At the time of his last visit the aneurysm was stable in size at 4.8 cm and he was scheduled for an 18 month follow up visit. He comes in for that ultrasound today.  Since I saw him last, he does continue to have some chronic low back pain which is not new. He denies any significant abdominal pain. He describes some claudication in the right thigh which is brought on by ambulation relieved with rest. He has no symptoms on the left side. He denies any history of rest pain or history of nonhealing ulcers.    Past Medical History  Diagnosis Date  . Arthritis   . AAA (abdominal aortic aneurysm)   . Hx of hiatal hernia   . GERD (gastroesophageal reflux disease)   . Hypertension   . Hyperlipidemia   . Sigmoid diverticulosis   . Pulmonary embolism 2011 and 2014   . Nocturia   . Cancer     hx of skin cancer   . PE (pulmonary embolism)    Family History  Problem Relation Age of Onset  . Cancer Sister   . Hyperlipidemia Brother   . Hypertension Daughter   . Hyperlipidemia Mother   . Hypertension Mother   . Hyperlipidemia Father   . Hypertension Father   . Heart disease Father   . Heart attack Father    SOCIAL HISTORY: History  Substance Use Topics  . Smoking status: Former Smoker    Quit date: 10/01/1974  . Smokeless tobacco: Never Used  . Alcohol Use: No   Allergies  Allergen Reactions  . Ambien [Zolpidem Tartrate]     Goes wild, gets anxious and confused with [place]   Current Outpatient Prescriptions  Medication Sig Dispense Refill  . allopurinol (ZYLOPRIM) 300 MG tablet Take 300 mg by mouth every morning.     Marland Kitchen ALPRAZolam (XANAX) 0.5 MG  tablet Take 0.5 mg by mouth at bedtime.     Marland Kitchen atorvastatin (LIPITOR) 40 MG tablet Take 1 tablet by mouth at bedtime.     . citalopram (CELEXA) 20 MG tablet Take 20 mg by mouth every morning.     . docusate sodium (COLACE) 100 MG capsule Take 100 mg by mouth daily as needed for mild constipation.    Marland Kitchen HYDROcodone-acetaminophen (NORCO/VICODIN) 5-325 MG per tablet Take 1-2 tablets by mouth every 4 (four) hours as needed for moderate pain. 80 tablet 0  . meclizine (ANTIVERT) 25 MG tablet Take 25 mg by mouth 2 (two) times daily as needed for dizziness.     . Melatonin 10 MG CAPS Take 20 mg by mouth at bedtime.     . methocarbamol (ROBAXIN) 500 MG tablet Take 1 tablet (500 mg total) by mouth every 6 (six) hours as needed for muscle spasms. 80 tablet 0  . nebivolol (BYSTOLIC) 10 MG tablet Take 10 mg by mouth every morning.    Vladimir Faster Glycol-Propyl Glycol (SYSTANE OP) Apply 1 drop to eye 3 (three) times daily as needed (Dry eyes).    . Rivaroxaban (XARELTO) 20 MG TABS tablet Take 1 tablet (20 mg total) by mouth daily with supper. 30 tablet 3   No current  facility-administered medications for this visit.   REVIEW OF SYSTEMS: Valu.Nieves ] denotes positive finding; [  ] denotes negative finding  CARDIOVASCULAR:  [ ]  chest pain   [ ]  chest pressure   [ ]  palpitations   [ ]  orthopnea   [ ]  dyspnea on exertion   [ X] claudication   [ ]  rest pain   [X]  DVT   [ ]  phlebitis PULMONARY:   [ ]  productive cough   [ ]  asthma   [ ]  wheezing NEUROLOGIC:   [ X] weakness  [ ]  paresthesias  [ ]  aphasia  [ ]  amaurosis  [ ]  dizziness HEMATOLOGIC:   [ ]  bleeding problems   [ ]  clotting disorders MUSCULOSKELETAL:  [ ]  joint pain   [ ]  joint swelling [ ]  leg swelling GASTROINTESTINAL: [ ]   blood in stool  [ ]   hematemesis GENITOURINARY:  [ ]   dysuria  [ ]   hematuria PSYCHIATRIC:  [ ]  history of major depression INTEGUMENTARY:  [ ]  rashes  [ ]  ulcers CONSTITUTIONAL:  [ ]  fever   [ ]  chills  PHYSICAL EXAM: Filed Vitals:    02/08/15 0910  BP: 125/72  Pulse: 54  Height: 6' (1.829 m)  Weight: 234 lb (106.142 kg)  SpO2: 99%   Body mass index is 31.73 kg/(m^2). GENERAL: The patient is a well-nourished male, in no acute distress. The vital signs are documented above. CARDIOVASCULAR: There is a regular rate and rhythm. I do not detect carotid bruits. He has palpable femoral pulses. On the right side he has a palpable posterior tibial pulse. On the left side he has a palpable dorsalis pedis and posterior tibial pulse. PULMONARY: There is good air exchange bilaterally without wheezing or rales. ABDOMEN: Soft and non-tender with normal pitched bowel sounds.  MUSCULOSKELETAL: There are no major deformities or cyanosis. NEUROLOGIC: No focal weakness or paresthesias are detected. SKIN: There are no ulcers or rashes noted. PSYCHIATRIC: The patient has a normal affect.  DATA:  I have independently interpreted his ultrasound of his aorta today which shows that the maximum diameter is 4.8 cm and thus remains stable. This is not changed since his repair in 2001.  MEDICAL ISSUES: The patient is doing well status post endovascular aneurysm repair. I think that it is safe to continue his follow up intervals at 18 months with an ultrasound. There has been no change in the size of his aneurysm. He is on Xeralto and therefore is not on aspirin. He has a history of a pulmonary embolus in the past. I have him scheduled to see our nurse practitioner in 18 months.   Return in about 18 months (around 08/10/2016).   [place] [facility] [place] Beeper: [phone]

## 2015-06-05 ENCOUNTER — Other Ambulatory Visit: Payer: Self-pay

## 2015-06-19 ENCOUNTER — Ambulatory Visit (INDEPENDENT_AMBULATORY_CARE_PROVIDER_SITE_OTHER): Payer: Medicare Other | Admitting: Neurology

## 2015-06-19 ENCOUNTER — Encounter: Payer: Self-pay | Admitting: Neurology

## 2015-06-19 VITALS — BP 118/70 | HR 60 | Ht 72.0 in | Wt 236.6 lb

## 2015-06-19 DIAGNOSIS — W19XXXA Unspecified fall, initial encounter: Secondary | ICD-10-CM

## 2015-06-19 DIAGNOSIS — G214 Vascular parkinsonism: Secondary | ICD-10-CM | POA: Diagnosis not present

## 2015-06-19 DIAGNOSIS — M545 Low back pain, unspecified: Secondary | ICD-10-CM

## 2015-06-19 MED ORDER — CARBIDOPA-LEVODOPA 25-100 MG PO TABS: 1.0000 | ORAL_TABLET | Freq: Three times a day (TID) | ORAL | Status: DC

## 2015-06-19 NOTE — Progress Notes (Signed)
Christian Wiley was seen today in the movement disorders clinic for neurologic consultation at the request of Christian A, MD.  The consultation is for the evaluation of gait change and to r/o PD.  This patient is accompanied in the office by his spouse who supplements the history.   Pt states that he has been seen by his chiropractor, Christian Wiley, for LBP and she recommended he get evaluated by Wiley neurologist as she thought he had parkinsonian features.      Specific Symptoms:  Tremor: Yes.  , right hand (notices it when he is irritated; wife notices it at rest; right hand dominant; present x 1.5-2 years; may have increased slightly with time) Voice: "voice is weaker" Sleep: trouble getting to sleep; once gets there he is able to stay asleep  Vivid Dreams:  No.  Acting out dreams:  Yes.   (screams, "may sock" wife) Wet Pillows: Yes.   Postural symptoms:  Yes.   (sometimes good and sometimes "drunk")  Falls?  Yes.   (fell down Wiley step 3 weeks ago, was adjusting Wiley closure on the storm door to hold it open and lost balance and fell down; prior fall Wiley few months before that and was backing up and fell over the lawn mower) Bradykinesia symptoms: slow movements, difficulty getting out of Wiley chair and difficulty regaining balance Loss of smell:  No. Loss of taste:  No. but decreased appetite but no weight change Urinary Incontinence:  Yes.   (some urge incontinence but does well if times bathroom breaks) Difficulty Swallowing:  Yes.   (gets "choked" wife states on pills) Handwriting, micrographia: No. Trouble with ADL's:  No. (except wife has to put on socks because of back pain)  Trouble buttoning clothing: Yes.   Depression:  Yes.   Memory changes:  Yes.   (pt drives and has no trouble; pts wife sets up pillbox and has done it for Wiley year because pt was getting "mixed up"; wife does finances; they go out for cooking) Hallucinations:  No.  visual distortions: No. (does has  floaters) N/V:  No. Lightheaded:  Yes.   (intermittent when gets up)  Syncope: No. Diplopia:  No. Dyskinesia:  No.  Neuroimaging has previously been performed.  It is not available for my review today.  States that he had Wiley CT brain Wiley few years ago after Wiley fall.      ALLERGIES:   Allergies  Allergen Reactions  . Ambien [Zolpidem Tartrate]     Goes wild, gets anxious and confused with [place]    CURRENT MEDICATIONS:  Outpatient Encounter Prescriptions as of 06/19/2015  Medication Sig  . allopurinol (ZYLOPRIM) 300 MG tablet Take 300 mg by mouth every morning.   Marland Kitchen ALPRAZolam (XANAX) 0.5 MG tablet Take 0.5 mg by mouth at bedtime.   Marland Kitchen amoxicillin (AMOXIL) 500 MG capsule Take 500 mg by mouth as needed. For dental work  . atorvastatin (LIPITOR) 40 MG tablet Take 1 tablet by mouth at bedtime.   . docusate sodium (COLACE) 100 MG capsule Take 100 mg by mouth daily as needed for mild constipation.  . DULoxetine (CYMBALTA) 30 MG capsule Take 30 mg by mouth daily.  Marland Kitchen HYDROcodone-acetaminophen (NORCO/VICODIN) 5-325 MG per tablet Take 1-2 tablets by mouth every 4 (four) hours as needed for moderate pain.  . meclizine (ANTIVERT) 25 MG tablet Take 25 mg by mouth 2 (two) times daily as needed for dizziness.   . Melatonin 10 MG CAPS Take 20  mg by mouth at bedtime.   . methocarbamol (ROBAXIN) 500 MG tablet Take 1 tablet (500 mg total) by mouth every 6 (six) hours as needed for muscle spasms.  . nebivolol (BYSTOLIC) 10 MG tablet Take 10 mg by mouth every morning.  Vladimir Faster Glycol-Propyl Glycol (SYSTANE OP) Apply 1 drop to eye 3 (three) times daily as needed (Dry eyes).  . Rivaroxaban (XARELTO) 20 MG TABS tablet Take 1 tablet (20 mg total) by mouth daily with supper.  . [DISCONTINUED] citalopram (CELEXA) 20 MG tablet Take 20 mg by mouth every morning.    No facility-administered encounter medications on file as of 06/19/2015.    PAST MEDICAL HISTORY:   Past Medical History  Diagnosis Date  .  Arthritis   . AAA (abdominal aortic aneurysm)   . Hx of hiatal hernia   . GERD (gastroesophageal reflux disease)   . Hypertension   . Hyperlipidemia   . Sigmoid diverticulosis   . Pulmonary embolism 2011 and 2014   . Nocturia   . Cancer     hx of skin cancer   . PE (pulmonary embolism)     PAST SURGICAL HISTORY:   Past Surgical History  Procedure Laterality Date  . Abdominal aortic aneurysm repair  2001    endovascular repair  . Cholecystectomy    . Joint replacement      bilateral knee replacement  . Rotator cuff repair      bilateral  . Back surgery      laminectomy  . Back surgery    . Excision/release bursa hip Right 07/15/2014    Procedure: RIGHT HIP BURSECTOMY WITH TENDON REPAIR ;  Surgeon: Gearlean Alf, MD;  Location: [facility];  Service: Orthopedics;  Laterality: Right;  . Cataract extraction, bilateral      SOCIAL HISTORY:   History   Social History  . Marital Status: Married    Spouse Name: N/Wiley  . Number of Children: N/Wiley  . Years of Education: N/Wiley   Occupational History  . Retired     [employer], parts dept   Social History Main Topics  . Smoking status: Former Smoker    Quit date: 10/01/1974  . Smokeless tobacco: Never Used  . Alcohol Use: No  . Drug Use: No  . Sexual Activity: Not Currently   Other Topics Concern  . Not on file   Social History Narrative   Married   2 daughters, 1 son    FAMILY HISTORY:   Family Status  Relation Status Death Age  . Mother Deceased     alzheimer dementia  . Father Deceased     CAD  . Brother Alive     healthy  . Sister Deceased     breast CA  . Sister Alive     DJD  . Child Alive     3, healthy    ROS:  Wiley complete 10 system review of systems was obtained and was unremarkable apart from what is mentioned above.  PHYSICAL EXAMINATION:    VITALS:   Filed Vitals:   06/19/15 1244  BP: 118/70  Pulse: 60  Height: 6' (1.829 m)  Weight: 236 lb 9.6 oz (107.321 kg)    GEN:  The patient  appears stated age and is in NAD. HEENT:  Normocephalic, atraumatic.  The mucous membranes are moist. The superficial temporal arteries are without ropiness or tenderness. CV:  RRR Lungs:  CTAB Neck/HEME:  There are no carotid bruits bilaterally.  Neurological examination:  Orientation: The patient is alert and oriented x3. Fund of knowledge is appropriate.  Recent and remote memory are intact.  Attention and concentration are normal.    Able to name objects and repeat phrases. Cranial nerves: There is good facial symmetry. Pupils are equal round and reactive to light bilaterally. Fundoscopic exam reveals clear margins bilaterally. Extraocular muscles are intact. The visual fields are full to confrontational testing. The speech is fluent and clear. Soft palate rises symmetrically and there is no tongue deviation. Hearing is intact to conversational tone. Sensation: Sensation is intact to light and pinprick throughout (facial, trunk, extremities). Vibration is decreased distally. There is no extinction with double simultaneous stimulation. There is no sensory dermatomal level identified. Motor: Strength is 5/5 in the bilateral upper and lower extremities.   Shoulder shrug is equal and symmetric.  There is no pronator drift. Deep tendon reflexes: Deep tendon reflexes are 1/4 at the bilateral biceps, triceps, brachioradialis, patella and absent at the bilateral achilles. Plantar responses are downgoing bilaterally.  Movement examination: Tone: There is normal tone in the bilateral upper extremities.  The tone in the lower extremities is normal.  Abnormal movements: There is mouth tremor (lower jaw); there is intermittent RUE tremor Coordination:  There is minimal decremation with RAM's, and only seen with alternation of supination/pronation of the forearm on the L Gait and Station: The patient has some difficulty arising out of Wiley deep-seated chair without the use of the hands; he is successful on the  third attempt. The patient's stride length is decreased with Wiley short shuffling gait.    ASSESSMENT/PLAN:  1.  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  He has no rigidity today.  He does have mild intermittent tremor and does have Wiley short shuffling gait.  I talked with the patient about the fact that only about 30% of patients with vascular parkinsonism will respond to levodopa, but generally we do try most patients on the medication.  He and his wife would like to try it.  Risks, benefits, side effects and alternative therapies were discussed.  The opportunity to ask questions was given and they were answered to the best of my ability.  The patient expressed understanding and willingness to follow the outlined treatment protocols.  -will do MRI brain  -talked to him about physical therapy.  His wife felt that this was really going to be Wiley waste of money as they have tried physical therapy in the past (for his back) and while he went to therapy, he would not do the exercises in between time.  -We did talk about safe, cardiovascular exercise.  The patient does go to the gym and rides the bike.  Encouraged him to continue this and to push his exercise safely. 2.  LBP  -The patient has seen Dr. Carloyn Manner and Dr. Nelva Bush along with chiropractics.  They asked me today about his other options. While I did see he had an MRI of his lumbar spine in 2014 that demonstrated moderate to severe neural foraminal stenosis at L4-L5 and significant degenerative changes, I also told them that I really do not evaluate back pain and really only do Parkinson's evaluations.  They are not interested in further surgery or injections and have failed alternative (chiropractic) tx.  Sports med (Dr. Tamala Julian) may be of value but will leave that to discretion of PCP. 3.  Follow up is anticipated in the next few months, sooner should new neurologic issues  arise.  Much greater than  50% of this visit was spent in counseling with the patient and the wife.  Total face to face time:  60 min

## 2015-06-19 NOTE — Patient Instructions (Addendum)
1.  Start carbidopa/levodopa 25/100 as follows:  1/2 tab three times a day before meals x 1 wk, then 1/2 in am & noon & 1 in evening for a week, then 1/2 in am &1 at noon &one in evening for a week, then 1 tablet three times a day before meals 2.  Exercise safely

## 2015-06-19 NOTE — Progress Notes (Signed)
Routed to PCP 

## 2015-06-28 ENCOUNTER — Ambulatory Visit
Admission: RE | Admit: 2015-06-28 | Discharge: 2015-06-28 | Disposition: A | Discharge status: Home / Self Care | Payer: Medicare Other | Source: Ambulatory Visit | Attending: Neurology | Admitting: Neurology

## 2015-06-28 ENCOUNTER — Telehealth: Payer: Self-pay | Admitting: Neurology

## 2015-06-28 DIAGNOSIS — G214 Vascular parkinsonism: Secondary | ICD-10-CM

## 2015-06-28 NOTE — Telephone Encounter (Signed)
-----   Message from [place], DO sent at 06/28/2015  2:17 PM EDT ----- Tell pt MRI brain looked better than I expected.  Hadn't changed much since 2007.  Mild small vessel disease.  How's he doing on levodopa?

## 2015-06-28 NOTE — Telephone Encounter (Signed)
Left message on machine for patient to call back.

## 2015-06-29 NOTE — Telephone Encounter (Signed)
Patient's wife made aware of MR results.   She states patient has not noticed much of a difference with balance and unsure if he notices a difference with tremor with his Levodopa. He is currently only on 1/2 tablet in the morning, 1/2 tablet in the afternoon and 1 tablet in the evening. He will let us know if he still sees no difference after getting on the full dose of medication.

## 2015-06-29 NOTE — Telephone Encounter (Signed)
Pt's wife [name] returned your [phone]

## 2015-09-19 ENCOUNTER — Encounter: Payer: Self-pay | Admitting: Neurology

## 2015-09-19 ENCOUNTER — Ambulatory Visit (INDEPENDENT_AMBULATORY_CARE_PROVIDER_SITE_OTHER): Payer: Medicare Other | Admitting: Neurology

## 2015-09-19 VITALS — BP 120/84 | HR 51 | Ht 72.0 in | Wt 236.0 lb

## 2015-09-19 DIAGNOSIS — R5383 Other fatigue: Secondary | ICD-10-CM

## 2015-09-19 DIAGNOSIS — R001 Bradycardia, unspecified: Secondary | ICD-10-CM

## 2015-09-19 DIAGNOSIS — G214 Vascular parkinsonism: Secondary | ICD-10-CM

## 2015-09-19 NOTE — Progress Notes (Signed)
Note routed to Dr [name].

## 2015-09-19 NOTE — Progress Notes (Signed)
Christian Wiley was seen today in the movement disorders clinic for neurologic consultation at the request of Christian A, MD.  The consultation is for the evaluation of gait change and to r/o PD.  This patient is accompanied in the office by his spouse who supplements the history.   Pt states that he has been seen by his chiropractor, Christian Wiley, for LBP and she recommended he get evaluated by Wiley neurologist as she thought he had parkinsonian features.    09/19/15 update:  Pt returns for f/u accompanied by his wife who supplements the hx.  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  We decided to give him Wiley trial of levodopa.  Today, the patient states that he is only taking it bid instead of the prescribed tid.  He is usually taking it at 10am and 5pm.  Tremor is improved.   He did have an MRI of the brain without gadolinium on 06/28/2015.  There was no significant change compared to his prior MRI of the brain in 2007.  There is evidence of mild small vessel disease.  PT was refused last visit.  He is c/o fatigue.  Has taken his carbidopa/levodopa 25/100 this AM but has not taken his bystolic.  Neuroimaging has previously been performed.  It is not available for my review today.  States that he had Wiley CT brain Wiley few years ago after Wiley fall.      ALLERGIES:   Allergies  Allergen Reactions  . Ambien [Zolpidem Tartrate]     Goes wild, gets anxious and confused with ambien    CURRENT MEDICATIONS:  Outpatient Encounter Prescriptions as of 09/19/2015  Medication Sig  . allopurinol (ZYLOPRIM) 300 MG tablet Take 300 mg by mouth every morning.   Marland Kitchen ALPRAZolam (XANAX) 0.5 MG tablet Take 0.5 mg by mouth at bedtime.   Marland Kitchen atorvastatin (LIPITOR) 40 MG tablet Take 1 tablet by mouth at bedtime.   . carbidopa-levodopa (SINEMET IR) 25-100 MG per tablet Take 1 tablet by mouth 3 (three) times daily. (Patient taking differently: Take 1 tablet by mouth 2 (two) times daily. )  .  docusate sodium (COLACE) 100 MG capsule Take 100 mg by mouth daily as needed for mild constipation.  . DULoxetine (CYMBALTA) 30 MG capsule Take 30 mg by mouth daily.  . meclizine (ANTIVERT) 25 MG tablet Take 25 mg by mouth 2 (two) times daily as needed for dizziness.   . nebivolol (BYSTOLIC) 10 MG tablet Take 10 mg by mouth every morning.  . Rivaroxaban (XARELTO) 20 MG TABS tablet Take 1 tablet (20 mg total) by mouth daily with supper.  Marland Kitchen amoxicillin (AMOXIL) 500 MG capsule Take 500 mg by mouth as needed. For dental work  . methocarbamol (ROBAXIN) 500 MG tablet Take 1 tablet (500 mg total) by mouth every 6 (six) hours as needed for muscle spasms. (Patient not taking: Reported on 09/19/2015)  . [DISCONTINUED] HYDROcodone-acetaminophen (NORCO/VICODIN) 5-325 MG per tablet Take 1-2 tablets by mouth every 4 (four) hours as needed for moderate pain.  . [DISCONTINUED] Melatonin 10 MG CAPS Take 20 mg by mouth at bedtime.   . [DISCONTINUED] Polyethyl Glycol-Propyl Glycol (SYSTANE OP) Apply 1 drop to eye 3 (three) times daily as needed (Dry eyes).   No facility-administered encounter medications on file as of 09/19/2015.    PAST MEDICAL HISTORY:   Past Medical History  Diagnosis Date  . Arthritis   . AAA (abdominal aortic aneurysm) ([place])   . Hx  of hiatal hernia   . GERD (gastroesophageal reflux disease)   . Hypertension   . Hyperlipidemia   . Sigmoid diverticulosis   . Pulmonary embolism ([place]) 2011 and 2014   . Nocturia   . Cancer (HCC)     hx of skin cancer   . PE (pulmonary embolism)     PAST SURGICAL HISTORY:   Past Surgical History  Procedure Laterality Date  . Abdominal aortic aneurysm repair  2001    endovascular repair  . Cholecystectomy    . Joint replacement      bilateral knee replacement  . Rotator cuff repair      bilateral  . Back surgery      laminectomy  . Back surgery    . Excision/release bursa hip Right 07/15/2014    Procedure: RIGHT HIP BURSECTOMY WITH TENDON  REPAIR ;  Surgeon: Gearlean Alf, MD;  Location: [facility];  Service: Orthopedics;  Laterality: Right;  . Cataract extraction, bilateral      SOCIAL HISTORY:   Social History   Social History  . Marital Status: Married    Spouse Name: N/Wiley  . Number of Children: N/Wiley  . Years of Education: N/Wiley   Occupational History  . Retired     [employer], parts dept   Social History Main Topics  . Smoking status: Former Smoker    Quit date: 10/01/1974  . Smokeless tobacco: Never Used  . Alcohol Use: No  . Drug Use: No  . Sexual Activity: Not Currently   Other Topics Concern  . Not on file   Social History Narrative   Married   2 daughters, 1 son    FAMILY HISTORY:   Family Status  Relation Status Death Age  . Mother Deceased     alzheimer dementia  . Father Deceased     CAD  . Brother Alive     healthy  . Sister Deceased     breast CA  . Sister Alive     DJD  . Child Alive     3, healthy    ROS:  Wiley complete 10 system review of systems was obtained and was unremarkable apart from what is mentioned above.  PHYSICAL EXAMINATION:    VITALS:   Filed Vitals:   09/19/15 0838  BP: 120/84  Pulse: 51  Height: 6' (1.829 m)  Weight: 236 lb (107.049 kg)    GEN:  The patient appears stated age and is in NAD. HEENT:  Normocephalic, atraumatic.  The mucous membranes are moist. The superficial temporal arteries are without ropiness or tenderness. CV:  Bradycardic.  Regular rhythm Lungs:  CTAB Neck/HEME:  There are no carotid bruits bilaterally.  Neurological examination:  Orientation: The patient is alert and oriented x3. Fund of knowledge is appropriate.  Recent and remote memory are intact.  Attention and concentration are normal.    Able to name objects and repeat phrases. Cranial nerves: There is good facial symmetry. Pupils are equal round and reactive to light bilaterally. Fundoscopic exam reveals clear margins bilaterally. Extraocular muscles are intact. The visual  fields are full to confrontational testing. The speech is fluent and clear. Soft palate rises symmetrically and there is no tongue deviation. Hearing is intact to conversational tone. Sensation: Sensation is intact to light touch throughout Motor: Strength is 5/5 in the bilateral upper and lower extremities.   Shoulder shrug is equal and symmetric.  There is no pronator drift.   Movement examination: Tone: There is normal tone  in the bilateral upper extremities.  The tone in the lower extremities is normal.  Abnormal movements: There is no tremor noted today Coordination:  There is minimal decremation with RAM's, and only seen with alternation of supination/pronation of the forearm on the L Gait and Station: The patient is mildly short stepped with decreased arm swing on the left.    ASSESSMENT/PLAN:  1.  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  He has no rigidity today.  He does have mild intermittent tremor and does have Wiley short shuffling gait.  His tremor is gone on low dose levodopa.  Will try to get him take it tid (10am/2pm/7pm) and I may increase dosage if needed.    -talked to him about physical therapy. Does not wish to have referral  -We did talk about safe, cardiovascular exercise.  The patient does go to the gym and rides the bike.  Encouraged him to continue this and to push his exercise safely. 2.  Fatigue and bradycardia  -has not taken bystolic today.  Called PCP office and PCP out of office but asked if another dr could work him in.  They will call pt back.  Asked pt to hold bystolic today until they hear back from PCP office.  Call PCP office if they don't receive call today from PCP office 3.  LBP  -The patient has seen Dr. Carloyn Manner and Dr. Nelva Bush along with chiropractics.  They asked me today about his other options. While I did see he had an MRI of his lumbar spine in 2014 that demonstrated moderate to severe  neural foraminal stenosis at L4-L5 and significant degenerative changes, I also told them that I really do not evaluate back pain and really only do Parkinson's evaluations.  They are not interested in further surgery or injections and have failed alternative (chiropractic) tx.  Sports med (Dr. Tamala Julian) may be of value but will leave that to discretion of PCP. 4.  Follow up is anticipated in the next few months, sooner should new neurologic issues arise.  Much greater than 50% of this visit was spent in counseling with the patient and the wife.  Total face to face time:  25 min

## 2015-10-25 ENCOUNTER — Other Ambulatory Visit: Payer: Self-pay | Admitting: Neurology

## 2015-10-25 NOTE — Telephone Encounter (Signed)
Carbidopa Levodopa refill requested. Per last office note- patient to remain on medication. Refill approved and sent to patient's pharmacy.   

## 2015-12-21 ENCOUNTER — Encounter: Payer: Self-pay | Admitting: Neurology

## 2015-12-21 ENCOUNTER — Ambulatory Visit (INDEPENDENT_AMBULATORY_CARE_PROVIDER_SITE_OTHER): Payer: PPO | Admitting: Neurology

## 2015-12-21 VITALS — BP 130/70 | HR 54 | Ht 72.0 in | Wt 231.0 lb

## 2015-12-21 DIAGNOSIS — G20 Parkinson's disease: Secondary | ICD-10-CM | POA: Diagnosis not present

## 2015-12-21 DIAGNOSIS — G214 Vascular parkinsonism: Secondary | ICD-10-CM | POA: Diagnosis not present

## 2015-12-21 DIAGNOSIS — R413 Other amnesia: Secondary | ICD-10-CM | POA: Diagnosis not present

## 2015-12-21 DIAGNOSIS — G4700 Insomnia, unspecified: Secondary | ICD-10-CM | POA: Diagnosis not present

## 2015-12-21 NOTE — Patient Instructions (Addendum)
1.  Decrease hydrocodone to 1 per night and add melatonin, 3 mg at night 2.  Will send a referral to the neurorehab center for physical therapy - give it a try!!

## 2015-12-21 NOTE — Progress Notes (Signed)
Christian Wiley was seen today in the movement disorders clinic for neurologic consultation at the request of [name],[name], MD.  The consultation is for the evaluation of gait change and to r/o PD.  This patient is accompanied in the office by his spouse who supplements the history.   Pt states that he has been seen by his chiropractor, [name], for LBP and she recommended he get evaluated by a neurologist as she thought he had parkinsonian features.    09/19/15 update:  Pt returns for f/u accompanied by his wife who supplements the hx.  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  We decided to give him a trial of levodopa.  Today, the patient states that he is only taking it bid instead of the prescribed tid.  He is usually taking it at 10am and 5pm.  Tremor is improved.   He did have an MRI of the brain without gadolinium on 06/28/2015.  There was no significant change compared to his prior MRI of the brain in 2007.  There is evidence of mild small vessel disease.  PT was refused last visit.  He is c/o fatigue.  Has taken his carbidopa/levodopa 25/100 this AM but has not taken his bystolic.  12/21/15 update:  The patient is following up today, accompanied by his wife who supplements the history.  The patient has history of vascular parkinsonism.  Last visit, he was only taking his levodopa twice per day, and I encouraged him to take it 3 times per day as directed.  His wife states that he still doesn't get his midday dose in time.  He was supposed to be taking the medication at 10am (wake up time)/2 pm/6pm.  He has not wanted to attend physical therapy, but continues to exercise at the gym.  He has not had any falls, but does admit that he "slid" out of the bed trying to get out (got new higher mattress).  No hallucinations.  He has had episodes of confusion and will sometimes ask his wife, "who will keep the kids."  He stopped xanax about a week ago and confusion seems  better.  He takes 2 hydrocodone at bedtime for sleep.  That hasn't helped sleep but he is resistant to giving up the medication. No lightheadedness or near syncope.  Neuroimaging has previously been performed.  It is not available for my review today.  States that he had a CT brain a few years ago after a fall.      ALLERGIES:   Allergies  Allergen Reactions  . Ambien [Zolpidem Tartrate]     Goes wild, gets anxious and confused with [place]    CURRENT MEDICATIONS:  Outpatient Encounter Prescriptions as of 12/21/2015  Medication Sig  . allopurinol (ZYLOPRIM) 300 MG tablet Take 300 mg by mouth every morning.   Marland Kitchen ALPRAZolam (XANAX) 0.5 MG tablet Take 0.5 mg by mouth at bedtime.   Marland Kitchen amLODipine (NORVASC) 5 MG tablet Take 2.5 mg by mouth daily.  Marland Kitchen amoxicillin (AMOXIL) 500 MG capsule Take 500 mg by mouth as needed. For dental work  . atorvastatin (LIPITOR) 40 MG tablet Take 1 tablet by mouth at bedtime.   . carbidopa-levodopa (SINEMET IR) 25-100 MG tablet TAKE 1 TABLET BY MOUTH 3 (THREE) TIMES DAILY.  Marland Kitchen docusate sodium (COLACE) 100 MG capsule Take 100 mg by mouth daily as needed for mild constipation.  . DULoxetine (CYMBALTA) 30 MG capsule Take 30 mg by mouth daily.  Marland Kitchen  HYDROcodone-acetaminophen (NORCO/VICODIN) 5-325 MG tablet TAKE 1 TABLET EVERY 4 HOURS AS NEEDED FOR PAIN  . levothyroxine (SYNTHROID, LEVOTHROID) 25 MCG tablet Take 25 mcg by mouth daily.  . meclizine (ANTIVERT) 25 MG tablet Take 25 mg by mouth 2 (two) times daily as needed for dizziness.   . methocarbamol (ROBAXIN) 500 MG tablet Take 1 tablet (500 mg total) by mouth every 6 (six) hours as needed for muscle spasms.  . nebivolol (BYSTOLIC) 10 MG tablet Take 10 mg by mouth every morning.  . Rivaroxaban (XARELTO) 20 MG TABS tablet Take 1 tablet (20 mg total) by mouth daily with supper.   No facility-administered encounter medications on file as of 12/21/2015.    PAST MEDICAL HISTORY:   Past Medical History  Diagnosis Date  .  Arthritis   . AAA (abdominal aortic aneurysm) ([place])   . Hx of hiatal hernia   . GERD (gastroesophageal reflux disease)   . Hypertension   . Hyperlipidemia   . Sigmoid diverticulosis   . Pulmonary embolism ([place]) 2011 and 2014   . Nocturia   . Cancer (HCC)     hx of skin cancer   . PE (pulmonary embolism)     PAST SURGICAL HISTORY:   Past Surgical History  Procedure Laterality Date  . Abdominal aortic aneurysm repair  2001    endovascular repair  . Cholecystectomy    . Joint replacement      bilateral knee replacement  . Rotator cuff repair      bilateral  . Back surgery      laminectomy  . Back surgery    . Excision/release bursa hip Right 07/15/2014    Procedure: RIGHT HIP BURSECTOMY WITH TENDON REPAIR ;  Surgeon: Gearlean Alf, MD;  Location: [facility];  Service: Orthopedics;  Laterality: Right;  . Cataract extraction, bilateral      SOCIAL HISTORY:   Social History   Social History  . Marital Status: Married    Spouse Name: N/A  . Number of Children: N/A  . Years of Education: N/A   Occupational History  . Retired     [employer], parts dept   Social History Main Topics  . Smoking status: Former Smoker    Quit date: 10/01/1974  . Smokeless tobacco: Never Used  . Alcohol Use: No  . Drug Use: No  . Sexual Activity: Not Currently   Other Topics Concern  . Not on file   Social History Narrative   Married   2 daughters, 1 son    FAMILY HISTORY:   Family Status  Relation Status Death Age  . Mother Deceased     alzheimer dementia  . Father Deceased     CAD  . Brother Alive     healthy  . Sister Deceased     breast CA  . Sister Alive     DJD  . Child Alive     3, healthy    ROS:  A complete 10 system review of systems was obtained and was unremarkable apart from what is mentioned above.  PHYSICAL EXAMINATION:    VITALS:   Filed Vitals:   12/21/15 0854  BP: 130/70  Pulse: 54  Height: 6' (1.829 m)  Weight: 231 lb (104.781 kg)    GEN:   The patient appears stated age and is in NAD. HEENT:  Normocephalic, atraumatic.  The mucous membranes are moist. The superficial temporal arteries are without ropiness or tenderness. CV:  Bradycardic.  Regular rhythm Lungs:  CTAB Neck/HEME:  There are no carotid bruits bilaterally.  Neurological examination:  Orientation: The patient is alert and oriented x3. Fund of knowledge is appropriate.  Recent and remote memory are intact.  Attention and concentration are normal.    Able to name objects and repeat phrases. Cranial nerves: There is good facial symmetry. Pupils are equal round and reactive to light bilaterally. Fundoscopic exam reveals clear margins bilaterally. Extraocular muscles are intact. The visual fields are full to confrontational testing. The speech is fluent and clear. Soft palate rises symmetrically and there is no tongue deviation. Hearing is intact to conversational tone. Sensation: Sensation is intact to light touch throughout Motor: Strength is 5/5 in the bilateral upper and lower extremities.   Shoulder shrug is equal and symmetric.  There is no pronator drift.   Movement examination: Tone: There is normal tone in the bilateral upper extremities.  The tone in the lower extremities is normal.  Abnormal movements: There is mild RUE resting tremor Coordination:  There is minimal decremation with RAM's, and only seen with alternation of supination/pronation of the forearm on the L Gait and Station: The patient is mildly short stepped with decreased arm swing on the left.    ASSESSMENT/PLAN:  1.  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  He has no rigidity today.  He does have mild intermittent tremor and does have a short shuffling gait.  His tremor is gone on low dose levodopa.  Will try to get him take it tid (10am/2pm/7pm) and I may increase dosage if needed.    -talked to him about physical therapy yet  again. He's very resistant but says that he is willing and will refer to the neurorehab center  -We did talk about safe, cardiovascular exercise.  The patient does go to the gym and rides the bike.  Encouraged him to continue this and to push his exercise safely. 2.  Fatigue and bradycardia  -has not taken bystolic today but BP much better today.   3.  Memory Loss and insomnia  -decrease hydrocodone to one per night and add melatonin, 3mg  at night. Memory did improve some with d/c xanax.   Think he has some day night reversal and needs to stay engaged during the night.  -[facility] next visit 4.  LBP  -The patient has seen Dr. Carloyn Manner and Dr. Nelva Bush along with chiropractics.  . While I did see he had an MRI of his lumbar spine in 2014 that demonstrated moderate to severe neural foraminal stenosis at L4-L5 and significant degenerative changes, they are not interested in further surgery or injections and have failed alternative (chiropractic) tx.  Sports med (Dr. Tamala Julian) may be of value but will leave that to discretion of PCP. 5.  Follow up is anticipated in the next few months, sooner should new neurologic issues arise.  Much greater than 50% of this visit was spent in counseling with the patient and the wife.  Total face to face time:  30 min

## 2016-01-16 ENCOUNTER — Ambulatory Visit: Payer: PPO | Attending: Neurology | Admitting: Physical Therapy

## 2016-01-16 DIAGNOSIS — R269 Unspecified abnormalities of gait and mobility: Secondary | ICD-10-CM | POA: Insufficient documentation

## 2016-01-16 DIAGNOSIS — R293 Abnormal posture: Secondary | ICD-10-CM

## 2016-01-16 DIAGNOSIS — R258 Other abnormal involuntary movements: Secondary | ICD-10-CM

## 2016-01-16 DIAGNOSIS — M6281 Muscle weakness (generalized): Secondary | ICD-10-CM | POA: Diagnosis not present

## 2016-01-16 NOTE — Therapy (Signed)
[facility] [address] [place], [place], [address] Phone: [phone]   Fax:  [phone]  Physical Therapy Evaluation  Patient Details  Name: Christian Wiley MRN: [id number] Date of Birth: 1935/04/05 Referring Provider: Alonza Bogus, DO  Encounter Date: 01/16/2016      PT End of Session - 01/16/16 1941    Visit Number 1   Number of Visits 17   Date for PT Re-Evaluation 03/17/16   Authorization Type G-code every 10th visit   PT Start Time 0935   PT Stop Time 1020   PT Time Calculation (min) 45 min   Activity Tolerance Patient tolerated treatment well   Behavior During Therapy [facility] for tasks assessed/performed      Past Medical History  Diagnosis Date  . Arthritis   . AAA (abdominal aortic aneurysm) ([place])   . Hx of hiatal hernia   . GERD (gastroesophageal reflux disease)   . Hypertension   . Hyperlipidemia   . Sigmoid diverticulosis   . Pulmonary embolism ([place]) 2011 and 2014   . Nocturia   . Cancer (HCC)     hx of skin cancer   . PE (pulmonary embolism)     Past Surgical History  Procedure Laterality Date  . Abdominal aortic aneurysm repair  2001    endovascular repair  . Cholecystectomy    . Joint replacement      bilateral knee replacement  . Rotator cuff repair      bilateral  . Back surgery      laminectomy  . Back surgery    . Excision/release bursa hip Right 07/15/2014    Procedure: RIGHT HIP BURSECTOMY WITH TENDON REPAIR ;  Surgeon: Gearlean Alf, MD;  Location: [facility];  Service: Orthopedics;  Laterality: Right;  . Cataract extraction, bilateral      There were no vitals filed for this visit.  Visit Diagnosis:  Abnormality of gait  Postural instability  Muscle weakness of lower extremity  Bradykinesia      Subjective Assessment - 01/16/16 0941    Subjective Pt is a 81 year old male who presents to OP PT following Parkinsonism diagnosis in summer 2016.  He was noticing increased  walking difficulties and has had some balance difficulties, tremor in R hand.  Wife reports 2 falls in the past  87months.  One fall was backwards over the lawnmower, then one was stumbling out the door.  Previous  year was at least 6 falls.     Patient is accompained by: Family member  wife   Pertinent History History of back issues   Limitations Walking  Limited in distance walking ([employer] from parking lot)   Patient Stated Goals Pt's goal for therapy is to be able to get around without falling.   Currently in Pain? --   Pain Score 4    Pain Location Back   Pain Orientation Lower   Pain Type Chronic pain  due to disk bulge in low back per report   Pain Onset More than a month ago   Pain Frequency Constant   Aggravating Factors  walking/standing too long   Pain Relieving Factors pain pills alleviate            [facility] PT Assessment - 01/16/16 0946    Assessment   Medical Diagnosis Vascular Parkinsonism   Referring Provider Alonza Bogus, DO   Onset Date/Surgical Date --  summer 2016-diagnosis   Precautions   Precautions Fall  History of  back surgeries, pain   Balance Screen   Has the patient fallen in the past 6 months Yes   How many times? 2   Has the patient had a decrease in activity level because of a fear of falling?  No   Is the patient reluctant to leave their home because of a fear of falling?  No   Home Environment   Living Environment Private residence   Living Arrangements Spouse/significant other   Available Help at Discharge Family   Type of [place] to enter   Entrance Stairs-Number of Steps 2   Entrance Stairs-Rails Right   [place] One level   Prior Function   Level of Independence Independent with basic ADLs;Independent with household mobility without device   Vocation Retired   Leisure Pt reports not doing a whole lot during the day.  Enjoys tinkering in his shed, yardwork.  Over past 2 years-overall less yardwork    Posture/Postural Control   Posture/Postural Control Postural limitations   Postural Limitations Forward head;Rounded Shoulders   ROM / Strength   AROM / PROM / Strength Strength   Strength   Overall Strength Deficits   Overall Strength Comments grossly tested at least 3+/5 hip flexion, quads and ankle dorsiflexion  4/5    Transfers   Transfers Sit to Stand;Stand to Sit  Wife reports he pushes up from table or chair arms   Sit to Stand 5: Supervision;Without upper extremity assist;From chair/3-in-1  pt prefers armrests   Five time sit to stand comments  33.58   Stand to Sit 5: Supervision;Without upper extremity assist;To chair/3-in-1   Ambulation/Gait   Ambulation/Gait Yes   Ambulation/Gait Assistance 5: Supervision   Ambulation Distance (Feet) 400 Feet   Assistive device None   Gait Pattern Step-through pattern;Decreased arm swing - right;Decreased arm swing - left;Decreased step length - right;Decreased step length - left;Decreased trunk rotation;Narrow base of support;Poor foot clearance - left;Poor foot clearance - right  several episodes L foot drag with gait   Ambulation Surface Level;Indoor   Gait velocity 17.68= 1.86 ft/sec   Gait Comments Pt seems to have decreased step length L vs. R,     6 Minute walk- Post Test   6 Minute Walk Post Test --  3 minute walk:  395 ft   Standardized Balance Assessment   Standardized Balance Assessment Timed Up and Go Test;Four Square Step Test   Four Square Step Test Trial One;Trial Two   Timed Up and Go Test   Normal TUG (seconds) 20.67   Manual TUG (seconds) 19.46   Cognitive TUG (seconds) 19.39   Four Square Step Test    Trial One  26.77   Trial Two 28.2                             PT Short Term Goals - 01/16/16 1948    PT SHORT TERM GOAL #1   Title Pt will perform HEP with family's supervision for improved functional mobility, balance, gait.  TARGET 02/13/16   Baseline 4   Period Weeks   Status New   PT  SHORT TERM GOAL #2   Title Pt will improve 5x sit<>stand test to less than or equal to 25 seconds for improved efficiency and safety with transfers.   Time 4   Period Weeks   Status New   PT SHORT TERM GOAL #3   Title Pt will improve  TUG score to less than or equal to 15 seconds for decreased fall risk.   Time 4   Period Weeks   Status New   PT SHORT TERM GOAL #4   Title Pt will improve 4-square step test to less than or equal to 20 seconds for improved balance, transition stepping.   Time 4   Period Weeks   Status New   PT SHORT TERM GOAL #5   Title Pt will improve 3 minute walk test by at least 50 ft for improved gait efficiency.   Time 4   Period Weeks   Status New           PT Long Term Goals - 01/23/2016 1956    PT LONG TERM GOAL #1   Title Pt/family will verbalize understandng of fall prevention within the home environment.  TARGET 03/17/16   Time 8   Period Weeks   Status New   PT LONG TERM GOAL #2   Title Pt will improve 5x sit<>stand to less than or equal to 20 seconds for improved efficiency and safety with transfers.   Time 8   Period Weeks   Status New   PT LONG TERM GOAL #3   Title Pt will improve gait velocity to at least 2.2 ft/sec for improved gait efficiency and safety.   Time 8   Period Weeks   Status New   PT LONG TERM GOAL #4   Title Pt will ambulate at least 1000 ft indoor and outdoor surfaces, with supervision, device as needed, for improved community gait.   Time 8   Period Weeks   Status New   PT LONG TERM GOAL #5   Title Pt/family will verbalize understanding of local Parkinson's disease resources.   Time 8   Period Weeks   Status New               Plan - 01/23/16 1942    Clinical Impression Statement Pt is a 81 year old male who presents to OP PT with diagnosis of vascular Parkinsonism, with increased balance, gait difficulties, at least one fall in the past 6 months, with difficulty with transfers and long distance gait, which is  limiting going out to eat with his wife.  Pt presents with decreased timing and coordination of gait, decreased balance, decreased functional lower extremity strength.  Pt is at fall risk per TUG, 4-square step test scores.  Pt unable to stand single limb stance >1 second either leg.  Pt c/o back pain and stiffness, which is longstanding, but it aggravated by walking.  Pt would benefit from skilled PT to address the above stated deficits to improve functional mobility and decrease fall risk.   Pt will benefit from skilled therapeutic intervention in order to improve on the following deficits Abnormal gait;Decreased activity tolerance;Decreased balance;Decreased mobility;Decreased strength;Difficulty walking;Impaired flexibility;Postural dysfunction;Pain   Rehab Potential Good   PT Frequency 2x / week   PT Duration 8 weeks  plus eval   PT Treatment/Interventions ADLs/Self Care Home Management;Therapeutic exercise;Therapeutic activities;Functional mobility training;Gait training;Balance training;Neuromuscular re-education;Patient/family education   PT Next Visit Plan Initiate transfer training, HEP for low back flexibility, seated PWR! Moves; gait training activities   Consulted and Agree with Plan of Care Patient;Family member/caregiver   Family Member Consulted wife          G-Codes - 01-23-2016 2000/03/17    Functional Assessment Tool Used TUG 20.67 sec, 5x sit<>stand 33.58 sec, 4-square step test:  26.77 sec, gait velocity 1.86  ft/sec   Functional Limitation Mobility: Walking and moving around   Mobility: Walking and Moving Around Current Status [phone]) At least 40 percent but less than 60 percent impaired, limited or restricted   Mobility: Walking and Moving Around Goal Status [phone]) At least 20 percent but less than 40 percent impaired, limited or restricted       Problem List Patient Active Problem List   Diagnosis Date Noted  . Greater trochanteric bursitis of right hip 07/15/2014  .  Bursitis of hip 07/15/2014  . Pulmonary embolism ([place]) 10/01/2013    Class: Acute  . Thrombocytopenia ([place]) 10/01/2013    Class: Acute  . Hypertension 10/01/2013    Class: Chronic  . Abdominal aneurysm without mention of rupture 01/15/2012    Alka Falwell W. 01/16/2016, 8:03 PM Frazier Butt., PT [facility] [address] [place], [place], [address] Phone: [phone]   Fax:  [phone]  Name: Christian Wiley MRN: [id number] Date of Birth: 28-Jan-1935

## 2016-01-23 ENCOUNTER — Ambulatory Visit: Payer: PPO | Admitting: Physical Therapy

## 2016-01-23 DIAGNOSIS — M6281 Muscle weakness (generalized): Secondary | ICD-10-CM

## 2016-01-23 DIAGNOSIS — R293 Abnormal posture: Secondary | ICD-10-CM

## 2016-01-23 DIAGNOSIS — R269 Unspecified abnormalities of gait and mobility: Secondary | ICD-10-CM | POA: Diagnosis not present

## 2016-01-23 DIAGNOSIS — R258 Other abnormal involuntary movements: Secondary | ICD-10-CM

## 2016-01-23 NOTE — Patient Instructions (Signed)
Sit to Stand Transfers:  1. Scoot out to the edge of the chair 2. Place your feet flat on the floor, shoulder width apart.  Make sure your feet are tucked just under your knees. 3. Lean forward (nose over toes) with momentum, and stand up tall with your best posture.  If you need to use your arms, use them as a quick boost up to stand. 4. If you are in a low or soft chair, you can lean back and then forward up to stand, in order to get more momentum. 5. Once you are standing, make sure you are looking ahead and standing tall.  To sit down:  1. Back up until you feel the chair behind your legs. 2. Bend at you hips, reaching  Back for you chair, if needed, then slowly squat to sit down on your chair.   Knee-to-Chest Stretch: Unilateral    With hand behind right knee, pull knee in to chest until a comfortable stretch is felt in lower back and buttocks. Keep back relaxed. Hold _15-30__ seconds. Repeat _3___ times each leg.  Do _1-2___ sessions per day.  http://orth.exer.us/126   Copyright  VHI. All rights reserved.  HIP / KNEE: Flexion, Knee to Chest - Supine    Start with both knees bent.  Raise one knee to chest. Keep leg in a straight line, then return to start position and lift the other leg.  Alternate lifting legs as in marching. _10__ reps per set, _1-2__ sets per day.  Copyright  VHI. All rights reserved.  Lower Trunk Rotation Stretch    Keeping back flat and feet together, rotate knees to left side. Hold __15-30__ seconds. Repeat __3__ times each side. Do _1-2___ sessions per day.  http://orth.exer.us/122   Copyright  VHI. All rights reserved.

## 2016-01-24 NOTE — Therapy (Signed)
[facility] [address] [place], [place], [address] Phone: [phone]   Fax:  [phone]  Physical Therapy Treatment  Patient Details  Name: Christian Wiley MRN: [id number] Date of Birth: [birth year] Referring Provider: Alonza Bogus, DO  Encounter Date: 01/23/2016      PT End of Session - 01/24/16 0743    Visit Number 2   Number of Visits 17   Date for PT Re-Evaluation 03/17/16   Authorization Type G-code every 10th visit   PT Start Time 0849   PT Stop Time 0930   PT Time Calculation (min) 41 min   Activity Tolerance Patient tolerated treatment well   Behavior During Therapy [facility] for tasks assessed/performed      Past Medical History  Diagnosis Date  . Arthritis   . AAA (abdominal aortic aneurysm) ([place])   . Hx of hiatal hernia   . GERD (gastroesophageal reflux disease)   . Hypertension   . Hyperlipidemia   . Sigmoid diverticulosis   . Pulmonary embolism ([place]) 2011 and 2014   . Nocturia   . Cancer (HCC)     hx of skin cancer   . PE (pulmonary embolism)     Past Surgical History  Procedure Laterality Date  . Abdominal aortic aneurysm repair  2001    endovascular repair  . Cholecystectomy    . Joint replacement      bilateral knee replacement  . Rotator cuff repair      bilateral  . Back surgery      laminectomy  . Back surgery    . Excision/release bursa hip Right 07/15/2014    Procedure: RIGHT HIP BURSECTOMY WITH TENDON REPAIR ;  Surgeon: Gearlean Alf, MD;  Location: [facility];  Service: Orthopedics;  Laterality: Right;  . Cataract extraction, bilateral      There were no vitals filed for this visit.  Visit Diagnosis:  Muscle weakness of lower extremity  Bradykinesia  Postural instability      Subjective Assessment - 01/23/16 0852    Subjective Got me up too early this morning.   Currently in Pain? Yes   Pain Score 3    Pain Location Back   Pain Orientation Lower   Pain Type Chronic  pain   Pain Onset More than a month ago   Pain Frequency Constant   Aggravating Factors  walking/standing too long   Pain Relieving Factors better this morning                         [facility] Adult PT Treatment/Exercise - 01/23/16 0853    Transfers   Transfers Sit to Stand;Stand to Sit   Sit to Stand 5: Supervision;Without upper extremity assist;From chair/3-in-1;With upper extremity assist;From elevated surface   Sit to Stand Details Verbal cues for technique;Verbal cues for sequencing   Stand to Sit 5: Supervision;Without upper extremity assist;To chair/3-in-1;With upper extremity assist;To elevated surface   Number of Reps 10 reps;Other sets (comment)  from 22", 20", then 5 reps from18" surface   Exercises   Exercises Lumbar   Lumbar Exercises: Stretches   Active Hamstring Stretch 3 reps;20 seconds  seated edge of mat, bilaterally   Single Knee to Chest Stretch 3 reps;20 seconds  bilateral   Lower Trunk Rotation 3 reps;20 seconds  each side   Pelvic Tilt 5 reps  cues to initiate for proper technique   Pelvic Tilt Limitations Pt has difficulty sequencing pelvic tilts, appears to  have tightness through low back   Lumbar Exercises: Aerobic   Stationary Bike seated stepper, Level1, lower extremities only x 8 minutes, with RPM >60   Lumbar Exercises: Supine   Other Supine Lumbar Exercises hooklying hip flexion/marching x 10 reps                PT Education - 01/23/16 0921    Education provided Yes   Education Details HEP-see instructions   Person(s) Educated Patient;Spouse   Methods Explanation;Handout;Demonstration   Comprehension Verbalized understanding;Returned demonstration          PT Short Term Goals - 01/16/16 1948    PT SHORT TERM GOAL #1   Title Pt will perform HEP with family's supervision for improved functional mobility, balance, gait.  TARGET 02/13/16   Baseline 4   Period Weeks   Status New   PT SHORT TERM GOAL #2   Title Pt will  improve 5x sit<>stand test to less than or equal to 25 seconds for improved efficiency and safety with transfers.   Time 4   Period Weeks   Status New   PT SHORT TERM GOAL #3   Title Pt will improve TUG score to less than or equal to 15 seconds for decreased fall risk.   Time 4   Period Weeks   Status New   PT SHORT TERM GOAL #4   Title Pt will improve 4-square step test to less than or equal to 20 seconds for improved balance, transition stepping.   Time 4   Period Weeks   Status New   PT SHORT TERM GOAL #5   Title Pt will improve 3 minute walk test by at least 50 ft for improved gait efficiency.   Time 4   Period Weeks   Status New           PT Long Term Goals - 01/16/16 1956    PT LONG TERM GOAL #1   Title Pt/family will verbalize understandng of fall prevention within the home environment.  TARGET 03/17/16   Time 8   Period Weeks   Status New   PT LONG TERM GOAL #2   Title Pt will improve 5x sit<>stand to less than or equal to 20 seconds for improved efficiency and safety with transfers.   Time 8   Period Weeks   Status New   PT LONG TERM GOAL #3   Title Pt will improve gait velocity to at least 2.2 ft/sec for improved gait efficiency and safety.   Time 8   Period Weeks   Status New   PT LONG TERM GOAL #4   Title Pt will ambulate at least 1000 ft indoor and outdoor surfaces, with supervision, device as needed, for improved community gait.   Time 8   Period Weeks   Status New   PT LONG TERM GOAL #5   Title Pt/family will verbalize understanding of local Parkinson's disease resources.   Time 8   Period Weeks   Status New               Plan - 01/24/16 0743    Clinical Impression Statement Initiated HEP today with focus on proper transfer technique/functional lower extremity strengthening, as well as lumbar flexibility exercises in order to assist with improved functional mobility.  Pt will continue to benefit from skilled therapy to improve strength,  flexibility, balance and gait.   Pt will benefit from skilled therapeutic intervention in order to improve on the following deficits Abnormal gait;Decreased  activity tolerance;Decreased balance;Decreased mobility;Decreased strength;Difficulty walking;Impaired flexibility;Postural dysfunction;Pain   Rehab Potential Good   PT Frequency 2x / week   PT Duration 8 weeks  plus eval   PT Treatment/Interventions ADLs/Self Care Home Management;Therapeutic exercise;Therapeutic activities;Functional mobility training;Gait training;Balance training;Neuromuscular re-education;Patient/family education   PT Next Visit Plan Review transfer training and HEP for low back flexibility, initiate seated PWR! Moves; gait training activities   Consulted and Agree with Plan of Care Patient;Family member/caregiver   Family Member Consulted wife        Problem List Patient Active Problem List   Diagnosis Date Noted  . Greater trochanteric bursitis of right hip 07/15/2014  . Bursitis of hip 07/15/2014  . Pulmonary embolism ([place]) 10/01/2013    Class: Acute  . Thrombocytopenia ([place]) 10/01/2013    Class: Acute  . Hypertension 10/01/2013    Class: Chronic  . Abdominal aneurysm without mention of rupture 01/15/2012    Tallan Sandoz W. 01/24/2016, 7:46 AM Frazier Butt., PT [facility] [address] [place], [place], [address] Phone: [phone]   Fax:  [phone]  Name: Christian Wiley MRN: [id number] Date of Birth: 11-01-1935

## 2016-01-25 ENCOUNTER — Ambulatory Visit: Payer: PPO | Admitting: Physical Therapy

## 2016-01-25 DIAGNOSIS — R293 Abnormal posture: Secondary | ICD-10-CM

## 2016-01-25 DIAGNOSIS — R269 Unspecified abnormalities of gait and mobility: Secondary | ICD-10-CM | POA: Diagnosis not present

## 2016-01-25 DIAGNOSIS — R258 Other abnormal involuntary movements: Secondary | ICD-10-CM

## 2016-01-25 NOTE — Therapy (Signed)
[facility] [address] [place], [place], [address] Phone: [phone]   Fax:  [phone]  Physical Therapy Treatment  Patient Details  Name: Christian Wiley MRN: [id number] Date of Birth: 10-Mar-1935 Referring Provider: Alonza Bogus, DO  Encounter Date: 01/25/2016      PT End of Session - 01/25/16 1043    Visit Number 3   Number of Visits 17   Date for PT Re-Evaluation 03/17/16   Authorization Type G-code every 10th visit   PT Start Time 0804   PT Stop Time 0845   PT Time Calculation (min) 41 min   Activity Tolerance Patient tolerated treatment well   Behavior During Therapy [facility] for tasks assessed/performed      Past Medical History  Diagnosis Date  . Arthritis   . AAA (abdominal aortic aneurysm) ([place])   . Hx of hiatal hernia   . GERD (gastroesophageal reflux disease)   . Hypertension   . Hyperlipidemia   . Sigmoid diverticulosis   . Pulmonary embolism ([place]) 2011 and 2014   . Nocturia   . Cancer (HCC)     hx of skin cancer   . PE (pulmonary embolism)     Past Surgical History  Procedure Laterality Date  . Abdominal aortic aneurysm repair  2001    endovascular repair  . Cholecystectomy    . Joint replacement      bilateral knee replacement  . Rotator cuff repair      bilateral  . Back surgery      laminectomy  . Back surgery    . Excision/release bursa hip Right 07/15/2014    Procedure: RIGHT HIP BURSECTOMY WITH TENDON REPAIR ;  Surgeon: Gearlean Alf, MD;  Location: [facility];  Service: Orthopedics;  Laterality: Right;  . Cataract extraction, bilateral      There were no vitals filed for this visit.  Visit Diagnosis:  Bradykinesia  Postural instability  Abnormality of gait      Subjective Assessment - 01/25/16 0854    Subjective Took a day off and didn't do my exercises.   Patient Stated Goals Pt's goal for therapy is to be able to get around without falling.   Currently in Pain?  No/denies  beginning of session                         [place] Adult PT Treatment/Exercise - 01/25/16 0001    Transfers   Transfers Sit to Stand;Stand to Sit   Sit to Stand 5: Supervision;Without upper extremity assist;From chair/3-in-1;With upper extremity assist;From elevated surface   Stand to Sit 5: Supervision;Without upper extremity assist;To chair/3-in-1;With upper extremity assist;To elevated surface   Number of Reps 10 reps;Other sets (comment)  from 20" and 18" surfaces, 10 reps each   Ambulation/Gait   Ambulation/Gait Yes   Ambulation/Gait Assistance 5: Supervision   Ambulation Distance (Feet) 350 Feet  then 230 ft wtih no poles   Assistive device None  bilateral walking poles for arm swing facilitation   Gait Pattern Step-through pattern;Decreased arm swing - right;Decreased arm swing - left;Decreased step length - right;Decreased step length - left;Decreased trunk rotation;Narrow base of support;Poor foot clearance - left;Poor foot clearance - right  decr. foot clearance LLE   Ambulation Surface Level;Indoor   Pre-Gait Activities Forward/back walking at counter, with focus on weigthshifting and incr. step length, marching forward and marching backward, 4 reps each activity, with UE support   Gait Comments HHA  of therapist to improve reciprocal arm swing during second bout of gait   Lumbar Exercises: Stretches   Single Knee to Chest Stretch 3 reps;10 seconds  each leg   Lower Trunk Rotation 5 reps;10 seconds  each side   Lumbar Exercises: Supine   Other Supine Lumbar Exercises hooklying hip flexion/marching x 10 reps           PWR [facility]) - 01/25/16 [id number]    PWR! exercises Moves in sitting   PWR! Up x 10   PWR! Rock x 10   PWR! Twist x 10  modified axial trunk rotation   PWR! Step x 12  edge of mat, one leg out/in   Comments Needs frequent verbal and visual cues, redirection to technique of exercise               PT Short Term Goals -  01/16/16 1948    PT SHORT TERM GOAL #1   Title Pt will perform HEP with family's supervision for improved functional mobility, balance, gait.  TARGET 02/13/16   Baseline 4   Period Weeks   Status New   PT SHORT TERM GOAL #2   Title Pt will improve 5x sit<>stand test to less than or equal to 25 seconds for improved efficiency and safety with transfers.   Time 4   Period Weeks   Status New   PT SHORT TERM GOAL #3   Title Pt will improve TUG score to less than or equal to 15 seconds for decreased fall risk.   Time 4   Period Weeks   Status New   PT SHORT TERM GOAL #4   Title Pt will improve 4-square step test to less than or equal to 20 seconds for improved balance, transition stepping.   Time 4   Period Weeks   Status New   PT SHORT TERM GOAL #5   Title Pt will improve 3 minute walk test by at least 50 ft for improved gait efficiency.   Time 4   Period Weeks   Status New           PT Long Term Goals - 01/16/16 1956    PT LONG TERM GOAL #1   Title Pt/family will verbalize understandng of fall prevention within the home environment.  TARGET 03/17/16   Time 8   Period Weeks   Status New   PT LONG TERM GOAL #2   Title Pt will improve 5x sit<>stand to less than or equal to 20 seconds for improved efficiency and safety with transfers.   Time 8   Period Weeks   Status New   PT LONG TERM GOAL #3   Title Pt will improve gait velocity to at least 2.2 ft/sec for improved gait efficiency and safety.   Time 8   Period Weeks   Status New   PT LONG TERM GOAL #4   Title Pt will ambulate at least 1000 ft indoor and outdoor surfaces, with supervision, device as needed, for improved community gait.   Time 8   Period Weeks   Status New   PT LONG TERM GOAL #5   Title Pt/family will verbalize understanding of local Parkinson's disease resources.   Time 8   Period Weeks   Status New               Plan - 01/25/16 1043    Clinical Impression Statement Pt noted to have improved  transfer ability in today's session despite not fully practicing HEP  at home.  With gait cueing, pt is able to briefly improve step length and foot clearance, but quickly reverts back to narrow BOS, decr. foot clearance, and decr. step length.  Pt will continue to benefit from further skilled PT to address posture, balance, transfers, and gait.   Pt will benefit from skilled therapeutic intervention in order to improve on the following deficits Abnormal gait;Decreased activity tolerance;Decreased balance;Decreased mobility;Decreased strength;Difficulty walking;Impaired flexibility;Postural dysfunction;Pain   Rehab Potential Good   PT Frequency 2x / week   PT Duration 8 weeks  plus eval   PT Treatment/Interventions ADLs/Self Care Home Management;Therapeutic exercise;Therapeutic activities;Functional mobility training;Gait training;Balance training;Neuromuscular re-education;Patient/family education   PT Next Visit Plan Seated and standing PWR! Moves, gait training activities for increased step length, reciprocal arm swing.   Consulted and Agree with Plan of Care Patient;Family member/caregiver   Family Member Consulted wife        Problem List Patient Active Problem List   Diagnosis Date Noted  . Greater trochanteric bursitis of right hip 07/15/2014  . Bursitis of hip 07/15/2014  . Pulmonary embolism ([place]) 10/01/2013    Class: Acute  . Thrombocytopenia ([place]) 10/01/2013    Class: Acute  . Hypertension 10/01/2013    Class: Chronic  . Abdominal aneurysm without mention of rupture 01/15/2012    Rakia Frayne W. 01/25/2016, 10:46 AM  Frazier Butt., PT  [facility] [address] [place], [place], [address] Phone: [phone]   Fax:  [phone]  Name: Christian Wiley MRN: [id number] Date of Birth: 02/17/1935

## 2016-01-31 ENCOUNTER — Ambulatory Visit: Payer: PPO | Admitting: Physical Therapy

## 2016-01-31 DIAGNOSIS — R293 Abnormal posture: Secondary | ICD-10-CM

## 2016-01-31 DIAGNOSIS — R258 Other abnormal involuntary movements: Secondary | ICD-10-CM

## 2016-01-31 DIAGNOSIS — R269 Unspecified abnormalities of gait and mobility: Secondary | ICD-10-CM

## 2016-02-01 NOTE — Patient Instructions (Signed)
PWR! Moves in sitting: -PWR! Up and Rock x 10-20 reps each 1-2 times per day.  Provided handout

## 2016-02-01 NOTE — Therapy (Signed)
[facility] [address] [place], [place], [address] Phone: [phone]   Fax:  [phone]  Physical Therapy Treatment  Patient Details  Name: Christian Wiley MRN: [id number] Date of Birth: 23-Oct-1935 Referring Provider: Alonza Bogus, DO  Encounter Date: 01/31/2016      PT End of Session - 02/01/16 0954    Visit Number 4   Number of Visits 17   Date for PT Re-Evaluation 03/17/16   Authorization Type G-code every 10th visit   PT Start Time 0804   PT Stop Time 0844   PT Time Calculation (min) 40 min   Activity Tolerance Patient tolerated treatment well   Behavior During Therapy [facility] for tasks assessed/performed      Past Medical History  Diagnosis Date  . Arthritis   . AAA (abdominal aortic aneurysm) ([place])   . Hx of hiatal hernia   . GERD (gastroesophageal reflux disease)   . Hypertension   . Hyperlipidemia   . Sigmoid diverticulosis   . Pulmonary embolism ([place]) 2011 and 2014   . Nocturia   . Cancer (HCC)     hx of skin cancer   . PE (pulmonary embolism)     Past Surgical History  Procedure Laterality Date  . Abdominal aortic aneurysm repair  2001    endovascular repair  . Cholecystectomy    . Joint replacement      bilateral knee replacement  . Rotator cuff repair      bilateral  . Back surgery      laminectomy  . Back surgery    . Excision/release bursa hip Right 07/15/2014    Procedure: RIGHT HIP BURSECTOMY WITH TENDON REPAIR ;  Surgeon: Gearlean Alf, MD;  [facility]: [facility];  Service: Orthopedics;  Laterality: Right;  . Cataract extraction, bilateral      There were no vitals filed for this visit.  Visit Diagnosis:  Bradykinesia  Postural instability  Abnormality of gait      Subjective Assessment - 01/31/16 0805    Subjective Did a few of the exercises, not many of them.  No falls.  Wife is delivering [employer] today.   Patient Stated Goals Pt's goal for therapy is to be able  to get around without falling.                         [facility] Adult PT Treatment/Exercise - 01/31/16 0806    Transfers   Transfers Sit to Stand;Stand to Sit   Sit to Stand 5: Supervision;Without upper extremity assist;From chair/3-in-1;With upper extremity assist;From elevated surface   Sit to Stand Details (indicate cue type and reason) Cues initially provided for increased forward lean; pt needs minimal UE support (cues for boosting arms) from armrests at chair   Stand to Sit 5: Supervision;Without upper extremity assist;To chair/3-in-1;With upper extremity assist;To elevated surface   Number of Reps 10 reps;Other sets (comment)  from 22" and then 18" surface   Ambulation/Gait   Ambulation/Gait Yes   Ambulation/Gait Assistance 5: Supervision   Ambulation Distance (Feet) 400 Feet  200   Assistive device None  single walking pole/cane   Gait Pattern Step-through pattern;Decreased arm swing - right;Decreased arm swing - left;Decreased step length - left;Decreased trunk rotation;Narrow base of support;Poor foot clearance - left   Ambulation Surface Level;Indoor   Gait Comments Discussed use of cane and noted improved even step length and improved foot clearance on LLE with use of cane.  P[facility]) - 01/31/16 [id number]    PWR! exercises Moves in sitting;Moves in standing   PWR! Up x 10   PWR! Rock x 10   Comments In standing, also performed rocking with stagger stance, diagonal/forward/back weigthshift-discussed this as a strategy to stand longer with functional/ADL tasks at home.   PWR! Up x 15   PWR! Rock x 15   PWR! Twist x 10  axial trunk rotation, trialed PWR! Twist x 3 reps   PWR! Step x 10  one step out to side with rhythm cues from therapist   Basic 4 Flow Pt has difficulty sequencing start/stop movements with PWR! Twist and PWR! Step activities.  Added PWR! Up and PWR! Rock in sitting to HEP.   Comments Pt has difficulty with start/stop in  middle/proper sequence of trunk rotation activities.             PT Education - 02/01/16 250 007 5125    Education provided Yes   Education Details HEP-PWR! Up and Rock in sitting   Person(s) Educated Patient   Methods Explanation;Demonstration;Handout   Comprehension Verbalized understanding;Returned demonstration;Need further instruction;Verbal cues required          PT Short Term Goals - 01/16/16 1948    PT SHORT TERM GOAL #1   Title Pt will perform HEP with family's supervision for improved functional mobility, balance, gait.  TARGET 02/13/16   Baseline 4   Period Weeks   Status New   PT SHORT TERM GOAL #2   Title Pt will improve 5x sit<>stand test to less than or equal to 25 seconds for improved efficiency and safety with transfers.   Time 4   Period Weeks   Status New   PT SHORT TERM GOAL #3   Title Pt will improve TUG score to less than or equal to 15 seconds for decreased fall risk.   Time 4   Period Weeks   Status New   PT SHORT TERM GOAL #4   Title Pt will improve 4-square step test to less than or equal to 20 seconds for improved balance, transition stepping.   Time 4   Period Weeks   Status New   PT SHORT TERM GOAL #5   Title Pt will improve 3 minute walk test by at least 50 ft for improved gait efficiency.   Time 4   Period Weeks   Status New           PT Long Term Goals - 01/16/16 1956    PT LONG TERM GOAL #1   Title Pt/family will verbalize understandng of fall prevention within the home environment.  TARGET 03/17/16   Time 8   Period Weeks   Status New   PT LONG TERM GOAL #2   Title Pt will improve 5x sit<>stand to less than or equal to 20 seconds for improved efficiency and safety with transfers.   Time 8   Period Weeks   Status New   PT LONG TERM GOAL #3   Title Pt will improve gait velocity to at least 2.2 ft/sec for improved gait efficiency and safety.   Time 8   Period Weeks   Status New   PT LONG TERM GOAL #4   Title Pt will ambulate at  least 1000 ft indoor and outdoor surfaces, with supervision, device as needed, for improved community gait.   Time 8   Period Weeks   Status New   PT LONG TERM GOAL #5   Title Pt/family will verbalize  understanding of local Parkinson's disease resources.   Time 8   Period Weeks   Status New               Plan - 02/01/16 0955    Clinical Impression Statement Skilled physical therapy session today focused on posture, trunk flexibility, weightshifting and stepping activities in sitting and standing positions, with additions to HEP.  Pt noted to have improved even step length and improved foot clearance on LLE with use of cane.  Pt would benefit from further instruction/practice on cane use to improve safety with gait.   Pt will benefit from skilled therapeutic intervention in order to improve on the following deficits Abnormal gait;Decreased activity tolerance;Decreased balance;Decreased mobility;Decreased strength;Difficulty walking;Impaired flexibility;Postural dysfunction;Pain   Rehab Potential Good   PT Frequency 2x / week   PT Duration 8 weeks  plus eval   PT Treatment/Interventions ADLs/Self Care Home Management;Therapeutic exercise;Therapeutic activities;Functional mobility training;Gait training;Balance training;Neuromuscular re-education;Patient/family education   PT Next Visit Plan Review HEP, continue sitting and standing PWR! Moves, weightshifting activities, gait training wtih cane.   Consulted and Agree with Plan of Care Patient;Family member/caregiver   Family Member Consulted wife        Problem List Patient Active Problem List   Diagnosis Date Noted  . Greater trochanteric bursitis of right hip 07/15/2014  . Bursitis of hip 07/15/2014  . Pulmonary embolism ([place]) 10/01/2013    Class: Acute  . Thrombocytopenia ([place]) 10/01/2013    Class: Acute  . Hypertension 10/01/2013    Class: Chronic  . Abdominal aneurysm without mention of rupture 01/15/2012     Leotha Voeltz W. 02/01/2016, 9:59 AM Frazier Butt., PT  [facility] [address] [place], [place], [address] Phone: [phone]   Fax:  [phone]  Name: Christian Wiley MRN: [id number] Date of Birth: Sep 23, 1935

## 2016-02-02 ENCOUNTER — Ambulatory Visit: Payer: PPO | Admitting: Physical Therapy

## 2016-02-02 DIAGNOSIS — R258 Other abnormal involuntary movements: Secondary | ICD-10-CM

## 2016-02-02 DIAGNOSIS — R269 Unspecified abnormalities of gait and mobility: Secondary | ICD-10-CM | POA: Diagnosis not present

## 2016-02-02 DIAGNOSIS — M6281 Muscle weakness (generalized): Secondary | ICD-10-CM

## 2016-02-02 DIAGNOSIS — R293 Abnormal posture: Secondary | ICD-10-CM

## 2016-02-02 NOTE — Therapy (Signed)
[facility] [address] [place], [place], [address] Phone: [phone]   Fax:  [phone]  Physical Therapy Treatment  Patient Details  Name: Christian Wiley MRN: [id number] Date of Birth: 1935-06-19 Referring Provider: Alonza Bogus, DO  Encounter Date: 02/02/2016      PT End of Session - 02/02/16 1044    Visit Number 5   Number of Visits 17   Date for PT Re-Evaluation 03/17/16   Authorization Type G-code every 10th visit   PT Start Time 0850   PT Stop Time 0930   PT Time Calculation (min) 40 min   Activity Tolerance Patient tolerated treatment well   Behavior During Therapy [facility] for tasks assessed/performed      Past Medical History  Diagnosis Date  . Arthritis   . AAA (abdominal aortic aneurysm) ([place])   . Hx of hiatal hernia   . GERD (gastroesophageal reflux disease)   . Hypertension   . Hyperlipidemia   . Sigmoid diverticulosis   . Pulmonary embolism ([place]) 2011 and 2014   . Nocturia   . Cancer (HCC)     hx of skin cancer   . PE (pulmonary embolism)     Past Surgical History  Procedure Laterality Date  . Abdominal aortic aneurysm repair  2001    endovascular repair  . Cholecystectomy    . Joint replacement      bilateral knee replacement  . Rotator cuff repair      bilateral  . Back surgery      laminectomy  . Back surgery    . Excision/release bursa hip Right 07/15/2014    Procedure: RIGHT HIP BURSECTOMY WITH TENDON REPAIR ;  Surgeon: Gearlean Alf, MD;  Location: [facility];  Service: Orthopedics;  Laterality: Right;  . Cataract extraction, bilateral      There were no vitals filed for this visit.  Visit Diagnosis:  Bradykinesia  Postural instability  Muscle weakness of lower extremity  Abnormality of gait      Subjective Assessment - 02/02/16 0853    Subjective Trying to take care of my wife, whose been sick   Patient Stated Goals Pt's goal for therapy is to be able to get around  without falling.   Currently in Pain? Yes   Pain Score 3    Pain Location Back   Pain Orientation Lower   Pain Type Chronic pain   Pain Onset More than a month ago   Pain Frequency Intermittent   Aggravating Factors  walking/standing too long   Pain Relieving Factors Resting                         [facility] Adult PT Treatment/Exercise - 02/02/16 0919    Ambulation/Gait   Ambulation/Gait Yes   Ambulation/Gait Assistance 5: Supervision   Ambulation Distance (Feet) 400 Feet  then 115 ft with cane; 100 ft no cane    Assistive device Straight cane   Gait Pattern Step-through pattern;Decreased arm swing - right;Decreased arm swing - left;Decreased step length - left;Decreased trunk rotation;Narrow base of support;Poor foot clearance - left   Ambulation Surface Level;Indoor   Gait Comments With no cane, cues for increased L foot clearance and step length   Lumbar Exercises: Aerobic   Stationary Bike Seated Stepper, Level 1.2, 4 extremities x 8 minutes with RPM >60 for leg strengthening           PWR ([facility]) - 02/02/16 [id number]  PWR! exercises Moves in sitting;Moves in standing   PWR! Up x 10   PWR! Rock  x 10   PWR! Twist x 8  maximal verbal/visual cues for technqiue   PWR Step x 10 reps   Comments In standing-PWR! Moves performed at chair for intermittent UE support.   PWR! Up x 10   PWR! Rock x 10   PWR! Twist x 5  axial trunk rotation-needs frequent redirection   PWR! Step x 10  improved sequencing of PWR! step in sitting today   Comments PWR! Moves performed in sitting      Pt stops/gets off track with exercises, needing redirection frequently.  Therapist provides visual and verbal cues.         PT Short Term Goals - 01/16/16 1948    PT SHORT TERM GOAL #1   Title Pt will perform HEP with family's supervision for improved functional mobility, balance, gait.  TARGET 02/13/16   Baseline 4   Period Weeks   Status New   PT SHORT TERM GOAL #2   Title Pt  will improve 5x sit<>stand test to less than or equal to 25 seconds for improved efficiency and safety with transfers.   Time 4   Period Weeks   Status New   PT SHORT TERM GOAL #3   Title Pt will improve TUG score to less than or equal to 15 seconds for decreased fall risk.   Time 4   Period Weeks   Status New   PT SHORT TERM GOAL #4   Title Pt will improve 4-square step test to less than or equal to 20 seconds for improved balance, transition stepping.   Time 4   Period Weeks   Status New   PT SHORT TERM GOAL #5   Title Pt will improve 3 minute walk test by at least 50 ft for improved gait efficiency.   Time 4   Period Weeks   Status New           PT Long Term Goals - 01/16/16 1956    PT LONG TERM GOAL #1   Title Pt/family will verbalize understandng of fall prevention within the home environment.  TARGET 03/17/16   Time 8   Period Weeks   Status New   PT LONG TERM GOAL #2   Title Pt will improve 5x sit<>stand to less than or equal to 20 seconds for improved efficiency and safety with transfers.   Time 8   Period Weeks   Status New   PT LONG TERM GOAL #3   Title Pt will improve gait velocity to at least 2.2 ft/sec for improved gait efficiency and safety.   Time 8   Period Weeks   Status New   PT LONG TERM GOAL #4   Title Pt will ambulate at least 1000 ft indoor and outdoor surfaces, with supervision, device as needed, for improved community gait.   Time 8   Period Weeks   Status New   PT LONG TERM GOAL #5   Title Pt/family will verbalize understanding of local Parkinson's disease resources.   Time 8   Period Weeks   Status New               Plan - 02/02/16 [id number]    Clinical Impression Statement Pt's wife not present today, as she is sick per pt report.  Pt continues to have difficulty staying focused on therapy tasks and needs frequent redirection to complete full set of activities  or tasks.  Pt has not yet performed new exercises from HEP given last visit.   Pt would benefit from wife being present at sessions ofr maximal carryover with gait and exercise activities.   Pt will benefit from skilled therapeutic intervention in order to improve on the following deficits Abnormal gait;Decreased activity tolerance;Decreased balance;Decreased mobility;Decreased strength;Difficulty walking;Impaired flexibility;Postural dysfunction;Pain   Rehab Potential Good   PT Frequency 2x / week   PT Duration 8 weeks  plus eval   PT Treatment/Interventions ADLs/Self Care Home Management;Therapeutic exercise;Therapeutic activities;Functional mobility training;Gait training;Balance training;Neuromuscular re-education;Patient/family education   PT Next Visit Plan Continue gait training with cane; discuss fall prevention/cueing strategies with patient/wife; discuss community fitness; continue to work on [place] and balance   Consulted and Agree with Plan of Care Patient;Family member/caregiver   Family Member Consulted wife        Problem List Patient Active Problem List   Diagnosis Date Noted  . Greater trochanteric bursitis of right hip 07/15/2014  . Bursitis of hip 07/15/2014  . Pulmonary embolism ([place]) 10/01/2013    Class: Acute  . Thrombocytopenia ([place]) 10/01/2013    Class: Acute  . Hypertension 10/01/2013    Class: Chronic  . Abdominal aneurysm without mention of rupture 01/15/2012    Clara Herbison W. 02/02/2016, 10:45 AM  Frazier Butt., PT  [facility] [address] [place], [place], [address] Phone: [phone]   Fax:  [phone]  Name: Christian Wiley MRN: [id number] Date of Birth: [birth year]

## 2016-02-06 ENCOUNTER — Ambulatory Visit: Payer: PPO | Admitting: Physical Therapy

## 2016-02-06 DIAGNOSIS — R269 Unspecified abnormalities of gait and mobility: Secondary | ICD-10-CM | POA: Diagnosis not present

## 2016-02-06 DIAGNOSIS — R258 Other abnormal involuntary movements: Secondary | ICD-10-CM

## 2016-02-06 DIAGNOSIS — R293 Abnormal posture: Secondary | ICD-10-CM

## 2016-02-06 NOTE — Therapy (Signed)
[facility] [address] [place], [place], [address] Phone: [phone]   Fax:  [phone]  Physical Therapy Treatment  Patient Details  Name: Christian Wiley MRN: [id number] Date of Birth: 08/18/1935 Referring Provider: Alonza Bogus, DO  Encounter Date: 02/06/2016      PT End of Session - 02/06/16 1538    Visit Number 6   Number of Visits 17   Date for PT Re-Evaluation 03/17/16   Authorization Type G-code every 10th visit   PT Start Time 0848   PT Stop Time 0927   PT Time Calculation (min) 39 min   Activity Tolerance Patient tolerated treatment well   Behavior During Therapy [facility] for tasks assessed/performed      Past Medical History  Diagnosis Date  . Arthritis   . AAA (abdominal aortic aneurysm) ([place])   . Hx of hiatal hernia   . GERD (gastroesophageal reflux disease)   . Hypertension   . Hyperlipidemia   . Sigmoid diverticulosis   . Pulmonary embolism ([place]) 2011 and 2014   . Nocturia   . Cancer (HCC)     hx of skin cancer   . PE (pulmonary embolism)     Past Surgical History  Procedure Laterality Date  . Abdominal aortic aneurysm repair  2001    endovascular repair  . Cholecystectomy    . Joint replacement      bilateral knee replacement  . Rotator cuff repair      bilateral  . Back surgery      laminectomy  . Back surgery    . Excision/release bursa hip Right 07/15/2014    Procedure: RIGHT HIP BURSECTOMY WITH TENDON REPAIR ;  Surgeon: Gearlean Alf, MD;  Location: [facility];  Service: Orthopedics;  Laterality: Right;  . Cataract extraction, bilateral      There were no vitals filed for this visit.  Visit Diagnosis:  Postural instability  Bradykinesia      Subjective Assessment - 02/06/16 0849    Subjective No changes since last visit; no falls   Patient Stated Goals Pt's goal for therapy is to be able to get around without falling.   Currently in Pain? Yes   Pain Score 7    Pain  Location Back   Pain Orientation Lower   Pain Descriptors / Indicators Aching   Pain Type Chronic pain   Pain Onset More than a month ago   Pain Frequency Intermittent   Aggravating Factors  walking/standing too long   Pain Relieving Factors Resting alleviates                         [facility] Adult PT Treatment/Exercise - 02/06/16 0858    High Level Balance   High Level Balance Activities Side stepping;Backward walking  Forward/back; along counter 4 reps each direction   High Level Balance Comments At counter:  lateral weightshifting x 10 reps, then lateral weightshifting wtih UE reaching x 10 reps; stagger stance forward/back weightshifting x 10 reps each foot position with UE support; side step and weightshift x 10 reps each leg with UE support; backward step and weightshift x 10 reps each leg with UE support, then forward step and weightshift x 10 reps with UE support, each leg;    Self-Care   Self-Care Other Self-Care Comments   Other Self-Care Comments  Provided pt with handouts for standing HEP-reiterated importance of conistently performing HEP.  Discused HEP as way of helping to  manage pain.  At beginning of session, pt rates pain as 7/10, at end of session after exercises, pt rates pain as 0/10.  Discussed benefit of having wife return to therapy sessions (when well) in order to help with reinforcement of HEP performance at home.           PWR [facility]) - 02/06/16 [id number]    PWR! Up x 10   PWR! Rock x 10   PWR! Twist x 5    In sitting, review of HEP and warm up prior to standing exercises.  Pt requires occasional cues for large amplitude, deliberate movement.         PT Education - 02/06/16 0925    Education provided Yes   Education Details Standing counter exercises-see instructions   Person(s) Educated Patient   Methods Explanation;Demonstration;Handout   Comprehension Verbalized understanding;Returned demonstration;Verbal cues required;Need further  instruction          PT Short Term Goals - 01/16/16 1948    PT SHORT TERM GOAL #1   Title Pt will perform HEP with family's supervision for improved functional mobility, balance, gait.  TARGET 02/13/16   Baseline 4   Period Weeks   Status New   PT SHORT TERM GOAL #2   Title Pt will improve 5x sit<>stand test to less than or equal to 25 seconds for improved efficiency and safety with transfers.   Time 4   Period Weeks   Status New   PT SHORT TERM GOAL #3   Title Pt will improve TUG score to less than or equal to 15 seconds for decreased fall risk.   Time 4   Period Weeks   Status New   PT SHORT TERM GOAL #4   Title Pt will improve 4-square step test to less than or equal to 20 seconds for improved balance, transition stepping.   Time 4   Period Weeks   Status New   PT SHORT TERM GOAL #5   Title Pt will improve 3 minute walk test by at least 50 ft for improved gait efficiency.   Time 4   Period Weeks   Status New           PT Long Term Goals - 01/16/16 1956    PT LONG TERM GOAL #1   Title Pt/family will verbalize understandng of fall prevention within the home environment.  TARGET 03/17/16   Time 8   Period Weeks   Status New   PT LONG TERM GOAL #2   Title Pt will improve 5x sit<>stand to less than or equal to 20 seconds for improved efficiency and safety with transfers.   Time 8   Period Weeks   Status New   PT LONG TERM GOAL #3   Title Pt will improve gait velocity to at least 2.2 ft/sec for improved gait efficiency and safety.   Time 8   Period Weeks   Status New   PT LONG TERM GOAL #4   Title Pt will ambulate at least 1000 ft indoor and outdoor surfaces, with supervision, device as needed, for improved community gait.   Time 8   Period Weeks   Status New   PT LONG TERM GOAL #5   Title Pt/family will verbalize understanding of local Parkinson's disease resources.   Time 8   Period Weeks   Status New               Plan - 02/06/16 1538    Clinical  Impression Statement  Pt has reports of no pain at end of session (vs 7/10 at beginning of session), following standing balance and weigthshifting exercises at counter.  Added several of these exercises for HEP.  Pt will continue to benefit from further skilled PT to address balance and gait.   Pt will benefit from skilled therapeutic intervention in order to improve on the following deficits Abnormal gait;Decreased activity tolerance;Decreased balance;Decreased mobility;Decreased strength;Difficulty walking;Impaired flexibility;Postural dysfunction;Pain   Rehab Potential Good   PT Frequency 2x / week   PT Duration 8 weeks  plus eval   PT Treatment/Interventions ADLs/Self Care Home Management;Therapeutic exercise;Therapeutic activities;Functional mobility training;Gait training;Balance training;Neuromuscular re-education;Patient/family education   PT Next Visit Plan Review HEP; continue gait training with cane; discuss fall prevention/cueing strategies with patient/wife; discuss community fitness   Consulted and Agree with Plan of Care Patient   Family Member Consulted wife        Problem List Patient Active Problem List   Diagnosis Date Noted  . Greater trochanteric bursitis of right hip 07/15/2014  . Bursitis of hip 07/15/2014  . Pulmonary embolism ([place]) 10/01/2013    Class: Acute  . Thrombocytopenia ([place]) 10/01/2013    Class: Acute  . Hypertension 10/01/2013    Class: Chronic  . Abdominal aneurysm without mention of rupture 01/15/2012    Aiyonna Lucado W. 02/06/2016, 3:41 PM  Frazier Butt., PT  [facility] [address] [place], [place], [address] Phone: [phone]   Fax:  [phone]  Name: Christian Wiley MRN: [id number] Date of Birth: 1934/12/28

## 2016-02-06 NOTE — Patient Instructions (Addendum)
Single Step: Forward ONLY    Stand beside the counter and hold on with support. Lifting foot off floor, take one step slowly forward with right leg. Return to starting position. Repeat 10 times on right leg.  Then repeat forward step with left leg.   Repeat __10__ times each leg per session. Do __1-2__ sessions per day.   Copyright  VHI. All rights reserved.  Single Step: Side    Stand and face the counter, holding on for support.  Lifting foot off floor, take one step slowly to left side. Return to starting position. Repeat __10__ times per session. Do _1-2___ sessions per day. Repeat to opposite side.  Copyright  VHI. All rights reserved.  Weight Shift: Lateral (Limits of Stability)    Stand with your feet at least shoulder width apart.  Slowly shift weight to right as far as possible, without taking a step, then shift to opposite side. Hold each position _2-3__ seconds. Repeat _10___ times per session. Do _1-2___ sessions per day.   Copyright  VHI. All rights reserved.

## 2016-02-08 ENCOUNTER — Ambulatory Visit: Payer: PPO | Attending: Neurology | Admitting: Physical Therapy

## 2016-02-08 DIAGNOSIS — R293 Abnormal posture: Secondary | ICD-10-CM | POA: Insufficient documentation

## 2016-02-08 DIAGNOSIS — M6281 Muscle weakness (generalized): Secondary | ICD-10-CM

## 2016-02-08 DIAGNOSIS — R258 Other abnormal involuntary movements: Secondary | ICD-10-CM | POA: Insufficient documentation

## 2016-02-08 DIAGNOSIS — R269 Unspecified abnormalities of gait and mobility: Secondary | ICD-10-CM | POA: Diagnosis not present

## 2016-02-08 NOTE — Therapy (Signed)
[facility] [address] [place], [place], [address] Phone: [phone]   Fax:  [phone]  Physical Therapy Treatment  Patient Details  Name: Christian Wiley MRN: [id number] Date of Birth: 1935/07/15 Referring Provider: Alonza Bogus, DO  Encounter Date: 02/08/2016      PT End of Session - 02/08/16 1405    Visit Number 7   Number of Visits 17   Date for PT Re-Evaluation 03/17/16   Authorization Type G-code every 10th visit   PT Start Time 0937   PT Stop Time 1017   PT Time Calculation (min) 40 min   Activity Tolerance Patient tolerated treatment well   Behavior During Therapy [facility] for tasks assessed/performed      Past Medical History  Diagnosis Date  . Arthritis   . AAA (abdominal aortic aneurysm) ([place])   . Hx of hiatal hernia   . GERD (gastroesophageal reflux disease)   . Hypertension   . Hyperlipidemia   . Sigmoid diverticulosis   . Pulmonary embolism ([place]) 2011 and 2014   . Nocturia   . Cancer (HCC)     hx of skin cancer   . PE (pulmonary embolism)     Past Surgical History  Procedure Laterality Date  . Abdominal aortic aneurysm repair  2001    endovascular repair  . Cholecystectomy    . Joint replacement      bilateral knee replacement  . Rotator cuff repair      bilateral  . Back surgery      laminectomy  . Back surgery    . Excision/release bursa hip Right 07/15/2014    Procedure: RIGHT HIP BURSECTOMY WITH TENDON REPAIR ;  Surgeon: Gearlean Alf, MD;  Location: [facility];  Service: Orthopedics;  Laterality: Right;  . Cataract extraction, bilateral      There were no vitals filed for this visit.  Visit Diagnosis:  Postural instability  Bradykinesia  Muscle weakness of lower extremity  Abnormality of gait      Subjective Assessment - 02/08/16 0942    Subjective No changes since last visit; did a few of my exercises at home, back feels better   Patient Stated Goals Pt's goal for  therapy is to be able to get around without falling.   Currently in Pain? No/denies                         [facility] Adult PT Treatment/Exercise - 02/08/16 0944    Ambulation/Gait   Ambulation/Gait Yes   Ambulation/Gait Assistance 5: Supervision   Ambulation/Gait Assistance Details 440 ft 3 min walk test no device, then 435 with cane in 3 minute walk   Ambulation Distance (Feet) 500 Feet  then 550 ft with cane   Assistive device Straight cane   Gait Pattern Step-through pattern;Decreased arm swing - right;Decreased arm swing - left;Decreased step length - left;Decreased trunk rotation;Narrow base of support;Poor foot clearance - left   Ambulation Surface Level;Indoor   Gait Comments With cane, noted improved L foot clearance.  Discussed again use of cane for improved gait pattern, improved safety with gait.   High Level Balance   High Level Balance Activities Side stepping;Backward walking;Marching forwards;Marching backwards  Forward/back walking 4 reps length of counter UE support   High Level Balance Comments At counter, pt performs HEP from last visit-return demo understanding.  Alternating step taps at 6" step then 12" step, 10 reps each with UE support  Lumbar Exercises: Aerobic   Stationary Bike Seated NuStep, Level 3, 4 extremities x 8 minutes with steps/minute >60                  PT Short Term Goals - 01/16/16 1948    PT SHORT TERM GOAL #1   Title Pt will perform HEP with family's supervision for improved functional mobility, balance, gait.  TARGET 02/13/16   Baseline 4   Period Weeks   Status New   PT SHORT TERM GOAL #2   Title Pt will improve 5x sit<>stand test to less than or equal to 25 seconds for improved efficiency and safety with transfers.   Time 4   Period Weeks   Status New   PT SHORT TERM GOAL #3   Title Pt will improve TUG score to less than or equal to 15 seconds for decreased fall risk.   Time 4   Period Weeks   Status New   PT  SHORT TERM GOAL #4   Title Pt will improve 4-square step test to less than or equal to 20 seconds for improved balance, transition stepping.   Time 4   Period Weeks   Status New   PT SHORT TERM GOAL #5   Title Pt will improve 3 minute walk test by at least 50 ft for improved gait efficiency.   Time 4   Period Weeks   Status New           PT Long Term Goals - 01/16/16 1956    PT LONG TERM GOAL #1   Title Pt/family will verbalize understandng of fall prevention within the home environment.  TARGET 03/17/16   Time 8   Period Weeks   Status New   PT LONG TERM GOAL #2   Title Pt will improve 5x sit<>stand to less than or equal to 20 seconds for improved efficiency and safety with transfers.   Time 8   Period Weeks   Status New   PT LONG TERM GOAL #3   Title Pt will improve gait velocity to at least 2.2 ft/sec for improved gait efficiency and safety.   Time 8   Period Weeks   Status New   PT LONG TERM GOAL #4   Title Pt will ambulate at least 1000 ft indoor and outdoor surfaces, with supervision, device as needed, for improved community gait.   Time 8   Period Weeks   Status New   PT LONG TERM GOAL #5   Title Pt/family will verbalize understanding of local Parkinson's disease resources.   Time 8   Period Weeks   Status New               Plan - 02/08/16 1406    Clinical Impression Statement Pt appears to have performed HEP at home this week and reports overall decreased pain in low back.  When walking with cane vs no cane, pt does not have noted change in gait distance in 3 minute walk test, but he is noted to have overall improved L foot clearance with LLE.  Pt feels therapy is benefiting him and he would like to continue beyond next week.     Pt will benefit from skilled therapeutic intervention in order to improve on the following deficits Abnormal gait;Decreased activity tolerance;Decreased balance;Decreased mobility;Decreased strength;Difficulty walking;Impaired  flexibility;Postural dysfunction;Pain   Rehab Potential Good   PT Frequency 2x / week   PT Duration 8 weeks  plus eval   PT Treatment/Interventions ADLs/Self  Care Home Management;Therapeutic exercise;Therapeutic activities;Functional mobility training;Gait training;Balance training;Neuromuscular re-education;Patient/family education   PT Next Visit Plan Discuss fall prevention/cueing strategies with patient/wife; discuss community fitness; Check STGs and plan to continue towards LTGs   Consulted and Agree with Plan of Care Patient   Family Member Consulted wife        Problem List Patient Active Problem List   Diagnosis Date Noted  . Greater trochanteric bursitis of right hip 07/15/2014  . Bursitis of hip 07/15/2014  . Pulmonary embolism ([place]) 10/01/2013    Class: Acute  . Thrombocytopenia ([place]) 10/01/2013    Class: Acute  . Hypertension 10/01/2013    Class: Chronic  . Abdominal aneurysm without mention of rupture 01/15/2012    Cheryll Keisler W. 02/08/2016, 2:09 PM Frazier Butt., PT  [facility] [address] [place], [place], [address] Phone: [phone]   Fax:  [phone]  Name: Christian Wiley MRN: [id number] Date of Birth: Nov 06, 1935

## 2016-02-13 ENCOUNTER — Ambulatory Visit: Payer: PPO | Admitting: Physical Therapy

## 2016-02-13 DIAGNOSIS — R293 Abnormal posture: Secondary | ICD-10-CM

## 2016-02-13 DIAGNOSIS — R258 Other abnormal involuntary movements: Secondary | ICD-10-CM

## 2016-02-13 DIAGNOSIS — R269 Unspecified abnormalities of gait and mobility: Secondary | ICD-10-CM

## 2016-02-13 NOTE — Therapy (Signed)
[facility] [address] [place], [place], [address] Phone: [phone]   Fax:  [phone]  Physical Therapy Treatment  Patient Details  Name: Christian Wiley MRN: [id number] Date of Birth: [birth year] Referring Provider: Alonza Bogus, DO  Encounter Date: 02/13/2016      PT End of Session - 02/13/16 1134    Visit Number 8   Number of Visits 17   Date for PT Re-Evaluation 03/17/16   Authorization Type G-code every 10th visit   PT Start Time 0934   PT Stop Time 1013   PT Time Calculation (min) 39 min   Activity Tolerance Patient tolerated treatment well   Behavior During Therapy [facility] for tasks assessed/performed      Past Medical History  Diagnosis Date  . Arthritis   . AAA (abdominal aortic aneurysm) ([place])   . Hx of hiatal hernia   . GERD (gastroesophageal reflux disease)   . Hypertension   . Hyperlipidemia   . Sigmoid diverticulosis   . Pulmonary embolism ([place]) 2011 and 2014   . Nocturia   . Cancer (HCC)     hx of skin cancer   . PE (pulmonary embolism)     Past Surgical History  Procedure Laterality Date  . Abdominal aortic aneurysm repair  2001    endovascular repair  . Cholecystectomy    . Joint replacement      bilateral knee replacement  . Rotator cuff repair      bilateral  . Back surgery      laminectomy  . Back surgery    . Excision/release bursa hip Right 07/15/2014    Procedure: RIGHT HIP BURSECTOMY WITH TENDON REPAIR ;  Surgeon: Gearlean Alf, MD;  [facility]: [facility];  Service: Orthopedics;  Laterality: Right;  . Cataract extraction, bilateral      There were no vitals filed for this visit.  Visit Diagnosis:  Postural instability  Bradykinesia  Abnormality of gait      Subjective Assessment - 02/13/16 0939    Subjective No changes since last visit; tired this morning   Patient Stated Goals Pt's goal for therapy is to be able to get around without falling.   Currently in Pain?  No/denies            [facility] PT Assessment - 02/13/16 0942    Four Square Step Test    Trial One  18.82   Trial Two 16.32                     [facility] Adult PT Treatment/Exercise - 02/13/16 0942    Transfers   Transfers Sit to Stand;Stand to Sit   Sit to Stand 5: Supervision;Without upper extremity assist;From chair/3-in-1   Sit to Stand Details (indicate cue type and reason) Cues provided for scoot out to edge of chair, initial forward lean   Five time sit to stand comments  16.31  2nd trial; (1st trial:  14.47 sec)   Stand to Sit 5: Supervision;Without upper extremity assist;To chair/3-in-1   Ambulation/Gait   Ambulation/Gait Yes   Ambulation/Gait Assistance 5: Supervision   Ambulation/Gait Assistance Details 467 ft in 3 minute walk test  several episodes of L foot catching, cues for foot clearance   Ambulation Distance (Feet) 500 Feet  230 ft with cane, cues for incr. step length   Assistive device Straight cane;None   Gait Pattern Step-through pattern;Decreased arm swing - right;Decreased arm swing - left;Decreased step length - left;Decreased trunk  rotation;Narrow base of support;Poor foot clearance - left   Ambulation Surface Level;Indoor   Gait Comments Discussed with wife present benefits of using cane with gait, for slowed gait pattern, improved step length, and potential for improved efficiency with gait.  Wife to bring cane next visit.   Standardized Balance Assessment   Standardized Balance Assessment Timed Up and Go Test   Timed Up and Go Test   TUG Normal TUG   Normal TUG (seconds) 13   Self-Care   Self-Care Other Self-Care Comments   Other Self-Care Comments  Discussed results of objective measures for short term goals; discussed plan of care, incorporating pt's goals into therapy for next 4 weeks:  Pt/wife report improvement in transfers at dinner table, pt/wife report trying to build up endurance/safety with walking so that pt is able to walk longer  (than 3 minutes)  without hip pain/discomfort.   Lumbar Exercises: Aerobic   Stationary Bike Seated SciFit Stepper, Level 1.5 4 extremities x 8 minutes, with cues for RPM >60                   PT Short Term Goals - 02/13/16 1134    PT SHORT TERM GOAL #1   Title Pt will perform HEP with family's supervision for improved functional mobility, balance, gait.  TARGET 02/13/16   Baseline 4   Period Weeks   Status Achieved   PT SHORT TERM GOAL #2   Title Pt will improve 5x sit<>stand test to less than or equal to 25 seconds for improved efficiency and safety with transfers.   Baseline 16.31 sec 02/13/16   Time 4   Period Weeks   Status Achieved   PT SHORT TERM GOAL #3   Title Pt will improve TUG score to less than or equal to 15 seconds for decreased fall risk.   Baseline 13 sec 02/13/16   Time 4   Period Weeks   Status Achieved   PT SHORT TERM GOAL #4   Title Pt will improve 4-square step test to less than or equal to 20 seconds for improved balance, transition stepping.   Baseline 16.32 sec with min guard assistance   Time 4   Period Weeks   Status Partially Met   PT SHORT TERM GOAL #5   Title Pt will improve 3 minute walk test by at least 50 ft for improved gait efficiency.   Baseline 440>467 ft in 3 minute walk   Time 4   Period Weeks   Status Not Met           PT Long Term Goals - 01/16/16 1956    PT LONG TERM GOAL #1   Title Pt/family will verbalize understandng of fall prevention within the home environment.  TARGET 03/17/16   Time 8   Period Weeks   Status New   PT LONG TERM GOAL #2   Title Pt will improve 5x sit<>stand to less than or equal to 20 seconds for improved efficiency and safety with transfers.   Time 8   Period Weeks   Status New   PT LONG TERM GOAL #3   Title Pt will improve gait velocity to at least 2.2 ft/sec for improved gait efficiency and safety.   Time 8   Period Weeks   Status New   PT LONG TERM GOAL #4   Title Pt will ambulate at  least 1000 ft indoor and outdoor surfaces, with supervision, device as needed, for improved community gait.  Time 8   Period Weeks   Status New   PT LONG TERM GOAL #5   Title Pt/family will verbalize understanding of local Parkinson's disease resources.   Time 8   Period Weeks   Status New               Plan - 02/13/16 1135    Clinical Impression Statement Pt has met STG #1, 2, 3; STG #4 partially met, requiring min guard assistance for 4-square step test; STG #5 not met.  Pt is making some improvements on functional/objective testing.  He still has difficulty with carryover with large step length and foot clearance for longer bouts of gait and gait limited by c/o fatigue and soreness through hip/low back area.  Pt will continue to benefit from further skilled PT to address balance, gait and functional strengthening.   Pt will benefit from skilled therapeutic intervention in order to improve on the following deficits Abnormal gait;Decreased activity tolerance;Decreased balance;Decreased mobility;Decreased strength;Difficulty walking;Impaired flexibility;Postural dysfunction;Pain   Rehab Potential Good   PT Frequency 2x / week   PT Duration 8 weeks  plus eval   PT Treatment/Interventions ADLs/Self Care Home Management;Therapeutic exercise;Therapeutic activities;Functional mobility training;Gait training;Balance training;Neuromuscular re-education;Patient/family education   PT Next Visit Plan Discuss fall prevention/cueing strategies with patient/wife; discuss walking program; work on standing exercises to improve foot clearance, step length   Consulted and Agree with Plan of Care Patient;Family member/caregiver   Family Member Consulted wife        Problem List Patient Active Problem List   Diagnosis Date Noted  . Greater trochanteric bursitis of right hip 07/15/2014  . Bursitis of hip 07/15/2014  . Pulmonary embolism ([place]) 10/01/2013    Class: Acute  . Thrombocytopenia  ([place]) 10/01/2013    Class: Acute  . Hypertension 10/01/2013    Class: Chronic  . Abdominal aneurysm without mention of rupture 01/15/2012    Adalaya Irion W. 02/13/2016, 11:39 AM  Frazier Butt., PT  [facility] [address] [place], [place], [address] Phone: [phone]   Fax:  [phone]  Name: Christian Wiley MRN: [id number] Date of Birth: Sep 23, 1935

## 2016-02-16 ENCOUNTER — Ambulatory Visit: Payer: PPO | Admitting: Physical Therapy

## 2016-02-16 DIAGNOSIS — R293 Abnormal posture: Secondary | ICD-10-CM | POA: Diagnosis not present

## 2016-02-16 DIAGNOSIS — R258 Other abnormal involuntary movements: Secondary | ICD-10-CM

## 2016-02-16 DIAGNOSIS — R269 Unspecified abnormalities of gait and mobility: Secondary | ICD-10-CM

## 2016-02-16 NOTE — Therapy (Signed)
[name] [address] [place], [place], [address] Phone: [phone]   Fax:  [phone]  Physical Therapy Treatment  Patient Details  Name: Christian Wiley MRN: [id number] Date of Birth: 09/29/1935 Referring Provider: Alonza Bogus, DO  Encounter Date: 02/16/2016      PT End of Session - 02/16/16 2037    Visit Number 9   Number of Visits 17   Date for PT Re-Evaluation 03/17/16   Authorization Type G-code every 10th visit   PT Start Time 0854  Pt arrives late   PT Stop Time 0932   PT Time Calculation (min) 38 min   Activity Tolerance Patient tolerated treatment well   Behavior During Therapy [facility] for tasks assessed/performed      Past Medical History  Diagnosis Date  . Arthritis   . AAA (abdominal aortic aneurysm) ([place])   . Hx of hiatal hernia   . GERD (gastroesophageal reflux disease)   . Hypertension   . Hyperlipidemia   . Sigmoid diverticulosis   . Pulmonary embolism ([place]) 2011 and 2014   . Nocturia   . Cancer (HCC)     hx of skin cancer   . PE (pulmonary embolism)     Past Surgical History  Procedure Laterality Date  . Abdominal aortic aneurysm repair  2001    endovascular repair  . Cholecystectomy    . Joint replacement      bilateral knee replacement  . Rotator cuff repair      bilateral  . Back surgery      laminectomy  . Back surgery    . Excision/release bursa hip Right 07/15/2014    Procedure: RIGHT HIP BURSECTOMY WITH TENDON REPAIR ;  Surgeon: Gearlean Alf, MD;  Location: [facility];  Service: Orthopedics;  Laterality: Right;  . Cataract extraction, bilateral      There were no vitals filed for this visit.  Visit Diagnosis:  Bradykinesia  Abnormality of gait      Subjective Assessment - 02/16/16 0858    Subjective No changes since last visit; pt arrives late-"almost slept in and didn't come"   Patient Stated Goals Pt's goal for therapy is to be able to get around without  falling.   Currently in Pain? No/denies                         [facility] Adult PT Treatment/Exercise - 02/16/16 0859    Ambulation/Gait   Ambulation/Gait Yes   Ambulation/Gait Assistance 5: Supervision   Ambulation Distance (Feet) 315 Feet  then 400 ft   Assistive device Straight cane   Gait Pattern Step-through pattern;Decreased arm swing - right;Decreased arm swing - left;Decreased step length - left;Decreased trunk rotation;Narrow base of support;Poor foot clearance - left   Ambulation Surface Level;Indoor   Gait Comments Occasional cues for L foot kicking for improved foot clearance with gait   High Level Balance   High Level Balance Activities Side stepping;Backward walking  forward/back walk 3-4 lengths of counter   High Level Balance Comments At counter:  Marching in place x 10 reps, then heel/toe raises x 2 sets, stagger stance position forward and back rock to improve active/dynamic stretching of gastrocs 2 sets x 10 reps, side step and weightshift x 10 reps with UE support, forward/back step and weightshift without stopping in middle to improve step length, x 10 reps each leg with UE support and min guard assistance.  Cues for larger amplitude step  length                 PT Education - 02/16/16 2036    Education provided Yes   Education Details Provided patient with and explained progression for walking program, starting with 3-4 minutes per day.  See instructions   Person(s) Educated Patient;Caregiver(s)   Methods Explanation;Demonstration;Handout   Comprehension Verbalized understanding;Returned demonstration;Need further instruction          PT Short Term Goals - 02/13/16 1134    PT SHORT TERM GOAL #1   Title Pt will perform HEP with family's supervision for improved functional mobility, balance, gait.  TARGET 02/13/16   Baseline 4   Period Weeks   Status Achieved   PT SHORT TERM GOAL #2   Title Pt will improve 5x sit<>stand test to less than  or equal to 25 seconds for improved efficiency and safety with transfers.   Baseline 16.31 sec 02/13/16   Time 4   Period Weeks   Status Achieved   PT SHORT TERM GOAL #3   Title Pt will improve TUG score to less than or equal to 15 seconds for decreased fall risk.   Baseline 13 sec 02/13/16   Time 4   Period Weeks   Status Achieved   PT SHORT TERM GOAL #4   Title Pt will improve 4-square step test to less than or equal to 20 seconds for improved balance, transition stepping.   Baseline 16.32 sec with min guard assistance   Time 4   Period Weeks   Status Partially Met   PT SHORT TERM GOAL #5   Title Pt will improve 3 minute walk test by at least 50 ft for improved gait efficiency.   Baseline 440>467 ft in 3 minute walk   Time 4   Period Weeks   Status Not Met           PT Long Term Goals - 01/16/16 1956    PT LONG TERM GOAL #1   Title Pt/family will verbalize understandng of fall prevention within the home environment.  TARGET 03/17/16   Time 8   Period Weeks   Status New   PT LONG TERM GOAL #2   Title Pt will improve 5x sit<>stand to less than or equal to 20 seconds for improved efficiency and safety with transfers.   Time 8   Period Weeks   Status New   PT LONG TERM GOAL #3   Title Pt will improve gait velocity to at least 2.2 ft/sec for improved gait efficiency and safety.   Time 8   Period Weeks   Status New   PT LONG TERM GOAL #4   Title Pt will ambulate at least 1000 ft indoor and outdoor surfaces, with supervision, device as needed, for improved community gait.   Time 8   Period Weeks   Status New   PT LONG TERM GOAL #5   Title Pt/family will verbalize understanding of local Parkinson's disease resources.   Time 8   Period Weeks   Status New               Plan - 02/16/16 2038    Clinical Impression Statement Pt appears to demonstrate improved step length and foot clearance with gait using cane today.  No complaints of hip pain with gait.  Pt will  continue to benefit from further skilled therapy to address balance, gait and functional strengthening.   Pt will benefit from skilled therapeutic intervention in order to  improve on the following deficits Abnormal gait;Decreased activity tolerance;Decreased balance;Decreased mobility;Decreased strength;Difficulty walking;Impaired flexibility;Postural dysfunction;Pain   Rehab Potential Good   PT Frequency 2x / week   PT Duration 8 weeks  plus eval   PT Treatment/Interventions ADLs/Self Care Home Management;Therapeutic exercise;Therapeutic activities;Functional mobility training;Gait training;Balance training;Neuromuscular re-education;Patient/family education   PT Next Visit Plan Discuss fall prevention/cueing strategies with patient/wife; review walking program; work on standing exercises to improve foot clearance, step length; GCODE next visit   Consulted and Agree with Plan of Care Patient;Family member/caregiver   Family Member Consulted wife        Problem List Patient Active Problem List   Diagnosis Date Noted  . Greater trochanteric bursitis of right hip 07/15/2014  . Bursitis of hip 07/15/2014  . Pulmonary embolism ([place]) 10/01/2013    Class: Acute  . Thrombocytopenia ([place]) 10/01/2013    Class: Acute  . Hypertension 10/01/2013    Class: Chronic  . Abdominal aneurysm without mention of rupture 01/15/2012    Kaliegh Willadsen W. 02/16/2016, 8:40 PM Frazier Butt., PT [facility] [address] [place], [place], [address] Phone: [phone]   Fax:  [phone]  Name: Christian Wiley MRN: [id number] Date of Birth: 02-13-1935

## 2016-02-16 NOTE — Patient Instructions (Signed)
WALKING  Walking is a great form of exercise to increase your strength, endurance and overall fitness.  A walking program can help you start slowly and gradually build endurance as you go.  Everyone's ability is different, so each person's starting point will be different.  You do not have to follow them exactly.  The are just samples. You should simply find out what's right for you and stick to that program.   In the beginning, you'll start off walking 2-3 times a day for short distances.  As you get stronger, you'll be walking further at just 1-2 times per day.  A. You Can Walk For A Certain Length Of Time Each Day    Walk 3-4 minutes 3 times per day.  Increase 1-2 minutes every 2 days (3 times per day).  Work up to 15-20 minutes (1-2 times per day).   Example:   Day 1-2 3 minutes 3 times per day   Day 7-8  8 minutes 2-3 times per day   Day 13-14 15 minutes 1-2 times per day  B. You Can Walk For a Certain Distance Each Day     Distance can be substituted for time.    Example:   3 trips to mailbox (at road)   3 trips to corner of block   3 trips around the block  C. Go to local [employer] and use the track.

## 2016-02-19 DIAGNOSIS — E668 Other obesity: Secondary | ICD-10-CM | POA: Diagnosis not present

## 2016-02-19 DIAGNOSIS — G629 Polyneuropathy, unspecified: Secondary | ICD-10-CM | POA: Diagnosis not present

## 2016-02-19 DIAGNOSIS — R001 Bradycardia, unspecified: Secondary | ICD-10-CM | POA: Diagnosis not present

## 2016-02-19 DIAGNOSIS — K5909 Other constipation: Secondary | ICD-10-CM | POA: Diagnosis not present

## 2016-02-19 DIAGNOSIS — I2699 Other pulmonary embolism without acute cor pulmonale: Secondary | ICD-10-CM | POA: Diagnosis not present

## 2016-02-19 DIAGNOSIS — F1320 Sedative, hypnotic or anxiolytic dependence, uncomplicated: Secondary | ICD-10-CM | POA: Diagnosis not present

## 2016-02-19 DIAGNOSIS — R7302 Impaired glucose tolerance (oral): Secondary | ICD-10-CM | POA: Diagnosis not present

## 2016-02-19 DIAGNOSIS — M4726 Other spondylosis with radiculopathy, lumbar region: Secondary | ICD-10-CM | POA: Diagnosis not present

## 2016-02-19 DIAGNOSIS — Z1389 Encounter for screening for other disorder: Secondary | ICD-10-CM | POA: Diagnosis not present

## 2016-02-19 DIAGNOSIS — E038 Other specified hypothyroidism: Secondary | ICD-10-CM | POA: Diagnosis not present

## 2016-02-19 DIAGNOSIS — Z6831 Body mass index (BMI) 31.0-31.9, adult: Secondary | ICD-10-CM | POA: Diagnosis not present

## 2016-02-19 DIAGNOSIS — G20 Parkinson's disease: Secondary | ICD-10-CM | POA: Diagnosis not present

## 2016-02-21 ENCOUNTER — Ambulatory Visit: Payer: PPO | Admitting: Physical Therapy

## 2016-02-21 DIAGNOSIS — R293 Abnormal posture: Secondary | ICD-10-CM | POA: Diagnosis not present

## 2016-02-21 DIAGNOSIS — R269 Unspecified abnormalities of gait and mobility: Secondary | ICD-10-CM

## 2016-02-21 NOTE — Therapy (Signed)
[facility] [address] [place], [place], [address] Phone: [phone]   Fax:  [phone]  Physical Therapy Treatment  Patient Details  Name: Christian Wiley MRN: [id number] Date of Birth: [birth year] Referring Provider: Alonza Bogus, DO  Encounter Date: 02/21/2016      PT End of Session - 02/21/16 0938    Visit Number 10   Number of Visits 17   Date for PT Re-Evaluation 03/17/16   Authorization Type G-code every 10th visit   PT Start Time 0850   PT Stop Time 0935   PT Time Calculation (min) 45 min   Activity Tolerance Patient tolerated treatment well   Behavior During Therapy [facility] for tasks assessed/performed      Past Medical History  Diagnosis Date  . Arthritis   . AAA (abdominal aortic aneurysm) ([place])   . Hx of hiatal hernia   . GERD (gastroesophageal reflux disease)   . Hypertension   . Hyperlipidemia   . Sigmoid diverticulosis   . Pulmonary embolism ([place]) 2011 and 2014   . Nocturia   . Cancer (HCC)     hx of skin cancer   . PE (pulmonary embolism)     Past Surgical History  Procedure Laterality Date  . Abdominal aortic aneurysm repair  2001    endovascular repair  . Cholecystectomy    . Joint replacement      bilateral knee replacement  . Rotator cuff repair      bilateral  . Back surgery      laminectomy  . Back surgery    . Excision/release bursa hip Right 07/15/2014    Procedure: RIGHT HIP BURSECTOMY WITH TENDON REPAIR ;  Surgeon: Gearlean Alf, MD;  Location: [facility];  Service: Orthopedics;  Laterality: Right;  . Cataract extraction, bilateral      There were no vitals filed for this visit.  Visit Diagnosis:  Abnormality of gait      Subjective Assessment - 02/21/16 0857    Subjective Denies falls or changes since last visit.  Wife still not feeling well.   Pertinent History History of back issues   Limitations Walking   Patient Stated Goals Pt's goal for therapy is to be able  to get around without falling.   Currently in Pain? No/denies           [facility] Adult PT Treatment/Exercise - 02/21/16 0927    Transfers   Transfers Sit to Stand;Stand to Sit   Sit to Stand 5: Supervision;Without upper extremity assist;From chair/3-in-1   Stand to Sit 5: Supervision;Without upper extremity assist;To chair/3-in-1   Number of Reps Other reps (comment)  10 from 20', 10 from 49'   Ambulation/Gait   Ambulation/Gait Yes   Ambulation/Gait Assistance 5: Supervision   Ambulation Distance (Feet) 120 Feet  four times and 440 x 1   Assistive device None   Gait Pattern Step-through pattern;Decreased arm swing - right;Decreased arm swing - left;Decreased step length - left;Decreased trunk rotation;Narrow base of support;Poor foot clearance - left   Ambulation Surface Level;Indoor   Pre-Gait Activities forward, backward and forward/backward stepping at counter with mod cues for attention to task and weight shifting   Gait Comments cues for L heel strike and bil arm swing;decreased cadence   Self-Care   Self-Care Other Self-Care Comments   Other Self-Care Comments  Reviewed fall prevention strategies and provided hand out   Lumbar Exercises: Aerobic   Stationary Bike Scifit level 2.0 all 4 extremities x  8 minutes            PT Education - 02/21/16 0937    Education provided Yes   Education Details Fall prevention strategies, reviewed recommendation for walking program   Person(s) Educated Patient   Methods Explanation;Demonstration;Handout   Comprehension Verbalized understanding;Need further instruction          PT Short Term Goals - 02/13/16 1134    PT SHORT TERM GOAL #1   Title Pt will perform HEP with family's supervision for improved functional mobility, balance, gait.  TARGET 02/13/16   Baseline 4   Period Weeks   Status Achieved   PT SHORT TERM GOAL #2   Title Pt will improve 5x sit<>stand test to less than or equal to 25 seconds for improved efficiency and  safety with transfers.   Baseline 16.31 sec 02/13/16   Time 4   Period Weeks   Status Achieved   PT SHORT TERM GOAL #3   Title Pt will improve TUG score to less than or equal to 15 seconds for decreased fall risk.   Baseline 13 sec 02/13/16   Time 4   Period Weeks   Status Achieved   PT SHORT TERM GOAL #4   Title Pt will improve 4-square step test to less than or equal to 20 seconds for improved balance, transition stepping.   Baseline 16.32 sec with min guard assistance   Time 4   Period Weeks   Status Partially Met   PT SHORT TERM GOAL #5   Title Pt will improve 3 minute walk test by at least 50 ft for improved gait efficiency.   Baseline 440>467 ft in 3 minute walk   Time 4   Period Weeks   Status Not Met           PT Long Term Goals - 01/16/16 1956    PT LONG TERM GOAL #1   Title Pt/family will verbalize understandng of fall prevention within the home environment.  TARGET 03/17/16   Time 8   Period Weeks   Status New   PT LONG TERM GOAL #2   Title Pt will improve 5x sit<>stand to less than or equal to 20 seconds for improved efficiency and safety with transfers.   Time 8   Period Weeks   Status New   PT LONG TERM GOAL #3   Title Pt will improve gait velocity to at least 2.2 ft/sec for improved gait efficiency and safety.   Time 8   Period Weeks   Status New   PT LONG TERM GOAL #4   Title Pt will ambulate at least 1000 ft indoor and outdoor surfaces, with supervision, device as needed, for improved community gait.   Time 8   Period Weeks   Status New   PT LONG TERM GOAL #5   Title Pt/family will verbalize understanding of local Parkinson's disease resources.   Time 8   Period Weeks   Status New               Plan - 02/21/16 7414    Clinical Impression Statement Pt continues to need cues for L foot clearance and arm swing.  Needs frequent redirection to attend to task.  Continue PT per POC.   Pt will benefit from skilled therapeutic intervention in order  to improve on the following deficits Abnormal gait;Decreased activity tolerance;Decreased balance;Decreased mobility;Decreased strength;Difficulty walking;Impaired flexibility;Postural dysfunction;Pain   Rehab Potential Good   PT Frequency 2x / week   PT  Duration 8 weeks  plus eval   PT Treatment/Interventions ADLs/Self Care Home Management;Therapeutic exercise;Therapeutic activities;Functional mobility training;Gait training;Balance training;Neuromuscular re-education;Patient/family education   PT Next Visit Plan work on standing exercises to improve foot clearance, step length;   Consulted and Agree with Plan of Care Patient          G-Codes - Feb 27, 2016 0941    Functional Assessment Tool Used TUG 13 seconds, 5x sit<>stand 16.31 seconds, 4-square step 16.32 seconds      Problem List Patient Active Problem List   Diagnosis Date Noted  . Greater trochanteric bursitis of right hip 07/15/2014  . Bursitis of hip 07/15/2014  . Pulmonary embolism ([place]) 10/01/2013    Class: Acute  . Thrombocytopenia ([place]) 10/01/2013    Class: Acute  . Hypertension 10/01/2013    Class: Chronic  . Abdominal aneurysm without mention of rupture 01/15/2012    Narda Bonds 27-Feb-2016, 9:42 AM  [facility] [address] [place], [place], [address] Phone: [phone]   Fax:  [phone]  Name: Christian Wiley MRN: [id number] Date of Birth: 1935-01-21    Narda Bonds, [place] 2016/02/27 9:42 AM Phone: [phone] Fax: [phone]  Addendum: G Codes Functional Assessment Tool:  Functional Limitation: Mobility: Walking and Moving Around Current Status  [phone]): CJ Goal Status  [phone]): CJ  Physical Therapy Progress Note  Dates of Reporting Period: 01/16/16 to 27-Feb-2016  Objective Reports of Subjective Statement: Improved transfer ability  Objective Measurements: TUG 13 sec  (improved from 20.67 sec), 5x sit<>stand 16.31 sec (improved from 33.58 sec), 4 square step test 16.32 sec (improved from 26.77 sec)  Goal Update: Pt has met STG #1-3; STG #4 partially met for 4-square step test (needs min guard assistance), STG #5 not met for improved distance on 3 minute walk test.  Plan: Continue to address balance, gait, increased step length, gait safety  Reason Skilled Services are Required: Pt has made improvements in transfers and gait.  Pt continues to demonstrate bradykinesia with transfers and with gait pattern.  Mady Haagensen, PT 02/27/2016 10:28 PM Phone: [phone] Fax: [phone]

## 2016-02-21 NOTE — Patient Instructions (Signed)
Fall Prevention in the Home  Falls can cause injuries and can affect people from all age groups. There are many simple things that you can do to make your home safe and to help prevent falls. WHAT CAN I DO ON THE OUTSIDE OF MY HOME? 1. Regularly repair the edges of walkways and driveways and fix any cracks. 2. Remove high doorway thresholds. 3. Trim any shrubbery on the main path into your home. 4. Use bright outdoor lighting. 5. Clear walkways of debris and clutter, including tools and rocks. 6. Regularly check that handrails are securely fastened and in good repair. Both sides of any steps should have handrails. 7. Install guardrails along the edges of any raised decks or porches. 8. Have leaves, snow, and ice cleared regularly. 9. Use sand or salt on walkways during winter months. 10. In the garage, clean up any spills right away, including grease or oil spills. WHAT CAN I DO IN THE BATHROOM?  Use night lights.  Install grab bars by the toilet and in the tub and shower. Do not use towel bars as grab bars.  Use non-skid mats or decals on the floor of the tub or shower.  If you need to sit down while you are in the shower, use a plastic, non-slip stool.[place]  Keep the floor dry. Immediately clean up any water that spills on the floor.  Remove soap buildup in the tub or shower on a regular basis.  Attach bath mats securely with double-sided non-slip rug tape.  Remove throw rugs and other tripping hazards from the floor. WHAT CAN I DO IN THE BEDROOM?  Use night lights.  Make sure that a bedside light is easy to reach.  Do not use oversized bedding that drapes onto the floor.  Have a firm chair that has side arms to use for getting dressed.  Remove throw rugs and other tripping hazards from the floor. WHAT CAN I DO IN THE KITCHEN?   Clean up any spills right away.  Avoid walking on wet floors.  Place frequently used items in easy-to-reach places.  If you need to reach for  something above you, use a sturdy step stool that has a grab bar.  Keep electrical cables out of the way.  Do not use floor polish or wax that makes floors slippery. If you have to use wax, make sure that it is non-skid floor wax.  Remove throw rugs and other tripping hazards from the floor. WHAT CAN I DO IN THE STAIRWAYS?  Do not leave any items on the stairs.  Make sure that there are handrails on both sides of the stairs. Fix handrails that are broken or loose. Make sure that handrails are as long as the stairways.  Check any carpeting to make sure that it is firmly attached to the stairs. Fix any carpet that is loose or worn.  Avoid having throw rugs at the top or bottom of stairways, or secure the rugs with carpet tape to prevent them from moving.  Make sure that you have a light switch at the top of the stairs and the bottom of the stairs. If you do not have them, have them installed. WHAT ARE SOME OTHER FALL PREVENTION TIPS?  Wear closed-toe shoes that fit well and support your feet. Wear shoes that have rubber soles or low heels.  When you use a stepladder, make sure that it is completely opened and that the sides are firmly locked. Have someone hold the ladder while you  are using it. Do not climb a closed stepladder.  Add color or contrast paint or tape to grab bars and handrails in your home. Place contrasting color strips on the first and last steps.  Use mobility aids as needed, such as canes, walkers, scooters, and crutches.  Turn on lights if it is dark. Replace any light bulbs that burn out.  Set up furniture so that there are clear paths. Keep the furniture in the same spot.  Fix any uneven floor surfaces.  Choose a carpet design that does not hide the edge of steps of a stairway.  Be aware of any and all pets.  Review your medicines with your healthcare provider. Some medicines can cause dizziness or changes in blood pressure, which increase your risk of  falling. Talk with your health care provider about other ways that you can decrease your risk of falls. This may include working with a physical therapist or trainer to improve your strength, balance, and endurance.   This information is not intended to replace advice given to you by your health care provider. Make sure you discuss any questions you have with your health care provider.   Document Released: 11/15/2002 Document Revised: 04/11/2015 Document Reviewed: 12/30/2014 Elsevier Interactive Patient Education [employer].  It is important to avoid accidents which may result in broken bones.  Here are a few ideas on how to make your home safer so you will be less likely to trip or fall.  Use nonskid mats or non slip strips in your shower or tub, on your bathroom floor and around sinks.  If you know that you have spilled water, wipe it up! In the bathroom, it is important to have properly installed grab bars on the walls or on the edge of the tub.  Towel racks are NOT strong enough for you to hold onto or to pull on for support. Stairs and hallways should have enough light.  Add lamps or night lights if you need ore light. It is good to have handrails on both sides of the stairs if possible.  Always fix broken handrails right away. It is important to see the edges of steps.  Paint the edges of outdoor steps white so you can see them better.  Put colored tape on the edge of inside steps. Throw-rugs are dangerous because they can slide.  Removing the rugs is the best idea, but if they must stay, add adhesive carpet tape to prevent slipping. Do not keep things on stairs or in the halls.  Remove small furniture that blocks the halls as it may cause you to trip.  Keep telephone and electrical cords out of the way where you walk. Always were sturdy, rubber-soled shoes for good support.  Never wear just socks, especially on the stairs.  Socks may cause you to slip or fall.  Do not wear full-length  housecoats as you can easily trip on the bottom.  Place the things you use the most on the shelves that are the easiest to reach.  If you use a stepstool, make sure it is in good condition.  If you feel unsteady, DO NOT climb, ask for help. If a health professional advises you to use a cane or walker, do not be ashamed.  These items can keep you from falling and breaking your bones.

## 2016-02-23 ENCOUNTER — Ambulatory Visit: Payer: PPO | Admitting: Physical Therapy

## 2016-02-23 DIAGNOSIS — R293 Abnormal posture: Secondary | ICD-10-CM

## 2016-02-23 DIAGNOSIS — R269 Unspecified abnormalities of gait and mobility: Secondary | ICD-10-CM

## 2016-02-23 NOTE — Patient Instructions (Signed)
HIP: Hamstrings - Short Sitting   Rest leg on raised surface. Keep knee straight. Lift chest. Hold _30 seconds. _3__ reps per set, _1-2__ sets per day.  Copyright  VHI. All rights reserved.   

## 2016-02-23 NOTE — Therapy (Signed)
[facility] [address] [place], [place], [address] Phone: [phone]   Fax:  [phone]  Physical Therapy Treatment  Patient Details  Name: Christian Wiley MRN: [id number] Date of Birth: 1935/02/23 Referring Provider: Alonza Bogus, DO  Encounter Date: 02/23/2016      PT End of Session - 02/23/16 1159    Visit Number 11   Number of Visits 17   Date for PT Re-Evaluation 03/17/16   Authorization Type G-code every 10th visit   PT Start Time 1019   PT Stop Time 1057   PT Time Calculation (min) 38 min   Activity Tolerance Patient tolerated treatment well   Behavior During Therapy [facility] for tasks assessed/performed      Past Medical History  Diagnosis Date  . Arthritis   . AAA (abdominal aortic aneurysm) ([place])   . Hx of hiatal hernia   . GERD (gastroesophageal reflux disease)   . Hypertension   . Hyperlipidemia   . Sigmoid diverticulosis   . Pulmonary embolism ([place]) 2011 and 2014   . Nocturia   . Cancer (HCC)     hx of skin cancer   . PE (pulmonary embolism)     Past Surgical History  Procedure Laterality Date  . Abdominal aortic aneurysm repair  2001    endovascular repair  . Cholecystectomy    . Joint replacement      bilateral knee replacement  . Rotator cuff repair      bilateral  . Back surgery      laminectomy  . Back surgery    . Excision/release bursa hip Right 07/15/2014    Procedure: RIGHT HIP BURSECTOMY WITH TENDON REPAIR ;  Surgeon: Gearlean Alf, MD;  Location: [facility];  Service: Orthopedics;  Laterality: Right;  . Cataract extraction, bilateral      There were no vitals filed for this visit.  Visit Diagnosis:  Abnormality of gait  Postural instability      Subjective Assessment - 02/23/16 1022    Subjective No changes since last visit.   Pertinent History History of back issues   Limitations Walking   Patient Stated Goals Pt's goal for therapy is to be able to get around  without falling.   Currently in Pain? No/denies                         [facility] Adult PT Treatment/Exercise - 02/23/16 1026    Transfers   Transfers Sit to Stand;Stand to Sit   Sit to Stand 5: Supervision;Without upper extremity assist;From chair/3-in-1   Stand to Sit 5: Supervision;Without upper extremity assist;To chair/3-in-1   Number of Reps 10 reps;Other sets (comment)  10 reps each from 20", then 18" surface   Ambulation/Gait   Gait Comments Short distance bouts of gait in gym 120 ft, then 60 ft x 2, then 30 ft x 2, with no device, slowed pace with cues for imrpoved L foot clearance and L heelstrike.  Pt does well initially with foot clearance and step length, but reverts back to decr. foot clearance and decr. heelstrike with conversation during gait.   High Level Balance   High Level Balance Activities Side stepping;Backward walking;Marching forwards;Marching backwards  3-5 reps length of counter with 1UE support   High Level Balance Comments At counter-forward/back step and weigthshift x 12 reps each leg for improved foot clearance, step length with gait; stagger stance forward/back rocking at counter x 15 reps each side,  for dynamic stretch to gastroc for improved heelstrike with gait.  Single limb stance 3 x 10 sec each leg with bilat UE support   Lumbar Exercises: Stretches   Active Hamstring Stretch 3 reps;30 seconds  each leg, seated position   Lumbar Exercises: Aerobic   Stationary Bike SciFit, Level 2, 4 extremities, x 8 minutes, with cues for RPM >60-65 for improved intensity of exercise (for lower extremity flexibility and strengthening)                PT Education - 02/23/16 1159    Education Details Added hamstring stretch to HEP   Person(s) Educated Patient   Methods Explanation;Demonstration;Handout   Comprehension Verbalized understanding;Returned demonstration;Verbal cues required          PT Short Term Goals - 02/13/16 1134    PT  SHORT TERM GOAL #1   Title Pt will perform HEP with family's supervision for improved functional mobility, balance, gait.  TARGET 02/13/16   Baseline 4   Period Weeks   Status Achieved   PT SHORT TERM GOAL #2   Title Pt will improve 5x sit<>stand test to less than or equal to 25 seconds for improved efficiency and safety with transfers.   Baseline 16.31 sec 02/13/16   Time 4   Period Weeks   Status Achieved   PT SHORT TERM GOAL #3   Title Pt will improve TUG score to less than or equal to 15 seconds for decreased fall risk.   Baseline 13 sec 02/13/16   Time 4   Period Weeks   Status Achieved   PT SHORT TERM GOAL #4   Title Pt will improve 4-square step test to less than or equal to 20 seconds for improved balance, transition stepping.   Baseline 16.32 sec with min guard assistance   Time 4   Period Weeks   Status Partially Met   PT SHORT TERM GOAL #5   Title Pt will improve 3 minute walk test by at least 50 ft for improved gait efficiency.   Baseline 440>467 ft in 3 minute walk   Time 4   Period Weeks   Status Not Met           PT Long Term Goals - 01/16/16 1956    PT LONG TERM GOAL #1   Title Pt/family will verbalize understandng of fall prevention within the home environment.  TARGET 03/17/16   Time 8   Period Weeks   Status New   PT LONG TERM GOAL #2   Title Pt will improve 5x sit<>stand to less than or equal to 20 seconds for improved efficiency and safety with transfers.   Time 8   Period Weeks   Status New   PT LONG TERM GOAL #3   Title Pt will improve gait velocity to at least 2.2 ft/sec for improved gait efficiency and safety.   Time 8   Period Weeks   Status New   PT LONG TERM GOAL #4   Title Pt will ambulate at least 1000 ft indoor and outdoor surfaces, with supervision, device as needed, for improved community gait.   Time 8   Period Weeks   Status New   PT LONG TERM GOAL #5   Title Pt/family will verbalize understanding of local Parkinson's disease  resources.   Time 8   Period Weeks   Status New               Plan - 02/23/16 1159  Clinical Impression Statement Pt demonstrates improved foot clearance and heelstrike on L with cues, but with conversation tasks, pt reverts back to decr. foot clearance and decreased heelstrike.  Pt will continue to benefit from further skilled PT to address balance and gait.   Pt will benefit from skilled therapeutic intervention in order to improve on the following deficits Abnormal gait;Decreased activity tolerance;Decreased balance;Decreased mobility;Decreased strength;Difficulty walking;Impaired flexibility;Postural dysfunction;Pain   Rehab Potential Good   PT Frequency 2x / week   PT Duration 8 weeks  plus eval   PT Treatment/Interventions ADLs/Self Care Home Management;Therapeutic exercise;Therapeutic activities;Functional mobility training;Gait training;Balance training;Neuromuscular re-education;Patient/family education   PT Next Visit Plan work on standing exercises to improve foot clearance, step length functional strengthening, gait activities   Consulted and Agree with Plan of Care Patient        Problem List Patient Active Problem List   Diagnosis Date Noted  . Greater trochanteric bursitis of right hip 07/15/2014  . Bursitis of hip 07/15/2014  . Pulmonary embolism ([place]) 10/01/2013    Class: Acute  . Thrombocytopenia ([place]) 10/01/2013    Class: Acute  . Hypertension 10/01/2013    Class: Chronic  . Abdominal aneurysm without mention of rupture 01/15/2012    Zygmunt Mcglinn W. 02/23/2016, 12:01 PM  Frazier Butt., PT [place] [address] [place], [place], [address] Phone: [phone]   Fax:  [phone]  Name: Christian Wiley MRN: [id number] Date of Birth: 1935-05-10

## 2016-02-27 ENCOUNTER — Ambulatory Visit: Payer: PPO | Admitting: Physical Therapy

## 2016-02-27 DIAGNOSIS — R269 Unspecified abnormalities of gait and mobility: Secondary | ICD-10-CM

## 2016-02-27 DIAGNOSIS — R293 Abnormal posture: Secondary | ICD-10-CM | POA: Diagnosis not present

## 2016-02-27 DIAGNOSIS — R258 Other abnormal involuntary movements: Secondary | ICD-10-CM

## 2016-02-27 NOTE — Therapy (Signed)
[facility] [address] [place], [place], [address] Phone: [phone]   Fax:  [phone]  Physical Therapy Treatment  Patient Details  Name: Christian Wiley MRN: [id number] Date of Birth: [birth year] Referring Provider: Alonza Bogus, DO  Encounter Date: 02/27/2016      PT End of Session - 02/27/16 2210    Visit Number 12   Number of Visits 17   Date for PT Re-Evaluation 03/17/16   Authorization Type G-code every 10th visit   PT Start Time 0849   PT Stop Time 0929   PT Time Calculation (min) 40 min   Activity Tolerance Patient tolerated treatment well   Behavior During Therapy [facility] for tasks assessed/performed      Past Medical History  Diagnosis Date  . Arthritis   . AAA (abdominal aortic aneurysm) ([place])   . Hx of hiatal hernia   . GERD (gastroesophageal reflux disease)   . Hypertension   . Hyperlipidemia   . Sigmoid diverticulosis   . Pulmonary embolism ([place]) 2011 and 2014   . Nocturia   . Cancer (HCC)     hx of skin cancer   . PE (pulmonary embolism)     Past Surgical History  Procedure Laterality Date  . Abdominal aortic aneurysm repair  2001    endovascular repair  . Cholecystectomy    . Joint replacement      bilateral knee replacement  . Rotator cuff repair      bilateral  . Back surgery      laminectomy  . Back surgery    . Excision/release bursa hip Right 07/15/2014    Procedure: RIGHT HIP BURSECTOMY WITH TENDON REPAIR ;  Surgeon: Gearlean Alf, MD;  Location: [facility];  Service: Orthopedics;  Laterality: Right;  . Cataract extraction, bilateral      There were no vitals filed for this visit.  Visit Diagnosis:  Abnormality of gait  Bradykinesia      Subjective Assessment - 02/27/16 0852    Subjective No changes; did exercises about "half-way" over the weekend.  Did some walking over the weekend 15 minutes or so without cane.   Pertinent History History of back issues    Limitations Walking   Patient Stated Goals Pt's goal for therapy is to be able to get around without falling.   Currently in Pain? No/denies                         [facility] Adult PT Treatment/Exercise - 02/27/16 0855    Transfers   Transfers Sit to Stand;Stand to Sit   Sit to Stand 6: Modified independent (Device/Increase time);Without upper extremity assist;From chair/3-in-1;From bed   Stand to Sit 6: Modified independent (Device/Increase time);Without upper extremity assist;To bed;To chair/3-in-1   Number of Reps 10 reps;Other sets (comment)  from 20", then 18" surface   Ambulation/Gait   Ambulation/Gait Yes   Ambulation/Gait Assistance 6: Modified independent (Device/Increase time)   Ambulation/Gait Assistance Details With short distance gait in gym area, pt gets easily distracted, and PT notes several episodes of L foot catching floor.  Pt does not experience LOB.   Ambulation Distance (Feet) 120 Feet  then 100, 60 ft x 2   Assistive device None   Gait Pattern Step-through pattern;Decreased arm swing - right;Decreased arm swing - left;Decreased step length - left;Decreased trunk rotation;Narrow base of support;Poor foot clearance - left   Ambulation Surface Level;Indoor   High Level Balance  High Level Balance Activities Side stepping;Backward walking;Marching forwards;Marching backwards  3-5 reps at counter with 1 UE support   High Level Balance Comments At counter-forward/back step and weigthshift x 12 reps each leg for improved foot clearance, step length with gait; stagger stance forward/back rocking at counter x 15 reps each side, for dynamic stretch to gastroc for improved heelstrike with gait.  Cues for frequent redirection to task at counter ex today   Exercises   Exercises Knee/Hip   Lumbar Exercises: Stretches   Active Hamstring Stretch 3 reps;30 seconds  seated position; review of HEP   Lumbar Exercises: Aerobic   Stationary Bike SciFit, Level 2, 4  extremities, x 8 minutes, with cues for RPM >60-65 for improved intensity of exercise (for lower extremity flexibility and strengthening)   Knee/Hip Exercises: Standing   Forward Step Up Right;Left;10 reps;Step Height: 6";Hand Hold: 2  for functional lower extremity strengthening                  PT Short Term Goals - 02/13/16 1134    PT SHORT TERM GOAL #1   Title Pt will perform HEP with family's supervision for improved functional mobility, balance, gait.  TARGET 02/13/16   Baseline 4   Period Weeks   Status Achieved   PT SHORT TERM GOAL #2   Title Pt will improve 5x sit<>stand test to less than or equal to 25 seconds for improved efficiency and safety with transfers.   Baseline 16.31 sec 02/13/16   Time 4   Period Weeks   Status Achieved   PT SHORT TERM GOAL #3   Title Pt will improve TUG score to less than or equal to 15 seconds for decreased fall risk.   Baseline 13 sec 02/13/16   Time 4   Period Weeks   Status Achieved   PT SHORT TERM GOAL #4   Title Pt will improve 4-square step test to less than or equal to 20 seconds for improved balance, transition stepping.   Baseline 16.32 sec with min guard assistance   Time 4   Period Weeks   Status Partially Met   PT SHORT TERM GOAL #5   Title Pt will improve 3 minute walk test by at least 50 ft for improved gait efficiency.   Baseline 440>467 ft in 3 minute walk   Time 4   Period Weeks   Status Not Met           PT Long Term Goals - 01/16/16 1956    PT LONG TERM GOAL #1   Title Pt/family will verbalize understandng of fall prevention within the home environment.  TARGET 03/17/16   Time 8   Period Weeks   Status New   PT LONG TERM GOAL #2   Title Pt will improve 5x sit<>stand to less than or equal to 20 seconds for improved efficiency and safety with transfers.   Time 8   Period Weeks   Status New   PT LONG TERM GOAL #3   Title Pt will improve gait velocity to at least 2.2 ft/sec for improved gait efficiency and  safety.   Time 8   Period Weeks   Status New   PT LONG TERM GOAL #4   Title Pt will ambulate at least 1000 ft indoor and outdoor surfaces, with supervision, device as needed, for improved community gait.   Time 8   Period Weeks   Status New   PT LONG TERM GOAL #5   Title Pt/family  will verbalize understanding of local Parkinson's disease resources.   Time 8   Period Weeks   Status New               Plan - 02/27/16 2211    Clinical Impression Statement Pt needs frequent redirection to task today.  Able to tolerate standing exercises well; however, he has difficulty sequencing stepping and weigthshifting activities in posterior direction.  Pt will continue to benefit from further skilled PT to address balance, gait and functional strength.   Pt will benefit from skilled therapeutic intervention in order to improve on the following deficits Abnormal gait;Decreased activity tolerance;Decreased balance;Decreased mobility;Decreased strength;Difficulty walking;Impaired flexibility;Postural dysfunction;Pain   Rehab Potential Good   PT Frequency 2x / week   PT Duration 8 weeks  plus eval   PT Treatment/Interventions ADLs/Self Care Home Management;Therapeutic exercise;Therapeutic activities;Functional mobility training;Gait training;Balance training;Neuromuscular re-education;Patient/family education   PT Next Visit Plan Cotninue to work on standing exercises to improve foot clearance, step length functional strengthening, gait activities   Consulted and Agree with Plan of Care Patient        Problem List Patient Active Problem List   Diagnosis Date Noted  . Greater trochanteric bursitis of right hip 07/15/2014  . Bursitis of hip 07/15/2014  . Pulmonary embolism ([place]) 10/01/2013    Class: Acute  . Thrombocytopenia ([employer]) 10/01/2013    Class: Acute  . Hypertension 10/01/2013    Class: Chronic  . Abdominal aneurysm without mention of rupture 01/15/2012    Reesha Debes  W. 02/27/2016, 10:17 PM  Frazier Butt., PT  [facility] [address] [place], [place], [address] Phone: [phone]   Fax:  [phone]  Name: Christian Wiley MRN: [id number] Date of Birth: [birth year]

## 2016-02-27 NOTE — Patient Instructions (Signed)
HIP: Hamstrings - Short Sitting    Rest leg on raised surface. Keep knee straight. Lift chest. Hold __30_ seconds. _3__ reps per set, __1-2_ sets per day  Copyright  VHI. All rights reserved. (Re-issue from previous visit)

## 2016-03-01 ENCOUNTER — Ambulatory Visit: Payer: PPO | Admitting: Physical Therapy

## 2016-03-01 DIAGNOSIS — D1801 Hemangioma of skin and subcutaneous tissue: Secondary | ICD-10-CM | POA: Diagnosis not present

## 2016-03-01 DIAGNOSIS — R269 Unspecified abnormalities of gait and mobility: Secondary | ICD-10-CM

## 2016-03-01 DIAGNOSIS — R258 Other abnormal involuntary movements: Secondary | ICD-10-CM

## 2016-03-01 DIAGNOSIS — R293 Abnormal posture: Secondary | ICD-10-CM | POA: Diagnosis not present

## 2016-03-01 DIAGNOSIS — L820 Inflamed seborrheic keratosis: Secondary | ICD-10-CM | POA: Diagnosis not present

## 2016-03-01 DIAGNOSIS — L821 Other seborrheic keratosis: Secondary | ICD-10-CM | POA: Diagnosis not present

## 2016-03-01 DIAGNOSIS — D225 Melanocytic nevi of trunk: Secondary | ICD-10-CM | POA: Diagnosis not present

## 2016-03-01 NOTE — Therapy (Signed)
[facility] [address] [place], [place], [address] Phone: [phone]   Fax:  [phone]  Physical Therapy Treatment  Patient Details  Name: Christian Wiley MRN: [id number] Date of Birth: [birth year] Referring Provider: Alonza Bogus, DO  Encounter Date: 03/01/2016      PT End of Session - 03/01/16 1726    Visit Number 13   Number of Visits 17   Date for PT Re-Evaluation 03/17/16   Authorization Type G-code every 10th visit   PT Start Time 0935   PT Stop Time 1013   PT Time Calculation (min) 38 min   Equipment Utilized During Treatment Gait belt   Activity Tolerance Patient tolerated treatment well   Behavior During Therapy [facility] for tasks assessed/performed      Past Medical History  Diagnosis Date  . Arthritis   . AAA (abdominal aortic aneurysm) ([place])   . Hx of hiatal hernia   . GERD (gastroesophageal reflux disease)   . Hypertension   . Hyperlipidemia   . Sigmoid diverticulosis   . Pulmonary embolism ([place]) 2011 and 2014   . Nocturia   . Cancer (HCC)     hx of skin cancer   . PE (pulmonary embolism)     Past Surgical History  Procedure Laterality Date  . Abdominal aortic aneurysm repair  2001    endovascular repair  . Cholecystectomy    . Joint replacement      bilateral knee replacement  . Rotator cuff repair      bilateral  . Back surgery      laminectomy  . Back surgery    . Excision/release bursa hip Right 07/15/2014    Procedure: RIGHT HIP BURSECTOMY WITH TENDON REPAIR ;  Surgeon: Gearlean Alf, MD;  Location: [facility];  Service: Orthopedics;  Laterality: Right;  . Cataract extraction, bilateral      There were no vitals filed for this visit.  Visit Diagnosis:  Bradykinesia  Abnormality of gait      Subjective Assessment - 03/01/16 0937    Subjective No balance changes or falls since last visit-did mow the grass yesterday and it wiped me out pretty good.   Patient Stated Goals Pt's  goal for therapy is to be able to get around without falling.   Currently in Pain? No/denies                   Sit<>stand practice as noted below, for improved functional strengthening.      [facility] Adult PT Treatment/Exercise - 03/01/16 0938    Transfers   Transfers Sit to Stand;Stand to Sit   Sit to Stand 6: Modified independent (Device/Increase time);Without upper extremity assist;From chair/3-in-1;From bed   Stand to Sit 6: Modified independent (Device/Increase time);Without upper extremity assist;To bed;To chair/3-in-1   Number of Reps 10 reps;Other sets (comment)  from 24", then 18" surfaces   Ambulation/Gait   Ambulation/Gait Yes   Ambulation/Gait Assistance 6: Modified independent (Device/Increase time)   Ambulation/Gait Assistance Details Improved foot clearance with cues and with exaggerrated foot clearance   Ambulation Distance (Feet) 520 Feet  100 ft x 2   Assistive device None   Gait Pattern Step-through pattern;Decreased arm swing - right;Decreased arm swing - left;Decreased step length - left;Decreased trunk rotation;Narrow base of support;Poor foot clearance - left   Ambulation Surface Level;Indoor   High Level Balance   High Level Balance Activities Side stepping;Backward walking;Marching forwards;Marching backwards  3-5 reps each along counter with support  High Level Balance Comments At counter-forward/back step and weigthshift x 15 reps each leg for improved foot clearance, step length with gait; stagger stance forward/back rocking at counter x 15 reps each side, for dynamic stretch to gastroc for improved heelstrike with gait.   Exercises   Exercises Knee/Hip   Lumbar Exercises: Stretches   Active Hamstring Stretch 3 reps;30 seconds  seated position       Neuro Re-education: Continued- In parallel bars:  On rockerboard-forward/back weightshifting for hip/ankle strategy work; on compliant foam beam:  Marching in place, forward kicks, forward step  taps, x 10 reps each with intermittent UE support; forward step and weightshift over beam x 10 reps each leg, then side step and weightshift over beam x 10 reps each leg, for improved step length and foot clearance.  Ended session with alternating step taps to 6" and 12" steps with UE support x 10 reps each.            PT Short Term Goals - 02/13/16 1134    PT SHORT TERM GOAL #1   Title Pt will perform HEP with family's supervision for improved functional mobility, balance, gait.  TARGET 02/13/16   Baseline 4   Period Weeks   Status Achieved   PT SHORT TERM GOAL #2   Title Pt will improve 5x sit<>stand test to less than or equal to 25 seconds for improved efficiency and safety with transfers.   Baseline 16.31 sec 02/13/16   Time 4   Period Weeks   Status Achieved   PT SHORT TERM GOAL #3   Title Pt will improve TUG score to less than or equal to 15 seconds for decreased fall risk.   Baseline 13 sec 02/13/16   Time 4   Period Weeks   Status Achieved   PT SHORT TERM GOAL #4   Title Pt will improve 4-square step test to less than or equal to 20 seconds for improved balance, transition stepping.   Baseline 16.32 sec with min guard assistance   Time 4   Period Weeks   Status Partially Met   PT SHORT TERM GOAL #5   Title Pt will improve 3 minute walk test by at least 50 ft for improved gait efficiency.   Baseline 440>467 ft in 3 minute walk   Time 4   Period Weeks   Status Not Met           PT Long Term Goals - 01/16/16 1956    PT LONG TERM GOAL #1   Title Pt/family will verbalize understandng of fall prevention within the home environment.  TARGET 03/17/16   Time 8   Period Weeks   Status New   PT LONG TERM GOAL #2   Title Pt will improve 5x sit<>stand to less than or equal to 20 seconds for improved efficiency and safety with transfers.   Time 8   Period Weeks   Status New   PT LONG TERM GOAL #3   Title Pt will improve gait velocity to at least 2.2 ft/sec for improved  gait efficiency and safety.   Time 8   Period Weeks   Status New   PT LONG TERM GOAL #4   Title Pt will ambulate at least 1000 ft indoor and outdoor surfaces, with supervision, device as needed, for improved community gait.   Time 8   Period Weeks   Status New   PT LONG TERM GOAL #5   Title Pt/family will verbalize understanding of local  Parkinson's disease resources.   Time 8   Period Weeks   Status New               Plan - 03/01/16 1726    Clinical Impression Statement Pt appears to have improved foot clearance, improved focus to task today with balance and gait activities.  Pt will continue to benefit from further skilled PT to address balance, functional strength and gait.   Pt will benefit from skilled therapeutic intervention in order to improve on the following deficits Abnormal gait;Decreased activity tolerance;Decreased balance;Decreased mobility;Decreased strength;Difficulty walking;Impaired flexibility;Postural dysfunction;Pain   Rehab Potential Good   PT Frequency 2x / week   PT Duration 8 weeks  plus eval   PT Treatment/Interventions ADLs/Self Care Home Management;Therapeutic exercise;Therapeutic activities;Functional mobility training;Gait training;Balance training;Neuromuscular re-education;Patient/family education   PT Next Visit Plan Cotninue to work on standing exercises to improve foot clearance, step length functional strengthening, gait activities   Consulted and Agree with Plan of Care Patient        Problem List Patient Active Problem List   Diagnosis Date Noted  . Greater trochanteric bursitis of right hip 07/15/2014  . Bursitis of hip 07/15/2014  . Pulmonary embolism ([place]) 10/01/2013    Class: Acute  . Thrombocytopenia ([place]) 10/01/2013    Class: Acute  . Hypertension 10/01/2013    Class: Chronic  . Abdominal aneurysm without mention of rupture 01/15/2012    Omah Dewalt W. 03/01/2016, 5:28 PM Frazier Butt., PT [facility] [address] [place], [place], [address] Phone: [phone]   Fax:  [phone]  Name: GRECO GASTELUM MRN: [id number] Date of Birth: 12/09/1935

## 2016-03-05 ENCOUNTER — Ambulatory Visit: Payer: PPO | Admitting: Physical Therapy

## 2016-03-05 DIAGNOSIS — R258 Other abnormal involuntary movements: Secondary | ICD-10-CM

## 2016-03-05 DIAGNOSIS — R293 Abnormal posture: Secondary | ICD-10-CM | POA: Diagnosis not present

## 2016-03-05 DIAGNOSIS — R269 Unspecified abnormalities of gait and mobility: Secondary | ICD-10-CM

## 2016-03-05 DIAGNOSIS — M6281 Muscle weakness (generalized): Secondary | ICD-10-CM

## 2016-03-05 NOTE — Therapy (Signed)
[facility] [address] [place], [place], [address] Phone: [phone]   Fax:  [phone]  Physical Therapy Treatment  Patient Details  Name: Christian Wiley MRN: [id number] Date of Birth: October 05, 1935 Referring Provider: Alonza Bogus, DO  Encounter Date: 03/05/2016      PT End of Session - 03/05/16 1004    Visit Number 14   Number of Visits 17   Date for PT Re-Evaluation 03/17/16   Authorization Type G-code every 10th visit   PT Start Time 0940  pt arrived late   PT Stop Time 1011   PT Time Calculation (min) 31 min   Equipment Utilized During Treatment Gait belt   Activity Tolerance Patient tolerated treatment well   Behavior During Therapy [facility] for tasks assessed/performed      Past Medical History  Diagnosis Date  . Arthritis   . AAA (abdominal aortic aneurysm) ([place])   . Hx of hiatal hernia   . GERD (gastroesophageal reflux disease)   . Hypertension   . Hyperlipidemia   . Sigmoid diverticulosis   . Pulmonary embolism ([place]) 2011 and 2014   . Nocturia   . Cancer (HCC)     hx of skin cancer   . PE (pulmonary embolism)     Past Surgical History  Procedure Laterality Date  . Abdominal aortic aneurysm repair  2001    endovascular repair  . Cholecystectomy    . Joint replacement      bilateral knee replacement  . Rotator cuff repair      bilateral  . Back surgery      laminectomy  . Back surgery    . Excision/release bursa hip Right 07/15/2014    Procedure: RIGHT HIP BURSECTOMY WITH TENDON REPAIR ;  Surgeon: Gearlean Alf, MD;  Location: [facility];  Service: Orthopedics;  Laterality: Right;  . Cataract extraction, bilateral      There were no vitals filed for this visit.  Visit Diagnosis:  Bradykinesia  Abnormality of gait  Postural instability  Muscle weakness of lower extremity      Subjective Assessment - 03/05/16 0943    Subjective "rough" (when asked how it was going)   Pertinent  History History of back issues   Patient Stated Goals Pt's goal for therapy is to be able to get around without falling.   Currently in Pain? No/denies                         [facility] Adult PT Treatment/Exercise - 03/05/16 1004    Lumbar Exercises: Aerobic   Stationary Bike SciFit, Level 4, 4 extremities, x 8 minutes, with cues for RPM >60-65 for improved intensity of exercise (for lower extremity flexibility and strengthening)             Balance Exercises - 03/05/16 1003    Balance Exercises: Standing   Stepping Strategy Anterior;Posterior;Lateral;10 reps  intermittent UE support and mod cues for sequencing   Rockerboard Anterior/posterior;Lateral;30 seconds;5 reps;UE support  min cues for hip strategy   Balance Beam red beam: horizontal/vertical head turns x 10; alt taps x 20 with intermittent UE support             PT Short Term Goals - 02/13/16 1134    PT SHORT TERM GOAL #1   Title Pt will perform HEP with family's supervision for improved functional mobility, balance, gait.  TARGET 02/13/16   Baseline 4   Period Weeks  Status Achieved   PT SHORT TERM GOAL #2   Title Pt will improve 5x sit<>stand test to less than or equal to 25 seconds for improved efficiency and safety with transfers.   Baseline 16.31 sec 02/13/16   Time 4   Period Weeks   Status Achieved   PT SHORT TERM GOAL #3   Title Pt will improve TUG score to less than or equal to 15 seconds for decreased fall risk.   Baseline 13 sec 02/13/16   Time 4   Period Weeks   Status Achieved   PT SHORT TERM GOAL #4   Title Pt will improve 4-square step test to less than or equal to 20 seconds for improved balance, transition stepping.   Baseline 16.32 sec with min guard assistance   Time 4   Period Weeks   Status Partially Met   PT SHORT TERM GOAL #5   Title Pt will improve 3 minute walk test by at least 50 ft for improved gait efficiency.   Baseline 440>467 ft in 3 minute walk   Time 4    Period Weeks   Status Not Met           PT Long Term Goals - 01/16/16 1956    PT LONG TERM GOAL #1   Title Pt/family will verbalize understandng of fall prevention within the home environment.  TARGET 03/17/16   Time 8   Period Weeks   Status New   PT LONG TERM GOAL #2   Title Pt will improve 5x sit<>stand to less than or equal to 20 seconds for improved efficiency and safety with transfers.   Time 8   Period Weeks   Status New   PT LONG TERM GOAL #3   Title Pt will improve gait velocity to at least 2.2 ft/sec for improved gait efficiency and safety.   Time 8   Period Weeks   Status New   PT LONG TERM GOAL #4   Title Pt will ambulate at least 1000 ft indoor and outdoor surfaces, with supervision, device as needed, for improved community gait.   Time 8   Period Weeks   Status New   PT LONG TERM GOAL #5   Title Pt/family will verbalize understanding of local Parkinson's disease resources.   Time 8   Period Weeks   Status New               Plan - 03/05/16 1005    Clinical Impression Statement Pt easily distracted in clinic needing mod cues for attention, but tolerated balance exercises well today.  Will conitnue to benefit from PT to maximize function, balance, strength and gait.   PT Next Visit Plan Cotninue to work on standing exercises to improve foot clearance, step length functional strengthening, gait activities   Consulted and Agree with Plan of Care Patient        Problem List Patient Active Problem List   Diagnosis Date Noted  . Greater trochanteric bursitis of right hip 07/15/2014  . Bursitis of hip 07/15/2014  . Pulmonary embolism ([place]) 10/01/2013    Class: Acute  . Thrombocytopenia ([place]) 10/01/2013    Class: Acute  . Hypertension 10/01/2013    Class: Chronic  . Abdominal aneurysm without mention of rupture 01/15/2012   Laureen Abrahams, PT, DPT 03/05/2016 10:12 AM  [facility] [address] [place], [place], [address] Phone: [phone]   Fax:  [phone]  Name: Christian Wiley MRN:  [id number] Date of Birth: Mar 07, 1935

## 2016-03-07 ENCOUNTER — Ambulatory Visit: Payer: PPO | Admitting: Physical Therapy

## 2016-03-07 DIAGNOSIS — R269 Unspecified abnormalities of gait and mobility: Secondary | ICD-10-CM

## 2016-03-07 DIAGNOSIS — R293 Abnormal posture: Secondary | ICD-10-CM | POA: Diagnosis not present

## 2016-03-07 DIAGNOSIS — M6281 Muscle weakness (generalized): Secondary | ICD-10-CM

## 2016-03-07 NOTE — Therapy (Signed)
[facility] [address] [name], [place], [address] Phone: [phone]   Fax:  [phone]  Physical Therapy Treatment  Patient Details  Name: Christian Wiley MRN: [id number] Date of Birth: 07-Jan-1935 Referring Provider: Alonza Bogus, DO  Encounter Date: 03/07/2016      PT End of Session - 03/07/16 1456    Visit Number 15   Number of Visits 17   Date for PT Re-Evaluation 03/17/16   Authorization Type G-code every 10th visit   PT Start Time 1405   PT Stop Time 1446   PT Time Calculation (min) 41 min   Activity Tolerance Patient tolerated treatment well   Behavior During Therapy [facility] for tasks assessed/performed      Past Medical History  Diagnosis Date  . Arthritis   . AAA (abdominal aortic aneurysm) ([place])   . Hx of hiatal hernia   . GERD (gastroesophageal reflux disease)   . Hypertension   . Hyperlipidemia   . Sigmoid diverticulosis   . Pulmonary embolism ([place]) 2011 and 2014   . Nocturia   . Cancer (HCC)     hx of skin cancer   . PE (pulmonary embolism)     Past Surgical History  Procedure Laterality Date  . Abdominal aortic aneurysm repair  2001    endovascular repair  . Cholecystectomy    . Joint replacement      bilateral knee replacement  . Rotator cuff repair      bilateral  . Back surgery      laminectomy  . Back surgery    . Excision/release bursa hip Right 07/15/2014    Procedure: RIGHT HIP BURSECTOMY WITH TENDON REPAIR ;  Surgeon: Gearlean Alf, MD;  Location: [facility];  Service: Orthopedics;  Laterality: Right;  . Cataract extraction, bilateral      There were no vitals filed for this visit.  Visit Diagnosis:  Abnormality of gait  Muscle weakness of lower extremity      Subjective Assessment - 03/07/16 1409    Subjective Had a fall yesterday morning bending over to pull grass out from concrete.  Squated down and lost balance forward so went down on hands and knees.   Pertinent  History History of back issues   Limitations Walking   Patient Stated Goals Pt's goal for therapy is to be able to get around without falling.   Currently in Pain? No/denies          [facility] Adult PT Treatment/Exercise - 03/07/16 0001    Transfers   Transfers Sit to Stand;Stand to Sit   Sit to Stand 5: Supervision   Sit to Stand Details (indicate cue type and reason) repeated 10 x from 20" chair and 10 x from 18" chair without UE assist   Stand to Sit 5: Supervision   Lumbar Exercises: Aerobic   Stationary Bike Scifit level 2.5 all 4 extremities x 10 minutes with cues for intensity >55 rpm   Elliptical +             Balance Exercises - 03/07/16 1436    Balance Exercises: Standing   Step Ups Forward;6 inch;UE support 1   Step Over Hurdles / Cones stepping forward/backward across 3" high foam x 25 reps with initial bil UE assist progressing to no UE support;performed same in lateral directions           PT Education - 03/07/16 1453    Education provided Yes   Education Details Discussed importance  of community fitness   Person(s) Educated Patient   Methods Explanation   Comprehension Other (comment)  unsure if pt agrees stating "I do enough stuff around the house.  I'm not going to start exercising at this point.  I'm 81 years old"          PT Short Term Goals - 02/13/16 1134    PT SHORT TERM GOAL #1   Title Pt will perform HEP with family's supervision for improved functional mobility, balance, gait.  TARGET 02/13/16   Baseline 4   Period Weeks   Status Achieved   PT SHORT TERM GOAL #2   Title Pt will improve 5x sit<>stand test to less than or equal to 25 seconds for improved efficiency and safety with transfers.   Baseline 16.31 sec 02/13/16   Time 4   Period Weeks   Status Achieved   PT SHORT TERM GOAL #3   Title Pt will improve TUG score to less than or equal to 15 seconds for decreased fall risk.   Baseline 13 sec 02/13/16   Time 4   Period Weeks   Status  Achieved   PT SHORT TERM GOAL #4   Title Pt will improve 4-square step test to less than or equal to 20 seconds for improved balance, transition stepping.   Baseline 16.32 sec with min guard assistance   Time 4   Period Weeks   Status Partially Met   PT SHORT TERM GOAL #5   Title Pt will improve 3 minute walk test by at least 50 ft for improved gait efficiency.   Baseline 440>467 ft in 3 minute walk   Time 4   Period Weeks   Status Not Met           PT Long Term Goals - 03/07/16 1456    PT LONG TERM GOAL #1   Title Pt/family will verbalize understandng of fall prevention within the home environment.  TARGET 03/17/16   Time 8   Period Weeks   Status Achieved   PT LONG TERM GOAL #2   Title Pt will improve 5x sit<>stand to less than or equal to 20 seconds for improved efficiency and safety with transfers.   Time 8   Period Weeks   Status New   PT LONG TERM GOAL #3   Title Pt will improve gait velocity to at least 2.2 ft/sec for improved gait efficiency and safety.   Time 8   Period Weeks   Status New   PT LONG TERM GOAL #4   Title Pt will ambulate at least 1000 ft indoor and outdoor surfaces, with supervision, device as needed, for improved community gait.   Time 8   Period Weeks   Status New   PT LONG TERM GOAL #5   Title Pt/family will verbalize understanding of local Parkinson's disease resources.   Time 8   Period Weeks   Status New               Plan - 03/07/16 1456    Clinical Impression Statement Pt continues to be easily distracted and "stalls" therapy with conversation.  Discussed importance of community fitness upon d/c but pt states that he does not plan on continuuing exercises or changing his current routine.  States he does not think he needs to do exercises/walking since he does household chores.  Pt met LTG #1 for fall prevention strategies.  Continue PT per POC.   Pt will benefit from skilled therapeutic intervention in  order to improve on the  following deficits Abnormal gait;Decreased activity tolerance;Decreased balance;Decreased mobility;Decreased strength;Difficulty walking;Impaired flexibility;Postural dysfunction;Pain   Rehab Potential Good   PT Frequency 2x / week   PT Duration 8 weeks   PT Treatment/Interventions ADLs/Self Care Home Management;Therapeutic exercise;Therapeutic activities;Functional mobility training;Gait training;Balance training;Neuromuscular re-education;Patient/family education   PT Next Visit Plan Begin checking goals.    Consulted and Agree with Plan of Care Patient        Problem List Patient Active Problem List   Diagnosis Date Noted  . Greater trochanteric bursitis of right hip 07/15/2014  . Bursitis of hip 07/15/2014  . Pulmonary embolism ([place]) 10/01/2013    Class: Acute  . Thrombocytopenia ([place]) 10/01/2013    Class: Acute  . Hypertension 10/01/2013    Class: Chronic  . Abdominal aneurysm without mention of rupture 01/15/2012    Narda Bonds 03/07/2016, 2:59 PM  [facility] [address] [place], [place], [address] Phone: [phone]   Fax:  [phone]  Name: LATRAVION GRAVES MRN: [id number] Date of Birth: 12/28/1934    Narda Bonds, [place] 03/07/2016 2:59 PM Phone: [phone] Fax: [phone]

## 2016-03-12 ENCOUNTER — Ambulatory Visit: Payer: PPO | Attending: Neurology | Admitting: Physical Therapy

## 2016-03-12 DIAGNOSIS — R2689 Other abnormalities of gait and mobility: Secondary | ICD-10-CM

## 2016-03-12 NOTE — Therapy (Signed)
[facility] [address] [place], [place], [address] Phone: [phone]   Fax:  [phone]  Physical Therapy Treatment  Patient Details  Name: Christian Wiley MRN: [id number] Date of Birth: [birth year] Referring Provider: Alonza Bogus, DO  Encounter Date: 03/12/2016      PT End of Session - 03/12/16 1020    Visit Number 16   Date for PT Re-Evaluation 03/17/16   Authorization Type G-code every 10th visit   PT Start Time 0936  Pt arrives late   PT Stop Time 1006  Pt discharged after goals assessed   PT Time Calculation (min) 30 min   Equipment Utilized During Treatment Gait belt   Activity Tolerance Patient tolerated treatment well   Behavior During Therapy [facility] for tasks assessed/performed      Past Medical History  Diagnosis Date  . Arthritis   . AAA (abdominal aortic aneurysm) ([place])   . Hx of hiatal hernia   . GERD (gastroesophageal reflux disease)   . Hypertension   . Hyperlipidemia   . Sigmoid diverticulosis   . Pulmonary embolism ([place]) 2011 and 2014   . Nocturia   . Cancer (HCC)     hx of skin cancer   . PE (pulmonary embolism)     Past Surgical History  Procedure Laterality Date  . Abdominal aortic aneurysm repair  2001    endovascular repair  . Cholecystectomy    . Joint replacement      bilateral knee replacement  . Rotator cuff repair      bilateral  . Back surgery      laminectomy  . Back surgery    . Excision/release bursa hip Right 07/15/2014    Procedure: RIGHT HIP BURSECTOMY WITH TENDON REPAIR ;  Surgeon: Gearlean Alf, MD;  [facility];  Service: Orthopedics;  Laterality: Right;  . Cataract extraction, bilateral      There were no vitals filed for this visit.  Visit Diagnosis:  Other abnormalities of gait and mobility      Subjective Assessment - 03/12/16 0939    Subjective No pain, no injuries    Patient Stated Goals Pt's goal for therapy is to be able to get around  without falling.   Currently in Pain? No/denies                         [facility] Adult PT Treatment/Exercise - 03/12/16 0942    Transfers   Transfers Sit to Stand;Stand to Sit   Sit to Stand 5: Supervision   Five time sit to stand comments  17.09   Stand to Sit 5: Supervision   Ambulation/Gait   Ambulation/Gait Yes   Ambulation/Gait Assistance 5: Supervision   Ambulation/Gait Assistance Details Generally slowed gait pattern on outdoor surfaces; needs cues for improved foot clearance.  Pt c/o fatigue in lower extremities after about 700 ft of gait.   Ambulation Distance (Feet) 1000 Feet  on outdoor sidewalk surfaces, inclines/declines   Assistive device None  Offered cane, pt did not want to use   Gait Pattern Step-through pattern;Decreased arm swing - right;Decreased arm swing - left;Decreased step length - left;Decreased trunk rotation;Narrow base of support;Poor foot clearance - left   Ambulation Surface Level;Indoor;Unlevel;Outdoor  sidewalks, grass   Gait velocity 14.1= 2.33 ft/sec   Timed Up and Go Test   Normal TUG (seconds) 14.82   Self-Care   Self-Care Other Self-Care Comments   Other Self-Care Comments  Reviewed  progress towards goals, plans for discharge.  Discussed again importance of continuing exercises, community fitness (pt states he goes to [facility] [employer] [facility] 2x/wk), importance of continued walking for improved gait endurance.  Also discussed plans for return screen vs. eval.  Pt would like to return in 6 months for PT eval.  Discussed and provided information on [employer]'s group.                PT Education - 03/12/16 1019    Education provided Yes   Education Details [employer], progress with PT, plans for discharge this visit (see also self care comments)   Person(s) Educated Patient   Methods Explanation   Comprehension Verbalized understanding          PT Short Term Goals - 02/13/16 1134    PT  SHORT TERM GOAL #1   Title Pt will perform HEP with family's supervision for improved functional mobility, balance, gait.  TARGET 02/13/16   Baseline 4   Period Weeks   Status Achieved   PT SHORT TERM GOAL #2   Title Pt will improve 5x sit<>stand test to less than or equal to 25 seconds for improved efficiency and safety with transfers.   Baseline 16.31 sec 02/13/16   Time 4   Period Weeks   Status Achieved   PT SHORT TERM GOAL #3   Title Pt will improve TUG score to less than or equal to 15 seconds for decreased fall risk.   Baseline 13 sec 02/13/16   Time 4   Period Weeks   Status Achieved   PT SHORT TERM GOAL #4   Title Pt will improve 4-square step test to less than or equal to 20 seconds for improved balance, transition stepping.   Baseline 16.32 sec with min guard assistance   Time 4   Period Weeks   Status Partially Met   PT SHORT TERM GOAL #5   Title Pt will improve 3 minute walk test by at least 50 ft for improved gait efficiency.   Baseline 440>467 ft in 3 minute walk   Time 4   Period Weeks   Status Not Met           PT Long Term Goals - 03/12/16 0940    PT LONG TERM GOAL #1   Title Pt/family will verbalize understandng of fall prevention within the home environment.  TARGET 03/17/16   Time 8   Period Weeks   Status Achieved   PT LONG TERM GOAL #2   Title Pt will improve 5x sit<>stand to less than or equal to 20 seconds for improved efficiency and safety with transfers.   Baseline 17.09 sec 03/12/16   Time 8   Period Weeks   Status Achieved   PT LONG TERM GOAL #3   Title Pt will improve gait velocity to at least 2.2 ft/sec for improved gait efficiency and safety.   Baseline 2.33 ft/sec 03/12/16   Time 8   Period Weeks   Status Achieved   PT LONG TERM GOAL #4   Title Pt will ambulate at least 1000 ft indoor and outdoor surfaces, with supervision, device as needed, for improved community gait.   Time 8   Period Weeks   Status Achieved   PT LONG TERM GOAL #5    Title Pt/family will verbalize understanding of local Parkinson's disease resources.   Time 8   Period Weeks   Status Achieved  Plan - 2016/03/30 1021    Clinical Impression Statement Goals checked this visit, with pt meeting all 5 LTGs.  Pt was encouraged to use cane for long distance gait and to continue walking for exercise.  He also says he will continue going to [facility] twice per [facility].  Pt in agreement for discharge this visit, with recommendation for return PT eval in 6 months due to progressive nature of disease.   Pt will benefit from skilled therapeutic intervention in order to improve on the following deficits Abnormal gait;Decreased activity tolerance;Decreased balance;Decreased mobility;Decreased strength;Difficulty walking;Impaired flexibility;Postural dysfunction;Pain   Rehab Potential Good   PT Frequency 2x / week   PT Duration 8 weeks   PT Treatment/Interventions ADLs/Self Care Home Management;Therapeutic exercise;Therapeutic activities;Functional mobility training;Gait training;Balance training;Neuromuscular re-education;Patient/family education   PT Next Visit Plan Pt discharged this visit.   Consulted and Agree with Plan of Care Patient          G-Codes - 03/30/2016 1024    Functional Assessment Tool Used TUG 14.82 sec, 5x sit<>stand 17.09 sec, gait velocity 2.33 ft/sec   Functional Limitation Mobility: Walking and moving around   Mobility: Walking and Moving Around Goal Status [phone]) At least 20 percent but less than 40 percent impaired, limited or restricted   Mobility: Walking and Moving Around Discharge Status [phone]) At least 20 percent but less than 40 percent impaired, limited or restricted      Problem List Patient Active Problem List   Diagnosis Date Noted  . Greater trochanteric bursitis of right hip 07/15/2014  . Bursitis of hip 07/15/2014  . Pulmonary embolism ([place]) 10/01/2013    Class: Acute  . Thrombocytopenia  ([place]) 10/01/2013    Class: Acute  . Hypertension 10/01/2013    Class: Chronic  . Abdominal aneurysm without mention of rupture 01/15/2012    Sehar Sedano W. 30-Mar-2016, 10:15 PM  Frazier Butt., PT  [facility] [address] [place], [place], [address] Phone: [phone]   Fax:  [phone]  Name: ATUL DELUCIA MRN: [id number] Date of Birth: 1935/08/29  PHYSICAL THERAPY DISCHARGE SUMMARY  Visits from Start of Care: 16  Current functional level related to goals / functional outcomes: See goal check above-pt has met all LTGs.   Remaining deficits: Endurance, balance, gait safety (pt chooses not to use cane)   Education / Equipment: Pt has been educated in [employer], fall prevention, community resources for PD.    Plan: Patient agrees to discharge.  Patient goals were met. Patient is being discharged due to meeting the stated rehab goals.  ?????Pt is pleased with his current function.  He would benefit from return to PT evaluation in 6 months due to nature of disease process.   Mady Haagensen, PT 03-30-16 10:18 PM Phone: [phone] Fax: [phone]

## 2016-03-14 ENCOUNTER — Ambulatory Visit: Payer: PPO | Admitting: Physical Therapy

## 2016-03-21 ENCOUNTER — Ambulatory Visit (INDEPENDENT_AMBULATORY_CARE_PROVIDER_SITE_OTHER): Payer: PPO | Admitting: Neurology

## 2016-03-21 ENCOUNTER — Encounter: Payer: Self-pay | Admitting: Neurology

## 2016-03-21 VITALS — BP 110/62 | HR 57 | Ht 72.0 in | Wt 232.0 lb

## 2016-03-21 DIAGNOSIS — G214 Vascular parkinsonism: Secondary | ICD-10-CM | POA: Diagnosis not present

## 2016-03-21 NOTE — Progress Notes (Signed)
Christian Wiley Mask was seen today in the movement disorders clinic for neurologic consultation at the request of [name],[name], MD.  The consultation is for the evaluation of gait change and to r/o PD.  This patient is accompanied in the office by his spouse who supplements the history.   Pt states that he has been seen by his chiropractor, [name], for LBP and she recommended he get evaluated by a neurologist as she thought he had parkinsonian features.    09/19/15 update:  Pt returns for f/u accompanied by his wife who supplements the hx.  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  We decided to give him a trial of levodopa.  Today, the patient states that he is only taking it bid instead of the prescribed tid.  He is usually taking it at 10am and 5pm.  Tremor is improved.   He did have an MRI of the brain without gadolinium on 06/28/2015.  There was no significant change compared to his prior MRI of the brain in 2007.  There is evidence of mild small vessel disease.  PT was refused last visit.  He is c/o fatigue.  Has taken his carbidopa/levodopa 25/100 this AM but has not taken his bystolic.  12/21/15 update:  The patient is following up today, accompanied by his wife who supplements the history.  The patient has history of vascular parkinsonism.  Last visit, he was only taking his levodopa twice per day, and I encouraged him to take it 3 times per day as directed.  His wife states that he still doesn't get his midday dose in time.  He was supposed to be taking the medication at 10am (wake up time)/2 pm/6pm.  He has not wanted to attend physical therapy, but continues to exercise at the gym.  He has not had any falls, but does admit that he "slid" out of the bed trying to get out (got new higher mattress).  No hallucinations.  He has had episodes of confusion and will sometimes ask his wife, "who will keep the kids."  He stopped xanax about a week ago and confusion seems  better.  He takes 2 hydrocodone at bedtime for sleep.  That hasn't helped sleep but he is resistant to giving up the medication. No lightheadedness or near syncope.  03/21/16 update:  The patient follows up today, accompanied by his wife who supplements the history.  The patient has a history of vascular parkinsonism.  Last visit, I again encouraged the patient to take his carbidopa/levodopa 3 times per day as opposed to the twice per day that he was previously taking it.  He did set an alarm but is still inconsistent with that.   He did attend physical therapy since our last visit, and I congratulated him on that.  Last visit, we talked about memory loss and I thought that medications contribute, particularly the hydrocodone and I encouraged him to decrease that to 1 tablet per night and add melatonin for sleep.  He did decrease the hydrocodone.  The melatonin didn't help him sleep.  Wife states that memory has been stable but still has trouble with telling the day.  That is not new.  Neuroimaging has previously been performed.  It is not available for my review today.  States that he had a CT brain a few years ago after a fall.      ALLERGIES:   Allergies  Allergen Reactions  . Ambien [Zolpidem Tartrate]  Goes wild, gets anxious and confused with [place]    CURRENT MEDICATIONS:  Outpatient Encounter Prescriptions as of 03/21/2016  Medication Sig  . allopurinol (ZYLOPRIM) 300 MG tablet Take 300 mg by mouth every morning.   Marland Kitchen ALPRAZolam (XANAX) 0.5 MG tablet Take 0.5 mg by mouth at bedtime.   Marland Kitchen amLODipine (NORVASC) 5 MG tablet Take 2.5 mg by mouth daily.  Marland Kitchen amoxicillin (AMOXIL) 500 MG capsule Take 500 mg by mouth as needed. For dental work  . atorvastatin (LIPITOR) 40 MG tablet Take 1 tablet by mouth at bedtime.   . carbidopa-levodopa (SINEMET IR) 25-100 MG tablet TAKE 1 TABLET BY MOUTH 3 (THREE) TIMES DAILY.  Marland Kitchen docusate sodium (COLACE) 100 MG capsule Take 100 mg by mouth daily as needed for  mild constipation.  . DULoxetine (CYMBALTA) 30 MG capsule Take 30 mg by mouth daily.  Marland Kitchen HYDROcodone-acetaminophen (NORCO/VICODIN) 5-325 MG tablet TAKE 1 TABLET EVERY 4 HOURS AS NEEDED FOR PAIN  . levothyroxine (SYNTHROID, LEVOTHROID) 25 MCG tablet Take 25 mcg by mouth daily.  . meclizine (ANTIVERT) 25 MG tablet Take 25 mg by mouth 2 (two) times daily as needed for dizziness.   . nebivolol (BYSTOLIC) 10 MG tablet Take 10 mg by mouth every morning.  . Rivaroxaban (XARELTO) 20 MG TABS tablet Take 1 tablet (20 mg total) by mouth daily with supper.  . [DISCONTINUED] methocarbamol (ROBAXIN) 500 MG tablet Take 1 tablet (500 mg total) by mouth every 6 (six) hours as needed for muscle spasms. (Patient not taking: Reported on 01/16/2016)   No facility-administered encounter medications on file as of 03/21/2016.    PAST MEDICAL HISTORY:   Past Medical History  Diagnosis Date  . Arthritis   . AAA (abdominal aortic aneurysm) ([place])   . Hx of hiatal hernia   . GERD (gastroesophageal reflux disease)   . Hypertension   . Hyperlipidemia   . Sigmoid diverticulosis   . Pulmonary embolism ([place]) 2011 and 2014   . Nocturia   . Cancer (HCC)     hx of skin cancer   . PE (pulmonary embolism)     PAST SURGICAL HISTORY:   Past Surgical History  Procedure Laterality Date  . Abdominal aortic aneurysm repair  2001    endovascular repair  . Cholecystectomy    . Joint replacement      bilateral knee replacement  . Rotator cuff repair      bilateral  . Back surgery      laminectomy  . Back surgery    . Excision/release bursa hip Right 07/15/2014    Procedure: RIGHT HIP BURSECTOMY WITH TENDON REPAIR ;  Surgeon: Gearlean Alf, MD;  Location: [facility];  Service: Orthopedics;  Laterality: Right;  . Cataract extraction, bilateral      SOCIAL HISTORY:   Social History   Social History  . Marital Status: Married    Spouse Name: N/A  . Number of Children: N/A  . Years of Education: N/A   Occupational  History  . Retired     [employer], parts dept   Social History Main Topics  . Smoking status: Former Smoker    Quit date: 10/01/1974  . Smokeless tobacco: Never Used  . Alcohol Use: No  . Drug Use: No  . Sexual Activity: Not Currently   Other Topics Concern  . Not on file   Social History Narrative   Married   2 daughters, 1 son    FAMILY HISTORY:   Family Status  Relation  Status Death Age  . Mother Deceased     alzheimer dementia  . Father Deceased     CAD  . Brother Alive     healthy  . Sister Deceased     breast CA  . Sister Alive     DJD  . Child Alive     3, healthy    ROS:  A complete 10 system review of systems was obtained and was unremarkable apart from what is mentioned above.  PHYSICAL EXAMINATION:    VITALS:   Filed Vitals:   03/21/16 0909  BP: 110/62  Pulse: 57  Height: 6' (1.829 m)  Weight: 232 lb (105.235 kg)    GEN:  The patient appears stated age and is in NAD. HEENT:  Normocephalic, atraumatic.  The mucous membranes are moist. The superficial temporal arteries are without ropiness or tenderness. CV:  Bradycardic.  Regular rhythm Lungs:  CTAB Neck/HEME:  There are no carotid bruits bilaterally.  Neurological examination:  Orientation:  Montreal Cognitive Assessment  03/21/2016  Visuospatial/ Executive (0/5) 1  Naming (0/3) 2  Attention: Read list of digits (0/2) 2  Attention: Read list of letters (0/1) 1  Attention: Serial 7 subtraction starting at 100 (0/3) 1  Language: Repeat phrase (0/2) 1  Language : Fluency (0/1) 1  Abstraction (0/2) 1  Delayed Recall (0/5) 1  Orientation (0/6) 3  Total 14  Adjusted Score (based on education) 15    Cranial nerves: There is good facial symmetry. Pupils are equal round and reactive to light bilaterally. Fundoscopic exam reveals clear margins bilaterally. Extraocular muscles are intact. The visual fields are full to confrontational testing. The speech is fluent and clear. Soft palate  rises symmetrically and there is no tongue deviation. Hearing is intact to conversational tone. Sensation: Sensation is intact to light touch throughout Motor: Strength is 5/5 in the bilateral upper and lower extremities.   Shoulder shrug is equal and symmetric.  There is no pronator drift.   Movement examination: Tone: There is normal tone in the bilateral upper extremities.  The tone in the lower extremities is normal.  Abnormal movements: There is no tremor today Coordination:  There is no decremation with RAM's Gait and Station: The patient is mildly short stepped with decreased arm swing on the left.    ASSESSMENT/PLAN:  1.  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  He looks better now that he is taking it tid. (10am/2pm/7pm) and did PT  -We did talk about safe, cardiovascular exercise.  The patient does go to the gym and rides the bike.  Encouraged him to continue this and to push his exercise safely.  2.  Probable dementia  -MoCA 15 on 03/21/2016.  Doesn't want to add more medication.  Needs more physical exercise and discussed 3.  LBP  -The patient has seen Dr. Carloyn Manner and Dr. Nelva Bush along with chiropractics.  . While I did see he had an MRI of his lumbar spine in 2014 that demonstrated moderate to severe neural foraminal stenosis at L4-L5 and significant degenerative changes, they are not interested in further surgery or injections and have failed alternative (chiropractic) tx.  Sports med (Dr. Tamala Julian) may be of value but will leave that to discretion of PCP. 4.  Follow up is anticipated in the next few months, sooner should new neurologic issues arise.  Much greater than 50% of this visit was spent in counseling with the patient and the wife.  Total face to face time:  30 min

## 2016-04-12 DIAGNOSIS — Z961 Presence of intraocular lens: Secondary | ICD-10-CM | POA: Diagnosis not present

## 2016-04-12 DIAGNOSIS — Z0100 Encounter for examination of eyes and vision without abnormal findings: Secondary | ICD-10-CM | POA: Diagnosis not present

## 2016-04-27 ENCOUNTER — Other Ambulatory Visit: Payer: Self-pay | Admitting: Neurology

## 2016-04-29 DIAGNOSIS — E038 Other specified hypothyroidism: Secondary | ICD-10-CM | POA: Diagnosis not present

## 2016-04-29 DIAGNOSIS — F1320 Sedative, hypnotic or anxiolytic dependence, uncomplicated: Secondary | ICD-10-CM | POA: Diagnosis not present

## 2016-04-29 DIAGNOSIS — I10 Essential (primary) hypertension: Secondary | ICD-10-CM | POA: Diagnosis not present

## 2016-04-29 DIAGNOSIS — G20 Parkinson's disease: Secondary | ICD-10-CM | POA: Diagnosis not present

## 2016-04-29 DIAGNOSIS — M1990 Unspecified osteoarthritis, unspecified site: Secondary | ICD-10-CM | POA: Diagnosis not present

## 2016-04-29 DIAGNOSIS — M4726 Other spondylosis with radiculopathy, lumbar region: Secondary | ICD-10-CM | POA: Diagnosis not present

## 2016-04-29 DIAGNOSIS — Z6830 Body mass index (BMI) 30.0-30.9, adult: Secondary | ICD-10-CM | POA: Diagnosis not present

## 2016-04-29 DIAGNOSIS — R7302 Impaired glucose tolerance (oral): Secondary | ICD-10-CM | POA: Diagnosis not present

## 2016-04-29 DIAGNOSIS — R5383 Other fatigue: Secondary | ICD-10-CM | POA: Diagnosis not present

## 2016-04-29 DIAGNOSIS — I2699 Other pulmonary embolism without acute cor pulmonale: Secondary | ICD-10-CM | POA: Diagnosis not present

## 2016-04-29 DIAGNOSIS — R001 Bradycardia, unspecified: Secondary | ICD-10-CM | POA: Diagnosis not present

## 2016-04-29 DIAGNOSIS — E784 Other hyperlipidemia: Secondary | ICD-10-CM | POA: Diagnosis not present

## 2016-04-29 NOTE — Telephone Encounter (Signed)
Carbidopa Levodopa refill requested. Per last office note- patient to remain on medication. Refill approved and sent to patient's pharmacy.   

## 2016-04-29 NOTE — Telephone Encounter (Signed)
Pt needs a refilled of the sinemet called [facility] at [phone] please call patient when this has been done at [phone]

## 2016-05-16 ENCOUNTER — Ambulatory Visit (INDEPENDENT_AMBULATORY_CARE_PROVIDER_SITE_OTHER): Payer: PPO | Admitting: Neurology

## 2016-05-16 ENCOUNTER — Encounter: Payer: Self-pay | Admitting: Neurology

## 2016-05-16 VITALS — BP 150/80 | HR 82 | Resp 20 | Ht 72.0 in | Wt 219.0 lb

## 2016-05-16 DIAGNOSIS — G218 Other secondary parkinsonism: Secondary | ICD-10-CM

## 2016-05-16 DIAGNOSIS — I2782 Chronic pulmonary embolism: Secondary | ICD-10-CM

## 2016-05-16 DIAGNOSIS — G214 Vascular parkinsonism: Secondary | ICD-10-CM

## 2016-05-16 DIAGNOSIS — G4752 REM sleep behavior disorder: Secondary | ICD-10-CM

## 2016-05-16 DIAGNOSIS — F01518 Vascular dementia, unspecified severity, with other behavioral disturbance: Secondary | ICD-10-CM

## 2016-05-16 DIAGNOSIS — F482 Pseudobulbar affect: Secondary | ICD-10-CM | POA: Diagnosis not present

## 2016-05-16 DIAGNOSIS — G478 Other sleep disorders: Secondary | ICD-10-CM

## 2016-05-16 DIAGNOSIS — F0151 Vascular dementia with behavioral disturbance: Secondary | ICD-10-CM | POA: Diagnosis not present

## 2016-05-16 MED ORDER — CLONAZEPAM 0.125 MG PO TBDP: 0.1250 mg | ORAL_TABLET | Freq: Every day | ORAL | Status: DC

## 2016-05-16 MED ORDER — CLONAZEPAM 0.125 MG PO TBDP: 0.1250 mg | ORAL_TABLET | Freq: Two times a day (BID) | ORAL | Status: DC
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
## 2016-05-16 NOTE — Patient Instructions (Addendum)
Clonazepam tablets What is this medicine? CLONAZEPAM (kloe NA ze pam) is a benzodiazepine. It is used to treat certain types of seizures. It is also used to treat panic disorder. This medicine may be used for other purposes; ask your health care provider or pharmacist if you have questions. What should I tell my health care provider before I take this medicine? They need to know if you have any of these conditions: -an alcohol or drug abuse problem -bipolar disorder, depression, psychosis or other mental health condition -glaucoma -kidney or liver disease -lung or breathing disease -myasthenia gravis -Parkinson's disease -porphyria -seizures or a history of seizures -suicidal thoughts -an unusual or allergic reaction to clonazepam, other benzodiazepines, foods, dyes, or preservatives -pregnant or trying to get pregnant -breast-feeding How should I use this medicine? Take this medicine by mouth with a glass of water. Follow the directions on the prescription label. If it upsets your stomach, take it with food or milk. Take your medicine at regular intervals. Do not take it more often than directed. Do not stop taking or change the dose except on the advice of your doctor or health care professional. A special MedGuide will be given to you by the pharmacist with each prescription and refill. Be sure to read this information carefully each time. Talk to your pediatrician regarding the use of this medicine in children. Special care may be needed. Overdosage: If you think you have taken too much of this medicine contact a poison control center or emergency room at once. NOTE: This medicine is only for you. Do not share this medicine with others. What if I miss a dose? If you miss a dose, take it as soon as you can. If it is almost time for your next dose, take only that dose. Do not take double or extra doses. What may interact with this medicine? -herbal or dietary supplements -medicines for  depression, anxiety, or psychotic disturbances -medicines for fungal infections like fluconazole, itraconazole, ketoconazole, voriconazole -medicines for HIV infection or AIDS -medicines for sleep -prescription pain medicines -propantheline -rifampin -sevelamer -some medicines for seizures like carbamazepine, phenobarbital, phenytoin, primidone This list may not describe all possible interactions. Give your health care provider a list of all the medicines, herbs, non-prescription drugs, or dietary supplements you use. Also tell them if you smoke, drink alcohol, or use illegal drugs. Some items may interact with your medicine. What should I watch for while using this medicine? Visit your doctor or health care professional for regular checks on your progress. Your body may become dependent on this medicine. If you have been taking this medicine regularly for some time, do not suddenly stop taking it. You must gradually reduce the dose or you may get severe side effects. Ask your doctor or health care professional for advice before increasing or decreasing the dose. Even after you stop taking this medicine it can still affect your body for several days. If you suffer from several types of seizures, this medicine may increase the chance of grand mal seizures (epilepsy). Let your doctor or health care professional know, he or she may want to prescribe an additional medicine. You may get drowsy or dizzy. Do not drive, use machinery, or do anything that needs mental alertness until you know how this medicine affects you. To reduce the risk of dizzy and fainting spells, do not stand or sit up quickly, especially if you are an older patient. Alcohol may increase dizziness and drowsiness. Avoid alcoholic drinks. Do not treat   yourself for coughs, colds or allergies without asking your doctor or health care professional for advice. Some ingredients can increase possible side effects. The use of this medicine may  increase the chance of suicidal thoughts or actions. Pay special attention to how you are responding while on this medicine. Any worsening of mood, or thoughts of suicide or dying should be reported to your health care professional right away. Women who become pregnant while using this medicine may enroll in the [place] Pregnancy Registry by calling [phone]. This registry collects information about the safety of antiepileptic drug use during pregnancy. What side effects may I notice from receiving this medicine? Side effects that you should report to your doctor or health care professional as soon as possible: -allergic reactions like skin rash, itching or hives, swelling of the face, lips, or tongue -changes in vision -confusion -depression -hallucinations -mood changes, excitability or aggressive behavior -movement difficulty, staggering or jerky movements -muscle cramps, weakness -tremors -unusual eye movements Side effects that usually do not require medical attention (report to your doctor or health care professional if they continue or are bothersome): -constipation or diarrhea -difficulty sleeping, nightmares -dizziness, drowsiness -headache -increased saliva from your mouth -nausea, vomiting This list may not describe all possible side effects. Call your doctor for medical advice about side effects. You may report side effects to FDA at 1-800-FDA-1088. Where should I keep my medicine? Keep out of the reach of children. This medicine can be abused. Keep your medicine in a safe place to protect it from theft. Do not share this medicine with anyone. Selling or giving away this medicine is dangerous and against the law. This medicine may cause accidental overdose and death if taken by other adults, children, or pets. Mix any unused medicine with a substance like cat litter or coffee grounds. Then throw the medicine away in a sealed container like a sealed  bag or a coffee can with a lid. Do not use the medicine after the expiration date. Store at room temperature between 15 and 30 degrees C (59 and 86 degrees F). Protect from light. Keep container tightly closed. NOTE: This sheet is a summary. It may not cover all possible information. If you have questions about this medicine, talk to your doctor, pharmacist, or health care provider.    2016, Elsevier/Gold Standard. (2015-03-07 14:01:43) Insomnia Insomnia is a sleep disorder that makes it difficult to fall asleep or to stay asleep. Insomnia can cause tiredness (fatigue), low energy, difficulty concentrating, mood swings, and poor performance at work or school.  There are three different ways to classify insomnia:  Difficulty falling asleep.  Difficulty staying asleep.  Waking up too early in the morning. Any type of insomnia can be long-term (chronic) or short-term (acute). Both are common. Short-term insomnia usually lasts for three months or less. Chronic insomnia occurs at least three times a week for longer than three months. CAUSES  Insomnia may be caused by another condition, situation, or substance, such as:  Anxiety.  Certain medicines.  Gastroesophageal reflux disease (GERD) or other gastrointestinal conditions.  Asthma or other breathing conditions.  Restless legs syndrome, sleep apnea, or other sleep disorders.  Chronic pain.  Menopause. This may include hot flashes.  Stroke.  Abuse of alcohol, tobacco, or illegal drugs.  Depression.  Caffeine.   Neurological disorders, such as Alzheimer disease.  An overactive thyroid (hyperthyroidism). The cause of insomnia may not be known. RISK FACTORS Risk factors for insomnia include:  Gender. Women  are more commonly affected than men.  Age. Insomnia is more common as you get older.  Stress. This may involve your professional or personal life.  Income. Insomnia is more common in people with lower income.  Lack  of exercise.   Irregular work schedule or night shifts.  Traveling between different time zones. SIGNS AND SYMPTOMS If you have insomnia, trouble falling asleep or trouble staying asleep is the main symptom. This may lead to other symptoms, such as:  Feeling fatigued.  Feeling nervous about going to sleep.  Not feeling rested in the morning.  Having trouble concentrating.  Feeling irritable, anxious, or depressed. TREATMENT  Treatment for insomnia depends on the cause. If your insomnia is caused by an underlying condition, treatment will focus on addressing the condition. Treatment may also include:   Medicines to help you sleep.  Counseling or therapy.  Lifestyle adjustments. HOME CARE INSTRUCTIONS   Take medicines only as directed by your health care provider.  Keep regular sleeping and waking hours. Avoid naps.  Keep a sleep diary to help you and your health care provider figure out what could be causing your insomnia. Include:   When you sleep.  When you wake up during the night.  How well you sleep.   How rested you feel the next day.  Any side effects of medicines you are taking.  What you eat and drink.   Make your bedroom a comfortable place where it is easy to fall asleep:  Put up shades or special blackout curtains to block light from outside.  Use a white noise machine to block noise.  Keep the temperature cool.   Exercise regularly as directed by your health care provider. Avoid exercising right before bedtime.  Use relaxation techniques to manage stress. Ask your health care provider to suggest some techniques that may work well for you. These may include:  Breathing exercises.  Routines to release muscle tension.  Visualizing peaceful scenes.  Cut back on alcohol, caffeinated beverages, and cigarettes, especially close to bedtime. These can disrupt your sleep.  Do not overeat or eat spicy foods right before bedtime. This can lead to  digestive discomfort that can make it hard for you to sleep.  Limit screen use before bedtime. This includes:  Watching TV.  Using your smartphone, tablet, and computer.  Stick to a routine. This can help you fall asleep faster. Try to do a quiet activity, brush your teeth, and go to bed at the same time each night.  Get out of bed if you are still awake after 15 minutes of trying to sleep. Keep the lights down, but try reading or doing a quiet activity. When you feel sleepy, go back to bed.  Make sure that you drive carefully. Avoid driving if you feel very sleepy.  Keep all follow-up appointments as directed by your health care provider. This is important. SEEK MEDICAL CARE IF:   You are tired throughout the day or have trouble in your daily routine due to sleepiness.  You continue to have sleep problems or your sleep problems get worse. SEEK IMMEDIATE MEDICAL CARE IF:   You have serious thoughts about hurting yourself or someone else.   This information is not intended to replace advice given to you by your health care provider. Make sure you discuss any questions you have with your health care provider.  Please remember to try to maintain good sleep hygiene, which means: Keep a regular sleep and wake schedule, try not  to exercise or have a meal within 2 hours of your bedtime, try to keep your bedroom conducive for sleep, that is, cool and dark, without light distractors such as an illuminated alarm clock, and refrain from watching TV right before sleep or in the middle of the night and do not keep the TV or radio on during the night. Also, try not to use or play on electronic devices at bedtime, such as your cell phone, tablet PC or laptop. If you like to read at bedtime on an electronic device, try to dim the background light as much as possible. Do not eat in the middle of the night.   We will request a sleep study.    We will look for leg twitching and snoring or sleep apnea.    For chronic insomnia, you are best followed by a psychiatrist and/or sleep psychologist.   We will call you with the sleep study results and make a follow up appointment if needed.      Document Released: 11/22/2000 Document Revised: 08/16/2015 Document Reviewed: 08/26/2014 Elsevier Interactive Patient Education [employer].

## 2016-05-20 ENCOUNTER — Telehealth: Payer: Self-pay

## 2016-05-20 NOTE — Telephone Encounter (Signed)
RX for klonopin faxed to [facility] on [address]. Received a receipt of confirmation.

## 2016-06-04 ENCOUNTER — Ambulatory Visit (INDEPENDENT_AMBULATORY_CARE_PROVIDER_SITE_OTHER): Payer: PPO | Admitting: Neurology

## 2016-06-04 DIAGNOSIS — G478 Other sleep disorders: Secondary | ICD-10-CM | POA: Diagnosis not present

## 2016-06-04 DIAGNOSIS — I2782 Chronic pulmonary embolism: Secondary | ICD-10-CM

## 2016-06-04 DIAGNOSIS — F482 Pseudobulbar affect: Secondary | ICD-10-CM

## 2016-06-04 DIAGNOSIS — F01518 Vascular dementia, unspecified severity, with other behavioral disturbance: Secondary | ICD-10-CM

## 2016-06-04 DIAGNOSIS — G4752 REM sleep behavior disorder: Secondary | ICD-10-CM

## 2016-06-04 DIAGNOSIS — G214 Vascular parkinsonism: Secondary | ICD-10-CM

## 2016-06-04 DIAGNOSIS — F0151 Vascular dementia with behavioral disturbance: Secondary | ICD-10-CM

## 2016-06-20 ENCOUNTER — Telehealth: Payer: Self-pay | Admitting: Neurology

## 2016-06-20 NOTE — Telephone Encounter (Signed)
Wife called to request results of Sleep Study

## 2016-06-20 NOTE — Telephone Encounter (Signed)
I spoke to pt and advised him of his sleep study results. I advised him that his study revealed supine dependent apnea, and positional therapy may be entertained. This study does not reveal significant sleep apnea or significant periodic limb movements of sleep resulting in significant sleep disruption. There were PLMS seen during REM sleep, indicative of REM BD and medication treatment is advised. I offered pt a follow up appt, and he accepted. Scheduled for 06/24/16 at 11:00. Pt verbalized understanding of results. Pt had no questions at this time but was encouraged to call back if questions arise.

## 2016-06-24 ENCOUNTER — Encounter: Payer: Self-pay | Admitting: Neurology

## 2016-06-24 ENCOUNTER — Ambulatory Visit (INDEPENDENT_AMBULATORY_CARE_PROVIDER_SITE_OTHER): Payer: PPO | Admitting: Neurology

## 2016-06-24 VITALS — BP 102/60 | HR 86 | Resp 20 | Ht 72.0 in | Wt 229.0 lb

## 2016-06-24 DIAGNOSIS — F482 Pseudobulbar affect: Secondary | ICD-10-CM

## 2016-06-24 DIAGNOSIS — F0151 Vascular dementia with behavioral disturbance: Secondary | ICD-10-CM

## 2016-06-24 DIAGNOSIS — F0150 Vascular dementia without behavioral disturbance: Secondary | ICD-10-CM | POA: Insufficient documentation

## 2016-06-24 NOTE — Progress Notes (Signed)
SLEEP MEDICINE CLINIC   Provider:  Larey Seat, M D  Referring Provider: [name], MD Primary Care Physician:  Geoffery Lyons, MD  Chief Complaint  Patient presents with  . Follow-up    discuss sleep study results, pt has been taking klonopin in the morning, and 2 hydrocodone at night    HPI:  Christian Wiley is a 81 y.o. male , seen here as a referral from [name] for evaluation of sleep apnea, possible REM BD.   Chief complaint according to patient : It is the patient's spouse, Mrs. Cizek, who felt that her husband is snoring and irregularly breathing. He is jerking in his sleep, and he has major depression. He is tearful here when answering questions on the sleep scale and fatigue questionnaire. He is extremely fatigued but has trouble to go to sleep, and the last 2 weeks has been taking 50 mg of benadryl to help. He still has trouble sleeping. He is majorly depressed and changed 2 years ago from Citalopram to Cymbalta. He is still more depressed and tearful- pseudobulbar affect?  He has chronic back pain and has been treated with hydrocodone at bedtime. [name] Breaker tells me today that the couple has worked on Psychologist, educational term decisions, and funeral plans, and that this had to be multiple times postponed and remained incomplete due to his affect and emotional response. Both spouses are 28 years old and had long living mothers , to the age of 41.   Sleep habits are as follows: Christian Wiley endorses a usual bedtime of around 11 PM, but that doesn't mean that he will fall asleep easily. He often is awake for a couple of hours in spite of taking the Benadryl and hydrocodone. If he finally will fall asleep, he will not stay asleep. He reports waking up frequently after short intervals of sleep sometimes because a bathroom for wakes him but he does not wake up from pain or headaches. He does report a very dry mouth in the morning. His spouse feels he gets his best  sleep between 4 AM and 9 AM, and rises accordingly late.  He has had trouble falling asleep for many years with chronic insomnia. Years ago [name] prescribed Ambien but the patient had a paradox reaction of vivids dreams that he acted out. Nightmarish in Scientist, research (physical sciences). He tried OTC melatonin and it helped initially but not after 3 month. Another medication was prescribed by PCP  but was found to be contraindicated in a patient taking hydrocodone and the patient never took the first pill of that package.Christian Wiley. has been suffering from depression for at least a decade. 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   Sleep medical history and family sleep history: the patient has no relative with OSA , to his knowledge, there is also no report of insomnia. He does not have a history of sleepwalking, night terrors, but he had enuresis.  Social history: married, he quit smoking in 1974 when his child was about 88 years old, he does not drink alcohol and does know longer used tobacco in any form. He drinks caffeinated sodas Pepsi and Coke, Cheerwine. He drinks at least 2, if not 4 a day.   Interval history from 06/24/2016,  Mr. Christian Wiley was evaluated in a polysomnography on 06/04/2016 he has a history of vascular dementia-parkinsonism and pseudobulbar affect. He had an extremely mild case of apnea only 8.1 AHI per hour  of sleep that was also positional dependent the supine AHI was 33. Based on this positional component I would hold off on arranging for CPAP. He did have oxygen desaturations at night was 81.8 minutes of total desaturation time and frequent periodic limb movements. Interestingly, his periodic limb movements did not wake him. He had PLMs in REM sleep. There is definitely be at divergence of sleep perception, Mr. Shannahan did not feel that it could already be 5 AM when he was woken up -he had slept soundly in the sleep lab. . Since  melatonin doesn't work anymore for him I would use clonazepam, but this medication changed nothing in his sleep pattern.  His pharmacist and his wife had confirmed that a low dose of hydrocortisone is not contraindicated in a parallel use of a benzodiazepine. His pharmacist recommended to take Klonopin in the morning as it is more anxiolytic and then for the night to take alprazolam. This seems to be working. I would strongly recommend to stay on this regimen as indicated by pharmacist.        Primary Neurologist :  Please note additional history obtained by Alonza Bogus, DO : Christian Wiley was seen in the movement disorders clinic for consultation at the request of [name],[name], MD.  The consultation is for the evaluation of gait change and to r/o PD.  This patient is accompanied in the office by his spouse who supplements the history.   Pt states that he has been seen by his chiropractor, [name], for LBP and she recommended he get evaluated by a neurologist as she thought he had parkinsonian features.    09/19/15 update:  Pt returns for f/u accompanied by his wife who supplements the hx.  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  We decided to give him a trial of levodopa.  Today, the patient states that he is only taking it bid instead of the prescribed tid.  He is usually taking it at 10am and 5pm.  Tremor is improved.   He did have an MRI of the brain without gadolinium on 06/28/2015.  There was no significant change compared to his prior MRI of the brain in 2007.  There is evidence of mild small vessel disease.  PT was refused last visit.  He is c/o fatigue.  Has taken his carbidopa/levodopa 25/100 this AM but has not taken his bystolic. He has started taking his sinemet 3 times a day now.    Review of Systems: Out of a complete 14 system review, the patient complains of only the following symptoms, and all other reviewed systems are negative. Mr.  retelling suffers from gout, BPH, depression, he has a history of pulmonary embolism on chronic anticoagulation with xeralto, obesity, peripheral neuropathy, osteoarthritis with chronic back pain, vascular parkinsonism, pseudobulbar affect, bradycardia, excessive daytime fatigue and hypothyroidism. AA stent placement.   How likely are you to doze in the following situations: 0 = not likely, 1 = slight chance, 2 = moderate chance, 3 = high chance  Sitting and Reading?2 Watching Television?1 Sitting inactive in a public place (theater or meeting)?2 As a passenger in a car for an hour without a break?2 Lying down in the afternoon when circumstances permit?3 Sitting and talking to someone?2 Sitting quietly after lunch without alcohol?2 In a car, while stopped for a few minutes in traffic?0   Total = 14- very high    , Fatigue severity score 58 (11)  , depression score 14 out  of 15- high - significant.    Social History   Social History  . Marital Status: Married    Spouse Name: N/A  . Number of Children: N/A  . Years of Education: N/A   Occupational History  . Retired     [employer], parts dept   Social History Main Topics  . Smoking status: Former Smoker    Quit date: 10/01/1974  . Smokeless tobacco: Never Used  . Alcohol Use: No  . Drug Use: No  . Sexual Activity: Not Currently   Other Topics Concern  . Not on file   Social History Narrative   Married   2 daughters, 1 son    Family History  Problem Relation Age of Onset  . Cancer Sister   . Hyperlipidemia Brother   . Hypertension Daughter   . Hyperlipidemia Mother   . Hypertension Mother   . Hyperlipidemia Father   . Hypertension Father   . Heart disease Father   . Heart attack Father     Past Medical History  Diagnosis Date  . Arthritis   . AAA (abdominal aortic aneurysm) ([place])   . Hx of hiatal hernia   . GERD (gastroesophageal reflux disease)   . Hypertension   . Hyperlipidemia   . Sigmoid  diverticulosis   . Pulmonary embolism ([place]) 2011 and 2014   . Nocturia   . Cancer ([facility])     hx of skin cancer   . PE (pulmonary embolism)     Past Surgical History  Procedure Laterality Date  . Abdominal aortic aneurysm repair  2001    endovascular repair  . Cholecystectomy    . Joint replacement      bilateral knee replacement  . Rotator cuff repair      bilateral  . Back surgery      laminectomy  . Back surgery    . Excision/release bursa hip Right 07/15/2014    Procedure: RIGHT HIP BURSECTOMY WITH TENDON REPAIR ;  Surgeon: Gearlean Alf, MD;  Location: [facility];  Service: Orthopedics;  Laterality: Right;  . Cataract extraction, bilateral      Current Outpatient Prescriptions  Medication Sig Dispense Refill  . allopurinol (ZYLOPRIM) 300 MG tablet Take 300 mg by mouth every morning.     Marland Kitchen ALPRAZolam (XANAX) 0.5 MG tablet Take 0.5 mg by mouth at bedtime.    Marland Kitchen amLODipine (NORVASC) 5 MG tablet Take 2.5 mg by mouth daily.  5  . amoxicillin (AMOXIL) 500 MG capsule Take 500 mg by mouth as needed. For dental work    . atorvastatin (LIPITOR) 40 MG tablet Take 1 tablet by mouth at bedtime.     . carbidopa-levodopa (SINEMET IR) 25-100 MG tablet TAKE 1 TABLET BY MOUTH 3 (THREE) TIMES DAILY. 90 tablet 5  . clonazepam (KLONOPIN) 0.125 MG disintegrating tablet Take 1 tablet (0.125 mg total) by mouth at bedtime. 60 tablet 0  . DULoxetine (CYMBALTA) 30 MG capsule Take 30 mg by mouth daily.  3  . escitalopram (LEXAPRO) 10 MG tablet Take 10 mg by mouth daily.  1  . HYDROcodone-acetaminophen (NORCO/VICODIN) 5-325 MG tablet TAKE 1 TABLET EVERY 4 HOURS AS NEEDED FOR PAIN  0  . levothyroxine (SYNTHROID, LEVOTHROID) 25 MCG tablet Take 25 mcg by mouth daily.  6  . meclizine (ANTIVERT) 25 MG tablet Take 25 mg by mouth 2 (two) times daily as needed for dizziness.     . nebivolol (BYSTOLIC) 10 MG tablet Take 10 mg by mouth every  morning.    . Rivaroxaban (XARELTO) 20 MG TABS tablet Take 1 tablet (20 mg  total) by mouth daily with supper. 30 tablet 3  . temazepam (RESTORIL) 7.5 MG capsule      No current facility-administered medications for this visit.    Allergies as of 06/24/2016 - Review Complete 06/24/2016  Allergen Reaction Noted  . Ambien [zolpidem tartrate]  10/02/2013    Vitals: BP 102/60 mmHg  Pulse 86  Resp 20  Ht 6' (1.829 m)  Wt 229 lb (103.874 kg)  BMI 31.05 kg/m2 Last Weight:  Wt Readings from Last 1 Encounters:  06/24/16 229 lb (103.874 kg)   [id number] mass index is 31.05 kg/(m^2).     Last Height:   Ht Readings from Last 1 Encounters:  06/24/16 6' (1.829 m)    Physical exam:  General: The patient is awake, alert and appears not in acute distress. The patient is well groomed. Head: Normocephalic, atraumatic. Neck is supple. Mallampati 2  neck circumference:18. Nasal airflow patent ,Retrognathia is seen.  Cardiovascular:  Regular rate and rhythm, without  murmurs or carotid bruit, and without distended neck veins. Respiratory: Lungs are clear to auscultation. Skin:  Without evidence of edema, or rash Trunk:  The patient's body language is that of depression, Neurologic exam : The patient is awake and alert, oriented to place and time.   Memory subjective described as intact.  Memory testing revealed ;   [place]: Montreal Cognitive Assessment  03/21/2016  Visuospatial/ Executive (0/5) 1  Naming (0/3) 2  Attention: Read list of digits (0/2) 2  Attention: Read list of letters (0/1) 1  Attention: Serial 7 subtraction starting at 100 (0/3) 1  Language: Repeat phrase (0/2) 1  Language : Fluency (0/1) 1  Abstraction (0/2) 1  Delayed Recall (0/5) 1  Orientation (0/6) 3  Total 14  Adjusted Score (based on education) 15   Significant impairment-     Attention span & concentration ability appears impaired Speech is fluent,  without dysarthria, dysphonia or aphasia.  Mood and affect are depressed . Cranial nerves: Pupils are equal and briskly reactive to  light. Extraocular movements  in vertical and horizontal planes intact and without nystagmus. Visual fields by finger perimetry are intact. Hearing to finger rub intact.   Facial sensation intact to fine touch.  Facial motor strength is symmetric and tongue and uvula move midline. Shoulder shrug was symmetrical.   Motor exam:  Regular  tone, muscle bulk and symmetric strength in all extremities. No cogwheeling.   Sensory:  Fine touch, pinprick and vibration were tested in all extremities. Proprioception tested in the upper extremities was normal.  Coordination: Rapid alternating movements -Finger-to-nose maneuver normal without evidence of ataxia, dysmetria or tremor.  Gait and station: Patient walks without assistive device and is able unassisted to climb up to the exam table. Strength within normal limits.  Stance is stable and normal.    Deep tendon reflexes: in the  upper and lower extremities are symmetric and intact.  The patient was advised of the nature of the diagnosed sleep disorder , the treatment options and risks for general a health and wellness arising from not treating the condition.  I spent more than 45 minutes of face to face time with the patient. Greater than 50% of time was spent in counseling and coordination of care. We have discussed the diagnosis and differential and I answered the patient's questions.     Assessment:  After physical and neurologic examination, review of  laboratory studies,  Personal review of imaging studies, reports of other /same  Imaging studies ,  Results of polysomnography/ neurophysiology testing and pre-existing records as far as provided in visit., my assessment is   1) Mrs. Shnayder reports that her husband may jerk or a properly movements in his sleep, she has also noted him to sometimes to yell , thrash, kick up and box . Melatonin had been tried but did not work well for Mr. R., who also remains significantly clinically depressed and has a  pseudobulbar affect . I would recommend Klonopin at low dose and advised the family that it is not to be taking more than a quarter milligram at night. I will also advise him for a sleep study to see if there is any additional sleep disordered breathing especially since he has a history of pulmonary embolism and may have hypoxemia. He has a remote history of smoking.   Plan:  Treatment plan and additional workup :  I will restrict the workup to sleep related issues as he has a primary neurologist for his dementia and parkisonism, pseudobulbar affect.  [name], the patient's primary care physician has meanwhile prescribed temazepam at 7.5 mg daily at bedtime which was felt to be safe in combination with a low-dose hydrocortisone acetaminophen at night. I had offered the patient clonazepam which she has been taken in the morning as an anxiolytic. I have not found a significant amount of apnea except when the patient sleeps supine and therefore recommend supine sleep to be avoided. He does have a fit frequent periodic limb movements and some myoclonic jerks and these fit  his underlying diagnosis of a vascular parkinsonism,   He can be followed by his primary neurologist and by his primary care physician.    No RV  Asencion Partridge Tamakia Porto MD  06/24/2016   CC: [name], [place], [place] [id number]

## 2016-07-19 DIAGNOSIS — F329 Major depressive disorder, single episode, unspecified: Secondary | ICD-10-CM | POA: Diagnosis not present

## 2016-07-19 DIAGNOSIS — M1990 Unspecified osteoarthritis, unspecified site: Secondary | ICD-10-CM | POA: Diagnosis not present

## 2016-07-19 DIAGNOSIS — G20 Parkinson's disease: Secondary | ICD-10-CM | POA: Diagnosis not present

## 2016-07-19 DIAGNOSIS — Z6830 Body mass index (BMI) 30.0-30.9, adult: Secondary | ICD-10-CM | POA: Diagnosis not present

## 2016-07-19 NOTE — Progress Notes (Signed)
Christian Wiley was seen today in the movement disorders clinic for neurologic consultation at the request of [name],[name], MD.  The consultation is for the evaluation of gait change and to r/o PD.  This patient is accompanied in the office by his spouse who supplements the history.   Pt states that he has been seen by his chiropractor, [name], for LBP and she recommended he get evaluated by a neurologist as she thought he had parkinsonian features.    09/19/15 update:  Pt returns for f/u accompanied by his wife who supplements the hx.  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  We decided to give him a trial of levodopa.  Today, the patient states that he is only taking it bid instead of the prescribed tid.  He is usually taking it at 10am and 5pm.  Tremor is improved.   He did have an MRI of the brain without gadolinium on 06/28/2015.  There was no significant change compared to his prior MRI of the brain in 2007.  There is evidence of mild small vessel disease.  PT was refused last visit.  He is c/o fatigue.  Has taken his carbidopa/levodopa 25/100 this AM but has not taken his bystolic.  12/21/15 update:  The patient is following up today, accompanied by his wife who supplements the history.  The patient has history of vascular parkinsonism.  Last visit, he was only taking his levodopa twice per day, and I encouraged him to take it 3 times per day as directed.  His wife states that he still doesn't get his midday dose in time.  He was supposed to be taking the medication at 10am (wake up time)/2 pm/6pm.  He has not wanted to attend physical therapy, but continues to exercise at the gym.  He has not had any falls, but does admit that he "slid" out of the bed trying to get out (got new higher mattress).  No hallucinations.  He has had episodes of confusion and will sometimes ask his wife, "who will keep the kids."  He stopped xanax about a week ago and confusion seems  better.  He takes 2 hydrocodone at bedtime for sleep.  That hasn't helped sleep but he is resistant to giving up the medication. No lightheadedness or near syncope.  03/21/16 update:  The patient follows up today, accompanied by his wife who supplements the history.  The patient has a history of vascular parkinsonism.  Last visit, I again encouraged the patient to take his carbidopa/levodopa 3 times per day as opposed to the twice per day that he was previously taking it.  He did set an alarm but is still inconsistent with that.   He did attend physical therapy since our last visit, and I congratulated him on that.  Last visit, we talked about memory loss and I thought that medications contribute, particularly the hydrocodone and I encouraged him to decrease that to 1 tablet per night and add melatonin for sleep.  He did decrease the hydrocodone.  The melatonin didn't help him sleep.  Wife states that memory has been stable but still has trouble with telling the day.  That is not new.  07/23/16 update:  The patient follows up today, accompanied by his wife who supplements the history.  He has a history of vascular parkinsonism and he is supposed to be on carbidopa/levodopa 25/100, one tablet 3 times per day .  He almost always misses the middle  of the day dose of levodopa despite an alarm.  He denies any falls since last visit.  Has dizziness with standing but no near syncope.  Only drinking 8oz of water per day.  No hallucinations.  He does have history of memory loss, and likely vascular dementia.  He has refused any medication for this.  Memory loss is likely exacerbated by medication such as hydrocodone.  He saw [name] since our last visit and had a nocturnal polysomnogram on 06/04/2016.  His apnea-hypopnea index was 8.1.  She did prescribe clonazepam for periodic limb movements, which she felt was consistent with his diagnosis of vascular parkinsonism.  Wife states that Friday pt saw the NP at PCP  office for crying spells and she put him on neudexta.  Only been on it for 3 days.  He is still on lexapro from [name].  Pt cannot state if crying spells associated with sadness but wife thinks that they are associated with sadness.  Neuroimaging has previously been performed.  It is not available for my review today.  States that he had a CT brain a few years ago after a fall.      ALLERGIES:   Allergies  Allergen Reactions  . Ambien [Zolpidem Tartrate]     Goes wild, gets anxious and confused with [place]    CURRENT MEDICATIONS:  Outpatient Encounter Prescriptions as of 07/23/2016  Medication Sig  . allopurinol (ZYLOPRIM) 300 MG tablet Take 300 mg by mouth every morning.   Marland Kitchen atorvastatin (LIPITOR) 40 MG tablet Take 1 tablet by mouth at bedtime.   . carbidopa-levodopa (SINEMET IR) 25-100 MG tablet TAKE 1 TABLET BY MOUTH 3 (THREE) TIMES DAILY.  . clonazepam (KLONOPIN) 0.125 MG disintegrating tablet Take 1 tablet (0.125 mg total) by mouth at bedtime. (Patient taking differently: Take 0.25 mg by mouth at bedtime. )  . Dextromethorphan-Quinidine (NUEDEXTA) 20-10 MG CAPS Take by mouth.  . escitalopram (LEXAPRO) 10 MG tablet Take 10 mg by mouth daily.  Marland Kitchen HYDROcodone-acetaminophen (NORCO/VICODIN) 5-325 MG tablet TAKE 2 TABLETs EVERY 4 HOURS AS NEEDED FOR PAIN  . levothyroxine (SYNTHROID, LEVOTHROID) 25 MCG tablet Take 25 mcg by mouth daily.  . meclizine (ANTIVERT) 25 MG tablet Take 25 mg by mouth 2 (two) times daily as needed for dizziness.   . nebivolol (BYSTOLIC) 10 MG tablet Take 10 mg by mouth every morning.  . Rivaroxaban (XARELTO) 20 MG TABS tablet Take 1 tablet (20 mg total) by mouth daily with supper.  . temazepam (RESTORIL) 7.5 MG capsule   . amLODipine (NORVASC) 5 MG tablet Take 2.5 mg by mouth daily.  Marland Kitchen amoxicillin (AMOXIL) 500 MG capsule Take 500 mg by mouth as needed. For dental work  . [DISCONTINUED] ALPRAZolam (XANAX) 0.5 MG tablet Take 0.5 mg by mouth at bedtime.  .  [DISCONTINUED] DULoxetine (CYMBALTA) 30 MG capsule Take 30 mg by mouth daily.   No facility-administered encounter medications on file as of 07/23/2016.     PAST MEDICAL HISTORY:   Past Medical History:  Diagnosis Date  . AAA (abdominal aortic aneurysm) ([place])   . Arthritis   . Cancer (HCC)    hx of skin cancer   . GERD (gastroesophageal reflux disease)   . Hx of hiatal hernia   . Hyperlipidemia   . Hypertension   . Nocturia   . PE (pulmonary embolism)   . Pulmonary embolism ([place]) 2011 and 2014   . Sigmoid diverticulosis     PAST SURGICAL HISTORY:   Past Surgical  History:  Procedure Laterality Date  . ABDOMINAL AORTIC ANEURYSM REPAIR  2001   endovascular repair  . BACK SURGERY     laminectomy  . BACK SURGERY    . CATARACT EXTRACTION, BILATERAL    . CHOLECYSTECTOMY    . EXCISION/RELEASE BURSA HIP Right 07/15/2014   Procedure: RIGHT HIP BURSECTOMY WITH TENDON REPAIR ;  Surgeon: Gearlean Alf, MD;  Location: [facility];  Service: Orthopedics;  Laterality: Right;  . JOINT REPLACEMENT     bilateral knee replacement  . ROTATOR CUFF REPAIR     bilateral    SOCIAL HISTORY:   Social History   Social History  . Marital status: Married    Spouse name: N/A  . Number of children: N/A  . Years of education: N/A   Occupational History  . Retired     [employer], parts dept   Social History Main Topics  . Smoking status: Former Smoker    Quit date: 10/01/1974  . Smokeless tobacco: Never Used  . Alcohol use No  . Drug use: No  . Sexual activity: Not Currently   Other Topics Concern  . Not on file   Social History Narrative   Married   2 daughters, 1 son    FAMILY HISTORY:   Family Status  Relation Status  . Mother Deceased   alzheimer dementia  . Father Deceased   CAD  . Brother Alive   healthy  . Sister Deceased   breast CA  . Sister Alive   DJD  . Child Alive   3, healthy    ROS:  A complete 10 system review of systems was obtained and was  unremarkable apart from what is mentioned above.  PHYSICAL EXAMINATION:    VITALS:   Vitals:   07/23/16 0839  BP: 108/62  Pulse: (!) 55  Weight: 225 lb (102.1 kg)  Height: 6' (1.829 m)    GEN:  The patient appears stated age and is in NAD. HEENT:  Normocephalic, atraumatic.  The mucous membranes are moist. The superficial temporal arteries are without ropiness or tenderness. CV:  Bradycardic.  Regular rhythm Lungs:  CTAB Neck/HEME:  There are no carotid bruits bilaterally.  Neurological examination:  Orientation:  Montreal Cognitive Assessment  03/21/2016  Visuospatial/ Executive (0/5) 1  Naming (0/3) 2  Attention: Read list of digits (0/2) 2  Attention: Read list of letters (0/1) 1  Attention: Serial 7 subtraction starting at 100 (0/3) 1  Language: Repeat phrase (0/2) 1  Language : Fluency (0/1) 1  Abstraction (0/2) 1  Delayed Recall (0/5) 1  Orientation (0/6) 3  Total 14  Adjusted Score (based on education) 15    Cranial nerves: There is good facial symmetry. Pupils are equal round and reactive to light bilaterally. Fundoscopic exam reveals clear margins bilaterally. Extraocular muscles are intact. The visual fields are full to confrontational testing. The speech is fluent and clear. Soft palate rises symmetrically and there is no tongue deviation. Hearing is intact to conversational tone. Sensation: Sensation is intact to light touch throughout Motor: Strength is 5/5 in the bilateral upper and lower extremities.   Shoulder shrug is equal and symmetric.  There is no pronator drift.   Movement examination: Tone: There is normal tone in the bilateral upper extremities.  The tone in the lower extremities is normal.  Abnormal movements: There is no tremor today Coordination:  There is no decremation with RAM's Gait and Station: The patient is mildly short stepped with decreased arm  swing on the left.    ASSESSMENT/PLAN:  1.  Parkinsonism  -I suspect that this  represents vascular parkinsonism and not idiopathic Parkinson's disease.  Has set an alarm for taking carbidopa/levodopa 25/100 at 10 AM/2 PM/6 PM but still only takes 2 per day.  -needs to increase water intake greatly to help the lightheaded.    -We did talk about safe, cardiovascular exercise.  The patient does go to the gym and rides the bike.  Encouraged him to continue this and to push his exercise safely.  2.  Probable dementia  -MoCA 15 on 07/23/2016.  Doesn't want to add more medication.  Needs more physical exercise and discussed 3.  Periodic limb movement disorder and very mild sleep apnea  -The patient saw [name] and had a sleep study on 06/04/2016.  AHI was only 8.1 and CPAP was not recommended.  The patient should sleep in the side-lying position.  She did recommend low-dose clonazepam at night. 4.  LBP  -The patient has seen Dr. Carloyn Manner and Dr. Nelva Bush along with chiropractics.  . While I did see he had an MRI of his lumbar spine in 2014 that demonstrated moderate to severe neural foraminal stenosis at L4-L5 and significant degenerative changes, they are not interested in further surgery or injections and have failed alternative (chiropractic) tx.  Sports med (Dr. Tamala Julian) may be of value but will leave that to discretion of PCP. 5.  Depression  -on Lexapro, 10 mg daily  -PA at PCP office just placed on neudexta and told them that can take about 2 weeks to know if will work.  If doesn't help within that time, may not.  Not sure that his sx's are not all just depression and not PBA but should know soon if this helps.  If stays on this, EKG should be checked since and lexapro can prolong QT interval.  If not, then I told the patient/wife lexapro may need increased/changed and will talk with prescribing physician about this. 6.  Lightheadedness  -BP is low.  Asked patient to follow up with PCP regarding this.  May need to cut down on bystolic.  He hasn't even taken his medication yet today.  We  will call PCP and let him know vitals today 7.  Follow up is anticipated in the next 4 months, sooner should new neurologic issues arise.  Much greater than 50% of this visit was spent in counseling with the patient and the wife.  Total face to face time:  30 min

## 2016-07-23 ENCOUNTER — Encounter: Payer: Self-pay | Admitting: Neurology

## 2016-07-23 ENCOUNTER — Ambulatory Visit (INDEPENDENT_AMBULATORY_CARE_PROVIDER_SITE_OTHER): Payer: PPO | Admitting: Neurology

## 2016-07-23 VITALS — BP 108/62 | HR 55 | Ht 72.0 in | Wt 225.0 lb

## 2016-07-23 DIAGNOSIS — F482 Pseudobulbar affect: Secondary | ICD-10-CM

## 2016-07-23 DIAGNOSIS — F0151 Vascular dementia with behavioral disturbance: Secondary | ICD-10-CM | POA: Diagnosis not present

## 2016-07-23 DIAGNOSIS — R42 Dizziness and giddiness: Secondary | ICD-10-CM

## 2016-08-06 ENCOUNTER — Encounter: Payer: Self-pay | Admitting: Family

## 2016-08-14 ENCOUNTER — Ambulatory Visit (HOSPITAL_COMMUNITY)
Admission: RE | Admit: 2016-08-14 | Discharge: 2016-08-14 | Disposition: A | Discharge status: Home / Self Care | Payer: PPO | Source: Ambulatory Visit | Attending: Family | Admitting: Family

## 2016-08-14 ENCOUNTER — Ambulatory Visit (INDEPENDENT_AMBULATORY_CARE_PROVIDER_SITE_OTHER): Payer: PPO | Admitting: Family

## 2016-08-14 ENCOUNTER — Encounter: Payer: Self-pay | Admitting: Family

## 2016-08-14 VITALS — BP 132/75 | HR 51 | Ht 72.0 in | Wt 227.6 lb

## 2016-08-14 DIAGNOSIS — I714 Abdominal aortic aneurysm, without rupture, unspecified: Secondary | ICD-10-CM

## 2016-08-14 DIAGNOSIS — Z48812 Encounter for surgical aftercare following surgery on the circulatory system: Secondary | ICD-10-CM

## 2016-08-14 DIAGNOSIS — Z95828 Presence of other vascular implants and grafts: Secondary | ICD-10-CM | POA: Diagnosis not present

## 2016-08-14 NOTE — Progress Notes (Signed)
[facility] [place]  CC: Follow up s/p EVAR  History of Present Illness  Christian Wiley is a 81 y.o. (09-13-1935) male patient of Dr. Scot Dock who is s/p endovascular aneurysm repair in 2001 for a 4.8 cm infrarenal abdominal aortic aneurysm. At the time of his 2014 visit the aneurysm was stable in size at 4.8 cm and he was scheduled for an 18 month follow up visit. He comes in for that ultrasound today.  He does continue to have some chronic low back pain which is not new. He denies any significant abdominal pain. He describes some claudication in the right thigh which is brought on by ambulation relieved with rest. He has no symptoms on the left side. He denies any history of rest pain or history of nonhealing ulcers.  He denies any known history of stroke or TIA.  He takes Xarelto for hx of PE.  Pt Diabetic: No Pt smoker: former smoker, quit about 1975, started at age 71   Past Medical History:  Diagnosis Date  . AAA (abdominal aortic aneurysm) ([place])   . Arthritis   . Cancer (HCC)    hx of skin cancer   . GERD (gastroesophageal reflux disease)   . Hx of hiatal hernia   . Hyperlipidemia   . Hypertension   . Nocturia   . PE (pulmonary embolism)   . Pulmonary embolism ([place]) 2011 and 2014   . Sigmoid diverticulosis    Past Surgical History:  Procedure Laterality Date  . ABDOMINAL AORTIC ANEURYSM REPAIR  2001   endovascular repair  . BACK SURGERY     laminectomy  . BACK SURGERY    . CATARACT EXTRACTION, BILATERAL    . CHOLECYSTECTOMY    . EXCISION/RELEASE BURSA HIP Right 07/15/2014   Procedure: RIGHT HIP BURSECTOMY WITH TENDON REPAIR ;  Surgeon: Gearlean Alf, MD;  Location: [facility];  Service: Orthopedics;  Laterality: Right;  . JOINT REPLACEMENT     bilateral knee replacement  . ROTATOR CUFF REPAIR     bilateral   Social History Social History  Substance Use Topics  . Smoking status: Former Smoker    Quit date: 10/01/1974  . Smokeless tobacco:  Never Used  . Alcohol use No   Family History Family History  Problem Relation Age of Onset  . Hyperlipidemia Mother   . Hypertension Mother   . Hyperlipidemia Father   . Hypertension Father   . Heart disease Father   . Heart attack Father   . Cancer Sister   . Hyperlipidemia Brother   . Hypertension Daughter    Current Outpatient Prescriptions on File Prior to Visit  Medication Sig Dispense Refill  . allopurinol (ZYLOPRIM) 300 MG tablet Take 300 mg by mouth every morning.     Marland Kitchen amoxicillin (AMOXIL) 500 MG capsule Take 500 mg by mouth as needed. For dental work    . atorvastatin (LIPITOR) 40 MG tablet Take 1 tablet by mouth at bedtime.     . carbidopa-levodopa (SINEMET IR) 25-100 MG tablet TAKE 1 TABLET BY MOUTH 3 (THREE) TIMES DAILY. 90 tablet 5  . clonazepam (KLONOPIN) 0.125 MG disintegrating tablet Take 1 tablet (0.125 mg total) by mouth at bedtime. (Patient taking differently: Take 0.25 mg by mouth at bedtime. ) 60 tablet 0  . escitalopram (LEXAPRO) 10 MG tablet Take 10 mg by mouth daily.  1  . HYDROcodone-acetaminophen (NORCO/VICODIN) 5-325 MG tablet TAKE 2 TABLETs EVERY 4 HOURS AS NEEDED FOR PAIN  0  .  levothyroxine (SYNTHROID, LEVOTHROID) 25 MCG tablet Take 25 mcg by mouth daily.  6  . meclizine (ANTIVERT) 25 MG tablet Take 25 mg by mouth 2 (two) times daily as needed for dizziness.     . nebivolol (BYSTOLIC) 10 MG tablet Take 10 mg by mouth every morning.    . Rivaroxaban (XARELTO) 20 MG TABS tablet Take 1 tablet (20 mg total) by mouth daily with supper. 30 tablet 3  . temazepam (RESTORIL) 7.5 MG capsule     . amLODipine (NORVASC) 5 MG tablet Take 2.5 mg by mouth daily.  5  . Dextromethorphan-Quinidine (NUEDEXTA) 20-10 MG CAPS Take by mouth.     No current facility-administered medications on file prior to visit.    Allergies  Allergen Reactions  . Ambien [Zolpidem Tartrate]     Goes wild, gets anxious and confused with ambien     ROS: See HPI for pertinent positives  and negatives.  Physical Examination  Vitals:   08/14/16 0912  BP: 132/75  Pulse: (!) 51  SpO2: 97%  Weight: 227 lb 9.6 oz (103.2 kg)  Height: 6' (1.829 m)   Body mass index is 30.87 kg/m.  General: A&O x 3, WD, obese male  Pulmonary: Sym exp, respirations are non labored, good air movt, CTAB, no rales, rhonchi, or wheezing   Cardiac: RRR, Nl S1, S2, no murmur appreciated  Vascular: Vessel Right Left  Radial 2+Palpable 2+Palpable  Carotid  without bruit  without bruit  Aorta Not palpable N/A  Femoral 1+Palpable 2+Palpable  Popliteal Not palpable Not palpable  PT notPalpable notPalpable  DP 2+Palpable 2+Palpable   Gastrointestinal: soft, NTND, -G/R, - HSM, - palpable masses, - CVAT B.  Musculoskeletal: M/S 5/5 throughout, extremities without ischemic changes.  Neurologic: Pain and light touch intact in extremities, Motor exam as listed above  Non-Invasive Vascular Imaging  EVAR Duplex (Date: 08/14/16)  AAA sac size: 4.5 cm x 4.72 cm, normal diameters of iliac arteries  no endoleak detected 02/08/15: 4.7 cm x 4.8 cm   Medical Decision Making  Christian Wiley is a 81 y.o. male who presents s/p EVAR (Date: 2001).  Pt is asymptomatic with stable sac size. He takes Xarelto for hx of PE.  I discussed with the patient the importance of surveillance of the endograft.  The next endograft duplex will be scheduled for 18 months.  The patient will follow up with [place] in 18 months with these studies.  I emphasized the importance of maximal medical management including strict control of blood pressure, blood glucose, and lipid levels, antiplatelet agents, obtaining regular exercise, and cessation of smoking.   Thank you for allowing [place] to participate in this patient's care.  Clemon Chambers, RN, MSN, FNP-C [facility] [place] Office: [phone]  Clinic Physician: Oneida Alar on call  08/14/2016, 9:18 AM

## 2016-08-22 DIAGNOSIS — R001 Bradycardia, unspecified: Secondary | ICD-10-CM | POA: Diagnosis not present

## 2016-08-22 DIAGNOSIS — G4730 Sleep apnea, unspecified: Secondary | ICD-10-CM | POA: Diagnosis not present

## 2016-08-22 DIAGNOSIS — R5383 Other fatigue: Secondary | ICD-10-CM | POA: Diagnosis not present

## 2016-08-22 DIAGNOSIS — R7302 Impaired glucose tolerance (oral): Secondary | ICD-10-CM | POA: Diagnosis not present

## 2016-08-22 DIAGNOSIS — F1320 Sedative, hypnotic or anxiolytic dependence, uncomplicated: Secondary | ICD-10-CM | POA: Diagnosis not present

## 2016-08-22 DIAGNOSIS — G629 Polyneuropathy, unspecified: Secondary | ICD-10-CM | POA: Diagnosis not present

## 2016-08-22 DIAGNOSIS — G20 Parkinson's disease: Secondary | ICD-10-CM | POA: Diagnosis not present

## 2016-08-22 DIAGNOSIS — E038 Other specified hypothyroidism: Secondary | ICD-10-CM | POA: Diagnosis not present

## 2016-08-22 DIAGNOSIS — Z6830 Body mass index (BMI) 30.0-30.9, adult: Secondary | ICD-10-CM | POA: Diagnosis not present

## 2016-08-22 DIAGNOSIS — M4726 Other spondylosis with radiculopathy, lumbar region: Secondary | ICD-10-CM | POA: Diagnosis not present

## 2016-08-22 DIAGNOSIS — Z23 Encounter for immunization: Secondary | ICD-10-CM | POA: Diagnosis not present

## 2016-08-30 DIAGNOSIS — F329 Major depressive disorder, single episode, unspecified: Secondary | ICD-10-CM | POA: Diagnosis not present

## 2016-09-04 DIAGNOSIS — R310 Gross hematuria: Secondary | ICD-10-CM | POA: Diagnosis not present

## 2016-09-04 DIAGNOSIS — G20 Parkinson's disease: Secondary | ICD-10-CM | POA: Diagnosis not present

## 2016-09-04 DIAGNOSIS — Z6830 Body mass index (BMI) 30.0-30.9, adult: Secondary | ICD-10-CM | POA: Diagnosis not present

## 2016-09-04 DIAGNOSIS — Z7901 Long term (current) use of anticoagulants: Secondary | ICD-10-CM | POA: Diagnosis not present

## 2016-09-04 DIAGNOSIS — N390 Urinary tract infection, site not specified: Secondary | ICD-10-CM | POA: Diagnosis not present

## 2016-09-11 ENCOUNTER — Ambulatory Visit: Payer: PPO | Attending: Neurology | Admitting: Rehabilitative and Restorative Service Providers"

## 2016-09-11 DIAGNOSIS — R293 Abnormal posture: Secondary | ICD-10-CM | POA: Insufficient documentation

## 2016-09-11 DIAGNOSIS — M6281 Muscle weakness (generalized): Secondary | ICD-10-CM | POA: Insufficient documentation

## 2016-09-11 DIAGNOSIS — R2681 Unsteadiness on feet: Secondary | ICD-10-CM | POA: Insufficient documentation

## 2016-09-11 DIAGNOSIS — R2689 Other abnormalities of gait and mobility: Secondary | ICD-10-CM | POA: Diagnosis not present

## 2016-09-12 ENCOUNTER — Telehealth: Payer: Self-pay | Admitting: Neurology

## 2016-09-12 DIAGNOSIS — G20 Parkinson's disease: Secondary | ICD-10-CM

## 2016-09-12 NOTE — Therapy (Signed)
[facility] [address] [place], [place], [address] Phone: [phone]   Fax:  [phone]  Physical Therapy Evaluation  Patient Details  Name: Christian Wiley MRN: [id number] Date of Birth: 09/05/1935 Referring Provider: Alonza Bogus, DO  Encounter Date: 09/11/2016      PT End of Session - 09/11/16 1041    Visit Number 1   Number of Visits 16   Date for PT Re-Evaluation 11/10/16   Authorization Type G code every 10th visit   PT Start Time 0945   PT Stop Time 1035   PT Time Calculation (min) 50 min   Equipment Utilized During Treatment Gait belt   Activity Tolerance Patient tolerated treatment well   Behavior During Therapy [facility] for tasks assessed/performed      Past Medical History:  Diagnosis Date  . AAA (abdominal aortic aneurysm) ([place])   . Arthritis   . Cancer (HCC)    hx of skin cancer   . GERD (gastroesophageal reflux disease)   . Hx of hiatal hernia   . Hyperlipidemia   . Hypertension   . Nocturia   . PE (pulmonary embolism)   . Pulmonary embolism ([place]) 2011 and 2014   . Sigmoid diverticulosis     Past Surgical History:  Procedure Laterality Date  . ABDOMINAL AORTIC ANEURYSM REPAIR  2001   endovascular repair  . BACK SURGERY     laminectomy  . BACK SURGERY    . CATARACT EXTRACTION, BILATERAL    . CHOLECYSTECTOMY    . EXCISION/RELEASE BURSA HIP Right 07/15/2014   Procedure: RIGHT HIP BURSECTOMY WITH TENDON REPAIR ;  Surgeon: Gearlean Alf, MD;  Location: [facility];  Service: Orthopedics;  Laterality: Right;  . JOINT REPLACEMENT     bilateral knee replacement  . ROTATOR CUFF REPAIR     bilateral    There were no vitals filed for this visit.       Subjective Assessment - 09/11/16 0948    Subjective The patient reports that he has not been doing the exercises and has not had any falls since completing therapy in April 2017.  "I've stumbled a few times, but no actual fall."     Patient is  accompained by: Family member  spouse, Christian Wiley   Pertinent History h/o 2 back surgeries - has been told "you are going to have to live with it."   Patient Stated Goals "I don't have any goals."  Reports that he can't do things he once could (cutting the grass, unable to walk long distances).  His spouse reports walking short and long distances are the biggest problem.  Unable to walk into [employer] without stopping multiple times.    Currently in Pain? Yes   Pain Score 6    Pain Location Back   Pain Orientation Lower   Pain Descriptors / Indicators Aching;Dull   Pain Type Chronic pain   Pain Onset More than a month ago   Pain Frequency Constant   Aggravating Factors  Worse with rotation   Pain Relieving Factors Medications            [facility] PT Assessment - 09/11/16 1007      Assessment   Medical Diagnosis Parkinson's Disease   Referring Provider Wells Guiles Tat, DO   Onset Date/Surgical Date --  Diagnosed Summer 2016   Prior Therapy known to OP PT from 01/2016-03/2016.  PT recommended 6 month evaluation at [facility].     Precautions   Precautions Fall  Restrictions   Weight Bearing Restrictions No     Balance Screen   Has the patient fallen in the past 6 months No   Has the patient had a decrease in activity level because of a fear of falling?  Yes   Is the patient reluctant to leave their home because of a fear of falling?  Yes     [place] Private residence   Living Arrangements Spouse/significant other   Type of [place] to enter   Entrance Stairs-Number of Steps 2   Entrance Stairs-Rails Right   [place] One level   [place] None     Prior Function   Level of Independence Independent with household mobility without device     Cognition   Overall Cognitive Status Within Functional Limits for tasks assessed     Sensation   Light Touch Appears Intact     Posture/Postural Control   Posture/Postural Control  Postural limitations   Postural Limitations Rounded Shoulders;Forward head;Decreased lumbar lordosis     ROM / Strength   AROM / PROM / Strength AROM;Strength     AROM   Overall AROM  Within functional limits for tasks performed  general tightness noted in hamstrings and hip flexors     Strength   Overall Strength --  to be further assessed     Flexibility   Soft Tissue Assessment /Muscle Length --  tightness noted in hamstrings L > R     Bed Mobility   Bed Mobility Right Sidelying to Sit;Left Sidelying to Sit;Sit to Supine   Right Sidelying to Sit 6: Modified independent (Device/Increase time)   Left Sidelying to Sit 4: Min assist  Needs hand to pull up from left side   Left Sidelying to Sit Details (indicate cue type and reason) gets in/out of bed at home from right, therefore more challenging from left   Sit to Supine 4: Min assist  when pushing up from left     Transfers   Transfers Sit to Stand   Sit to Stand 6: Modified independent (Device/Increase time)  slowed pace   Five time sit to stand comments  17.37 seconds     Ambulation/Gait   Ambulation/Gait Yes   Ambulation/Gait Assistance 6: Modified independent (Device/Increase time)  slowed pace   Ambulation Distance (Feet) 300 Feet   Assistive device None   Gait Pattern Decreased stride length;Decreased dorsiflexion - right;Decreased dorsiflexion - left;Shuffle;Poor foot clearance - left;Poor foot clearance - right;Narrow base of support;Decreased arm swing - right;Decreased arm swing - left;Step-through pattern;Decreased trunk rotation   Ambulation Surface Level   Gait velocity 2.0 ft/sec   Stairs Yes   Stairs Assistance 5: Supervision   Stair Management Technique Two rails;Alternating pattern   Number of Stairs 4   Gait Comments Patient c/o increasing pain in low back with ambulation     Standardized Balance Assessment   Standardized Balance Assessment Berg Balance Test;Timed Up and Go Test     Berg Balance  Test   Sit to Stand Able to stand  independently using hands   Standing Unsupported Able to stand safely 2 minutes   Sitting with Back Unsupported but Feet Supported on Floor or Stool Able to sit safely and securely 2 minutes   Stand to Sit Sits safely with minimal use of hands   Transfers Able to transfer safely, definite need of hands   Standing Unsupported with Eyes Closed Able to stand 10  seconds safely   Standing Ubsupported with Feet Together Able to place feet together independently and stand 1 minute safely   From Standing, Reach Forward with Outstretched Arm Can reach confidently >25 cm (10")   From Standing Position, Pick up Object from Floor Able to pick up shoe, needs supervision   From Standing Position, Turn to Look Behind Over each Shoulder Turn sideways only but maintains balance   Turn 360 Degrees Able to turn 360 degrees safely but slowly   Standing Unsupported, Alternately Place Feet on Step/Stool Able to complete >2 steps/needs minimal assist   Standing Unsupported, One Foot in Front Able to take small step independently and hold 30 seconds   Standing on One Leg Tries to lift leg/unable to hold 3 seconds but remains standing independently   Total Score 41   Berg comment: 41/56 indicating high fall risk     Timed Up and Go Test   TUG --  14.0 seconds indicating high fall risk            Vestibular Assessment - 09/11/16 1021      Vestibular Assessment   General Observation Patient reporting he has c/o dizziness and no one has checked into it.     Symptom Behavior   Type of Dizziness Imbalance  and lightheadedness   Frequency of Dizziness daily   Duration of Dizziness seconds to minutes   Aggravating Factors --  transitioning to standing and rolling to right   Relieving Factors Lying supine;Head stationary     Occulomotor Exam   Occulomotor Alignment Abnormal  L eye hypertropia    Smooth Pursuits Intact   Saccades Intact     Vestibulo-Occular Reflex    Comment Head impulse test positive for refixation saccade to the right and eyes came off target to the left and patient did not refixate to target (slowed movement overall)     Positional Testing   Sidelying Test Sidelying Right;Sidelying Left   Horizontal Canal Testing Horizontal Canal Right;Horizontal Canal Left     Sidelying Right   Sidelying Right Duration none   Sidelying Right Symptoms No nystagmus     Sidelying Left   Sidelying Left Duration none   Sidelying Left Symptoms No nystagmus     Horizontal Canal Right   Horizontal Canal Right Duration none   Horizontal Canal Right Symptoms Normal     Horizontal Canal Left   Horizontal Canal Left Duration none   Horizontal Canal Left Symptoms Normal                       PT Education - 09/11/16 1041    Education provided Yes   Education Details PT educated patient on plan for therapy and goals.   Person(s) Educated Patient;Spouse   Methods Explanation   Comprehension Verbalized understanding          PT Short Term Goals - 09/11/16 1051      PT SHORT TERM GOAL #1   Title The patient will perform HEP with family's supervision including balance, mobility, low back/core stabilization, and gaze training.   Baseline Target date 10/12/2016   Time 4   Period Weeks     PT SHORT TERM GOAL #2   Title Patient will improve Berg balance score from 41/56 to > or equal to 45/56 to demo dec'ing risk for falls.   Baseline Target date 10/12/2016   Time 4   Period Weeks     PT SHORT TERM GOAL #3  Title Patient will improve gait speed from 2.0 ft/sec to > or equa lto 2.4 ft/sec to demo improving functional gait.   Baseline Target date 10/12/2016   Time 4   Period Weeks     PT SHORT TERM GOAL #4   Title The patient will report back pain dec'd from 6/10 baseline to < or equal to 4/10.   Baseline Target date 10/12/2016   Time 4   Period Weeks     PT SHORT TERM GOAL #5   Title The patient will reduce 5 times sit<>stand  from 17.37 seconds to < or equal to 15 seconds to demo improving functional mobility.   Baseline Target date 10/12/2016   Time 4   Period Weeks           PT Long Term Goals - 09/12/16 1009      PT LONG TERM GOAL #1   Title Patient and family will verbalize understanding of community resources for parkinson's disease.   Baseline Target date 11/11/2016   Time 8   Period Weeks     PT LONG TERM GOAL #2   Title Patient will improve gait speed from 2 ft/sec to > or equal to 2.7 ft/sec to demo transition to "full community ambulator" classification of gait.   Baseline Target date 11/11/2016   Time 8   Period Weeks     PT LONG TERM GOAL #3   Title The patient will improve TUG from 14 seconds to < or equal to 13 seconds to demo dec'd fall risk.   Baseline Target date 11/11/2016   Time 8   Period Weeks     PT LONG TERM GOAL #4   Title Pt will ambulate at least 1000 ft indoor and outdoor surfaces, with supervision, device as needed, for improved community gait.   Baseline Target date 11/11/2016   Time 8   Period Weeks     PT LONG TERM GOAL #5   Title Patient will improve Berg from 41/56 to > or equal to 50/56 to demo improving functional balance.   Baseline Target date 11/11/2016   Time 8   Period Weeks               Plan - 09/12/16 1012    Clinical Impression Statement The patient is a 81 year old known to our clinic from prior PT with recommendation to be evaluated 6 months after d/c.  At today's visit he presents with dec'd balance, gait instability, low back pain, dec'd speed during transitional movements, and diminished VOR.  PT discussed carryover to home as patient has not performed activities previously recommended.  Patient willing to participate in minimal HEP and walking.  His wife is interested in post d/c parkinson's classes to improve compliance.  PT to focus on back pain and dizziness as these are 2 significant concerns for patient that may hinder his participation  in parkinson's exercises.     Rehab Potential Good   PT Frequency 2x / week   PT Duration 8 weeks   PT Treatment/Interventions ADLs/Self Care Home Management;Gait training;Functional mobility training;Patient/family education;Therapeutic activities;Therapeutic exercise;Balance training;Neuromuscular re-education;Vestibular   PT Next Visit Plan Issue HEP for low back (bridges, supine marching), hamstring stretching, trunk rotation, VOR x 1; begin power exercises in PT and transition to home as low back allows.   Consulted and Agree with Plan of Care Patient;Family member/caregiver   Family Member Consulted spouse, Christian Wiley      Patient will benefit from skilled therapeutic intervention  in order to improve the following deficits and impairments:  Abnormal gait, Decreased balance, Postural dysfunction, Impaired flexibility, Difficulty walking, Dizziness  Visit Diagnosis: Other abnormalities of gait and mobility  Postural instability  Unsteadiness on feet      G-Codes - 2016/09/16 1018    Functional Assessment Tool Used Berg=41/56   Functional Limitation Mobility: Walking and moving around   Mobility: Walking and Moving Around Current Status [phone]) At least 20 percent but less than 40 percent impaired, limited or restricted   Mobility: Walking and Moving Around Goal Status [phone]) At least 1 percent but less than 20 percent impaired, limited or restricted       Problem List Patient Active Problem List   Diagnosis Date Noted  . PBA (pseudobulbar affect) 06/24/2016  . Vascular dementia 06/24/2016  . Greater trochanteric bursitis of right hip 07/15/2014  . Bursitis of hip 07/15/2014  . Pulmonary embolism ([place]) 10/01/2013    Class: Acute  . Thrombocytopenia ([place]) 10/01/2013    Class: Acute  . Hypertension 10/01/2013    Class: Chronic  . Abdominal aneurysm without mention of rupture 01/15/2012    Keshara Kiger, PT 09/12/2016, 10:19 AM  [facility] [address] [place], [place], [address] Phone: [phone]   Fax:  [phone]  Name: Christian Wiley MRN: [id number] Date of Birth: 1935/03/06

## 2016-09-12 NOTE — Telephone Encounter (Signed)
-----   Message from Mervyn Gay, PT sent at 09/11/2016  4:37 PM EDT ----- Regarding: PT order Dr. Carles Collet and Luvenia Starch, Mr. Garbarini was d/c from PT on 03/12/2016 and Mady Haagensen, PT recommended a 6 month PT evaluation due to nature of deficits.  Can you please place an order in [facility] for PT evaluation for Parkinson's. Thank you, WEAVER,CHRISTINA, PT

## 2016-09-26 ENCOUNTER — Ambulatory Visit: Payer: PPO | Admitting: Physical Therapy

## 2016-09-26 DIAGNOSIS — R2689 Other abnormalities of gait and mobility: Secondary | ICD-10-CM | POA: Diagnosis not present

## 2016-09-26 DIAGNOSIS — R293 Abnormal posture: Secondary | ICD-10-CM

## 2016-09-26 NOTE — Patient Instructions (Addendum)
Hip Flexion - Supine    Lying on back, knees bent, feet on floor, bend hips, bringing knees toward trunk. Repeat _10__ times. Do __1-2 times per day.  Copyright  VHI. All rights reserved.  Bridging    Slowly raise buttocks from floor, keeping stomach tight.  Hold up for 3 seconds Repeat __10_ times per set. Do _1-2___ sets per day.  http://orth.exer.us/1096   Copyright  VHI. All rights reserved.  Trunk: Rotation    Lie on back on firm, flat surface with knees bent. Keep head and shoulders flat, slowly lower knees to floor. Hold _30___ seconds. Repeat __3__ times, alternating sides. Do __1-2__ sessions per day. CAUTION: Movement should be gentle, steady and slow.  This should be a gentle stretch, not painful.  Copyright  VHI. All rights reserved.  Hamstring: Towel Stretch (Supine)    Lie on back. Loop towel around left foot, hip and knee at 90. Straighten knee and pull foot toward body. Hold 30_ seconds. Relax. Repeat _3__ times each leg. Do  1-2__ times a day. Repeat with other leg.    Copyright  VHI. All rights reserved.  HIP: Hamstrings - Short Sitting    Rest leg on raised surface. Keep knee straight. Lift chest. Hold _30__ seconds. _3__ reps each leg, _1-2__ sets per day  Copyright  VHI. All rights reserved.  Gaze Stabilization: Sitting    Keeping eyes on target on wall 2-3 feet away, tilt head down 15-30 and move head side to side for __30__ seconds. Repeat while moving head up and down for _3-__ seconds. Do __2-3__ sessions per day. Keep object in focus-target should not be blurry.  Copyright  VHI. All rights reserved.

## 2016-09-26 NOTE — Therapy (Signed)
[facility] [address] [place], [place], [address] Phone: [phone]   Fax:  [phone]  Physical Therapy Treatment  Patient Details  Name: Christian Wiley MRN: [id number] Date of Birth: 20-Jun-1935 Referring Provider: Alonza Bogus, DO  Encounter Date: 09/26/2016      PT End of Session - 09/26/16 1947    Visit Number 2   Number of Visits 16   Date for PT Re-Evaluation 11/10/16   Authorization Type G code every 10th visit   PT Start Time 0852  Pt arrives late   PT Stop Time 0930   PT Time Calculation (min) 38 min   Activity Tolerance Patient tolerated treatment well   Behavior During Therapy [facility] for tasks assessed/performed      Past Medical History:  Diagnosis Date  . AAA (abdominal aortic aneurysm) ([place])   . Arthritis   . Cancer (HCC)    hx of skin cancer   . GERD (gastroesophageal reflux disease)   . Hx of hiatal hernia   . Hyperlipidemia   . Hypertension   . Nocturia   . PE (pulmonary embolism)   . Pulmonary embolism ([place]) 2011 and 2014   . Sigmoid diverticulosis     Past Surgical History:  Procedure Laterality Date  . ABDOMINAL AORTIC ANEURYSM REPAIR  2001   endovascular repair  . BACK SURGERY     laminectomy  . BACK SURGERY    . CATARACT EXTRACTION, BILATERAL    . CHOLECYSTECTOMY    . EXCISION/RELEASE BURSA HIP Right 07/15/2014   Procedure: RIGHT HIP BURSECTOMY WITH TENDON REPAIR ;  Surgeon: Gearlean Alf, MD;  Location[facility];  Service: Orthopedics;  Laterality: Right;  . JOINT REPLACEMENT     bilateral knee replacement  . ROTATOR CUFF REPAIR     bilateral    There were no vitals filed for this visit.      Subjective Assessment - 09/26/16 0856    Subjective No falls, no changes since eval   Pertinent History h/o 2 back surgeries - has been told "you are going to have to live with it."   Patient Stated Goals "I don't have any goals."  Reports that he can't do things he once  could (cutting the grass, unable to walk long distances).  His spouse reports walking short and long distances are the biggest problem.  Unable to walk into [employer] without stopping multiple times.    Currently in Pain? Yes   Pain Score 6    Pain Location Back   Pain Orientation Lower   Pain Descriptors / Indicators Aching;Dull   Pain Type Chronic pain   Pain Onset More than a month ago   Pain Frequency Constant   Aggravating Factors  worse in mornings   Pain Relieving Factors sitting in recliner                         [facility] Adult PT Treatment/Exercise - 09/26/16 0858      Ambulation/Gait   Ambulation/Gait Yes   Ambulation/Gait Assistance 6: Modified independent (Device/Increase time)   Ambulation Distance (Feet) 250 Feet   Assistive device None   Gait Pattern Decreased stride length;Decreased dorsiflexion - right;Decreased dorsiflexion - left;Shuffle;Poor foot clearance - left;Poor foot clearance - right;Narrow base of support;Decreased arm swing - right;Decreased arm swing - left;Step-through pattern;Decreased trunk rotation   Gait Comments Cues provided for upright posture with gait and for improved heelstrike/foot clearance  Exercises   Exercises Lumbar     Lumbar Exercises: Stretches   Active Hamstring Stretch 3 reps;30 seconds  each-seated and supine with towel   Lower Trunk Rotation 3 reps;30 seconds  preceded by rocking trunk rotation gently side to side     Lumbar Exercises: Supine   Bridge 10 reps;3 seconds   Other Supine Lumbar Exercises Hooklying marching x 10 reps each leg         Vestibular Treatment/Exercise - 09/26/16 0001      Vestibular Treatment/Exercise   Gaze Exercises X1 Viewing Horizontal;X1 Viewing Vertical     X1 Viewing Horizontal   Reps 2   Comments 30 seconds seated     X1 Viewing Vertical   Reps 2   Comments 30 seconds seated    Provided cues to patient with VOR exercises to keep target focused, not blurry, and that he  can slow head movements and head ranges to make sure the target is in focus.           PT Education - 09/26/16 1946    Education provided Yes   Education Details HEP initiated today-see instructions   Person(s) Educated Patient   Methods Explanation;Demonstration;Handout   Comprehension Verbalized understanding;Returned demonstration;Verbal cues required          PT Short Term Goals - 09/11/16 1051      PT SHORT TERM GOAL #1   Title The patient will perform HEP with family's supervision including balance, mobility, low back/core stabilization, and gaze training.   Baseline Target date 10/12/2016   Time 4   Period Weeks     PT SHORT TERM GOAL #2   Title Patient will improve Berg balance score from 41/56 to > or equal to 45/56 to demo dec'ing risk for falls.   Baseline Target date 10/12/2016   Time 4   Period Weeks     PT SHORT TERM GOAL #3   Title Patient will improve gait speed from 2.0 ft/sec to > or equa lto 2.4 ft/sec to demo improving functional gait.   Baseline Target date 10/12/2016   Time 4   Period Weeks     PT SHORT TERM GOAL #4   Title The patient will report back pain dec'd from 6/10 baseline to < or equal to 4/10.   Baseline Target date 10/12/2016   Time 4   Period Weeks     PT SHORT TERM GOAL #5   Title The patient will reduce 5 times sit<>stand from 17.37 seconds to < or equal to 15 seconds to demo improving functional mobility.   Baseline Target date 10/12/2016   Time 4   Period Weeks           PT Long Term Goals - 09/12/16 1009      PT LONG TERM GOAL #1   Title Patient and family will verbalize understanding of community resources for parkinson's disease.   Baseline Target date 11/11/2016   Time 8   Period Weeks     PT LONG TERM GOAL #2   Title Patient will improve gait speed from 2 ft/sec to > or equal to 2.7 ft/sec to demo transition to "full community ambulator" classification of gait.   Baseline Target date 11/11/2016   Time 8   Period  Weeks     PT LONG TERM GOAL #3   Title The patient will improve TUG from 14 seconds to < or equal to 13 seconds to demo dec'd fall risk.   Baseline Target  date 11/11/2016   Time 8   Period Weeks     PT LONG TERM GOAL #4   Title Pt will ambulate at least 1000 ft indoor and outdoor surfaces, with supervision, device as needed, for improved community gait.   Baseline Target date 11/11/2016   Time 8   Period Weeks     PT LONG TERM GOAL #5   Title Patient will improve Berg from 41/56 to > or equal to 50/56 to demo improving functional balance.   Baseline Target date 11/11/2016   Time 8   Period Weeks               Plan - 09/26/16 1947    Clinical Impression Statement Initiated HEP today to address back pain/flexibility and VOR.  Pt demonstrates understanding of exercises and verbalizes understanding of importance of HEP performance at home.  Pt will continue to benefit from progression of exercises to address dizziness and back pain to allow full participation in exercises and functional activties.   Rehab Potential Good   PT Frequency 2x / week   PT Duration 8 weeks   PT Treatment/Interventions ADLs/Self Care Home Management;Gait training;Functional mobility training;Patient/family education;Therapeutic activities;Therapeutic exercise;Balance training;Neuromuscular re-education;Vestibular   PT Next Visit Plan Reviewand progress HEP for low back (bridges, supine marching), hamstring stretching, trunk rotation, VOR x 1; begin power exercises in PT and transition to home as low back allows.   Consulted and Agree with Plan of Care Patient      Patient will benefit from skilled therapeutic intervention in order to improve the following deficits and impairments:  Abnormal gait, Decreased balance, Postural dysfunction, Impaired flexibility, Difficulty walking, Dizziness  Visit Diagnosis: Postural instability  Other abnormalities of gait and mobility     Problem List Patient  Active Problem List   Diagnosis Date Noted  . PBA (pseudobulbar affect) 06/24/2016  . Vascular dementia 06/24/2016  . Greater trochanteric bursitis of right hip 07/15/2014  . Bursitis of hip 07/15/2014  . Pulmonary embolism ([place]) 10/01/2013    Class: Acute  . Thrombocytopenia ([place]) 10/01/2013    Class: Acute  . Hypertension 10/01/2013    Class: Chronic  . Abdominal aneurysm without mention of rupture 01/15/2012    Cloyce Blankenhorn W. 09/26/2016, 7:52 PM  Frazier Butt., PT  [facility] [address] [place], [place], [address] Phone: [phone]   Fax:  [phone]  Name: Christian Wiley MRN: [id number] Date of Birth: February 14, 1935

## 2016-09-27 ENCOUNTER — Ambulatory Visit: Payer: PPO | Admitting: Physical Therapy

## 2016-09-27 DIAGNOSIS — R2689 Other abnormalities of gait and mobility: Secondary | ICD-10-CM | POA: Diagnosis not present

## 2016-09-27 DIAGNOSIS — R293 Abnormal posture: Secondary | ICD-10-CM

## 2016-09-27 NOTE — Therapy (Signed)
[name] [address] [place], [place], [address] Phone: [phone]   Fax:  [phone]  Physical Therapy Treatment  Patient Details  Name: Christian Wiley MRN: [id number] Date of Birth: 1935/01/17 Referring Provider: Alonza Bogus, DO  Encounter Date: 09/27/2016      PT End of Session - 09/27/16 1311    Visit Number 3   Number of Visits 16   Date for PT Re-Evaluation 11/10/16   Authorization Type G code every 10th visit   PT Start Time 0852  Pt arrives late   PT Stop Time 0930   PT Time Calculation (min) 38 min   Activity Tolerance Patient tolerated treatment well   Behavior During Therapy [facility] for tasks assessed/performed      Past Medical History:  Diagnosis Date  . AAA (abdominal aortic aneurysm) ([place])   . Arthritis   . Cancer (HCC)    hx of skin cancer   . GERD (gastroesophageal reflux disease)   . Hx of hiatal hernia   . Hyperlipidemia   . Hypertension   . Nocturia   . PE (pulmonary embolism)   . Pulmonary embolism ([place]) 2011 and 2014   . Sigmoid diverticulosis     Past Surgical History:  Procedure Laterality Date  . ABDOMINAL AORTIC ANEURYSM REPAIR  2001   endovascular repair  . BACK SURGERY     laminectomy  . BACK SURGERY    . CATARACT EXTRACTION, BILATERAL    . CHOLECYSTECTOMY    . EXCISION/RELEASE BURSA HIP Right 07/15/2014   Procedure: RIGHT HIP BURSECTOMY WITH TENDON REPAIR ;  Surgeon: Gearlean Alf, MD;  Location: [facility];  Service: Orthopedics;  Laterality: Right;  . JOINT REPLACEMENT     bilateral knee replacement  . ROTATOR CUFF REPAIR     bilateral    There were no vitals filed for this visit.      Subjective Assessment - 09/27/16 0855    Subjective No pain, just soreness today   Pertinent History h/o 2 back surgeries - has been told "you are going to have to live with it."   Patient Stated Goals "I don't have any goals."  Reports that he can't do things he once could  (cutting the grass, unable to walk long distances).  His spouse reports walking short and long distances are the biggest problem.  Unable to walk into [employer] without stopping multiple times.    Currently in Pain? Yes   Pain Score 6    Pain Location Back   Pain Orientation Lower   Pain Descriptors / Indicators Aching;Dull   Pain Type Chronic pain   Pain Onset More than a month ago   Pain Frequency Constant   Aggravating Factors  movements make it worse   Pain Relieving Factors sitting and resting                         [facility] Adult PT Treatment/Exercise - 09/27/16 0001      Ambulation/Gait   Ambulation/Gait Yes   Ambulation/Gait Assistance 6: Modified independent (Device/Increase time);5: Supervision   Ambulation/Gait Assistance Details Instructed in use of cane, including proper cane height, cane sequence   Ambulation Distance (Feet) 345 Feet  then 100   Assistive device Straight cane   Gait Pattern Decreased stride length;Decreased dorsiflexion - right;Decreased dorsiflexion - left;Shuffle;Poor foot clearance - left;Poor foot clearance - right;Narrow base of support;Decreased arm swing - right;Decreased arm swing - left;Step-through pattern;Decreased trunk  rotation  Foot clearance and step length improves with cane   Ambulation Surface Level;Indoor   Gait Comments Gait pattern improves with use of cane; therapist provides cues for widened BOS and weightshifting during gait.  Encouraged patient to use cane for long distances and outdoor surfaces.  Pt verbalizes understanding.     Exercises   Exercises Lumbar     Lumbar Exercises: Stretches   Active Hamstring Stretch 3 reps;30 seconds  sitting and supine (using sheet)   Lower Trunk Rotation 3 reps;30 seconds  preceded by gently rocking side to side     Lumbar Exercises: Supine   Bridge 10 reps;3 seconds   Other Supine Lumbar Exercises Hooklying marching x 10 reps each leg    Review of HEP given last visit,  including above lumbar exercises and VOR in sitting vertical and horizontal.  Pt needs cues for proper technique of exercises.    Neuro Re-education:     PWR [facility]) - 09/27/16 [id number]    PWR! exercises Moves in sitting   PWR! Up x 10   PWR! Rock x 10   PWR! Twist x 5 with constant cues for technique   PWR! Step x 10 reps, with single step out to the side   Comments Verbal, visual cues for technique of exercises             PT Education - 09/27/16 1310    Education provided Yes   Education Details Instructed in use of cane for outdoor surfaces, and long distances   Person(s) Educated Patient   Methods Explanation;Demonstration   Comprehension Verbalized understanding;Returned demonstration;Verbal cues required          PT Short Term Goals - 09/11/16 1051      PT SHORT TERM GOAL #1   Title The patient will perform HEP with family's supervision including balance, mobility, low back/core stabilization, and gaze training.   Baseline Target date 10/12/2016   Time 4   Period Weeks     PT SHORT TERM GOAL #2   Title Patient will improve Berg balance score from 41/56 to > or equal to 45/56 to demo dec'ing risk for falls.   Baseline Target date 10/12/2016   Time 4   Period Weeks     PT SHORT TERM GOAL #3   Title Patient will improve gait speed from 2.0 ft/sec to > or equa lto 2.4 ft/sec to demo improving functional gait.   Baseline Target date 10/12/2016   Time 4   Period Weeks     PT SHORT TERM GOAL #4   Title The patient will report back pain dec'd from 6/10 baseline to < or equal to 4/10.   Baseline Target date 10/12/2016   Time 4   Period Weeks     PT SHORT TERM GOAL #5   Title The patient will reduce 5 times sit<>stand from 17.37 seconds to < or equal to 15 seconds to demo improving functional mobility.   Baseline Target date 10/12/2016   Time 4   Period Weeks           PT Long Term Goals - 09/12/16 1009      PT LONG TERM GOAL #1   Title Patient and family  will verbalize understanding of community resources for parkinson's disease.   Baseline Target date 11/11/2016   Time 8   Period Weeks     PT LONG TERM GOAL #2   Title Patient will improve gait speed from 2 ft/sec to > or  equal to 2.7 ft/sec to demo transition to "full community ambulator" classification of gait.   Baseline Target date 11/11/2016   Time 8   Period Weeks     PT LONG TERM GOAL #3   Title The patient will improve TUG from 14 seconds to < or equal to 13 seconds to demo dec'd fall risk.   Baseline Target date 11/11/2016   Time 8   Period Weeks     PT LONG TERM GOAL #4   Title Pt will ambulate at least 1000 ft indoor and outdoor surfaces, with supervision, device as needed, for improved community gait.   Baseline Target date 11/11/2016   Time 8   Period Weeks     PT LONG TERM GOAL #5   Title Patient will improve Berg from 41/56 to > or equal to 50/56 to demo improving functional balance.   Baseline Target date 11/11/2016   Time 8   Period Weeks               Plan - 09/27/16 1311    Clinical Impression Statement Reviewed HEP today, with pt not having yet performed HEP at home.  Pt continues to need verbal cues for correct performance/technique of HEP.  Attempted seated PWR! Moves, with pt needing significant cues for technique and intensity.  Pt will continue to benefit from skilled physical therapy to address posture, balance, gait with emphasis on back pain management to encourage increased physical activity and movement.   Rehab Potential Good   PT Frequency 2x / week   PT Duration 8 weeks   PT Treatment/Interventions ADLs/Self Care Home Management;Gait training;Functional mobility training;Patient/family education;Therapeutic activities;Therapeutic exercise;Balance training;Neuromuscular re-education;Vestibular   PT Next Visit Plan  continue toprogress HEP for low back (bridges, supine marching), hamstring stretching, trunk rotation, VOR x 1 (try standing); begin  power exercises in PT and transition to home as low back allows.   Consulted and Agree with Plan of Care Patient      Patient will benefit from skilled therapeutic intervention in order to improve the following deficits and impairments:  Abnormal gait, Decreased balance, Postural dysfunction, Impaired flexibility, Difficulty walking, Dizziness  Visit Diagnosis: Postural instability  Other abnormalities of gait and mobility     Problem List Patient Active Problem List   Diagnosis Date Noted  . PBA (pseudobulbar affect) 06/24/2016  . Vascular dementia 06/24/2016  . Greater trochanteric bursitis of right hip 07/15/2014  . Bursitis of hip 07/15/2014  . Pulmonary embolism ([place]) 10/01/2013    Class: Acute  . Thrombocytopenia ([place]) 10/01/2013    Class: Acute  . Hypertension 10/01/2013    Class: Chronic  . Abdominal aneurysm without mention of rupture 01/15/2012    Kensie Susman W. 09/27/2016, 1:15 PM Frazier Butt., PT  [facility] [address] [place], [place], [address] Phone: [phone]   Fax:  [phone]  Name: SAABIR BATZ MRN: [id number] Date of Birth: 23-Jul-1935

## 2016-09-30 ENCOUNTER — Ambulatory Visit: Payer: PPO | Admitting: Physical Therapy

## 2016-09-30 DIAGNOSIS — R2689 Other abnormalities of gait and mobility: Secondary | ICD-10-CM

## 2016-09-30 DIAGNOSIS — R293 Abnormal posture: Secondary | ICD-10-CM

## 2016-09-30 NOTE — Therapy (Signed)
[facility] [address] [place], [place], [address] Phone: [phone]   Fax:  [phone]  Physical Therapy Treatment  Patient Details  Name: Christian Wiley MRN: [id number] Date of Birth: 1935-11-28 Referring Provider: Alonza Bogus, DO  Encounter Date: 09/30/2016      PT End of Session - 09/30/16 2008    Visit Number 4   Number of Visits 16   Date for PT Re-Evaluation 11/10/16   Authorization Type G code every 10th visit   PT Start Time [phone]  Pt arrives late-discussed with patient due to at least 2nd time being late   PT Stop Time 1016   PT Time Calculation (min) 38 min   Activity Tolerance Patient tolerated treatment well  Reports no pain at end of session   Behavior During Therapy [facility] for tasks assessed/performed      Past Medical History:  Diagnosis Date  . AAA (abdominal aortic aneurysm) ([place])   . Arthritis   . Cancer (HCC)    hx of skin cancer   . GERD (gastroesophageal reflux disease)   . Hx of hiatal hernia   . Hyperlipidemia   . Hypertension   . Nocturia   . PE (pulmonary embolism)   . Pulmonary embolism ([place]) 2011 and 2014   . Sigmoid diverticulosis     Past Surgical History:  Procedure Laterality Date  . ABDOMINAL AORTIC ANEURYSM REPAIR  2001   endovascular repair  . BACK SURGERY     laminectomy  . BACK SURGERY    . CATARACT EXTRACTION, BILATERAL    . CHOLECYSTECTOMY    . EXCISION/RELEASE BURSA HIP Right 07/15/2014   Procedure: RIGHT HIP BURSECTOMY WITH TENDON REPAIR ;  Surgeon: Gearlean Alf, MD;  Location: [facility];  Service: Orthopedics;  Laterality: Right;  . JOINT REPLACEMENT     bilateral knee replacement  . ROTATOR CUFF REPAIR     bilateral    There were no vitals filed for this visit.      Subjective Assessment - 09/30/16 0939    Subjective Having a hard time getting up in the mornings to get here on time.  Having a little more pain in left side of back-been working  hard in the yard.   Pertinent History h/o 2 back surgeries - has been told "you are going to have to live with it."   Patient Stated Goals "I don't have any goals."  Reports that he can't do things he once could (cutting the grass, unable to walk long distances).  His spouse reports walking short and long distances are the biggest problem.  Unable to walk into [employer] without stopping multiple times.    Currently in Pain? Yes   Pain Score 8    Pain Location Back   Pain Orientation Left;Lower   Pain Descriptors / Indicators Aching   Pain Type Chronic pain   Pain Onset More than a month ago   Pain Frequency Constant   Aggravating Factors  working in the yard   Pain Relieving Factors unsure what alleviates                         [facility] Adult PT Treatment/Exercise - 09/30/16 0941      Ambulation/Gait   Ambulation/Gait Yes   Ambulation/Gait Assistance 6: Modified independent (Device/Increase time);5: Supervision   Ambulation Distance (Feet) 330 Feet  then 120   Assistive device Straight cane   Gait Pattern Decreased stride length;Decreased  dorsiflexion - right;Decreased dorsiflexion - left;Poor foot clearance - left;Poor foot clearance - right;Decreased arm swing - right;Decreased arm swing - left;Step-through pattern;Decreased trunk rotation  Improved gait pattern with cane   Ambulation Surface Level;Indoor   Gait Comments Discussed with patient benefits of using cane with gait, including improved step length, foot clearance and overall ease of gait pattern.  Encouraged patient to use cane for longer distance gait, several times per day around home or around cul[place]de[place]sac.     Exercises   Exercises Lumbar  Review of HEP     Lumbar Exercises: Stretches   Active Hamstring Stretch 3 reps;30 seconds  supine using sheet   Single Knee to Chest Stretch 30 seconds;2 reps   Lower Trunk Rotation 3 reps;10 seconds   Pelvic Tilt 5 reps;10 seconds;Limitations  Attempted with tactile  cues, pt has difficulty with tilts     Lumbar Exercises: Supine   Bridge 10 reps;3 seconds   Other Supine Lumbar Exercises Hooklying marching x 10 reps each leg           PWR [facility]) - 09/30/16 0959    PWR! exercises Moves in sitting;Moves in quadraped  modified quadruped at counter   PWR! Up x 10   PWR! Rock x 10   PWR! Twist x 10   Comments Cues provided for technique and intensity of movement pattern for modified quadruped at counter   PWR! Up x 10   PWR! Rock x 10    PWR! Twist x 10-modified with gentle trunk rotation looking over shoulder at mat table   PWR! Step x 10 reps-with cues for slowed pacing and deliberate technique with exercises   Comments PWR! Moves in sitting-pt requires verbal and visual cues, for technique and for intensity               PT Short Term Goals - 09/11/16 1051      PT SHORT TERM GOAL #1   Title The patient will perform HEP with family's supervision including balance, mobility, low back/core stabilization, and gaze training.   Baseline Target date 10/12/2016   Time 4   Period Weeks     PT SHORT TERM GOAL #2   Title Patient will improve Berg balance score from 41/56 to > or equal to 45/56 to demo dec'ing risk for falls.   Baseline Target date 10/12/2016   Time 4   Period Weeks     PT SHORT TERM GOAL #3   Title Patient will improve gait speed from 2.0 ft/sec to > or equa lto 2.4 ft/sec to demo improving functional gait.   Baseline Target date 10/12/2016   Time 4   Period Weeks     PT SHORT TERM GOAL #4   Title The patient will report back pain dec'd from 6/10 baseline to < or equal to 4/10.   Baseline Target date 10/12/2016   Time 4   Period Weeks     PT SHORT TERM GOAL #5   Title The patient will reduce 5 times sit<>stand from 17.37 seconds to < or equal to 15 seconds to demo improving functional mobility.   Baseline Target date 10/12/2016   Time 4   Period Weeks           PT Long Term Goals - 09/12/16 1009      PT LONG  TERM GOAL #1   Title Patient and family will verbalize understanding of community resources for parkinson's disease.   Baseline Target date 11/11/2016   Time  8   Period Weeks     PT LONG TERM GOAL #2   Title Patient will improve gait speed from 2 ft/sec to > or equal to 2.7 ft/sec to demo transition to "full community ambulator" classification of gait.   Baseline Target date 11/11/2016   Time 8   Period Weeks     PT LONG TERM GOAL #3   Title The patient will improve TUG from 14 seconds to < or equal to 13 seconds to demo dec'd fall risk.   Baseline Target date 11/11/2016   Time 8   Period Weeks     PT LONG TERM GOAL #4   Title Pt will ambulate at least 1000 ft indoor and outdoor surfaces, with supervision, device as needed, for improved community gait.   Baseline Target date 11/11/2016   Time 8   Period Weeks     PT LONG TERM GOAL #5   Title Patient will improve Berg from 41/56 to > or equal to 50/56 to demo improving functional balance.   Baseline Target date 11/11/2016   Time 8   Period Weeks               Plan - 09/30/16 2009    Clinical Impression Statement Pt has improvement in pain rating during course of session, with 8/10 rating at beginning of session and 0/10 pain at end of session, with PT discussing possible correlation of increased stretching, exercises and general mobility as means for managing back pain.  Pt will continue to benefit from further skilled PT to address balance, gait, posture exercises.   Rehab Potential Good   PT Frequency 2x / week   PT Duration 8 weeks   PT Treatment/Interventions ADLs/Self Care Home Management;Gait training;Functional mobility training;Patient/family education;Therapeutic activities;Therapeutic exercise;Balance training;Neuromuscular re-education;Vestibular   PT Next Visit Plan PWR! Moves in sitting, modified quadruped and standing, gait training with cane   Consulted and Agree with Plan of Care Patient      Patient will  benefit from skilled therapeutic intervention in order to improve the following deficits and impairments:  Abnormal gait, Decreased balance, Postural dysfunction, Impaired flexibility, Difficulty walking, Dizziness  Visit Diagnosis: Postural instability  Other abnormalities of gait and mobility     Problem List Patient Active Problem List   Diagnosis Date Noted  . PBA (pseudobulbar affect) 06/24/2016  . Vascular dementia 06/24/2016  . Greater trochanteric bursitis of right hip 07/15/2014  . Bursitis of hip 07/15/2014  . Pulmonary embolism ([place]) 10/01/2013    Class: Acute  . Thrombocytopenia ([place]) 10/01/2013    Class: Acute  . Hypertension 10/01/2013    Class: Chronic  . Abdominal aneurysm without mention of rupture 01/15/2012    Gearldine Looney W. 09/30/2016, 8:13 PM  Frazier Butt., PT  [facility] [address] [place], [place], [address] Phone: [phone]   Fax:  [phone]  Name: Christian Wiley MRN: [id number] Date of Birth: 1935-06-18

## 2016-10-03 ENCOUNTER — Ambulatory Visit: Payer: PPO | Admitting: Physical Therapy

## 2016-10-03 DIAGNOSIS — R2689 Other abnormalities of gait and mobility: Secondary | ICD-10-CM

## 2016-10-03 DIAGNOSIS — R2681 Unsteadiness on feet: Secondary | ICD-10-CM

## 2016-10-04 DIAGNOSIS — F329 Major depressive disorder, single episode, unspecified: Secondary | ICD-10-CM | POA: Diagnosis not present

## 2016-10-04 NOTE — Therapy (Signed)
[facility] [address] [place], [place], [address] Phone: [phone]   Fax:  [phone]  Physical Therapy Treatment  Patient Details  Name: Christian Wiley MRN: [id number] Date of Birth: August 13, 1935 Referring Provider: Alonza Bogus, DO  Encounter Date: 10/03/2016      PT End of Session - 10/04/16 0730    Visit Number 5   Number of Visits 16   Date for PT Re-Evaluation 11/10/16   Authorization Type G code every 10th visit   PT Start Time 0849   PT Stop Time 0930   PT Time Calculation (min) 41 min   Activity Tolerance Patient tolerated treatment well   Behavior During Therapy [facility] for tasks assessed/performed      Past Medical History:  Diagnosis Date  . AAA (abdominal aortic aneurysm) ([place])   . Arthritis   . Cancer (HCC)    hx of skin cancer   . GERD (gastroesophageal reflux disease)   . Hx of hiatal hernia   . Hyperlipidemia   . Hypertension   . Nocturia   . PE (pulmonary embolism)   . Pulmonary embolism ([place]) 2011 and 2014   . Sigmoid diverticulosis     Past Surgical History:  Procedure Laterality Date  . ABDOMINAL AORTIC ANEURYSM REPAIR  2001   endovascular repair  . BACK SURGERY     laminectomy  . BACK SURGERY    . CATARACT EXTRACTION, BILATERAL    . CHOLECYSTECTOMY    . EXCISION/RELEASE BURSA HIP Right 07/15/2014   Procedure: RIGHT HIP BURSECTOMY WITH TENDON REPAIR ;  Surgeon: Gearlean Alf, MD;  Location: [facility];  Service: Orthopedics;  Laterality: Right;  . JOINT REPLACEMENT     bilateral knee replacement  . ROTATOR CUFF REPAIR     bilateral    There were no vitals filed for this visit.      Subjective Assessment - 10/03/16 0851    Subjective No changes since last visit.  Don't want to use the cane because I don't want to be dependent on it.   Pertinent History h/o 2 back surgeries - has been told "you are going to have to live with it."   Patient Stated Goals "I don't have any  goals."  Reports that he can't do things he once could (cutting the grass, unable to walk long distances).  His spouse reports walking short and long distances are the biggest problem.  Unable to walk into [employer] without stopping multiple times.    Currently in Pain? Yes   Pain Score 5    Pain Location Buttocks   Pain Orientation Left;Right;Lower   Pain Descriptors / Indicators Sore   Pain Type Acute pain   Pain Onset More than a month ago   Pain Frequency Constant   Aggravating Factors  working in the yard   Pain Relieving Factors unsure what alleviates                          [facility] Adult PT Treatment/Exercise - 10/03/16 0853      Ambulation/Gait   Ambulation/Gait Yes   Ambulation/Gait Assistance 6: Modified independent (Device/Increase time);5: Supervision   Ambulation/Gait Assistance Details Occasional cues for increased step length and increased heelstrike, foot clearance.   Ambulation Distance (Feet) 400 Feet  then 400 ft   Assistive device Straight cane   Gait Pattern Decreased stride length;Decreased dorsiflexion - right;Decreased dorsiflexion - left;Poor foot clearance - left;Poor foot clearance -  right;Decreased arm swing - right;Decreased arm swing - left;Step-through pattern;Decreased trunk rotation   Ambulation Surface Level;Indoor   Gait Comments Discussed again with patient benefits of using cane with gait, including improved step length, foot clearance and overall ease of gait pattern.  Encouraged patient again to use cane for longer distance gait, several times per day around home or around cul-de-sac.     High Level Balance   High Level Balance Activities Side stepping;Backward walking  Forward/back walking-parallel bars 3 reps 53ft each           [facility]) - 10/03/16 0903    PWR! exercises Moves in standing   PWR! Up x 10   PWR! Rock x 10   PWR Step x 10   Comments UE of counter; cues provided for technique and intensity of PWR! Moves in  standing          Balance Exercises - 10/03/16 0909      Balance Exercises: Standing   Wall Bumps Hip   Wall Bumps-Hips Eyes opened;10 reps   Stepping Strategy Anterior;Posterior;UE support;5 reps;10 reps  10 reps anterior; 5 reps posterior (with max cues)   Heel Raises Limitations x 10 reps at counter   Toe Raise Limitations x 10 reps at counter   Other Standing Exercises Standing on foam:  marching in place x 10 reps, forward kicks x 10, forward step taps x 10, alternating legs with bilateral UE support.             PT Short Term Goals - 09/11/16 1051      PT SHORT TERM GOAL #1   Title The patient will perform HEP with family's supervision including balance, mobility, low back/core stabilization, and gaze training.   Baseline Target date 10/12/2016   Time 4   Period Weeks     PT SHORT TERM GOAL #2   Title Patient will improve Berg balance score from 41/56 to > or equal to 45/56 to demo dec'ing risk for falls.   Baseline Target date 10/12/2016   Time 4   Period Weeks     PT SHORT TERM GOAL #3   Title Patient will improve gait speed from 2.0 ft/sec to > or equa lto 2.4 ft/sec to demo improving functional gait.   Baseline Target date 10/12/2016   Time 4   Period Weeks     PT SHORT TERM GOAL #4   Title The patient will report back pain dec'd from 6/10 baseline to < or equal to 4/10.   Baseline Target date 10/12/2016   Time 4   Period Weeks     PT SHORT TERM GOAL #5   Title The patient will reduce 5 times sit<>stand from 17.37 seconds to < or equal to 15 seconds to demo improving functional mobility.   Baseline Target date 10/12/2016   Time 4   Period Weeks           PT Long Term Goals - 09/12/16 1009      PT LONG TERM GOAL #1   Title Patient and family will verbalize understanding of community resources for parkinson's disease.   Baseline Target date 11/11/2016   Time 8   Period Weeks     PT LONG TERM GOAL #2   Title Patient will improve gait speed from 2  ft/sec to > or equal to 2.7 ft/sec to demo transition to "full community ambulator" classification of gait.   Baseline Target date 11/11/2016   Time 8   Period  Weeks     PT LONG TERM GOAL #3   Title The patient will improve TUG from 14 seconds to < or equal to 13 seconds to demo dec'd fall risk.   Baseline Target date 11/11/2016   Time 8   Period Weeks     PT LONG TERM GOAL #4   Title Pt will ambulate at least 1000 ft indoor and outdoor surfaces, with supervision, device as needed, for improved community gait.   Baseline Target date 11/11/2016   Time 8   Period Weeks     PT LONG TERM GOAL #5   Title Patient will improve Berg from 41/56 to > or equal to 50/56 to demo improving functional balance.   Baseline Target date 11/11/2016   Time 8   Period Weeks               Plan - 10/04/16 [id number]    Clinical Impression Statement Pt does not c/o pain at end of session today.  Pt requies UE support for most balance exercises in standing in parallel bars.  Pt is c/o increased pain at beginning of sessions, due to increased yardwork he is doing at home.  May consider discussing body mechanics and positioning/posture for work activities.  Pt will conitnue to benefit from further skilled PT to address balance, gait and posture exercises.   Rehab Potential Good   PT Frequency 2x / week   PT Duration 8 weeks   PT Treatment/Interventions ADLs/Self Care Home Management;Gait training;Functional mobility training;Patient/family education;Therapeutic activities;Therapeutic exercise;Balance training;Neuromuscular re-education;Vestibular   PT Next Visit Plan Standing balance exericses-SLS, stepping strategies and compliant surfaces.  Check STGs.   Consulted and Agree with Plan of Care Patient      Patient will benefit from skilled therapeutic intervention in order to improve the following deficits and impairments:  Abnormal gait, Decreased balance, Postural dysfunction, Impaired flexibility, Difficulty  walking, Dizziness  Visit Diagnosis: Other abnormalities of gait and mobility  Unsteadiness on feet     Problem List Patient Active Problem List   Diagnosis Date Noted  . PBA (pseudobulbar affect) 06/24/2016  . Vascular dementia 06/24/2016  . Greater trochanteric bursitis of right hip 07/15/2014  . Bursitis of hip 07/15/2014  . Pulmonary embolism ([place]) 10/01/2013    Class: Acute  . Thrombocytopenia ([place]) 10/01/2013    Class: Acute  . Hypertension 10/01/2013    Class: Chronic  . Abdominal aneurysm without mention of rupture 01/15/2012    Ajdin Macke W. 10/04/2016, 7:35 AM  Frazier Butt., PT  [facility] [address] [place], [place], [address] Phone: [phone]   Fax:  [phone]  Name: Christian Wiley MRN: [id number] Date of Birth: 1935-11-04

## 2016-10-08 ENCOUNTER — Ambulatory Visit: Payer: PPO | Admitting: Rehabilitative and Restorative Service Providers"

## 2016-10-08 DIAGNOSIS — M6281 Muscle weakness (generalized): Secondary | ICD-10-CM

## 2016-10-08 DIAGNOSIS — R2689 Other abnormalities of gait and mobility: Secondary | ICD-10-CM

## 2016-10-08 DIAGNOSIS — R2681 Unsteadiness on feet: Secondary | ICD-10-CM

## 2016-10-08 NOTE — Therapy (Signed)
[facility] [address] [place], [place], [address] Phone: [phone]   Fax:  [phone]  Physical Therapy Treatment  Patient Details  Name: Christian Wiley MRN: [id number] Date of Birth: 02-Aug-1935 Referring Provider: Alonza Bogus, DO  Encounter Date: 10/08/2016      PT End of Session - 10/08/16 1401    Visit Number 6   Number of Visits 16   Date for PT Re-Evaluation 11/10/16   Authorization Type G code every 10th visit   PT Start Time 0935   PT Stop Time 1015   PT Time Calculation (min) 40 min   Activity Tolerance Patient tolerated treatment well   Behavior During Therapy [facility] for tasks assessed/performed      Past Medical History:  Diagnosis Date  . AAA (abdominal aortic aneurysm) ([place])   . Arthritis   . Cancer (HCC)    hx of skin cancer   . GERD (gastroesophageal reflux disease)   . Hx of hiatal hernia   . Hyperlipidemia   . Hypertension   . Nocturia   . PE (pulmonary embolism)   . Pulmonary embolism ([place]) 2011 and 2014   . Sigmoid diverticulosis     Past Surgical History:  Procedure Laterality Date  . ABDOMINAL AORTIC ANEURYSM REPAIR  2001   endovascular repair  . BACK SURGERY     laminectomy  . BACK SURGERY    . CATARACT EXTRACTION, BILATERAL    . CHOLECYSTECTOMY    . EXCISION/RELEASE BURSA HIP Right 07/15/2014   Procedure: RIGHT HIP BURSECTOMY WITH TENDON REPAIR ;  Surgeon: Gearlean Alf, MD;  Location: [facility];  Service: Orthopedics;  Laterality: Right;  . JOINT REPLACEMENT     bilateral knee replacement  . ROTATOR CUFF REPAIR     bilateral    There were no vitals filed for this visit.      Subjective Assessment - 10/08/16 0940    Subjective The patient reports he walked his dog yesterday and noted some dizziness "I feel like my head is going to start spinning", but it doesn't last long.     Pertinent History h/o 2 back surgeries    Patient Stated Goals "I don't have any goals."   Reports that he can't do things he once could (cutting the grass, unable to walk long distances).  His spouse reports walking short and long distances are the biggest problem.  Unable to walk into [employer] without stopping multiple times.    Currently in Pain? Yes   Pain Score 4    Pain Location Back   Pain Orientation Lower   Pain Descriptors / Indicators Sore   Pain Type Acute pain   Pain Onset More than a month ago   Pain Frequency Constant   Aggravating Factors  stiff this morning   Pain Relieving Factors unsure      Gait: Community gait with supervision and then CGA when fatigued due to decreased foot clearance x 4 minutes nonstop without a device 1.95 ft/sec at end of session for gait speed  THERAPEUTIC EXERCISE: Marcello Moores test stretch R and L sides Sit<>stand x 10 reps with dec'ing UE support Heel raises without UE support with CGA working on strength and balance control      [facility] PT Assessment - 10/08/16 0954      Transfers   Five time sit to stand comments  18.2 seconds (eval was 17.37 sec)     Berg Balance Test   Sit to Stand Able  to stand without using hands and stabilize independently   Standing Unsupported Able to stand safely 2 minutes   Sitting with Back Unsupported but Feet Supported on Floor or Stool Able to sit safely and securely 2 minutes   Stand to Sit Sits safely with minimal use of hands   Transfers Able to transfer safely, minor use of hands   Standing Unsupported with Eyes Closed Able to stand 10 seconds safely   Standing Ubsupported with Feet Together Able to place feet together independently and stand 1 minute safely   From Standing, Reach Forward with Outstretched Arm Can reach forward >12 cm safely (5")   From Standing Position, Pick up Object from Floor Able to pick up shoe safely and easily   From Standing Position, Turn to Look Behind Over each Shoulder Turn sideways only but maintains balance   Turn 360 Degrees Able to turn 360 degrees safely but  slowly   Standing Unsupported, Alternately Place Feet on Step/Stool Able to complete 4 steps without aid or supervision   Standing Unsupported, One Foot in Front Able to take small step independently and hold 30 seconds   Standing on One Leg Able to lift leg independently and hold equal to or more than 3 seconds   Total Score 45   Berg comment: 45/56 improved from 41/56 at initial evaluation.    NEUROMUSCULAR RE-EDUCATION: Berg=45/56 Standing cone tapping with min A for support with alternating lateral weight shifting Checked bilateral sit<>sidelying for positional symptoms:  Patient has mild nystagmus noted to the left, but no subjective complaints of dizziness.  No nystagmus noted with L dix hallpike testing.          PT Short Term Goals - 10/08/16 1002      PT SHORT TERM GOAL #1   Title The patient will perform HEP with family's supervision including balance, mobility, low back/core stabilization, and gaze training.   Baseline Target date 10/12/2016   Time 4   Period Weeks     PT SHORT TERM GOAL #2   Title Patient will improve Berg balance score from 41/56 to > or equal to 45/56 to demo dec'ing risk for falls.   Baseline Improved to 45/56.   Time 4   Period Weeks   Status Achieved     PT SHORT TERM GOAL #3   Title Patient will improve gait speed from 2.0 ft/sec to > or equa lto 2.4 ft/sec to demo improving functional gait.   Baseline Target date 10/12/2016   Time 4   Period Weeks     PT SHORT TERM GOAL #4   Title The patient will report back pain dec'd from 6/10 baseline to < or equal to 4/10.   Baseline Pt notes 4/10 back pain at rest.   Time 4   Period Weeks   Status Achieved     PT SHORT TERM GOAL #5   Title The patient will reduce 5 times sit<>stand from 17.37 seconds to < or equal to 15 seconds to demo improving functional mobility.   Baseline Increased to 18 seconds on 10/08/2016   Time 4   Period Weeks   Status Not Met           PT Long Term Goals -  09/12/16 1009      PT LONG TERM GOAL #1   Title Patient and family will verbalize understanding of community resources for parkinson's disease.   Baseline Target date 11/11/2016   Time 8   Period Weeks  PT LONG TERM GOAL #2   Title Patient will improve gait speed from 2 ft/sec to > or equal to 2.7 ft/sec to demo transition to "full community ambulator" classification of gait.   Baseline Target date 11/11/2016   Time 8   Period Weeks     PT LONG TERM GOAL #3   Title The patient will improve TUG from 14 seconds to < or equal to 13 seconds to demo dec'd fall risk.   Baseline Target date 11/11/2016   Time 8   Period Weeks     PT LONG TERM GOAL #4   Title Pt will ambulate at least 1000 ft indoor and outdoor surfaces, with supervision, device as needed, for improved community gait.   Baseline Target date 11/11/2016   Time 8   Period Weeks     PT LONG TERM GOAL #5   Title Patient will improve Berg from 41/56 to > or equal to 50/56 to demo improving functional balance.   Baseline Target date 11/11/2016   Time 8   Period Weeks               Plan - 10/08/16 1402    Clinical Impression Statement The patient c/o spinning dizziness, however positional symtpoms not provoked today.  Patient met 2 STGs- PT to continue working towards remaining STGs and discuss plan of care with patient.  Would advise increased compliance to continue working towards [place].   PT Treatment/Interventions ADLs/Self Care Home Management;Gait training;Functional mobility training;Patient/family education;Therapeutic activities;Therapeutic exercise;Balance training;Neuromuscular re-education;Vestibular   PT Next Visit Plan Finish checking STGs.  Determine from progress if further visits need to be added to current schedule (discuss with patient).   Consulted and Agree with Plan of Care Patient      Patient will benefit from skilled therapeutic intervention in order to improve the following deficits and  impairments:  Abnormal gait, Decreased balance, Postural dysfunction, Impaired flexibility, Difficulty walking, Dizziness  Visit Diagnosis: Other abnormalities of gait and mobility  Unsteadiness on feet  Muscle weakness of lower extremity     Problem List Patient Active Problem List   Diagnosis Date Noted  . PBA (pseudobulbar affect) 06/24/2016  . Vascular dementia 06/24/2016  . Greater trochanteric bursitis of right hip 07/15/2014  . Bursitis of hip 07/15/2014  . Pulmonary embolism ([place]) 10/01/2013    Class: Acute  . Thrombocytopenia ([place]) 10/01/2013    Class: Acute  . Hypertension 10/01/2013    Class: Chronic  . Abdominal aneurysm without mention of rupture 01/15/2012    Tamanna Whitson, PT 10/08/2016, 2:15 PM  [facility] [address] [place], [place], [address] Phone: [phone]   Fax:  [phone]  Name: Christian Wiley MRN: [id number] Date of Birth: [birth year]

## 2016-10-10 ENCOUNTER — Ambulatory Visit: Payer: PPO | Attending: Neurology | Admitting: Rehabilitative and Restorative Service Providers"

## 2016-10-10 ENCOUNTER — Telehealth: Payer: Self-pay | Admitting: Neurology

## 2016-10-10 DIAGNOSIS — M6281 Muscle weakness (generalized): Secondary | ICD-10-CM | POA: Diagnosis not present

## 2016-10-10 DIAGNOSIS — R2689 Other abnormalities of gait and mobility: Secondary | ICD-10-CM | POA: Insufficient documentation

## 2016-10-10 DIAGNOSIS — R2681 Unsteadiness on feet: Secondary | ICD-10-CM | POA: Insufficient documentation

## 2016-10-10 NOTE — Telephone Encounter (Signed)
Patient's wife called very nervous because patient accidentally took all three of his Levodopa this morning.  I explained to wife that this was okay- some patients are on that dose but he may feel lightheaded and nauseated for a little while. Will keep an eye on blood pressure. Made her aware these are short acting and will leave his system after a few hours. He can continue his subsequent doses for the rest of the day.   She wanted to know if he would be able to drive to his therapy appt. Patient has [place] 17 and probable dementia, I made her aware patient should not be driving himself at all.   She will call with any further problems.

## 2016-10-10 NOTE — Therapy (Signed)
[facility] [address] [place], [place], [address] Phone: [phone]   Fax:  [phone]  Physical Therapy Treatment  Patient Details  Name: Christian Wiley MRN: [id number] Date of Birth: [birth year] Referring Provider: Alonza Bogus, DO  Encounter Date: 10/10/2016      PT End of Session - 10/10/16 1014    Visit Number 7   Number of Visits 16   Date for PT Re-Evaluation 11/10/16   Authorization Type G code every 10th visit   PT Start Time 0936   PT Stop Time 1016   PT Time Calculation (min) 40 min   Equipment Utilized During Treatment Gait belt   Activity Tolerance Patient tolerated treatment well   Behavior During Therapy [facility] for tasks assessed/performed      Past Medical History:  Diagnosis Date  . AAA (abdominal aortic aneurysm) ([place])   . Arthritis   . Cancer (HCC)    hx of skin cancer   . GERD (gastroesophageal reflux disease)   . Hx of hiatal hernia   . Hyperlipidemia   . Hypertension   . Nocturia   . PE (pulmonary embolism)   . Pulmonary embolism ([place]) 2011 and 2014   . Sigmoid diverticulosis     Past Surgical History:  Procedure Laterality Date  . ABDOMINAL AORTIC ANEURYSM REPAIR  2001   endovascular repair  . BACK SURGERY     laminectomy  . BACK SURGERY    . CATARACT EXTRACTION, BILATERAL    . CHOLECYSTECTOMY    . EXCISION/RELEASE BURSA HIP Right 07/15/2014   Procedure: RIGHT HIP BURSECTOMY WITH TENDON REPAIR ;  Surgeon: Gearlean Alf, MD;  Location: [facility];  Service: Orthopedics;  Laterality: Right;  . JOINT REPLACEMENT     bilateral knee replacement  . ROTATOR CUFF REPAIR     bilateral    There were no vitals filed for this visit.      Subjective Assessment - 10/10/16 0939    Subjective The patient is c/o low back pain this morning "its killing me."  He notes he raked yesterday in the yard and he has increased soreness today.  The patient had a fall Tuesday when backing up and  holding lawnmower.  He was able to get up using lawn mower to pull up on.    Patient Stated Goals "I don't have any goals."  Reports that he can't do things he once could (cutting the grass, unable to walk long distances).  His spouse reports walking short and long distances are the biggest problem.  Unable to walk into [employer] without stopping multiple times.    Currently in Pain? Yes   Pain Score 8    Pain Location Back   Pain Orientation Lower   Pain Descriptors / Indicators Sore   Pain Type Chronic pain   Pain Onset More than a month ago   Pain Frequency Constant   Aggravating Factors  raking yesterday aggravated pain   Pain Relieving Factors unsure      Gait: Ambulation x 400 feet nonstop x 3 repetitions with verbal cues for longer stride length and tactile cues for arm swing with reciprocal pattern.  THERAPEUTIC EXERCISE: REviewed all prior HEP including:  Supine bridges x 10 reps, hamstring stretches x 1 rep each side, supine marching x 10 reps bilat, seated HS stretch, supine trunk rotation Sit<>stand x 10 reps emphasizing upright posture x 2 sets Standing trunk flexion/extension with weight bearing through UEs and standing trunk rotation R/L  with UEs leaning on countertop for support  NEUROMUSCULAR RE-EDUCATION: Alternating LE foot taps to 6" step with emphasis on powerful movements and tall posture with CGA for safety Backwards walking with min A for safety  SELF CARE/HOME MANAGEMENT: Discussed lack of participation with HEP.  The patient acknowledges that exercise improves low back pain, however he does not perform at home.  He was given information on available community class for Parkinson's, however that is a group setting and he may require more physical assist and attention redirection to participate in group setting for safe exercise.  PT and patient discussed plan to d/c next week with emphasis on home participation in activities.  His wife notes lack of interest in exercise  in the home.      PT Education - 10/10/16 1646    Education provided Yes   Education Details Reviewed prior HEP, provided community education class information for Parkinson's   Person(s) Educated Patient;Spouse   Methods Explanation;Demonstration;Handout   Comprehension Verbalized understanding;Returned demonstration          PT Short Term Goals - 10/08/16 1002      PT SHORT TERM GOAL #1   Title The patient will perform HEP with family's supervision including balance, mobility, low back/core stabilization, and gaze training.   Baseline Target date 10/12/2016   Time 4   Period Weeks     PT SHORT TERM GOAL #2   Title Patient will improve Berg balance score from 41/56 to > or equal to 45/56 to demo dec'ing risk for falls.   Baseline Improved to 45/56.   Time 4   Period Weeks   Status Achieved     PT SHORT TERM GOAL #3   Title Patient will improve gait speed from 2.0 ft/sec to > or equa lto 2.4 ft/sec to demo improving functional gait.   Baseline Target date 10/12/2016   Time 4   Period Weeks     PT SHORT TERM GOAL #4   Title The patient will report back pain dec'd from 6/10 baseline to < or equal to 4/10.   Baseline Pt notes 4/10 back pain at rest.   Time 4   Period Weeks   Status Achieved     PT SHORT TERM GOAL #5   Title The patient will reduce 5 times sit<>stand from 17.37 seconds to < or equal to 15 seconds to demo improving functional mobility.   Baseline Increased to 18 seconds on 10/08/2016   Time 4   Period Weeks   Status Not Met           PT Long Term Goals - 09/12/16 1009      PT LONG TERM GOAL #1   Title Patient and family will verbalize understanding of community resources for parkinson's disease.   Baseline Target date 11/11/2016   Time 8   Period Weeks     PT LONG TERM GOAL #2   Title Patient will improve gait speed from 2 ft/sec to > or equal to 2.7 ft/sec to demo transition to "full community ambulator" classification of gait.   Baseline  Target date 11/11/2016   Time 8   Period Weeks     PT LONG TERM GOAL #3   Title The patient will improve TUG from 14 seconds to < or equal to 13 seconds to demo dec'd fall risk.   Baseline Target date 11/11/2016   Time 8   Period Weeks     PT LONG TERM GOAL #4  Title Pt will ambulate at least 1000 ft indoor and outdoor surfaces, with supervision, device as needed, for improved community gait.   Baseline Target date 11/11/2016   Time 8   Period Weeks     PT LONG TERM GOAL #5   Title Patient will improve Berg from 41/56 to > or equal to 50/56 to demo improving functional balance.   Baseline Target date 11/11/2016   Time 8   Period Weeks               Plan - 10/10/16 1647    Clinical Impression Statement The patient's low back pain reduces with mobility and exercise.  He initially c/o 8/10 pain and reported 2/10 pain at end of session.  PT focused on reviewing prior HEP, low back stretching/mobility, and gait activities today.     PT Treatment/Interventions ADLs/Self Care Home Management;Gait training;Functional mobility training;Patient/family education;Therapeutic activities;Therapeutic exercise;Balance training;Neuromuscular re-education;Vestibular   PT Next Visit Plan Complete STG check and plan to d/c next week.  Ask about HEP- patient is supposed to try over weekend.  Discuss if Parkinson's group class appropriate--unsure due to patient needing redirection to focus in therapy and physical level.   Consulted and Agree with Plan of Care Patient   Family Member Consulted spouse, Jan came in at first portion of PT session and then at end of session.  She notes no carryover of exercise to home.      Patient will benefit from skilled therapeutic intervention in order to improve the following deficits and impairments:  Abnormal gait, Decreased balance, Postural dysfunction, Impaired flexibility, Difficulty walking, Dizziness  Visit Diagnosis: Other abnormalities of gait and  mobility  Unsteadiness on feet  Muscle weakness of lower extremity     Problem List Patient Active Problem List   Diagnosis Date Noted  . PBA (pseudobulbar affect) 06/24/2016  . Vascular dementia 06/24/2016  . Greater trochanteric bursitis of right hip 07/15/2014  . Bursitis of hip 07/15/2014  . Pulmonary embolism ([place]) 10/01/2013    Class: Acute  . Thrombocytopenia ([place]) 10/01/2013    Class: Acute  . Hypertension 10/01/2013    Class: Chronic  . Abdominal aneurysm without mention of rupture 01/15/2012    Jameson Tormey, PT 10/10/2016, 4:51 PM  [facility] [address] [place], [place], [address] Phone: [phone]   Fax:  [phone]  Name: LOPAKA KARGE MRN: [id number] Date of Birth: [birth year]

## 2016-10-10 NOTE — Telephone Encounter (Signed)
Agree entirely with above.

## 2016-10-15 ENCOUNTER — Ambulatory Visit: Payer: PPO | Admitting: Physical Therapy

## 2016-10-15 DIAGNOSIS — R2689 Other abnormalities of gait and mobility: Secondary | ICD-10-CM

## 2016-10-15 DIAGNOSIS — R2681 Unsteadiness on feet: Secondary | ICD-10-CM

## 2016-10-15 NOTE — Therapy (Signed)
[facility] [address] [place], [place], [address] Phone: [phone]   Fax:  [phone]  Physical Therapy Treatment  Patient Details  Name: Christian Wiley MRN: [id number] Date of Birth: 1935-05-27 Referring Provider: Alonza Bogus, DO  Encounter Date: 10/15/2016      PT End of Session - 10/15/16 1428    Visit Number 8   Number of Visits 16   Date for PT Re-Evaluation 11/10/16   Authorization Type G code every 10th visit   PT Start Time 1026  Pt arrives late for PT   PT Stop Time 1100   PT Time Calculation (min) 34 min   Equipment Utilized During Treatment --   Activity Tolerance Patient tolerated treatment well   Behavior During Therapy [facility] for tasks assessed/performed      Past Medical History:  Diagnosis Date  . AAA (abdominal aortic aneurysm) ([place])   . Arthritis   . Cancer (HCC)    hx of skin cancer   . GERD (gastroesophageal reflux disease)   . Hx of hiatal hernia   . Hyperlipidemia   . Hypertension   . Nocturia   . PE (pulmonary embolism)   . Pulmonary embolism ([place]) 2011 and 2014   . Sigmoid diverticulosis     Past Surgical History:  Procedure Laterality Date  . ABDOMINAL AORTIC ANEURYSM REPAIR  2001   endovascular repair  . BACK SURGERY     laminectomy  . BACK SURGERY    . CATARACT EXTRACTION, BILATERAL    . CHOLECYSTECTOMY    . EXCISION/RELEASE BURSA HIP Right 07/15/2014   Procedure: RIGHT HIP BURSECTOMY WITH TENDON REPAIR ;  Surgeon: Gearlean Alf, MD;  Location: [facility];  Service: Orthopedics;  Laterality: Right;  . JOINT REPLACEMENT     bilateral knee replacement  . ROTATOR CUFF REPAIR     bilateral    There were no vitals filed for this visit.      Subjective Assessment - 10/15/16 1426    Subjective Did a few of the exercises over the weekend.   Patient Stated Goals "I don't have any goals."  Reports that he can't do things he once could (cutting the grass, unable to walk  long distances).  His spouse reports walking short and long distances are the biggest problem.  Unable to walk into [employer] without stopping multiple times.    Currently in Pain? Yes   Pain Score 4    Pain Location Back   Pain Orientation Lower   Pain Descriptors / Indicators Sore   Pain Type Chronic pain   Pain Onset More than a month ago   Pain Frequency Constant   Aggravating Factors  "doing the wrong thing aggravates   Pain Relieving Factors agrees that exercises help pain                         [facility] Adult PT Treatment/Exercise - 10/15/16 0001      Ambulation/Gait   Ambulation/Gait Yes   Ambulation/Gait Assistance 5: Supervision   Ambulation/Gait Assistance Details Pt c/o feeling of dizziness during gait.  Stops and then sits, with pt reporting dizziness has subsided after several minutes.  Upon clarification, pt reports feeling unsteady, not room-spinning dizziness.   Ambulation Distance (Feet) 381 Feet  in 3 minute walk with LOB trying to turn to sit   Assistive device None;Straight cane   Gait Pattern Decreased stride length;Decreased dorsiflexion - right;Decreased dorsiflexion - left;Poor foot  clearance - left;Poor foot clearance - right;Decreased arm swing - right;Decreased arm swing - left;Step-through pattern;Decreased trunk rotation   Ambulation Surface Level;Indoor   Gait velocity 13.96 sec = 2.34 ft/sec   Gait Comments Therapist assists patient regain balance upon LOB with turning to sit.  With last 50 ft of gait, pt demo decreased L foot clearance and pitches forward, with near fall.  2nd bout of gait 200 ft with cane       Additional 200 ft of gait no device, with close supervision, with cues for increased arm swing and increased foot clearance/heelstrike on LLE.  Therapeutic Exercise: Review of HEP:  Pt performs bridging x 10, hooklying marching x 10, trunk rotation R and L x 5 reps.  Pt able to perform independently.  Cues provided for technique to  hold stretch for supine hamstring stretch and for posture for seated hamstring stretch.  Self Care: -Briefly discussed [employer] class for people with Parkinson's currently in community.  Discussed that class may not ultimately be a good fit for patient due to patient's limitations for floor mobility due to joint and back pain.  Also, answered pt's question about continuing PT-pt needs to demonstrate ability for carryover for PT exercises and recommendations at home as well as progress with PT in order to continue.  Discussed likelihood of D/C next visit per primary PT.            PT Short Term Goals - 10/15/16 1030      PT SHORT TERM GOAL #1   Title The patient will perform HEP with family's supervision including balance, mobility, low back/core stabilization, and gaze training.   Baseline Target date 10/12/2016   Time 4   Period Weeks   Status Achieved     PT SHORT TERM GOAL #2   Title Patient will improve Berg balance score from 41/56 to > or equal to 45/56 to demo dec'ing risk for falls.   Baseline Improved to 45/56.   Time 4   Period Weeks   Status Achieved     PT SHORT TERM GOAL #3   Title Patient will improve gait speed from 2.0 ft/sec to > or equa lto 2.4 ft/sec to demo improving functional gait.   Baseline 2.34 ft/sec 10/15/16   Time 4   Period Weeks   Status Not Met     PT SHORT TERM GOAL #4   Title The patient will report back pain dec'd from 6/10 baseline to < or equal to 4/10.   Baseline Pt notes 4/10 back pain at rest.   Time 4   Period Weeks   Status Achieved     PT SHORT TERM GOAL #5   Title The patient will reduce 5 times sit<>stand from 17.37 seconds to < or equal to 15 seconds to demo improving functional mobility.   Baseline Increased to 18 seconds on 10/08/2016   Time 4   Period Weeks   Status Not Met           PT Long Term Goals - 09/12/16 1009      PT LONG TERM GOAL #1   Title Patient and family will verbalize understanding of community  resources for parkinson's disease.   Baseline Target date 11/11/2016   Time 8   Period Weeks     PT LONG TERM GOAL #2   Title Patient will improve gait speed from 2 ft/sec to > or equal to 2.7 ft/sec to demo transition to "full community ambulator" classification  of gait.   Baseline Target date 11/11/2016   Time 8   Period Weeks     PT LONG TERM GOAL #3   Title The patient will improve TUG from 14 seconds to < or equal to 13 seconds to demo dec'd fall risk.   Baseline Target date 11/11/2016   Time 8   Period Weeks     PT LONG TERM GOAL #4   Title Pt will ambulate at least 1000 ft indoor and outdoor surfaces, with supervision, device as needed, for improved community gait.   Baseline Target date 11/11/2016   Time 8   Period Weeks     PT LONG TERM GOAL #5   Title Patient will improve Berg from 41/56 to > or equal to 50/56 to demo improving functional balance.   Baseline Target date 11/11/2016   Time 8   Period Weeks               Plan - 10/15/16 1429    Clinical Impression Statement Pt appears to have more difficulty with L foot clearance today, with several episodes during gait of "dizziness" with walking, needing to stop walking.  Pt's walking overall does not appear as safe.  Pt continues to present without cane to PT sessions, and appears reluctant to be using cane at home.  Pt able to perform at least 50% of back exercises in HEP from memory today.  Brief discussion re: PD exercise classes-pt may not be appropriate, as he would not be able to fully participate in all positions for [employer] due to back and joint pain, also due to questionable carryover and participation in community exercise class based on pt's carryover with PT HEP.   PT Treatment/Interventions ADLs/Self Care Home Management;Gait training;Functional mobility training;Patient/family education;Therapeutic activities;Therapeutic exercise;Balance training;Neuromuscular re-education;Vestibular   PT Next Visit Plan  Discuss fall prevention, safety awareness with gait; likely discharge next visit.   Consulted and Agree with Plan of Care Patient   Family Member Consulted spouse, Jan came in at first portion of PT session and then at end of session.  She notes no carryover of exercise to home.      Patient will benefit from skilled therapeutic intervention in order to improve the following deficits and impairments:  Abnormal gait, Decreased balance, Postural dysfunction, Impaired flexibility, Difficulty walking, Dizziness  Visit Diagnosis: Other abnormalities of gait and mobility  Unsteadiness on feet     Problem List Patient Active Problem List   Diagnosis Date Noted  . PBA (pseudobulbar affect) 06/24/2016  . Vascular dementia 06/24/2016  . Greater trochanteric bursitis of right hip 07/15/2014  . Bursitis of hip 07/15/2014  . Pulmonary embolism ([place]) 10/01/2013    Class: Acute  . Thrombocytopenia ([place]) 10/01/2013    Class: Acute  . Hypertension 10/01/2013    Class: Chronic  . Abdominal aneurysm without mention of rupture 01/15/2012    Geraldyne Barraclough W. 10/15/2016, 2:33 PM  Frazier Butt., PT  [facility] [address] [place], [place], [address] Phone: [phone]   Fax:  [phone]  Name: Christian Wiley MRN: [id number] Date of Birth: 13-Aug-1935

## 2016-10-17 ENCOUNTER — Ambulatory Visit: Payer: PPO | Admitting: Rehabilitative and Restorative Service Providers"

## 2016-10-17 DIAGNOSIS — R2681 Unsteadiness on feet: Secondary | ICD-10-CM

## 2016-10-17 DIAGNOSIS — M6281 Muscle weakness (generalized): Secondary | ICD-10-CM

## 2016-10-17 DIAGNOSIS — R2689 Other abnormalities of gait and mobility: Secondary | ICD-10-CM

## 2016-10-17 NOTE — Therapy (Signed)
[facility] [address] [place], [place], [address] Phone: [phone]   Fax:  [phone]  Physical Therapy Treatment  Patient Details  Name: Christian Wiley MRN: [id number] Date of Birth: August 29, 1935 Referring Provider: Alonza Bogus, DO  Encounter Date: 10/17/2016      PT End of Session - 10/18/16 0723    Visit Number 9   Number of Visits 16   Date for PT Re-Evaluation 11/10/16   Authorization Type G code every 10th visit   PT Start Time 1018   PT Stop Time 1100   PT Time Calculation (min) 42 min   Activity Tolerance Patient tolerated treatment well   Behavior During Therapy [facility] for tasks assessed/performed      Past Medical History:  Diagnosis Date  . AAA (abdominal aortic aneurysm) ([place])   . Arthritis   . Cancer (HCC)    hx of skin cancer   . GERD (gastroesophageal reflux disease)   . Hx of hiatal hernia   . Hyperlipidemia   . Hypertension   . Nocturia   . PE (pulmonary embolism)   . Pulmonary embolism ([place]) 2011 and 2014   . Sigmoid diverticulosis     Past Surgical History:  Procedure Laterality Date  . ABDOMINAL AORTIC ANEURYSM REPAIR  2001   endovascular repair  . BACK SURGERY     laminectomy  . BACK SURGERY    . CATARACT EXTRACTION, BILATERAL    . CHOLECYSTECTOMY    . EXCISION/RELEASE BURSA HIP Right 07/15/2014   Procedure: RIGHT HIP BURSECTOMY WITH TENDON REPAIR ;  Surgeon: Gearlean Alf, MD;  Location: [facility];  Service: Orthopedics;  Laterality: Right;  . JOINT REPLACEMENT     bilateral knee replacement  . ROTATOR CUFF REPAIR     bilateral    There were no vitals filed for this visit.      Subjective Assessment - 10/17/16 1026    Subjective The patient reports he doesn't use SPC because he is not able to keep up with it (puts it down and loses it).  Patient notes he is exercising by picking leaves up in yard (raking    Patient Stated Goals "I don't have any goals."  Reports that  he can't do things he once could (cutting the grass, unable to walk long distances).  His spouse reports walking short and long distances are the biggest problem.  Unable to walk into [employer] without stopping multiple times.    Currently in Pain? --  none at rest today            [facility] PT Assessment - 10/17/16 1037      Berg Balance Test   Sit to Stand Able to stand without using hands and stabilize independently   Standing Unsupported Able to stand safely 2 minutes   Sitting with Back Unsupported but Feet Supported on Floor or Stool Able to sit safely and securely 2 minutes   Stand to Sit Sits safely with minimal use of hands   Transfers Able to transfer safely, minor use of hands   Standing Unsupported with Eyes Closed Able to stand 10 seconds safely   Standing Ubsupported with Feet Together Able to place feet together independently and stand 1 minute safely   From Standing, Reach Forward with Outstretched Arm Can reach forward >12 cm safely (5")   From Standing Position, Pick up Object from Floor Able to pick up shoe safely and easily   From Standing Position, Turn to  Look Behind Over each Shoulder Looks behind one side only/other side shows less weight shift   Turn 360 Degrees Able to turn 360 degrees safely in 4 seconds or less   Standing Unsupported, Alternately Place Feet on Step/Stool Able to stand independently and safely and complete 8 steps in 20 seconds   Standing Unsupported, One Foot in Front Able to take small step independently and hold 30 seconds   Standing on One Leg Able to lift leg independently and hold equal to or more than 3 seconds   Total Score 50   Berg comment: 50/56 improved from 41/56 at eval     Timed Up and Go Test   TUG --  12.94 seconds for TUG without device     THERAPEUTIC ACTIVITIES: Floor<>stand with UE support  SELF CARE/HOME MANAGEMENT: HEP instruction to continue current plan for low back pain mgmt, LE strengthening and general mobility Fall  prevention/home safety:  Uses bathroom light on at night, 2 steps coming out of home that he does well. Discussed recommended use of assistive device to improve mobility.  Gait: Ambulation x 400 feet nonstop with postural cues and verbal cues for heel strike.  Patient enters into shuffling step pattern as he fatigues and notes "weakness" in his legs that requires him to stop and rest. TUG (see above) Gait speed=2.27 ft/sec without a device with supervision.  NEUROMUSCULAR RE-EDUCATION: Berg(see above)         PT Education - 10/18/16 0722    Education provided Yes   Education Details Fall prevention, continuation of HEP, recommendations for use of single point cane.   Person(s) Educated Patient   Methods Explanation;Handout   Comprehension Verbalized understanding;Returned demonstration          PT Short Term Goals - 10/15/16 1030      PT SHORT TERM GOAL #1   Title The patient will perform HEP with family's supervision including balance, mobility, low back/core stabilization, and gaze training.   Baseline Target date 10/12/2016   Time 4   Period Weeks   Status Achieved     PT SHORT TERM GOAL #2   Title Patient will improve Berg balance score from 41/56 to > or equal to 45/56 to demo dec'ing risk for falls.   Baseline Improved to 45/56.   Time 4   Period Weeks   Status Achieved     PT SHORT TERM GOAL #3   Title Patient will improve gait speed from 2.0 ft/sec to > or equa lto 2.4 ft/sec to demo improving functional gait.   Baseline 2.34 ft/sec 10/15/16   Time 4   Period Weeks   Status Not Met     PT SHORT TERM GOAL #4   Title The patient will report back pain dec'd from 6/10 baseline to < or equal to 4/10.   Baseline Pt notes 4/10 back pain at rest.   Time 4   Period Weeks   Status Achieved     PT SHORT TERM GOAL #5   Title The patient will reduce 5 times sit<>stand from 17.37 seconds to < or equal to 15 seconds to demo improving functional mobility.   Baseline  Increased to 18 seconds on 10/08/2016   Time 4   Period Weeks   Status Not Met           PT Long Term Goals - 10/17/16 1035      PT LONG TERM GOAL #1   Title Patient and family will verbalize understanding of  community resources for parkinson's disease.   Baseline The patient has handout for POP group, however group exercise class may not be appropriate due to attending to task and safety.   Time 8   Period Weeks   Status Achieved     PT LONG TERM GOAL #2   Title Patient will improve gait speed from 2 ft/sec to > or equal to 2.7 ft/sec to demo transition to "full community ambulator" classification of gait.   Baseline 2.27 ft/sec on 10/17/2016   Time 8   Period Weeks   Status Not Met     PT LONG TERM GOAL #3   Title The patient will improve TUG from 14 seconds to < or equal to 13 seconds to demo dec'd fall risk.   Baseline 12.94 seconds on 10/17/2016   Time 8   Period Weeks   Status Achieved     PT LONG TERM GOAL #4   Title Pt will ambulate at least 1000 ft indoor and outdoor surfaces, with supervision, device as needed, for improved community gait.   Baseline Patient ambulates 400 ft (in 3.5 minutes) without device with close supervision and visual cues on level/indoor surfaces   Time 8   Period Weeks   Status Not Met     PT LONG TERM GOAL #5   Title Patient will improve Berg from 41/56 to > or equal to 50/56 to demo improving functional balance.   Baseline Met on 10/17/2016   Time 8   Period Weeks   Status Achieved               Plan - 10/18/16 0725    Clinical Impression Statement The patient met 3/5 LTGs.  He is not able to walk community distances and made minor changes to gait speed.  The patient continues to be a high fall risk per observation (although Berg and TUG scores demonstrate improvement into lower risk category) as he shuffles his feet and has dec'd foot clearance as he fatigues.  The patient has not carried over exercise to home environment, PT  encouraged him to work on activities provided.     PT Treatment/Interventions ADLs/Self Care Home Management;Gait training;Functional mobility training;Patient/family education;Therapeutic activities;Therapeutic exercise;Balance training;Neuromuscular re-education;Vestibular   PT Next Visit Plan Discharge   Consulted and Agree with Plan of Care Patient      Patient will benefit from skilled therapeutic intervention in order to improve the following deficits and impairments:  Abnormal gait, Decreased balance, Postural dysfunction, Impaired flexibility, Difficulty walking, Dizziness  Visit Diagnosis: Other abnormalities of gait and mobility  Unsteadiness on feet  Muscle weakness of lower extremity     Problem List Patient Active Problem List   Diagnosis Date Noted  . PBA (pseudobulbar affect) 06/24/2016  . Vascular dementia 06/24/2016  . Greater trochanteric bursitis of right hip 07/15/2014  . Bursitis of hip 07/15/2014  . Pulmonary embolism ([place]) 10/01/2013    Class: Acute  . Thrombocytopenia ([place]) 10/01/2013    Class: Acute  . Hypertension 10/01/2013    Class: Chronic  . Abdominal aneurysm without mention of rupture 01/15/2012    Tillman Kazmierski, PT 10/18/2016, 7:29 AM  [facility] [address] [place], [place], [address] Phone: [phone]   Fax:  [phone]  Name: KRAMER HANRAHAN MRN: [id number] Date of Birth: 01-17-1935

## 2016-10-17 NOTE — Patient Instructions (Signed)

## 2016-10-21 DIAGNOSIS — N401 Enlarged prostate with lower urinary tract symptoms: Secondary | ICD-10-CM | POA: Diagnosis not present

## 2016-10-21 DIAGNOSIS — G20 Parkinson's disease: Secondary | ICD-10-CM | POA: Diagnosis not present

## 2016-10-21 DIAGNOSIS — E039 Hypothyroidism, unspecified: Secondary | ICD-10-CM | POA: Diagnosis not present

## 2016-10-21 DIAGNOSIS — M1990 Unspecified osteoarthritis, unspecified site: Secondary | ICD-10-CM | POA: Diagnosis not present

## 2016-10-21 DIAGNOSIS — F1320 Sedative, hypnotic or anxiolytic dependence, uncomplicated: Secondary | ICD-10-CM | POA: Diagnosis not present

## 2016-10-21 DIAGNOSIS — M4726 Other spondylosis with radiculopathy, lumbar region: Secondary | ICD-10-CM | POA: Diagnosis not present

## 2016-10-21 DIAGNOSIS — E669 Obesity, unspecified: Secondary | ICD-10-CM | POA: Diagnosis not present

## 2016-10-21 DIAGNOSIS — G4730 Sleep apnea, unspecified: Secondary | ICD-10-CM | POA: Diagnosis not present

## 2016-10-21 DIAGNOSIS — F329 Major depressive disorder, single episode, unspecified: Secondary | ICD-10-CM | POA: Diagnosis not present

## 2016-10-21 DIAGNOSIS — Z6830 Body mass index (BMI) 30.0-30.9, adult: Secondary | ICD-10-CM | POA: Diagnosis not present

## 2016-10-21 DIAGNOSIS — Z7901 Long term (current) use of anticoagulants: Secondary | ICD-10-CM | POA: Diagnosis not present

## 2016-10-21 DIAGNOSIS — G629 Polyneuropathy, unspecified: Secondary | ICD-10-CM | POA: Diagnosis not present

## 2016-10-24 DIAGNOSIS — M79671 Pain in right foot: Secondary | ICD-10-CM | POA: Diagnosis not present

## 2016-10-24 DIAGNOSIS — M779 Enthesopathy, unspecified: Secondary | ICD-10-CM | POA: Diagnosis not present

## 2016-10-30 ENCOUNTER — Encounter: Payer: Self-pay | Admitting: Rehabilitative and Restorative Service Providers"

## 2016-10-30 NOTE — Therapy (Signed)
Christian Wiley [address] [place], [place], [address] Phone: [phone]   Fax:  [phone]  Patient Details  Name: Christian Wiley MRN: [id number] Date of Birth: 1935-08-13 Referring Provider:  No ref. provider found  Encounter Date: last encounter 10/17/2016  PHYSICAL THERAPY DISCHARGE SUMMARY  Visits from Start of Care: 9  Current functional level related to goals / functional outcomes:     PT Short Term Goals - 10/15/16 1030      PT SHORT TERM GOAL #1   Title The patient will perform HEP with family's supervision including balance, mobility, low back/core stabilization, and gaze training.   Baseline Target date 10/12/2016   Time 4   Period Weeks   Status Achieved     PT SHORT TERM GOAL #2   Title Patient will improve Berg balance score from 41/56 to > or equal to 45/56 to demo dec'ing risk for falls.   Baseline Improved to 45/56.   Time 4   Period Weeks   Status Achieved     PT SHORT TERM GOAL #3   Title Patient will improve gait speed from 2.0 ft/sec to > or equa lto 2.4 ft/sec to demo improving functional gait.   Baseline 2.34 ft/sec 10/15/16   Time 4   Period Weeks   Status Not Met     PT SHORT TERM GOAL #4   Title The patient will report back pain dec'd from 6/10 baseline to < or equal to 4/10.   Baseline Pt notes 4/10 back pain at rest.   Time 4   Period Weeks   Status Achieved     PT SHORT TERM GOAL #5   Title The patient will reduce 5 times sit<>stand from 17.37 seconds to < or equal to 15 seconds to demo improving functional mobility.   Baseline Increased to 18 seconds on 10/08/2016   Time 4   Period Weeks   Status Not Met         PT Long Term Goals - 10/17/16 1035      PT LONG TERM GOAL #1   Title Patient and family will verbalize understanding of community resources for parkinson's disease.   Baseline The patient has handout for POP group, however group exercise class may not be  appropriate due to attending to task and safety.   Time 8   Period Weeks   Status Achieved     PT LONG TERM GOAL #2   Title Patient will improve gait speed from 2 ft/sec to > or equal to 2.7 ft/sec to demo transition to "full community ambulator" classification of gait.   Baseline 2.27 ft/sec on 10/17/2016   Time 8   Period Weeks   Status Not Met     PT LONG TERM GOAL #3   Title The patient will improve TUG from 14 seconds to < or equal to 13 seconds to demo dec'd fall risk.   Baseline 12.94 seconds on 10/17/2016   Time 8   Period Weeks   Status Achieved     PT LONG TERM GOAL #4   Title Pt will ambulate at least 1000 ft indoor and outdoor surfaces, with supervision, device as needed, for improved community gait.   Baseline Patient ambulates 400 ft (in 3.5 minutes) without device with close supervision and visual cues on level/indoor surfaces   Time 8   Period Weeks   Status Not Met     PT LONG TERM GOAL #5   Title  Patient will improve Berg from 41/56 to > or equal to 50/56 to demo improving functional balance.   Baseline Met on 10/17/2016   Time 8   Period Weeks   Status Achieved        Remaining deficits: Abnormality of gait Postural abnormality LE weakness Imbalance   Education / Equipment: HEP, home safety, use of assistive device, fall prevention.  Plan: Patient agrees to discharge.  Patient goals were partially met. Patient is being discharged due to meeting the stated rehab goals.  ?????        Thank you for the referral of this patient. Rudell Cobb, MPT  Lon Klippel 10/30/2016, 8:43 AM  [facility] [address] [place], [place], [address] Phone: [phone]   Fax:  [phone]

## 2016-11-11 NOTE — Addendum Note (Signed)
Addended by: Lianne Cure A on: 11/11/2016 11:31 AM   Modules accepted: Orders

## 2016-11-14 DIAGNOSIS — N401 Enlarged prostate with lower urinary tract symptoms: Secondary | ICD-10-CM | POA: Diagnosis not present

## 2016-11-14 DIAGNOSIS — R310 Gross hematuria: Secondary | ICD-10-CM | POA: Diagnosis not present

## 2016-11-14 DIAGNOSIS — R351 Nocturia: Secondary | ICD-10-CM | POA: Diagnosis not present

## 2016-11-18 DIAGNOSIS — R310 Gross hematuria: Secondary | ICD-10-CM | POA: Diagnosis not present

## 2016-11-18 DIAGNOSIS — N201 Calculus of ureter: Secondary | ICD-10-CM | POA: Diagnosis not present

## 2016-11-22 NOTE — Progress Notes (Signed)
Christian Wiley was seen today in the movement disorders clinic for neurologic consultation at the request of [name],[name], MD.  The consultation is for the evaluation of gait change and to r/o PD.  This patient is accompanied in the office by his spouse who supplements the history.   Pt states that he has been seen by his chiropractor, [name], for LBP and she recommended he get evaluated by a neurologist as she thought he had parkinsonian features.    09/19/15 update:  Pt returns for f/u accompanied by his wife who supplements the hx.  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  We decided to give him a trial of levodopa.  Today, the patient states that he is only taking it bid instead of the prescribed tid.  He is usually taking it at 10am and 5pm.  Tremor is improved.   He did have an MRI of the brain without gadolinium on 06/28/2015.  There was no significant change compared to his prior MRI of the brain in 2007.  There is evidence of mild small vessel disease.  PT was refused last visit.  He is c/o fatigue.  Has taken his carbidopa/levodopa 25/100 this AM but has not taken his bystolic.  12/21/15 update:  The patient is following up today, accompanied by his wife who supplements the history.  The patient has history of vascular parkinsonism.  Last visit, he was only taking his levodopa twice per day, and I encouraged him to take it 3 times per day as directed.  His wife states that he still doesn't get his midday dose in time.  He was supposed to be taking the medication at 10am (wake up time)/2 pm/6pm.  He has not wanted to attend physical therapy, but continues to exercise at the gym.  He has not had any falls, but does admit that he "slid" out of the bed trying to get out (got new higher mattress).  No hallucinations.  He has had episodes of confusion and will sometimes ask his wife, "who will keep the kids."  He stopped xanax about a week ago and confusion seems  better.  He takes 2 hydrocodone at bedtime for sleep.  That hasn't helped sleep but he is resistant to giving up the medication. No lightheadedness or near syncope.  03/21/16 update:  The patient follows up today, accompanied by his wife who supplements the history.  The patient has a history of vascular parkinsonism.  Last visit, I again encouraged the patient to take his carbidopa/levodopa 3 times per day as opposed to the twice per day that he was previously taking it.  He did set an alarm but is still inconsistent with that.   He did attend physical therapy since our last visit, and I congratulated him on that.  Last visit, we talked about memory loss and I thought that medications contribute, particularly the hydrocodone and I encouraged him to decrease that to 1 tablet per night and add melatonin for sleep.  He did decrease the hydrocodone.  The melatonin didn't help him sleep.  Wife states that memory has been stable but still has trouble with telling the day.  That is not new.  07/23/16 update:  The patient follows up today, accompanied by his wife who supplements the history.  He has a history of vascular parkinsonism and he is supposed to be on carbidopa/levodopa 25/100, one tablet 3 times per day .  He almost always misses the middle  of the day dose of levodopa despite an alarm.  He denies any falls since last visit.  Has dizziness with standing but no near syncope.  Only drinking 8oz of water per day.  No hallucinations.  He does have history of memory loss, and likely vascular dementia.  He has refused any medication for this.  Memory loss is likely exacerbated by medication such as hydrocodone.  He saw [name] since our last visit and had a nocturnal polysomnogram on 06/04/2016.  His apnea-hypopnea index was 8.1.  She did prescribe clonazepam for periodic limb movements, which she felt was consistent with his diagnosis of vascular parkinsonism.  Wife states that Friday pt saw the NP at PCP  office for crying spells and she put him on neudexta.  Only been on it for 3 days.  He is still on lexapro from [name].  Pt cannot state if crying spells associated with sadness but wife thinks that they are associated with sadness.  11/26/16 update:  The patient was up today, limited by his wife who supplements the history.  The patient is supposed to be carbidopa/levodopa 25/100 at 10 AM/2 PM/6 PM, but generally only takes the first and last dose.  He has had one fall while working on [employer].  He was pulling on the crow bar and fell over while pulling on it.  He didn't get hurt.   Some lightheadedness but no syncope.  His nurse practitioner placed him on Nuedexta and his wife states that they d/c that after our last visit.  He is also on Lexapro and wellbutrin was added by the PCP 2 months ago.  His wife states that crying spells improved but she isn't sure If that is when hallucinations started or not.  She came with a long list of issues that she wrote for me.  Pt thought last week that there was a boy under the bed and got a flashlight to look under the bed to look (this happened at about 4-5 am).  He denies other hallucinations, although his wife rates in her note that he will ask her what they're going to feet all the people at her house.  After awakening from a nap, he is asking for granddaughter who lives in [place] and will state that "she was here earlier."   On one occasion, the patient's wife woke up to find the patient looking for a pistol in the closet and when she asked him what he was doing he stated that he heard someone trying to get into the house.  On another occasion he her dog scratching at the door and when she got up and looked, she stated that she knew he had been up earlier because keys were in the door.  He did attend physical/occupational therapy in November.  I have reviewed those records.  He is no longer driving after an incident in which he was supposed to be driving  himself to therapy and then would go to his primary care physician.  It turns out, that he did go to therapy, but he then went to the dentist and they told him he did not have an appointment.  He never made it to the primary care doctor.  He tried to call his wife but he did not dial the area code, so the call did not go through.  He ultimately ended up at his house several hours later.  Last labs 2 weeks ago due to blood in urine.  Saw urology and has kidney stone.    Neuroimaging has previously been performed.  It is not available for my review today.  States that he had a CT brain a few years ago after a fall.      ALLERGIES:   Allergies  Allergen Reactions  . Ambien [Zolpidem Tartrate]     Goes wild, gets anxious and confused with ambien    CURRENT MEDICATIONS:  Outpatient Encounter Prescriptions as of 11/26/2016  Medication Sig  . allopurinol (ZYLOPRIM) 300 MG tablet Take 300 mg by mouth every morning.   Marland Kitchen amoxicillin (AMOXIL) 500 MG capsule Take 500 mg by mouth as needed. For dental work  . atorvastatin (LIPITOR) 40 MG tablet Take 1 tablet by mouth at bedtime.   Marland Kitchen buPROPion (WELLBUTRIN) 100 MG tablet Take 100 mg by mouth 2 (two) times daily. Patient's spouse uncertain of dosage amount, thinks it may be 100mg   . carbidopa-levodopa (SINEMET IR) 25-100 MG tablet TAKE 1 TABLET BY MOUTH 3 (THREE) TIMES DAILY.  Marland Kitchen escitalopram (LEXAPRO) 10 MG tablet Take 10 mg by mouth daily.  Marland Kitchen HYDROcodone-acetaminophen (NORCO/VICODIN) 5-325 MG tablet TAKE 2 TABLETs EVERY 4 HOURS AS NEEDED FOR PAIN  . levothyroxine (SYNTHROID, LEVOTHROID) 25 MCG tablet Take 25 mcg by mouth daily.  . meclizine (ANTIVERT) 25 MG tablet Take 25 mg by mouth 2 (two) times daily as needed for dizziness.   . nebivolol (BYSTOLIC) 10 MG tablet Take 10 mg by mouth every morning.  . Rivaroxaban (XARELTO) 20 MG TABS tablet Take 1 tablet (20 mg total) by mouth daily with supper.  . temazepam (RESTORIL) 7.5 MG capsule   . amLODipine  (NORVASC) 5 MG tablet Take 2.5 mg by mouth daily.  . clonazepam (KLONOPIN) 0.125 MG disintegrating tablet Take 1 tablet (0.125 mg total) by mouth at bedtime. (Patient not taking: Reported on 11/26/2016)  . Dextromethorphan-Quinidine (NUEDEXTA) 20-10 MG CAPS Take by mouth.   No facility-administered encounter medications on file as of 11/26/2016.     PAST MEDICAL HISTORY:   Past Medical History:  Diagnosis Date  . AAA (abdominal aortic aneurysm) ([place])   . Arthritis   . Cancer ([facility])    hx of skin cancer   . GERD (gastroesophageal reflux disease)   . Hx of hiatal hernia   . Hyperlipidemia   . Hypertension   . Nocturia   . PE (pulmonary embolism)   . Pulmonary embolism ([place] [facility]) 2011 and 2014   . Sigmoid diverticulosis     PAST SURGICAL HISTORY:   Past Surgical History:  Procedure Laterality Date  . ABDOMINAL AORTIC ANEURYSM REPAIR  2001   endovascular repair  . BACK SURGERY     laminectomy  . BACK SURGERY    . CATARACT EXTRACTION, BILATERAL    . CHOLECYSTECTOMY    . EXCISION/RELEASE BURSA HIP Right 07/15/2014   Procedure: RIGHT HIP BURSECTOMY WITH TENDON REPAIR ;  Surgeon: Gearlean Alf, MD;  Location: [facility];  Service: Orthopedics;  Laterality: Right;  . JOINT REPLACEMENT     bilateral knee replacement  . ROTATOR CUFF REPAIR     bilateral    SOCIAL HISTORY:   Social History   Social History  . Marital status: Married    Spouse name: N/A  . Number of children: N/A  . Years of education: N/A   Occupational History  . Retired     [employer], parts dept   Social History Main Topics  . Smoking status: Former Smoker    Quit date: 10/01/1974  .  Smokeless tobacco: Never Used  . Alcohol use No  . Drug use: No  . Sexual activity: Not Currently   Other Topics Concern  . Not on file   Social History Narrative   Married   2 daughters, 1 son    FAMILY HISTORY:   Family Status  Relation Status  . Mother Deceased   alzheimer dementia  . Father Deceased   CAD   . Brother Alive   healthy  . Sister Deceased   breast CA  . Sister Alive   DJD  . Child Alive   3, healthy  . Sister   . Brother   . Daughter     ROS:  A complete 10 system review of systems was obtained and was unremarkable apart from what is mentioned above.  PHYSICAL EXAMINATION:    VITALS:   Vitals:   11/26/16 1047  BP: 100/60  Pulse: 68  SpO2: 95%  Weight: 229 lb 3 oz (104 kg)  Height: 6' (1.829 m)    GEN:  The patient appears stated age and is in NAD. HEENT:  Normocephalic, atraumatic.  The mucous membranes are moist. The superficial temporal arteries are without ropiness or tenderness. CV:  Bradycardic.  Regular rhythm Lungs:  CTAB Neck/HEME:  There are no carotid bruits bilaterally.  Neurological examination:  Orientation:  Montreal Cognitive Assessment  03/21/2016  Visuospatial/ Executive (0/5) 1  Naming (0/3) 2  Attention: Read list of digits (0/2) 2  Attention: Read list of letters (0/1) 1  Attention: Serial 7 subtraction starting at 100 (0/3) 1  Language: Repeat phrase (0/2) 1  Language : Fluency (0/1) 1  Abstraction (0/2) 1  Delayed Recall (0/5) 1  Orientation (0/6) 3  Total 14  Adjusted Score (based on education) 15    Cranial nerves: There is good facial symmetry. Pupils are equal round and reactive to light bilaterally. Fundoscopic exam reveals clear margins bilaterally. Extraocular muscles are intact. The visual fields are full to confrontational testing. The speech is fluent and clear. Soft palate rises symmetrically and there is no tongue deviation. Hearing is intact to conversational tone. Sensation: Sensation is intact to light touch throughout Motor: Strength is 5/5 in the bilateral upper and lower extremities.   Shoulder shrug is equal and symmetric.  There is no pronator drift.   Movement examination: Tone: There is normal tone in the bilateral upper extremities.  The tone in the lower extremities is normal.  Abnormal movements: There  is RUE resting tremor Coordination:  There is no decremation with RAM's Gait and Station: The patient is mildly short stepped with decreased arm swing on the left.    ASSESSMENT/PLAN:  1.  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  Has set an alarm for taking carbidopa/levodopa 25/100 at 10 AM/2 PM/6 PM but still only takes 2 per day.  -needs to increase water intake greatly to help the lightheadedness.   2.  dementia, likely vascular with new onset hallucinations  -MoCA 15 on 03/21/16.  Doesn't want to add more medication.  Needs more physical exercise and discussed  -told no more driving.  Told not to renew drivers license in a few weeks.  Pts wife asks if can drive if she is with him and told no.  Got lost while driving and don't want him driving any more.  Too much of safety risk on road.   3.  Periodic limb movement disorder and very mild sleep apnea  -  The patient saw [name] and had a sleep study on 06/04/2016.  AHI was only 8.1 and CPAP was not recommended.  The patient should sleep in the side-lying position.  She did recommend low-dose clonazepam at night. 4.  LBP  -The patient has seen Dr. Carloyn Manner and Dr. Nelva Bush along with chiropractics.  . While I did see he had an MRI of his lumbar spine in 2014 that demonstrated moderate to severe neural foraminal stenosis at L4-L5 and significant degenerative changes, they are not interested in further surgery or injections and have failed alternative (chiropractic) tx.  Sports med (Dr. Tamala Julian) may be of value but will leave that to discretion of PCP. 5.  Depression  -on Lexapro, 10 mg daily  -on wellbutrin but wife not sure if this is the SLR or the XL.  She also thinks that perhaps his confusion/hallucinations started after the Wellbutrin but she is not sure.  He is certainly at risk for hallucinations given the dementia.  We decided to hold the Wellbutrin for one week and if no better,  then decided to start Nuplazid.  We did discuss risks, benefits, and side effects including the black box warning.  I will try to get a copy of his latest EKG from his primary care physician to assess the QT interval. 6.  Lightheadedness  -BP is low.  Following with PCP.   7.  Follow up is anticipated in the next 4 months, sooner should new neurologic issues arise.  Much greater than 50% of this visit was spent in counseling with the patient and the wife.  Total face to face time:  40 min

## 2016-11-26 ENCOUNTER — Encounter: Payer: Self-pay | Admitting: Neurology

## 2016-11-26 ENCOUNTER — Telehealth: Payer: Self-pay | Admitting: Neurology

## 2016-11-26 ENCOUNTER — Ambulatory Visit (INDEPENDENT_AMBULATORY_CARE_PROVIDER_SITE_OTHER): Payer: PPO | Admitting: Neurology

## 2016-11-26 VITALS — BP 100/60 | HR 68 | Ht 72.0 in | Wt 229.2 lb

## 2016-11-26 DIAGNOSIS — R443 Hallucinations, unspecified: Secondary | ICD-10-CM

## 2016-11-26 DIAGNOSIS — G214 Vascular parkinsonism: Secondary | ICD-10-CM

## 2016-11-26 DIAGNOSIS — F01518 Vascular dementia, unspecified severity, with other behavioral disturbance: Secondary | ICD-10-CM

## 2016-11-26 DIAGNOSIS — F0151 Vascular dementia with behavioral disturbance: Secondary | ICD-10-CM

## 2016-11-26 NOTE — Telephone Encounter (Signed)
Patient is taking Bupropionhcl xl 150mg  1 daily his wife wants someone to call her at [phone] she states that the medication list is wrong

## 2016-11-26 NOTE — Patient Instructions (Signed)
1.  Call me with wellbutrin dose and whether it is SR or XL 2.  Hold wellbutrin for a week 3.  We will call you in a week and if hallucinations/cognition no better, we will start nuplazid

## 2016-11-26 NOTE — Telephone Encounter (Signed)
Spoke with patient's wife and confirmed he is taking Wellbutrin XL 150 mg QD.  I updated med list. He will still stop medication and I will call next week to see how he is doing.  Dr. Reather Laurence.

## 2016-11-27 ENCOUNTER — Telehealth: Payer: Self-pay | Admitting: Neurology

## 2016-11-27 MED ORDER — CARBIDOPA-LEVODOPA 25-100 MG PO TABS: ORAL_TABLET | ORAL | 5 refills | Status: DC

## 2016-11-27 NOTE — Telephone Encounter (Signed)
Patient needs carbidopa levodopa 25-100mg   Called into the [facility] patient phone number is [phone]

## 2016-11-29 DIAGNOSIS — N202 Calculus of kidney with calculus of ureter: Secondary | ICD-10-CM | POA: Diagnosis not present

## 2016-11-29 DIAGNOSIS — N201 Calculus of ureter: Secondary | ICD-10-CM | POA: Diagnosis not present

## 2016-12-05 ENCOUNTER — Telehealth: Payer: Self-pay | Admitting: Neurology

## 2016-12-05 ENCOUNTER — Other Ambulatory Visit: Payer: Self-pay | Admitting: Urology

## 2016-12-05 NOTE — Telephone Encounter (Signed)
Spoke with patient's wife to see how he is doing off Wellbutrin.  She states he hasn't had the crying spells he was having prior to being placed on medication. He has had some emotional times, but it could be related to the time of year and didn't think she got an accurate representation.  His confusion has seemed better. His hallucinations would normally present as confusion to her with him asking questions like, "who is here?" "What are we going to feed all these people?" "Where is our granddaughter?". He only mentioned one time over the last week about some of the neighbors lights being shaped like a cat, when they weren't. Other than this incident he hasn't mentioned any other episodes of hallucination.  He was instructed to stay off Wellbutrin for now and Dr. Carles Collet would address any medication adjustments next week.  Patient's wife also wanted to make [place] aware that he does have a kidney stone that he has to undergo lithotripsy for - they are waiting to hear from surgery scheduling.

## 2016-12-10 ENCOUNTER — Telehealth: Payer: Self-pay | Admitting: Neurology

## 2016-12-10 DIAGNOSIS — F329 Major depressive disorder, single episode, unspecified: Secondary | ICD-10-CM | POA: Diagnosis not present

## 2016-12-10 NOTE — Telephone Encounter (Signed)
EKG received and was done on 09/19/2015 and QTc normal at 458 but EKG over a year old.

## 2016-12-18 ENCOUNTER — Encounter (HOSPITAL_BASED_OUTPATIENT_CLINIC_OR_DEPARTMENT_OTHER): Payer: Self-pay | Admitting: *Deleted

## 2016-12-19 ENCOUNTER — Encounter (HOSPITAL_BASED_OUTPATIENT_CLINIC_OR_DEPARTMENT_OTHER): Payer: Self-pay | Admitting: *Deleted

## 2016-12-19 NOTE — Progress Notes (Signed)
SPOKE W/ PT'S WIFE, JANE.  PT HAS DEMENTIA , WILL NEED WIFE AT PRE-OP.  NPO AFTER MN.  ARRIVE AT 0600.  NEEDS ISTAT AND EKG.  WILL TAKE AM MEDS W/ SIPS OF WATER DOS.  PER WIFE WAS TOLD FROM OFFICE TO STOP XARELTO 2 DAYS PRIOR TO DOS.

## 2016-12-23 NOTE — H&P (Signed)
Office Visit Report     11/29/2016   --------------------------------------------------------------------------------   Christian Wiley  MRN: [id number]  PRIMARY CARE:  [name]. [name], MD  DOB: 08/21/1935, 82 year old Male  REFERRING:    SSN: [id number]  PROVIDER:  Festus Aloe, M.D.    TREATING:  Jiles Crocker    LOCATION:  [facility] [phone]   --------------------------------------------------------------------------------   CC/HPI: F/u today for a distal left ureteral calculi identified on CT imaging on approx 10 days ago. There was no hydronephrosis. Since last evaluation He has not had any exacerbation of lower urinary tract symptoms or experienced any debilitating renal colic. There is also not been any gross hematuria or fevers. Denies any stone passage. Patient states having a past medical history of kidney stones but never requiring any procedural intervention.     ALLERGIES: No Allergies    MEDICATIONS: Levothyroxine Sodium  Allopurinol TABS Oral  Atorvastatin Calcium 40 MG Oral Tablet Oral  Bupropion Hcl  Bystolic 5 MG Oral Tablet Oral  Carbidopa-Levodopa  Escitalopram Oxalate  Hydrocodone-Acetaminophen  Temazepam  Xarelto     GU PSH: Locm 300-399Mg /Ml Iodine,1Ml - 11/18/2016      PSH Notes: Back Surgery, Back Surgery, Heart Surgery, Cholecystectomy, Knee Replacement, Back Surgery   NON-GU PSH: Abdominal Aortic Aneurysm Repair Back Surgery (Unspecified) Cholecystectomy - 2008 Revise Knee Joint - 2008 Shoulder Surgery (Unspecified), Bilateral    GU PMH: BPH w/LUTS - 11/14/2016 Gross hematuria - 11/14/2016 ED, arterial insufficiency, Erectile dysfunction due to arterial insufficiency - 2014 Elevated PSA, PSA,Elevated - 2014, Elevated prostate specific antigen (PSA), - 2014 Enuresis, Enuresis, Psychological - 2014 Nocturia, Nocturia - 2014 Obstructive and reflux uropathy, Unspec, Obstructive uropathy - 2014 PIN, PIN (prostatic  intraepithelial neoplasia) - 2014 Kidney Stone      PMH Notes:  1898-12-09 00:00:00 - Note: Normal Routine History And Physical Senior Citizen [phone]  2010-03-21 09:47:58 - Note: Pulmonary Embolism  2007-09-11 16:01:35 - Note: Arthritis   NON-GU PMH: Personal history of other diseases of the circulatory system, History of hypertension - 2014 Personal history of other endocrine, nutritional and metabolic disease, History of hypercholesterolemia - 2014 Personal history of other mental and behavioral disorders, History of depression - 2014 Anxiety Arthritis Depression GERD Gout Hypercholesterolemia Hypertension Pulmonary Embolism, History    FAMILY HISTORY: 1 son - Other 2 daughters - Other Alzheimer's Disease - Mother Breast Cancer - Sister Cancer - Brother Death In The Family Father - Runs In Family Family Health Status Number - Runs In Family nephrolithiasis - Runs In Family   SOCIAL HISTORY: Marital Status: Married Current Smoking Status: Patient does not smoke anymore.  Drinks 2 caffeinated drinks per day.     Notes: Alcohol Use, Tobacco Use, Occupation:, Caffeine Use, Marital History - Currently Married   REVIEW OF SYSTEMS:    GU Review Male:   Patient reports frequent urination, hard to postpone urination, get up at night to urinate, leakage of urine, stream starts and stops, and trouble starting your stream. Patient denies burning/ pain with urination, have to strain to urinate , erection problems, and penile pain.  Gastrointestinal (Upper):   Patient denies nausea, indigestion/ heartburn, and vomiting.  Gastrointestinal (Lower):   Patient reports constipation. Patient denies diarrhea.  Constitutional:   Patient reports fatigue. Patient denies fever, night sweats, and weight loss.  Skin:   Patient denies skin rash/ lesion and itching.  Eyes:   Patient denies blurred vision and double vision.  Ears/ Nose/ Throat:  Patient denies sore throat and sinus problems.   Hematologic/Lymphatic:   Patient reports easy bruising. Patient denies swollen glands.  Cardiovascular:   Patient denies leg swelling and chest pains.  Respiratory:   Patient denies cough and shortness of breath.  Endocrine:   Patient denies excessive thirst.  Musculoskeletal:   Patient reports back pain and joint pain.   Neurological:   Patient denies headaches and dizziness.  Psychologic:   Patient reports depression and anxiety.    VITAL SIGNS:      11/29/2016 09:16 AM  Weight 228 lb / 103.42 kg  Height 73 in / 185.42 cm  BP 124/72 mmHg  Pulse 61 /min  Temperature 97.8 F / 37 C  BMI 30.1 kg/m   MULTI-SYSTEM PHYSICAL EXAMINATION:    Constitutional: Well-nourished. No physical deformities. Normally developed. Good grooming.  Respiratory: No labored breathing, no use of accessory muscles.   Cardiovascular: Normal temperature, normal extremity pulses, no swelling, no varicosities.  Lymphatic: No enlargement of neck, axillae, groin.  Skin: No paleness, no jaundice, no cyanosis. No lesion, no ulcer, no rash.  Neurologic / Psychiatric: Oriented to time, oriented to place, oriented to person. No depression, no anxiety, no agitation.  Gastrointestinal: No mass, no tenderness, no rigidity, non obese abdomen. No flank tenderness.  Musculoskeletal: Spine, ribs, pelvis no bilateral tenderness. Normal gait and station of head and neck.     PAST DATA REVIEWED:  Source Of History:  Patient  Records Review:   Previous Patient Records  Urine Test Review:   Urinalysis  X-Ray Review: C.T. Abdomen/Pelvis: Reviewed Films.     09/11/12 09/14/11 09/12/11 03/21/10 03/28/09 03/29/08 09/14/07  PSA  Total PSA 1.65  0.93  0.94  1.60  1.79  4.12  4.21   Free PSA      0.87  0.90   % Free PSA      21.1  21.4     11/29/16  Urinalysis  Urine Appearance Cloudy   Urine Color Amber   Urine Glucose Neg   Urine Bilirubin Neg   Urine Ketones Neg   Urine Specific Gravity 1.020   Urine Blood 3+   Urine  pH 5.5   Urine Protein 1+   Urine Urobilinogen 1.0   Urine Nitrites Neg   Urine Leukocyte Esterase Neg   Urine WBC/hpf NS (Not Seen)   Urine RBC/hpf 40 - 60/hpf   Urine Epithelial Cells 0 - 5/hpf   Urine Bacteria NS (Not Seen)   Urine Mucous Not Present   Urine Yeast NS (Not Seen)   Urine Trichomonas Not Present   Urine Cystals NS (Not Seen)   Urine Casts NS (Not Seen)   Urine Sperm Not Present    PROCEDURES:         KUB - 74000  A single view of the abdomen is obtained.  Bony Abnormalities:  Pelvic blasts. Lumbar arthritis.  Fecal Stasis:  Mild fecal stasis.  Calculi:  Upper pole left kidney calculi. Lower pole left kidney calculi. Upper pole right kidney calculi. Right largest calculi measures: 0.7 cm x 1.5 cm in right renal collecting system. Left distal ureter calculi 16mm x 7.35mm.               Urinalysis w/Scope Dipstick Dipstick Cont'd Micro  Color: Amber Bilirubin: Neg WBC/hpf: NS (Not Seen)  Appearance: Cloudy Ketones: Neg RBC/hpf: 40 - 60/hpf  Specific Gravity: 1.020 Blood: 3+ Bacteria: NS (Not Seen)  pH: 5.5 Protein: 1+ Cystals: NS (Not Seen)  Glucose: Neg  Urobilinogen: 1.0 Casts: NS (Not Seen)    Nitrites: Neg Trichomonas: Not Present    Leukocyte Esterase: Neg Mucous: Not Present      Epithelial Cells: 0 - 5/hpf      Yeast: NS (Not Seen)      Sperm: Not Present    ASSESSMENT:      ICD-10 Details  1 GU:   Calculus Ureter - N20.1 Left  2   Kidney Stone - N20.0 Bilateral   PLAN:           Orders Labs Urine Culture and Sensitivity          Schedule Return Visit/Planned Activity: Other See Visit Notes - Schedule Surgery          Document Letter(s):  Created for Patient: Clinical Summary         Notes:   Pt is mostly without bothersome symptoms, afebrile. I discussed the options for managing obstructing kidney stones. He understands that her stone is likely to increase in size and that over time this may cause obstructing to part of his kidney  resulting in decreased renal function. I went over conservative management consisting of surveillance with serial imaging with KUB and renal ultrasound to assess for interval growth. We also discussed more aggressive interventions including Shockwave lithotripsy and ureteroscopy. I briefly explained to him what the different treatment modalities consisted of, and what he might expect with each treatment.   Calculus in distal left ureter appears unchanged. I will discuss with his urologist about appropriate plan of care moving forward. Follow-up instructions given to him and his wife regarding worsening symptoms including fevers, uncontrollable pain, and painful inability to void.    * Signed by Jiles Crocker on 11/29/16 at 9:36 AM (EST)*     The information contained in this medical record document is considered private and confidential patient information. This information can only be used for the medical diagnosis and/or medical services that are being provided by the patient's selected caregivers. This information can only be distributed outside of the patient's care if the patient agrees and signs waivers of authorization for this information to be sent to an outside source or route.  Add: Urine Cx was negative - also a right 6 mm UPJ stone.

## 2016-12-24 ENCOUNTER — Encounter (HOSPITAL_BASED_OUTPATIENT_CLINIC_OR_DEPARTMENT_OTHER): Payer: Self-pay | Admitting: *Deleted

## 2016-12-24 ENCOUNTER — Ambulatory Visit (HOSPITAL_BASED_OUTPATIENT_CLINIC_OR_DEPARTMENT_OTHER)
Admission: RE | Admit: 2016-12-24 | Discharge: 2016-12-24 | Disposition: A | Discharge status: Home / Self Care | Payer: PPO | Source: Ambulatory Visit | Attending: Urology | Admitting: Urology

## 2016-12-24 ENCOUNTER — Ambulatory Visit (HOSPITAL_BASED_OUTPATIENT_CLINIC_OR_DEPARTMENT_OTHER): Payer: PPO | Admitting: Anesthesiology

## 2016-12-24 ENCOUNTER — Other Ambulatory Visit: Payer: Self-pay

## 2016-12-24 ENCOUNTER — Encounter (HOSPITAL_BASED_OUTPATIENT_CLINIC_OR_DEPARTMENT_OTHER)
Admission: RE | Disposition: A | Discharge status: Home / Self Care | Payer: Self-pay | Source: Ambulatory Visit | Attending: Urology

## 2016-12-24 DIAGNOSIS — N202 Calculus of kidney with calculus of ureter: Secondary | ICD-10-CM | POA: Insufficient documentation

## 2016-12-24 DIAGNOSIS — E039 Hypothyroidism, unspecified: Secondary | ICD-10-CM | POA: Diagnosis not present

## 2016-12-24 DIAGNOSIS — F329 Major depressive disorder, single episode, unspecified: Secondary | ICD-10-CM | POA: Diagnosis not present

## 2016-12-24 DIAGNOSIS — Z87891 Personal history of nicotine dependence: Secondary | ICD-10-CM | POA: Diagnosis not present

## 2016-12-24 DIAGNOSIS — N201 Calculus of ureter: Secondary | ICD-10-CM | POA: Diagnosis not present

## 2016-12-24 DIAGNOSIS — E7800 Pure hypercholesterolemia, unspecified: Secondary | ICD-10-CM | POA: Diagnosis not present

## 2016-12-24 DIAGNOSIS — K219 Gastro-esophageal reflux disease without esophagitis: Secondary | ICD-10-CM | POA: Insufficient documentation

## 2016-12-24 DIAGNOSIS — M109 Gout, unspecified: Secondary | ICD-10-CM | POA: Insufficient documentation

## 2016-12-24 DIAGNOSIS — M1990 Unspecified osteoarthritis, unspecified site: Secondary | ICD-10-CM | POA: Insufficient documentation

## 2016-12-24 DIAGNOSIS — Z86711 Personal history of pulmonary embolism: Secondary | ICD-10-CM | POA: Insufficient documentation

## 2016-12-24 DIAGNOSIS — Z87442 Personal history of urinary calculi: Secondary | ICD-10-CM | POA: Diagnosis not present

## 2016-12-24 DIAGNOSIS — D696 Thrombocytopenia, unspecified: Secondary | ICD-10-CM | POA: Diagnosis not present

## 2016-12-24 DIAGNOSIS — N400 Enlarged prostate without lower urinary tract symptoms: Secondary | ICD-10-CM | POA: Insufficient documentation

## 2016-12-24 DIAGNOSIS — N362 Urethral caruncle: Secondary | ICD-10-CM | POA: Insufficient documentation

## 2016-12-24 DIAGNOSIS — N358 Other urethral stricture: Secondary | ICD-10-CM | POA: Diagnosis not present

## 2016-12-24 DIAGNOSIS — F419 Anxiety disorder, unspecified: Secondary | ICD-10-CM | POA: Diagnosis not present

## 2016-12-24 DIAGNOSIS — I2699 Other pulmonary embolism without acute cor pulmonale: Secondary | ICD-10-CM | POA: Diagnosis not present

## 2016-12-24 DIAGNOSIS — I739 Peripheral vascular disease, unspecified: Secondary | ICD-10-CM | POA: Diagnosis not present

## 2016-12-24 DIAGNOSIS — I10 Essential (primary) hypertension: Secondary | ICD-10-CM | POA: Insufficient documentation

## 2016-12-24 DIAGNOSIS — N402 Nodular prostate without lower urinary tract symptoms: Secondary | ICD-10-CM | POA: Diagnosis not present

## 2016-12-24 HISTORY — DX: Personal history of other diseases of the musculoskeletal system and connective tissue: Z87.39

## 2016-12-24 HISTORY — DX: Personal history of adenomatous and serrated colon polyps: Z86.0101

## 2016-12-24 HISTORY — DX: Unspecified right bundle-branch block: I45.10

## 2016-12-24 HISTORY — PX: TRANSURETHRAL RESECTION OF PROSTATE: SHX73

## 2016-12-24 HISTORY — DX: Personal history of colonic polyps: Z86.010

## 2016-12-24 HISTORY — PX: HOLMIUM LASER APPLICATION: SHX5852

## 2016-12-24 HISTORY — DX: Vascular dementia, unspecified severity, without behavioral disturbance, psychotic disturbance, mood disturbance, and anxiety: F01.50

## 2016-12-24 HISTORY — PX: CYSTOSCOPY/RETROGRADE/URETEROSCOPY/STONE EXTRACTION WITH BASKET: SHX5317

## 2016-12-24 HISTORY — DX: Other intervertebral disc degeneration, lumbar region without mention of lumbar back pain or lower extremity pain: M51.369

## 2016-12-24 HISTORY — DX: Unspecified osteoarthritis, unspecified site: M19.90

## 2016-12-24 HISTORY — DX: Diaphragmatic hernia without obstruction or gangrene: K44.9

## 2016-12-24 HISTORY — DX: Other constipation: K59.09

## 2016-12-24 HISTORY — DX: Personal history of other diseases of male genital organs: Z87.438

## 2016-12-24 HISTORY — DX: Presence of other vascular implants and grafts: Z95.828

## 2016-12-24 HISTORY — DX: Long term (current) use of anticoagulants: Z79.01

## 2016-12-24 HISTORY — DX: Low back pain: M54.5

## 2016-12-24 HISTORY — DX: Other chronic pain: G89.29

## 2016-12-24 HISTORY — DX: Personal history of pulmonary embolism: Z86.711

## 2016-12-24 HISTORY — DX: Vascular parkinsonism: G21.4

## 2016-12-24 HISTORY — DX: Personal history of other malignant neoplasm of skin: Z85.828

## 2016-12-24 HISTORY — DX: Calculus of ureter: N20.1

## 2016-12-24 HISTORY — DX: Hypothyroidism, unspecified: E03.9

## 2016-12-24 HISTORY — DX: Other intervertebral disc degeneration, lumbar region: M51.36

## 2016-12-24 HISTORY — DX: Other specified postprocedural states: Z98.890

## 2016-12-24 HISTORY — DX: Sleep apnea, unspecified: G47.30

## 2016-12-24 HISTORY — DX: Presence of dental prosthetic device (complete) (partial): Z97.2

## 2016-12-24 HISTORY — DX: Presence of external hearing-aid: Z97.4

## 2016-12-24 HISTORY — DX: Low back pain, unspecified: M54.50

## 2016-12-24 LAB — POCT I-STAT, CHEM 8
BUN: 13 mg/dL (ref 6–20)
Calcium, Ion: 1.23 mmol/L (ref 1.15–1.40)
Chloride: 103 mmol/L (ref 101–111)
Creatinine, Ser: 0.9 mg/dL (ref 0.61–1.24)
GLUCOSE: 111 mg/dL — AB (ref 65–99)
HEMATOCRIT: 39 % (ref 39.0–52.0)
HEMOGLOBIN: 13.3 g/dL (ref 13.0–17.0)
POTASSIUM: 3.8 mmol/L (ref 3.5–5.1)
Sodium: 142 mmol/L (ref 135–145)
TCO2: 27 mmol/L (ref 0–100)

## 2016-12-24 SURGERY — CYSTOSCOPY, WITH CALCULUS REMOVAL USING BASKET
Anesthesia: General | Site: Renal | Laterality: Left

## 2016-12-24 MED ORDER — EPHEDRINE SULFATE-NACL 50-0.9 MG/10ML-% IV SOSY
PREFILLED_SYRINGE | INTRAVENOUS | Status: DC | PRN
  Administered 2016-12-24: 10 mg via INTRAVENOUS
  Administered 2016-12-24: 15 mg via INTRAVENOUS

## 2016-12-24 MED ORDER — OXYCODONE HCL 5 MG/5ML PO SOLN
5.0000 mg | Freq: Once | ORAL | Status: DC | PRN
  Filled 2016-12-24: qty 5

## 2016-12-24 MED ORDER — CEPHALEXIN 500 MG PO CAPS: 500.0000 mg | ORAL_CAPSULE | Freq: Two times a day (BID) | ORAL | 0 refills | Status: DC

## 2016-12-24 MED ORDER — SODIUM CHLORIDE 0.9 % IR SOLN
Status: DC | PRN
  Administered 2016-12-24: 5000 mL

## 2016-12-24 MED ORDER — ONDANSETRON HCL 4 MG/2ML IJ SOLN
INTRAMUSCULAR | Status: DC | PRN
  Administered 2016-12-24: 4 mg via INTRAVENOUS

## 2016-12-24 MED ORDER — IOHEXOL 300 MG/ML  SOLN
INTRAMUSCULAR | Status: DC | PRN
  Administered 2016-12-24: 10 mL

## 2016-12-24 MED ORDER — EPHEDRINE 5 MG/ML INJ
INTRAVENOUS | Status: AC
  Filled 2016-12-24: qty 10

## 2016-12-24 MED ORDER — CEFAZOLIN SODIUM-DEXTROSE 2-4 GM/100ML-% IV SOLN
2.0000 g | INTRAVENOUS | Status: AC
  Administered 2016-12-24: 2 g via INTRAVENOUS
  Filled 2016-12-24: qty 100

## 2016-12-24 MED ORDER — DEXAMETHASONE SODIUM PHOSPHATE 10 MG/ML IJ SOLN
INTRAMUSCULAR | Status: AC
  Filled 2016-12-24: qty 1

## 2016-12-24 MED ORDER — FENTANYL CITRATE (PF) 100 MCG/2ML IJ SOLN
INTRAMUSCULAR | Status: AC
  Filled 2016-12-24: qty 2

## 2016-12-24 MED ORDER — BELLADONNA ALKALOIDS-OPIUM 16.2-60 MG RE SUPP
RECTAL | Status: AC
  Filled 2016-12-24: qty 1

## 2016-12-24 MED ORDER — ONDANSETRON HCL 4 MG/2ML IJ SOLN
INTRAMUSCULAR | Status: AC
  Filled 2016-12-24: qty 2

## 2016-12-24 MED ORDER — LIDOCAINE 2% (20 MG/ML) 5 ML SYRINGE
INTRAMUSCULAR | Status: AC
  Filled 2016-12-24: qty 15

## 2016-12-24 MED ORDER — KETOROLAC TROMETHAMINE 30 MG/ML IJ SOLN
INTRAMUSCULAR | Status: DC | PRN
  Administered 2016-12-24: 15 mg via INTRAVENOUS

## 2016-12-24 MED ORDER — PROMETHAZINE HCL 25 MG/ML IJ SOLN
6.2500 mg | INTRAMUSCULAR | Status: DC | PRN
  Filled 2016-12-24: qty 1

## 2016-12-24 MED ORDER — LIDOCAINE 2% (20 MG/ML) 5 ML SYRINGE
INTRAMUSCULAR | Status: DC | PRN
  Administered 2016-12-24: 60 mg via INTRAVENOUS

## 2016-12-24 MED ORDER — PROPOFOL 10 MG/ML IV BOLUS
INTRAVENOUS | Status: DC | PRN
  Administered 2016-12-24: 50 mg via INTRAVENOUS
  Administered 2016-12-24: 10 mg via INTRAVENOUS
  Administered 2016-12-24: 100 mg via INTRAVENOUS
  Administered 2016-12-24: 40 mg via INTRAVENOUS

## 2016-12-24 MED ORDER — OXYCODONE HCL 5 MG PO TABS
5.0000 mg | ORAL_TABLET | Freq: Once | ORAL | Status: DC | PRN
  Filled 2016-12-24: qty 1

## 2016-12-24 MED ORDER — LACTATED RINGERS IV SOLN
INTRAVENOUS | Status: DC
  Administered 2016-12-24 (×2): via INTRAVENOUS
  Filled 2016-12-24: qty 1000

## 2016-12-24 MED ORDER — KETOROLAC TROMETHAMINE 30 MG/ML IJ SOLN
INTRAMUSCULAR | Status: AC
  Filled 2016-12-24: qty 1

## 2016-12-24 MED ORDER — CEFAZOLIN SODIUM-DEXTROSE 2-4 GM/100ML-% IV SOLN
INTRAVENOUS | Status: AC
  Filled 2016-12-24: qty 100

## 2016-12-24 MED ORDER — MIDAZOLAM HCL 2 MG/2ML IJ SOLN
INTRAMUSCULAR | Status: AC
  Filled 2016-12-24: qty 2

## 2016-12-24 MED ORDER — PROPOFOL 10 MG/ML IV BOLUS
INTRAVENOUS | Status: AC
  Filled 2016-12-24: qty 20

## 2016-12-24 MED ORDER — RIVAROXABAN 20 MG PO TABS: 20.0000 mg | ORAL_TABLET | Freq: Every day | ORAL | 3 refills | Status: DC

## 2016-12-24 MED ORDER — DEXAMETHASONE SODIUM PHOSPHATE 4 MG/ML IJ SOLN
INTRAMUSCULAR | Status: DC | PRN
  Administered 2016-12-24: 10 mg via INTRAVENOUS

## 2016-12-24 MED ORDER — FENTANYL CITRATE (PF) 100 MCG/2ML IJ SOLN
25.0000 ug | INTRAMUSCULAR | Status: DC | PRN
  Filled 2016-12-24: qty 1

## 2016-12-24 MED ORDER — CEFAZOLIN IN D5W 1 GM/50ML IV SOLN
1.0000 g | INTRAVENOUS | Status: DC
  Filled 2016-12-24: qty 50

## 2016-12-24 MED ORDER — FENTANYL CITRATE (PF) 100 MCG/2ML IJ SOLN
INTRAMUSCULAR | Status: DC | PRN
  Administered 2016-12-24 (×2): 25 ug via INTRAVENOUS
  Administered 2016-12-24: 50 ug via INTRAVENOUS

## 2016-12-24 SURGICAL SUPPLY — 46 items
BAG DRAIN URO-CYSTO SKYTR STRL (DRAIN) ×4 IMPLANT
BAG DRN UROCATH (DRAIN) ×2
BASKET LASER NITINOL 1.9FR (BASKET) IMPLANT
BASKET STNLS GEMINI 4WIRE 3FR (BASKET) IMPLANT
BASKET ZERO TIP NITINOL 2.4FR (BASKET) ×2 IMPLANT
BSKT STON RTRVL 120 1.9FR (BASKET)
BSKT STON RTRVL GEM 120X11 3FR (BASKET)
BSKT STON RTRVL ZERO TP 2.4FR (BASKET) ×2
CATH COUDE FOLEY 2W 5CC 18FR (CATHETERS) ×2 IMPLANT
CATH INTERMIT  6FR 70CM (CATHETERS) IMPLANT
CATH URET 5FR 28IN CONE TIP (BALLOONS)
CATH URET 5FR 28IN OPEN ENDED (CATHETERS) IMPLANT
CATH URET 5FR 70CM CONE TIP (BALLOONS) IMPLANT
CATH URET DUAL LUMEN 6-10FR 50 (CATHETERS) IMPLANT
CLOTH BEACON ORANGE TIMEOUT ST (SAFETY) ×4 IMPLANT
ELECT REM PT RETURN 9FT ADLT (ELECTROSURGICAL)
ELECTRODE REM PT RTRN 9FT ADLT (ELECTROSURGICAL) IMPLANT
FIBER LASER TRAC TIP (UROLOGICAL SUPPLIES) ×2 IMPLANT
GLOVE BIO SURGEON STRL SZ 6.5 (GLOVE) ×1 IMPLANT
GLOVE BIO SURGEON STRL SZ7.5 (GLOVE) ×4 IMPLANT
GLOVE BIO SURGEONS STRL SZ 6.5 (GLOVE) ×1
GLOVE BIOGEL PI IND STRL 6.5 (GLOVE) IMPLANT
GLOVE BIOGEL PI INDICATOR 6.5 (GLOVE) ×4
GOWN STRL REUS W/ TWL LRG LVL3 (GOWN DISPOSABLE) ×2 IMPLANT
GOWN STRL REUS W/ TWL XL LVL3 (GOWN DISPOSABLE) ×2 IMPLANT
GOWN STRL REUS W/TWL LRG LVL3 (GOWN DISPOSABLE) ×4 IMPLANT
GOWN STRL REUS W/TWL XL LVL3 (GOWN DISPOSABLE)
GUIDEWIRE 0.038 PTFE COATED (WIRE) IMPLANT
GUIDEWIRE ANG ZIPWIRE 038X150 (WIRE) ×4 IMPLANT
GUIDEWIRE STR DUAL SENSOR (WIRE) ×4 IMPLANT
IV NS 1000ML (IV SOLUTION) ×8
IV NS 1000ML BAXH (IV SOLUTION) IMPLANT
IV NS IRRIG 3000ML ARTHROMATIC (IV SOLUTION) ×6 IMPLANT
KIT BALLIN UROMAX 15FX10 (LABEL) IMPLANT
KIT BALLN UROMAX 15FX4 (MISCELLANEOUS) IMPLANT
KIT BALLN UROMAX 26 75X4 (MISCELLANEOUS)
KIT ROOM TURNOVER WOR (KITS) ×4 IMPLANT
LOOP CUT BIPOLAR 24F LRG (ELECTROSURGICAL) ×2 IMPLANT
MANIFOLD NEPTUNE II (INSTRUMENTS) IMPLANT
PACK CYSTO (CUSTOM PROCEDURE TRAY) ×4 IMPLANT
SET HIGH PRES BAL DIL (LABEL)
SHEATH ACCESS URETERAL 38CM (SHEATH) ×2 IMPLANT
STENT URET 6FRX26 CONTOUR (STENTS) ×2 IMPLANT
SYRINGE IRR TOOMEY STRL 70CC (SYRINGE) IMPLANT
TUBE CONNECTING 12'X1/4 (SUCTIONS)
TUBE CONNECTING 12X1/4 (SUCTIONS) IMPLANT

## 2016-12-24 NOTE — Discharge Instructions (Signed)
Foley Catheter Care, Adult  A Foley catheter is a soft, flexible tube that is placed into the bladder to drain urine. A Foley catheter may be inserted if:  · You leak urine or are not able to control when you urinate (urinary incontinence).  · You are not able to urinate when you need to (urinary retention).  · You had prostate surgery or surgery on the genitals.  · You have certain medical conditions, such as multiple sclerosis, dementia, or a spinal cord injury.  If you are going home with a Foley catheter in place, follow the instructions below.  TAKING CARE OF THE CATHETER  1. Wash your hands with soap and water.  2. Using mild soap and warm water on a clean washcloth:  ¨ Clean the area on your body closest to the catheter insertion site using a circular motion, moving away from the catheter. Never wipe toward the catheter because this could sweep bacteria up into the urethra and cause infection.  ¨ Remove all traces of soap. Pat the area dry with a clean towel. For males, reposition the foreskin.  3. Attach the catheter to your leg so there is no tension on the catheter. Use adhesive tape or a leg strap. If you are using adhesive tape, remove any sticky residue left behind by the previous tape you used.  4. Keep the drainage bag below the level of the bladder, but keep it off the floor.  5. Check throughout the day to be sure the catheter is working and urine is draining freely. Make sure the tubing does not become kinked.  6. Do not pull on the catheter or try to remove it. Pulling could damage internal tissues.  TAKING CARE OF THE DRAINAGE BAGS  You will be given two drainage bags to take home. One is a large overnight drainage bag, and the other is a smaller leg bag that fits underneath clothing. You may wear the overnight bag at any time, but you should never wear the smaller leg bag at night. Follow the instructions below for how to empty, change, and clean your drainage bags.  Emptying the Drainage Bag   You must empty your drainage bag when it is ?-½ full or at least 2-3 times a day.  1. Wash your hands with soap and water.  2. Keep the drainage bag below your hips, below the level of your bladder. This stops urine from going back into the tubing and into your bladder.  3. Hold the dirty bag over the toilet or a clean container.  4. Open the pour spout at the bottom of the bag and empty the urine into the toilet or container. Do not let the pour spout touch the toilet, container, or any other surface. Doing so can place bacteria on the bag, which can cause an infection.  5. Clean the pour spout with a gauze pad or cotton ball that has rubbing alcohol on it.  6. Close the pour spout.  7. Attach the bag to your leg with adhesive tape or a leg strap.  8. Wash your hands well.  Changing the Drainage Bag  Change your drainage bag once a month or sooner if it starts to smell bad or look dirty. Below are steps to follow when changing the drainage bag.  1. Wash your hands with soap and water.  2. Pinch off the rubber catheter so that urine does not spill out.  3. Disconnect the catheter tube from the drainage tube   to clean the end of the drainage tube. 5. Connect the catheter tube to the drainage tube of the clean drainage bag. 6. Attach the new bag to the leg with adhesive tape or a leg strap. Avoid attaching the new bag too tightly. 7. Wash your hands well. Cleaning the Drainage Bag  1. Wash your hands with soap and water. 2. Wash the bag in warm, soapy water. 3. Rinse the bag thoroughly with warm water. 4. Fill the bag with a solution of white vinegar and water (1 cup vinegar to 1 qt warm water [.2 L vinegar to 1 L warm water]). Close the bag and soak it for 30 minutes in the solution. 5. Rinse the bag with warm water. 6. Hang the bag to dry with the pour  spout open and hanging downward. 7. Store the clean bag (once it is dry) in a clean plastic bag. 8. Wash your hands well. PREVENTING INFECTION  Wash your hands before and after handling your catheter.  Take showers daily and wash the area where the catheter enters your body. Do not take baths. Replace wet leg straps with dry ones, if this applies.  Do not use powders, sprays, or lotions on the genital area. Only use creams, lotions, or ointments as directed by your caregiver.  For females, wipe from front to back after each bowel movement.  Drink enough fluids to keep your urine clear or pale yellow unless you have a fluid restriction.  Do not let the drainage bag or tubing touch or lie on the floor.  Wear cotton underwear to absorb moisture and to keep your skin drier. SEEK MEDICAL CARE IF:   Your urine is cloudy or smells unusually bad.  Your catheter becomes clogged.  You are not draining urine into the bag or your bladder feels full.  Your catheter starts to leak. SEEK IMMEDIATE MEDICAL CARE IF:   You have pain, swelling, redness, or pus where the catheter enters the body.  You have pain in the abdomen, legs, lower back, or bladder.  You have a fever.  You see blood fill the catheter, or your urine is pink or red.  You have nausea, vomiting, or chills.  Your catheter gets pulled out. MAKE SURE YOU:   Understand these instructions.  Will watch your condition.  Will get help right away if you are not doing well or get worse. This information is not intended to replace advice given to you by your health care provider. Make sure you discuss any questions you have with your health care provider. Document Released: 11/25/2005 Document Revised: 04/11/2014 Document Reviewed: 11/11/2015 Elsevier Interactive Patient Education  2017 [employer]   Ureteral Stent Implantation, Care After Introduction Refer to this sheet in the next few weeks. These instructions provide  you with information about caring for yourself after your procedure. Your health care provider may also give you more specific instructions. Your treatment has been planned according to current medical practices, but problems sometimes occur. Call your health care provider if you have any problems or questions after your procedure.  The stent will be removed with the foley catheter on Friday.   What can I expect after the procedure? After the procedure, it is common to have:  Nausea.  Mild pain when you urinate. You may feel this pain in your lower back or lower abdomen. Pain should stop within a few minutes after you urinate. This may last for up to 1 week.  A small amount of  blood in your urine for several days. Follow these instructions at home:   Medicines  Take over-the-counter and prescription medicines only as told by your health care provider.  If you were prescribed an antibiotic medicine, take it as told by your health care provider. Do not stop taking the antibiotic even if you start to feel better.  Do not drive for 24 hours if you received a sedative.  Do not drive or operate heavy machinery while taking prescription pain medicines. Activity  Return to your normal activities as told by your health care provider. Ask your health care provider what activities are safe for you.  Do not lift anything that is heavier than 10 lb (4.5 kg). Follow this limit for 1 week after your procedure, or for as long as told by your health care provider. General instructions  Watch for any blood in your urine. Call your health care provider if the amount of blood in your urine increases.  If you have a catheter:  Follow instructions from your health care provider about taking care of your catheter and collection bag.  Do not take baths, swim, or use a hot tub until your health care provider approves.  Drink enough fluid to keep your urine clear or pale yellow.  Keep all follow-up  visits as told by your health care provider. This is important. Contact a health care provider if:  You have pain that gets worse or does not get better with medicine, especially pain when you urinate.  You have difficulty urinating.  You feel nauseous or you vomit repeatedly during a period of more than 2 days after the procedure. Get help right away if:  Your urine is dark red or has blood clots in it.  You are leaking urine (have incontinence).  The end of the stent comes out of your urethra.  You cannot urinate.  You have sudden, sharp, or severe pain in your abdomen or lower back.  You have a fever. This information is not intended to replace advice given to you by your health care provider. Make sure you discuss any questions you have with your health care provider. Document Released: 07/28/2013 Document Revised: 05/02/2016 Document Reviewed: 06/09/2015  2017 Elsevier  Post Anesthesia Home Care Instructions  Activity: Get plenty of rest for the remainder of the day. A responsible adult should stay with you for 24 hours following the procedure.  For the next 24 hours, DO NOT: -Drive a car -Paediatric nurse -Drink alcoholic beverages -Take any medication unless instructed by your physician -Make any legal decisions or sign important papers.  Meals: Start with liquid foods such as gelatin or soup. Progress to regular foods as tolerated. Avoid greasy, spicy, heavy foods. If nausea and/or vomiting occur, drink only clear liquids until the nausea and/or vomiting subsides. Call your physician if vomiting continues.  Special Instructions/Symptoms: Your throat may feel dry or sore from the anesthesia or the breathing tube placed in your throat during surgery. If this causes discomfort, gargle with warm salt water. The discomfort should disappear within 24 hours.  If you had a scopolamine patch placed behind your ear for the management of post- operative nausea and/or  vomiting:  1. The medication in the patch is effective for 72 hours, after which it should be removed.  Wrap patch in a tissue and discard in the trash. Wash hands thoroughly with soap and water. 2. You may remove the patch earlier than 72 hours if you experience unpleasant side effects  which may include dry mouth, dizziness or visual disturbances. 3. Avoid touching the patch. Wash your hands with soap and water after contact with the patch.

## 2016-12-24 NOTE — Anesthesia Preprocedure Evaluation (Addendum)
Anesthesia Evaluation  Patient identified by MRN, date of birth, ID band Patient awake    Reviewed: Allergy & Precautions, NPO status , Patient's Chart, lab work & pertinent test results  Airway Mallampati: II  TM Distance: >3 FB Neck ROM: Full    Dental no notable dental hx. (+) Teeth Intact, Dental Advisory Given, Caps, Chipped,    Pulmonary former smoker, PE   Pulmonary exam normal breath sounds clear to auscultation       Cardiovascular hypertension, + Peripheral Vascular Disease  Normal cardiovascular exam Rhythm:Regular Rate:Normal     Neuro/Psych negative neurological ROS  negative psych ROS   GI/Hepatic negative GI ROS, Neg liver ROS, GERD  ,  Endo/Other  Hypothyroidism   Renal/GU negative Renal ROS  negative genitourinary   Musculoskeletal negative musculoskeletal ROS (+)   Abdominal   Peds negative pediatric ROS (+)  Hematology negative hematology ROS (+)   Anesthesia Other Findings   Reproductive/Obstetrics negative OB ROS                           Anesthesia Physical Anesthesia Plan  ASA: III  Anesthesia Plan: General   Post-op Pain Management:    Induction: Intravenous  Airway Management Planned: LMA  Additional Equipment:   Intra-op Plan:   Post-operative Plan: Extubation in OR  Informed Consent: I have reviewed the patients History and Physical, chart, labs and discussed the procedure including the risks, benefits and alternatives for the proposed anesthesia with the patient or authorized representative who has indicated his/her understanding and acceptance.   Dental advisory given  Plan Discussed with: CRNA and Surgeon  Anesthesia Plan Comments:         Anesthesia Quick Evaluation

## 2016-12-24 NOTE — Anesthesia Postprocedure Evaluation (Addendum)
Anesthesia Post Note  Patient: Daiki Molyneaux [place]  Procedure(s) Performed: Procedure(s) (LRB): CYSTOSCOPY/RETROGRADE/URETEROSCOPY/STONE EXTRACTION WITH BASKET HOLMIUM LASER STENT PLACEMENT (Left) HOLMIUM LASER APPLICATION (Left) TRANSURETHRAL RESECTION OF THE PROSTATE (TURP) (Left)  Patient location during evaluation: PACU Anesthesia Type: General Level of consciousness: awake and alert Pain management: pain level controlled Vital Signs Assessment: post-procedure vital signs reviewed and stable Respiratory status: spontaneous breathing, nonlabored ventilation, respiratory function stable and patient connected to nasal cannula oxygen Cardiovascular status: blood pressure returned to baseline and stable Postop Assessment: no signs of nausea or vomiting Anesthetic complications: no       Last Vitals:  Vitals:   12/24/16 0906 12/24/16 0915  BP: (!) 196/102 (!) 190/54  Pulse: (!) 51 64  Resp: 12 16  Temp: 36.8 C     Last Pain:  Vitals:   12/24/16 0623  TempSrc: Oral                 Zahir Eisenhour S

## 2016-12-24 NOTE — Op Note (Signed)
Preoperative diagnosis: Left distal ureteral stones, left renal stones, right renal stones Postoperative diagnosis: Left distal ureteral stones, left renal stones, right renal stones, prostatic urethral polyp  Procedure: #1 cystoscopy with left right retrograde pyelogram  #2 left ureteroscopy with holmium laser lithotripsy, stone basket extraction  #3 transurethral resection of prostate  #4 left ureteral stent placement   Surgeon: Junious Silk   Anesthesia: Gen.   Indications for procedure: 82 year old who underwent workup for gross hematuria. This revealed left distal ureteral stones. He also had 2 stones in the left kidney, right UPJ stone into stones in the right kidney. He was brought for the above procedures.   Findings: On cystoscopy there was a bulb urethral stricture the scope dilated. The prostate was elongated with a high bladder neck and a very difficult bladder access. There was a polyp in the left mid prostatic urethra.   Left retrograde pyelogram-this outlined a single ureter single collecting system unit without filling defect stricture dilation of the proximal ureter and collecting system. On scout imaging the stones were visible in the left distal ureter.  On scout imaging the right UPJ stone was stable and not approached.  On ureteroscopy the 2 stones were noted in the left distal ureter. Also 2 stones were noted and is slightly malrotated left kidney with narrow infundibula.   Description of procedure: After consent was obtained patient brought to the operating room. After adequate anesthesia he is placed in lithotomy position and prepped and draped in the usual sterile fashion. A timeout was performed to confirm the patient and procedure. The cystoscope was passed per urethra and a polyp was noted in the mid left prostatic urethra. It had a papillary appearance. The bladder neck was high and tight and difficult to get the scope in. Once the scope was placed the ureteral  orifices were in their normal orthotopic position. There were no stones or foreign bodies in the bladder. Left ureteral orifice was pinpoint. I could not cannulated and therefore passed a sensor wire through the ureter and a 6 [place] open-ended catheter over this. The wire was removed and left retrograde injection of contrast was performed. The wire was repassed and coiled in the collecting system. A semirigid single-channel needlepoint ureteroscope was advanced but it would not access the UO. Therefore the inner cannula of the access sheath was used to dilate the ureter further. The access sheath passed easily. Now the semirigid advanced into the left distal ureter were 2 stones were noted. They were broken up at 0.8 and 8 with a 2 under micron holmium laser fiber. The pieces were then grasped and dropped into the bladder. Under direct vision a Glidewire was advanced and cold in the collecting system. The access sheath was replaced into the proximal ureter without difficulty. The digital ureteroscope was used to inspect the kidney were 2 small stones were noted. These were grasped with a Nitinol basket and removed. No other stones were noted. The collecting system and renal pelvis and proximal ureter were inspected on the way out and noted to be normal. The access sheath and the ureteroscope were backed out together and the ureter was noted to be normal without injury or any other stone fragment.   I then passed the resectoscope and resected the polyp in the left prostatic urethra. It was collected and sent to pathology. The scope was then passed into the bladder and the stone fragments evacuated.   The Glidewire was then backloaded on the cystoscope and a 6 x  26 and a meter stent was advanced. The wire was removed with a good coil seen in the kidney and a good coil in the bladder. The scope was removed and an 15 [place] coud catheter placed and left gravity drainage. Drainage was clear. He was awakened taken  to recovery room in stable condition.   Complications: None   Blood loss: Minimal   Specimens:   #1 left transurethral resection of prostate to pathology  #2 stone fragments to office lab   Drains: 6 x 26 cm left ureteral stent, 18 French coud catheter  Disposition: Patient stable to PACU

## 2016-12-24 NOTE — Interval H&P Note (Signed)
History and Physical Interval Note:  12/24/2016 7:37 AM  Christian Wiley  has presented today for surgery, with the diagnosis of LEFT URETERAL STONE  The various methods of treatment have been discussed with the patient and family. After consideration of risks, benefits and other options for treatment, the patient has consented to  Procedure(s): CYSTOSCOPY/RETROGRADE/URETEROSCOPY/STONE EXTRACTION WITH BASKET HOLMIUM LASER STENT PLACEMENT (Left) HOLMIUM LASER APPLICATION (Left) as a surgical intervention .  The patient's history has been reviewed, patient examined, no change in status, stable for surgery. He hasn't passed a stone. He's had no pain, fever or dysuria. I discussed the right UPJ stone as well and nature r/b of URS today, surveillance or ESWL. His brother had ESWL and he prefers that for the right upj stone unless it has passed and is distal today. I have reviewed the patient's chart and labs.  Questions were answered to the patient's satisfaction.  He elects to proceed.    Arnisha Laffoon

## 2016-12-24 NOTE — Addendum Note (Signed)
Addendum  created 12/24/16 [id number] by Wanita Chamberlain, CRNA   Charge Capture section accepted

## 2016-12-24 NOTE — Transfer of Care (Signed)
Immediate Anesthesia Transfer of Care Note  Patient: Christian Wiley  Procedure(s) Performed: Procedure(s): CYSTOSCOPY/RETROGRADE/URETEROSCOPY/STONE EXTRACTION WITH BASKET HOLMIUM LASER STENT PLACEMENT (Left) HOLMIUM LASER APPLICATION (Left) TRANSURETHRAL RESECTION OF THE PROSTATE (TURP) (Left)  Patient Location: PACU  Anesthesia Type:General  Level of Consciousness: awake, alert , oriented and patient cooperative  Airway & Oxygen Therapy: Patient Spontanous Breathing and Patient connected to nasal cannula oxygen  Post-op Assessment: Report given to RN and Post -op Vital signs reviewed and stable  Post vital signs: Reviewed and stable  Last Vitals:  Vitals:   12/24/16 0623  BP: 135/67  Pulse: (!) 55  Resp: 16  Temp: 36.4 C    Last Pain:  Vitals:   12/24/16 0623  TempSrc: Oral      Patients Stated Pain Goal: 5 ([id number])  Complications: No apparent anesthesia complications

## 2016-12-24 NOTE — Anesthesia Procedure Notes (Signed)
Procedure Name: LMA Insertion Date/Time: 12/24/2016 7:40 AM Performed by: Wanita Chamberlain Pre-anesthesia Checklist: Patient identified, Timeout performed, Emergency Drugs available, Suction available and Patient being monitored Patient Re-evaluated:Patient Re-evaluated prior to inductionOxygen Delivery Method: Circle system utilized Preoxygenation: Pre-oxygenation with 100% oxygen Intubation Type: IV induction Ventilation: Mask ventilation without difficulty LMA: LMA inserted LMA Size: 4.0 Number of attempts: 1 Airway Equipment and Method: Bite block Placement Confirmation: positive ETCO2 and breath sounds checked- equal and bilateral Tube secured with: Tape Dental Injury: Teeth and Oropharynx as per pre-operative assessment

## 2016-12-25 ENCOUNTER — Encounter (HOSPITAL_BASED_OUTPATIENT_CLINIC_OR_DEPARTMENT_OTHER): Payer: Self-pay | Admitting: Urology

## 2016-12-31 DIAGNOSIS — R3912 Poor urinary stream: Secondary | ICD-10-CM | POA: Diagnosis not present

## 2017-01-01 ENCOUNTER — Emergency Department (HOSPITAL_COMMUNITY)
Admission: EM | Admit: 2017-01-01 | Discharge: 2017-01-01 | Disposition: A | Discharge status: Home / Self Care | Payer: PPO | Attending: Emergency Medicine | Admitting: Emergency Medicine

## 2017-01-01 ENCOUNTER — Encounter (HOSPITAL_COMMUNITY): Payer: Self-pay | Admitting: Emergency Medicine

## 2017-01-01 DIAGNOSIS — Z7901 Long term (current) use of anticoagulants: Secondary | ICD-10-CM | POA: Insufficient documentation

## 2017-01-01 DIAGNOSIS — Y639 Failure in dosage during unspecified surgical and medical care: Secondary | ICD-10-CM | POA: Diagnosis not present

## 2017-01-01 DIAGNOSIS — T83098A Other mechanical complication of other indwelling urethral catheter, initial encounter: Secondary | ICD-10-CM | POA: Diagnosis not present

## 2017-01-01 DIAGNOSIS — Z85828 Personal history of other malignant neoplasm of skin: Secondary | ICD-10-CM | POA: Insufficient documentation

## 2017-01-01 DIAGNOSIS — T839XXA Unspecified complication of genitourinary prosthetic device, implant and graft, initial encounter: Secondary | ICD-10-CM

## 2017-01-01 DIAGNOSIS — T83091A Other mechanical complication of indwelling urethral catheter, initial encounter: Secondary | ICD-10-CM | POA: Insufficient documentation

## 2017-01-01 DIAGNOSIS — I10 Essential (primary) hypertension: Secondary | ICD-10-CM | POA: Diagnosis not present

## 2017-01-01 DIAGNOSIS — E039 Hypothyroidism, unspecified: Secondary | ICD-10-CM | POA: Insufficient documentation

## 2017-01-01 DIAGNOSIS — Z87891 Personal history of nicotine dependence: Secondary | ICD-10-CM | POA: Diagnosis not present

## 2017-01-01 DIAGNOSIS — Z96651 Presence of right artificial knee joint: Secondary | ICD-10-CM | POA: Insufficient documentation

## 2017-01-01 DIAGNOSIS — R310 Gross hematuria: Secondary | ICD-10-CM | POA: Diagnosis not present

## 2017-01-01 DIAGNOSIS — R339 Retention of urine, unspecified: Secondary | ICD-10-CM | POA: Diagnosis not present

## 2017-01-01 LAB — I-STAT CHEM 8, ED
BUN: 19 mg/dL (ref 6–20)
CHLORIDE: 96 mmol/L — AB (ref 101–111)
CREATININE: 1.3 mg/dL — AB (ref 0.61–1.24)
Calcium, Ion: 1.13 mmol/L — ABNORMAL LOW (ref 1.15–1.40)
Glucose, Bld: 142 mg/dL — ABNORMAL HIGH (ref 65–99)
HEMATOCRIT: 33 % — AB (ref 39.0–52.0)
Hemoglobin: 11.2 g/dL — ABNORMAL LOW (ref 13.0–17.0)
POTASSIUM: 3.9 mmol/L (ref 3.5–5.1)
Sodium: 134 mmol/L — ABNORMAL LOW (ref 135–145)
TCO2: 25 mmol/L (ref 0–100)

## 2017-01-01 NOTE — ED Notes (Signed)
Patient denies pain and is resting comfortably.  

## 2017-01-01 NOTE — ED Triage Notes (Signed)
Patient complaining about not being able to urinate. Patient got up from bed and forgot to take the bag with him which made him pull his catheter. Patient has dark red blood in urine bag.

## 2017-01-01 NOTE — Discharge Instructions (Signed)
You were seen today for problems with your Foley catheter. You have developed gross hematuria. This is likely related to being on Xarelto.  Given that he likely had some trauma, this may persist. Your kidney function is reassuring. You do have mild anemia. Monitor urine output. If you continue large quantities of gross urine, you need to be reevaluated immediately. Call urology for evaluation later today.

## 2017-01-01 NOTE — ED Provider Notes (Addendum)
[facility] DEPT Provider Note   CSN: [id number] Arrival date & time: 01/01/17  [id number]     History   Chief Complaint Chief Complaint  Patient presents with  . Urinary Retention    HPI Christian Wiley is a 82 y.o. male.  HPI  This is an 82 year old male with a history of PE on Xarelto, AAA, recent kidney stones status post lithotripsy last Tuesday who presents with a Foley catheter problem. She reports that he had lithotripsy on Tuesday of last week. He had Foley and stent removal on Friday. He reports that he had no problems over the weekend. However, on Monday he developed gross hematuria. He was seen by urology, Dr. Junious Silk, on Tuesday. Foley catheter was replaced. Patient is wife reports that he got up to go the bathroom and his Foley pulled off from the bag. It subsequently had decreased drainage. Patient reports persistent left flank pain. He has had that since his lithotripsy. Denies any fevers. He restarted Xarelto on Thursday.  Past Medical History:  Diagnosis Date  . Anticoagulant long-term use    xarelto-- due to hx PE's   . Chronic constipation   . Chronic low back pain   . DDD (degenerative disc disease), lumbar   . Frequency of urination   . GERD (gastroesophageal reflux disease)   . H/O endovascular stent graft for abdominal aortic aneurysm followed by dr Scot Dock   04/ 2001  s/p infarenal AAA w/ AneuRx graft stent:  last duplex 08-14-2016 repair patent, AAA sac size 4.5cm x 4.72cm (stable), no endoleak detected  . Hiatal hernia   . History of adenomatous polyp of colon    2010-- tubular adenoma and hyperplastic  . History of basal cell carcinoma excision    yrs ago  . History of BPH   . History of gout    per pt's wife gout stable as of 12-19-2016  . History of pulmonary embolus (PE)    04/ 2011 and 10/ 2014  . History of skin cancer   . Hyperlipidemia   . Hypertension   . Hypothyroidism   . Left ureteral stone   . Mild sleep apnea    per study  06-04-2016 very mild sleep apnea not obstructive , cpap not recommended  . Nocturia   . OA (osteoarthritis)   . RBBB (right bundle branch block)   . Urgency incontinence   . Vascular dementia   . 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 Wears glasses   . Wears hearing aid    bilateral   . Wears partial dentures    upper    Patient Active Problem List   Diagnosis Date Noted  . PBA (pseudobulbar affect) 06/24/2016  . Vascular dementia 06/24/2016  . Greater trochanteric bursitis of right hip 07/15/2014  . Bursitis of hip 07/15/2014  . Pulmonary embolism ([place]) 10/01/2013    Class: Acute  . Thrombocytopenia ([place]) 10/01/2013    Class: Acute  . Hypertension 10/01/2013    Class: Chronic  . Abdominal aneurysm without mention of rupture 01/15/2012    Past Surgical History:  Procedure Laterality Date  . ABDOMINAL AORTIC ENDOVASCULAR STENT GRAFT  04/ 2001   dr Scot Dock   AneuRx graft stent  . CATARACT EXTRACTION W/ INTRAOCULAR LENS  IMPLANT, BILATERAL  2001 approx.  . CHOLECYSTECTOMY OPEN  1980's  . COLONOSCOPY  last one 01/ 2010  . CYSTOSCOPY/RETROGRADE/URETEROSCOPY/STONE EXTRACTION WITH BASKET Left 12/24/2016   Procedure: CYSTOSCOPY/RETROGRADE/URETEROSCOPY/STONE EXTRACTION WITH BASKET HOLMIUM LASER STENT  PLACEMENT;  Surgeon: Festus Aloe, MD;  Location: [facility];  Service: Urology;  Laterality: Left;  . EXCISION/RELEASE BURSA HIP Right 07/15/2014   Procedure: RIGHT HIP BURSECTOMY WITH TENDON REPAIR ;  Surgeon: Gearlean Alf, MD;  Location: [facility];  Service: Orthopedics;  Laterality: Right;  . HOLMIUM LASER APPLICATION Left [id number]   Procedure: HOLMIUM LASER APPLICATION;  Surgeon: Festus Aloe, MD;  Location: [facility];  Service: Urology;  Laterality: Left;  . KNEE ARTHROSCOPY Bilateral 1990's-- right x2 ;  left x1  . POSTERIOR LAMINECTOMY / DECOMPRESSION LUMBAR SPINE  03/24/2002   L2 -- S1  . POSTERIOR LUMBAR FUSION   11/16/2010   L2 -- L4  . SHOULDER OPEN ROTATOR CUFF REPAIR Bilateral lef 11/15/2009/  right 2013 approx  . TONSILLECTOMY  child  . TOTAL KNEE ARTHROPLASTY  right 08-08-2000/  left 01-28-2003  . TRANSURETHRAL RESECTION OF PROSTATE Left 12/24/2016   Procedure: TRANSURETHRAL RESECTION OF THE PROSTATE (TURP);  Surgeon: Festus Aloe, MD;  Location: [facility];  Service: Urology;  Laterality: Left;       Home Medications    Prior to Admission medications   Medication Sig Start Date End Date Taking? Authorizing Provider  allopurinol (ZYLOPRIM) 300 MG tablet Take 300 mg by mouth every morning.     Historical Provider, MD  atorvastatin (LIPITOR) 40 MG tablet Take 1 tablet by mouth at bedtime.  06/25/13   Historical Provider, MD  carbidopa-levodopa (SINEMET IR) 25-100 MG tablet TAKE 1 TABLET BY MOUTH 3 (THREE) TIMES DAILY. Patient taking differently: Take 1 tablet by mouth 3 (three) times daily. TAKE 1 TABLET BY MOUTH 3 (THREE) TIMES DAILY. 11/27/16   Eustace Quail Tat, DO  cephALEXin (KEFLEX) 500 MG capsule Take 1 capsule (500 mg total) by mouth 2 (two) times daily. 12/24/16   Festus Aloe, MD  escitalopram (LEXAPRO) 10 MG tablet Take 10 mg by mouth every morning.  05/20/16   Historical Provider, MD  HYDROcodone-acetaminophen (NORCO/VICODIN) 5-325 MG tablet TAKE 2 TABLETs EVERY 4 HOURS AS NEEDED FOR PAIN 10/20/15   Historical Provider, MD  levothyroxine (SYNTHROID, LEVOTHROID) 25 MCG tablet Take 25 mcg by mouth daily before breakfast.  12/05/15   Historical Provider, MD  nebivolol (BYSTOLIC) 10 MG tablet Take 10 mg by mouth every morning.    Historical Provider, MD  rivaroxaban (XARELTO) 20 MG TABS tablet Take 1 tablet (20 mg total) by mouth daily with supper. 12/25/16   Festus Aloe, MD  Sennosides (EX-LAX) 15 MG TABS Take by mouth as needed.    Historical Provider, MD    Family History Family History  Problem Relation Age of Onset  . Hyperlipidemia Mother   . Hypertension  Mother   . Hyperlipidemia Father   . Hypertension Father   . Heart disease Father   . Heart attack Father   . Cancer Sister   . Hyperlipidemia Brother   . Hypertension Daughter     Social History Social History  Substance Use Topics  . Smoking status: Former Smoker    Years: 20.00    Types: Cigarettes    Quit date: 10/01/1974  . Smokeless tobacco: Never Used  . Alcohol use No     Allergies   Ambien [zolpidem tartrate]   Review of Systems Review of Systems  Constitutional: Negative for fever.  Genitourinary: Positive for flank pain, hematuria and penile pain.  All other systems reviewed and are negative.    Physical Exam Updated Vital Signs BP 110/58 (BP  Location: Left Arm)   Pulse 62   Temp 98.1 F (36.7 C) (Oral)   Resp 18   Ht 6' (1.829 m)   Wt 226 lb (102.5 kg)   SpO2 97%   BMI 30.65 kg/m   Physical Exam  Constitutional: He is oriented to person, place, and time. No distress.  Elderly, no acute distress  HENT:  Head: Normocephalic and atraumatic.  Cardiovascular: Normal rate and regular rhythm.   Pulmonary/Chest: Effort normal. No respiratory distress.  Abdominal: Soft. Bowel sounds are normal. There is no tenderness. There is no rebound and no guarding.  Genitourinary:  Genitourinary Comments: Foley catheter in place, gross blood noted in catheter bag, no CVA tenderness  Musculoskeletal: He exhibits no edema.  Neurological: He is alert and oriented to person, place, and time.  Skin: Skin is warm and dry.  Psychiatric: He has a normal mood and affect.  Nursing note and vitals reviewed.    ED Treatments / Results  Labs (all labs ordered are listed, but only abnormal results are displayed) Labs Reviewed  I-STAT CHEM 8, ED - Abnormal; Notable for the following:       Result Value   Sodium 134 (*)    Chloride 96 (*)    Creatinine, Ser 1.30 (*)    Glucose, Bld 142 (*)    Calcium, Ion 1.13 (*)    Hemoglobin 11.2 (*)    HCT 33.0 (*)    All  other components within normal limits    EKG  EKG Interpretation None       Radiology No results found.  Procedures Procedures (including critical care time)  Medications Ordered in ED Medications - No data to display   Initial Impression / Assessment and Plan / ED Course  I have reviewed the triage vital signs and the nursing notes.  Pertinent labs & imaging results that were available during my care of the patient were reviewed by me and considered in my medical decision making (see chart for details).     Patient presents with gross hematuria. Reports recent trauma. Foley was not draining well for nursing. This was replaced. He is now draining gross blood. He is on his Xarelto. Chem-8 obtained. Creatinine 1.3. Baseline 1. Hemoglobin down from 13 to 11.  Patient is otherwise nontoxic and afebrile. I have discussed with he and his wife the need to closely monitor his Foley output. If he has large amounts of gross blood, he needs to be reevaluated immediately. He may need to hold his Xarelto. I have encouraged follow-up with primary physician to discuss this as there are risk and benefits to holding his Xarelto. He also needs to be reevaluated by urology later today.  They stated understanding.  After history, exam, and medical workup I feel the patient has been appropriately medically screened and is safe for discharge home. Pertinent diagnoses were discussed with the patient. Patient was given return precautions.   Final Clinical Impressions(s) / ED Diagnoses   Final diagnoses:  Problem with Foley catheter, initial encounter [facility])  Gross hematuria    New Prescriptions New Prescriptions   No medications on file     Merryl Hacker, MD 01/01/17 [id number]    Merryl Hacker, MD 01/01/17 [phone]

## 2017-01-01 NOTE — ED Notes (Signed)
Bed: WTR5 Expected date:  Expected time:  Means of arrival:  Comments: 

## 2017-01-02 ENCOUNTER — Telehealth: Payer: Self-pay | Admitting: Neurology

## 2017-01-02 NOTE — Telephone Encounter (Signed)
Spoke with patient's wife and she states that patient doing really well off of Wellbutrin.   His hallucinations don't seem to be bad at the moment. He is having increased confusion after kidney stone surgery but this is getting better as well.   Follow up appt made and they will call if they need [place] sooner.

## 2017-01-02 NOTE — Telephone Encounter (Signed)
Please call wife back about meds.

## 2017-01-06 ENCOUNTER — Other Ambulatory Visit: Payer: Self-pay

## 2017-01-06 ENCOUNTER — Inpatient Hospital Stay (HOSPITAL_COMMUNITY)
Admission: EM | Admit: 2017-01-06 | Discharge: 2017-01-10 | DRG: 698 | Disposition: A | Discharge status: Home Health | Payer: PPO | Attending: Family Medicine | Admitting: Family Medicine

## 2017-01-06 ENCOUNTER — Inpatient Hospital Stay (HOSPITAL_COMMUNITY): Payer: PPO

## 2017-01-06 ENCOUNTER — Emergency Department (HOSPITAL_COMMUNITY): Payer: PPO

## 2017-01-06 ENCOUNTER — Encounter (HOSPITAL_COMMUNITY): Payer: Self-pay | Admitting: Emergency Medicine

## 2017-01-06 DIAGNOSIS — I110 Hypertensive heart disease with heart failure: Secondary | ICD-10-CM | POA: Diagnosis present

## 2017-01-06 DIAGNOSIS — Z95828 Presence of other vascular implants and grafts: Secondary | ICD-10-CM | POA: Diagnosis not present

## 2017-01-06 DIAGNOSIS — Z96651 Presence of right artificial knee joint: Secondary | ICD-10-CM | POA: Diagnosis present

## 2017-01-06 DIAGNOSIS — G20 Parkinson's disease: Secondary | ICD-10-CM | POA: Diagnosis present

## 2017-01-06 DIAGNOSIS — R7989 Other specified abnormal findings of blood chemistry: Secondary | ICD-10-CM | POA: Diagnosis present

## 2017-01-06 DIAGNOSIS — I10 Essential (primary) hypertension: Secondary | ICD-10-CM

## 2017-01-06 DIAGNOSIS — F0150 Vascular dementia without behavioral disturbance: Secondary | ICD-10-CM | POA: Diagnosis present

## 2017-01-06 DIAGNOSIS — R4182 Altered mental status, unspecified: Secondary | ICD-10-CM | POA: Diagnosis not present

## 2017-01-06 DIAGNOSIS — R338 Other retention of urine: Secondary | ICD-10-CM | POA: Diagnosis present

## 2017-01-06 DIAGNOSIS — I4510 Unspecified right bundle-branch block: Secondary | ICD-10-CM | POA: Diagnosis present

## 2017-01-06 DIAGNOSIS — Z960 Presence of urogenital implants: Secondary | ICD-10-CM | POA: Diagnosis not present

## 2017-01-06 DIAGNOSIS — I959 Hypotension, unspecified: Secondary | ICD-10-CM | POA: Diagnosis present

## 2017-01-06 DIAGNOSIS — K5909 Other constipation: Secondary | ICD-10-CM | POA: Diagnosis present

## 2017-01-06 DIAGNOSIS — E860 Dehydration: Secondary | ICD-10-CM | POA: Diagnosis present

## 2017-01-06 DIAGNOSIS — E876 Hypokalemia: Secondary | ICD-10-CM | POA: Diagnosis not present

## 2017-01-06 DIAGNOSIS — N179 Acute kidney failure, unspecified: Secondary | ICD-10-CM | POA: Diagnosis present

## 2017-01-06 DIAGNOSIS — Z7901 Long term (current) use of anticoagulants: Secondary | ICD-10-CM

## 2017-01-06 DIAGNOSIS — E785 Hyperlipidemia, unspecified: Secondary | ICD-10-CM | POA: Diagnosis not present

## 2017-01-06 DIAGNOSIS — N200 Calculus of kidney: Secondary | ICD-10-CM | POA: Diagnosis present

## 2017-01-06 DIAGNOSIS — Z87891 Personal history of nicotine dependence: Secondary | ICD-10-CM

## 2017-01-06 DIAGNOSIS — B964 Proteus (mirabilis) (morganii) as the cause of diseases classified elsewhere: Secondary | ICD-10-CM | POA: Diagnosis present

## 2017-01-06 DIAGNOSIS — A419 Sepsis, unspecified organism: Secondary | ICD-10-CM | POA: Diagnosis present

## 2017-01-06 DIAGNOSIS — B9620 Unspecified Escherichia coli [E. coli] as the cause of diseases classified elsewhere: Secondary | ICD-10-CM | POA: Diagnosis not present

## 2017-01-06 DIAGNOSIS — Y846 Urinary catheterization as the cause of abnormal reaction of the patient, or of later complication, without mention of misadventure at the time of the procedure: Secondary | ICD-10-CM | POA: Diagnosis present

## 2017-01-06 DIAGNOSIS — I5032 Chronic diastolic (congestive) heart failure: Secondary | ICD-10-CM | POA: Diagnosis present

## 2017-01-06 DIAGNOSIS — N401 Enlarged prostate with lower urinary tract symptoms: Secondary | ICD-10-CM | POA: Diagnosis not present

## 2017-01-06 DIAGNOSIS — T83511A Infection and inflammatory reaction due to indwelling urethral catheter, initial encounter: Secondary | ICD-10-CM | POA: Diagnosis not present

## 2017-01-06 DIAGNOSIS — M109 Gout, unspecified: Secondary | ICD-10-CM | POA: Diagnosis present

## 2017-01-06 DIAGNOSIS — E039 Hypothyroidism, unspecified: Secondary | ICD-10-CM | POA: Diagnosis not present

## 2017-01-06 DIAGNOSIS — R509 Fever, unspecified: Secondary | ICD-10-CM | POA: Diagnosis not present

## 2017-01-06 DIAGNOSIS — N400 Enlarged prostate without lower urinary tract symptoms: Secondary | ICD-10-CM

## 2017-01-06 DIAGNOSIS — Z888 Allergy status to other drugs, medicaments and biological substances status: Secondary | ICD-10-CM

## 2017-01-06 DIAGNOSIS — G8929 Other chronic pain: Secondary | ICD-10-CM

## 2017-01-06 DIAGNOSIS — R789 Finding of unspecified substance, not normally found in blood: Secondary | ICD-10-CM | POA: Diagnosis not present

## 2017-01-06 DIAGNOSIS — N132 Hydronephrosis with renal and ureteral calculous obstruction: Secondary | ICD-10-CM | POA: Diagnosis not present

## 2017-01-06 DIAGNOSIS — A498 Other bacterial infections of unspecified site: Secondary | ICD-10-CM | POA: Diagnosis not present

## 2017-01-06 DIAGNOSIS — R778 Other specified abnormalities of plasma proteins: Secondary | ICD-10-CM | POA: Diagnosis present

## 2017-01-06 DIAGNOSIS — N390 Urinary tract infection, site not specified: Secondary | ICD-10-CM | POA: Diagnosis present

## 2017-01-06 DIAGNOSIS — R748 Abnormal levels of other serum enzymes: Secondary | ICD-10-CM | POA: Diagnosis not present

## 2017-01-06 DIAGNOSIS — M549 Dorsalgia, unspecified: Secondary | ICD-10-CM

## 2017-01-06 DIAGNOSIS — Z8249 Family history of ischemic heart disease and other diseases of the circulatory system: Secondary | ICD-10-CM

## 2017-01-06 DIAGNOSIS — Z86711 Personal history of pulmonary embolism: Secondary | ICD-10-CM

## 2017-01-06 LAB — COMPREHENSIVE METABOLIC PANEL
ALK PHOS: 82 U/L (ref 38–126)
ALT: 12 U/L — AB (ref 17–63)
AST: 40 U/L (ref 15–41)
Albumin: 3.1 g/dL — ABNORMAL LOW (ref 3.5–5.0)
Anion gap: 12 (ref 5–15)
BUN: 20 mg/dL (ref 6–20)
CALCIUM: 8.5 mg/dL — AB (ref 8.9–10.3)
CHLORIDE: 101 mmol/L (ref 101–111)
CO2: 25 mmol/L (ref 22–32)
CREATININE: 1.33 mg/dL — AB (ref 0.61–1.24)
GFR calc Af Amer: 57 mL/min — ABNORMAL LOW (ref >60)
GFR, EST NON AFRICAN AMERICAN: 49 mL/min — AB (ref >60)
Glucose, Bld: 231 mg/dL — ABNORMAL HIGH (ref 65–99)
Potassium: 4 mmol/L (ref 3.5–5.1)
Sodium: 138 mmol/L (ref 135–145)
Total Bilirubin: 1.4 mg/dL — ABNORMAL HIGH (ref 0.3–1.2)
Total Protein: 6 g/dL — ABNORMAL LOW (ref 6.5–8.1)

## 2017-01-06 LAB — CBC WITH DIFFERENTIAL/PLATELET
Basophils Absolute: 0 10*3/uL (ref 0.0–0.1)
Basophils Relative: 0 %
EOS PCT: 0 %
Eosinophils Absolute: 0 10*3/uL (ref 0.0–0.7)
HEMATOCRIT: 31.2 % — AB (ref 39.0–52.0)
Hemoglobin: 10.3 g/dL — ABNORMAL LOW (ref 13.0–17.0)
LYMPHS PCT: 14 %
Lymphs Abs: 1.3 10*3/uL (ref 0.7–4.0)
MCH: 29.3 pg (ref 26.0–34.0)
MCHC: 33 g/dL (ref 30.0–36.0)
MCV: 88.6 fL (ref 78.0–100.0)
MONO ABS: 0.3 10*3/uL (ref 0.1–1.0)
MONOS PCT: 3 %
NEUTROS ABS: 7.9 10*3/uL — AB (ref 1.7–7.7)
Neutrophils Relative %: 83 %
Platelets: 150 10*3/uL (ref 150–400)
RBC: 3.52 MIL/uL — ABNORMAL LOW (ref 4.22–5.81)
RDW: 14.3 % (ref 11.5–15.5)
WBC: 9.5 10*3/uL (ref 4.0–10.5)

## 2017-01-06 LAB — I-STAT CG4 LACTIC ACID, ED: LACTIC ACID, VENOUS: 3.07 mmol/L — AB (ref 0.5–1.9)

## 2017-01-06 LAB — URINALYSIS, MICROSCOPIC (REFLEX)

## 2017-01-06 LAB — PROTIME-INR
INR: 2.1
Prothrombin Time: 23.9 seconds — ABNORMAL HIGH (ref 11.4–15.2)

## 2017-01-06 LAB — URINALYSIS, ROUTINE W REFLEX MICROSCOPIC
Bilirubin Urine: NEGATIVE
GLUCOSE, UA: NEGATIVE mg/dL
Ketones, ur: NEGATIVE mg/dL
Nitrite: NEGATIVE
PH: 8.5 — AB (ref 5.0–8.0)
Protein, ur: 100 mg/dL — AB
SPECIFIC GRAVITY, URINE: 1.01 (ref 1.005–1.030)

## 2017-01-06 LAB — INFLUENZA PANEL BY PCR (TYPE A & B)
INFLAPCR: NEGATIVE
Influenza B By PCR: NEGATIVE

## 2017-01-06 LAB — LACTIC ACID, PLASMA: Lactic Acid, Venous: 1.4 mmol/L (ref 0.5–1.9)

## 2017-01-06 MED ORDER — DEXTROSE 5 % IV SOLN
2.0000 g | Freq: Once | INTRAVENOUS | Status: AC
  Administered 2017-01-06: 2 g via INTRAVENOUS
  Filled 2017-01-06: qty 2

## 2017-01-06 MED ORDER — ACETAMINOPHEN 500 MG PO TABS
1000.0000 mg | ORAL_TABLET | Freq: Once | ORAL | Status: AC
  Administered 2017-01-06: 1000 mg via ORAL
  Filled 2017-01-06: qty 2

## 2017-01-06 MED ORDER — LEVOTHYROXINE SODIUM 25 MCG PO TABS
25.0000 ug | ORAL_TABLET | Freq: Every day | ORAL | Status: DC
  Administered 2017-01-07 – 2017-01-10 (×4): 25 ug via ORAL
  Filled 2017-01-06 (×4): qty 1

## 2017-01-06 MED ORDER — ESCITALOPRAM OXALATE 10 MG PO TABS
10.0000 mg | ORAL_TABLET | Freq: Every morning | ORAL | Status: DC
  Administered 2017-01-07 – 2017-01-10 (×4): 10 mg via ORAL
  Filled 2017-01-06 (×5): qty 1

## 2017-01-06 MED ORDER — SODIUM CHLORIDE 0.9 % IV SOLN
INTRAVENOUS | Status: DC
Start: 2017-01-06 — End: 2017-01-07
  Administered 2017-01-06 – 2017-01-07 (×2): via INTRAVENOUS

## 2017-01-06 MED ORDER — DEXTROSE 5 % IV SOLN
1.0000 g | Freq: Three times a day (TID) | INTRAVENOUS | Status: DC
  Filled 2017-01-06: qty 1

## 2017-01-06 MED ORDER — SENNA 8.6 MG PO TABS: 17.2000 mg | ORAL_TABLET | ORAL | Status: DC | PRN

## 2017-01-06 MED ORDER — POLYETHYLENE GLYCOL 3350 17 G PO PACK: 17.0000 g | PACK | Freq: Every day | ORAL | Status: DC | PRN

## 2017-01-06 MED ORDER — SODIUM CHLORIDE 0.9 % IV BOLUS (SEPSIS)
1000.0000 mL | Freq: Once | INTRAVENOUS | Status: AC
  Administered 2017-01-06: 1000 mL via INTRAVENOUS

## 2017-01-06 MED ORDER — IOPAMIDOL (ISOVUE-300) INJECTION 61%
100.0000 mL | Freq: Once | INTRAVENOUS | Status: AC | PRN
  Administered 2017-01-06: 100 mL via INTRAVENOUS

## 2017-01-06 MED ORDER — VANCOMYCIN HCL IN DEXTROSE 1-5 GM/200ML-% IV SOLN
1000.0000 mg | Freq: Once | INTRAVENOUS | Status: AC
  Administered 2017-01-06: 1000 mg via INTRAVENOUS
  Filled 2017-01-06: qty 200

## 2017-01-06 MED ORDER — ONDANSETRON HCL 4 MG/2ML IJ SOLN: 4.0000 mg | Freq: Four times a day (QID) | INTRAMUSCULAR | Status: DC | PRN

## 2017-01-06 MED ORDER — SODIUM CHLORIDE 0.9 % IV BOLUS (SEPSIS)
500.0000 mL | Freq: Once | INTRAVENOUS | Status: AC
  Administered 2017-01-06: 500 mL via INTRAVENOUS

## 2017-01-06 MED ORDER — ONDANSETRON HCL 4 MG PO TABS: 4.0000 mg | ORAL_TABLET | Freq: Four times a day (QID) | ORAL | Status: DC | PRN

## 2017-01-06 MED ORDER — MEROPENEM 1 G IV SOLR
1.0000 g | Freq: Three times a day (TID) | INTRAVENOUS | Status: DC
  Administered 2017-01-06 – 2017-01-09 (×9): 1 g via INTRAVENOUS
  Filled 2017-01-06 (×10): qty 1

## 2017-01-06 MED ORDER — VANCOMYCIN HCL 10 G IV SOLR
1250.0000 mg | INTRAVENOUS | Status: DC
  Administered 2017-01-06: 1250 mg via INTRAVENOUS
  Filled 2017-01-06: qty 1250

## 2017-01-06 MED ORDER — IOPAMIDOL (ISOVUE-300) INJECTION 61%
INTRAVENOUS | Status: AC
  Filled 2017-01-06: qty 100

## 2017-01-06 MED ORDER — ACETAMINOPHEN 325 MG PO TABS: 650.0000 mg | ORAL_TABLET | Freq: Four times a day (QID) | ORAL | Status: DC | PRN

## 2017-01-06 MED ORDER — ACETAMINOPHEN 650 MG RE SUPP: 650.0000 mg | Freq: Four times a day (QID) | RECTAL | Status: DC | PRN

## 2017-01-06 MED ORDER — NAPROXEN SODIUM 275 MG PO TABS: 275.0000 mg | ORAL_TABLET | Freq: Every day | ORAL | Status: DC

## 2017-01-06 MED ORDER — CARBIDOPA-LEVODOPA 25-100 MG PO TABS
1.0000 | ORAL_TABLET | Freq: Three times a day (TID) | ORAL | Status: DC
  Administered 2017-01-06 – 2017-01-10 (×13): 1 via ORAL
  Filled 2017-01-06 (×13): qty 1

## 2017-01-06 MED ORDER — RIVAROXABAN 20 MG PO TABS
20.0000 mg | ORAL_TABLET | Freq: Every day | ORAL | Status: DC
  Administered 2017-01-06 – 2017-01-09 (×4): 20 mg via ORAL
  Filled 2017-01-06 (×4): qty 1

## 2017-01-06 MED ORDER — HYDROCODONE-ACETAMINOPHEN 5-325 MG PO TABS
1.0000 | ORAL_TABLET | Freq: Four times a day (QID) | ORAL | Status: DC | PRN
  Administered 2017-01-06: 1 via ORAL
  Administered 2017-01-08 – 2017-01-09 (×2): 2 via ORAL
  Filled 2017-01-06: qty 2
  Filled 2017-01-06: qty 1
  Filled 2017-01-06: qty 2

## 2017-01-06 MED ORDER — TAMSULOSIN HCL 0.4 MG PO CAPS
0.4000 mg | ORAL_CAPSULE | Freq: Every day | ORAL | Status: DC
  Administered 2017-01-06 – 2017-01-09 (×4): 0.4 mg via ORAL
  Filled 2017-01-06 (×4): qty 1

## 2017-01-06 NOTE — Consult Note (Signed)
Urology Consult  Consulting MD: Elon Jester  CC: Sepsis  HPI: This is a 82 year old male status post recent left ureteroscopic stone extraction with laser by Dr. Junious Silk on 12/24/2016.  Patient had this performed as an outpatient.  There were no complications or difficulties with the procedure.  He had his stent removed.  Following the procedure.  There was difficulty with unintended removal of the catheter by mistake by the patient.  He has a catheter in place at this time.  The patient was admitted for possible sepsis.  He has a fever of over 101.  He has had confusion.  Emergency room evaluation included a CT scan which revealed mild to moderate left hydroureteronephrosis area.  Urologic consultation is requested.  The patient denies any recent left flank or left back pain.  PMH: Past Medical History:  Diagnosis Date  . Anticoagulant long-term use    xarelto-- due to hx PE's   . Chronic constipation   . Chronic low back pain   . DDD (degenerative disc disease), lumbar   . Frequency of urination   . GERD (gastroesophageal reflux disease)   . H/O endovascular stent graft for abdominal aortic aneurysm followed by dr Scot Dock   04/ 2001  s/p infarenal AAA w/ AneuRx graft stent:  last duplex 08-14-2016 repair patent, AAA sac size 4.5cm x 4.72cm (stable), no endoleak detected  . Hiatal hernia   . History of adenomatous polyp of colon    2010-- tubular adenoma and hyperplastic  . History of basal cell carcinoma excision    yrs ago  . History of BPH   . History of gout    per pt's wife gout stable as of 12-19-2016  . History of pulmonary embolus (PE)    04/ 2011 and 10/ 2014  . History of skin cancer   . Hyperlipidemia   . Hypertension   . Hypothyroidism   . Left ureteral stone   . Mild sleep apnea    per study 06-04-2016 very mild sleep apnea not obstructive , cpap not recommended  . Nocturia   . OA (osteoarthritis)   . RBBB (right bundle branch block)   . Urgency incontinence   .  Vascular dementia   . Vascular parkinsonism [facility])    neurologist-  dr tat  . Wears glasses   . Wears hearing aid    bilateral   . Wears partial dentures    upper    PSH: Past Surgical History:  Procedure Laterality Date  . ABDOMINAL AORTIC ENDOVASCULAR STENT GRAFT  04/ 2001   dr Scot Dock   AneuRx graft stent  . CATARACT EXTRACTION W/ INTRAOCULAR LENS  IMPLANT, BILATERAL  2001 approx.  . CHOLECYSTECTOMY OPEN  1980's  . COLONOSCOPY  last one 01/ 2010  . CYSTOSCOPY/RETROGRADE/URETEROSCOPY/STONE EXTRACTION WITH BASKET Left 12/24/2016   Procedure: CYSTOSCOPY/RETROGRADE/URETEROSCOPY/STONE EXTRACTION WITH BASKET HOLMIUM LASER STENT PLACEMENT;  Surgeon: Festus Aloe, MD;  Location: [facility];  Service: Urology;  Laterality: Left;  . EXCISION/RELEASE BURSA HIP Right 07/15/2014   Procedure: RIGHT HIP BURSECTOMY WITH TENDON REPAIR ;  Surgeon: Gearlean Alf, MD;  Location: [facility];  Service: Orthopedics;  Laterality: Right;  . HOLMIUM LASER APPLICATION Left [id number]   Procedure: HOLMIUM LASER APPLICATION;  Surgeon: Festus Aloe, MD;  Location: [facility];  Service: Urology;  Laterality: Left;  . KNEE ARTHROSCOPY Bilateral 1990's-- right x2 ;  left x1  . POSTERIOR LAMINECTOMY / DECOMPRESSION LUMBAR SPINE  03/24/2002   L2 -- S1  .  POSTERIOR LUMBAR FUSION  11/16/2010   L2 -- L4  . SHOULDER OPEN ROTATOR CUFF REPAIR Bilateral lef 11/15/2009/  right 2013 approx  . TONSILLECTOMY  child  . TOTAL KNEE ARTHROPLASTY  right 08-08-2000/  left 01-28-2003  . TRANSURETHRAL RESECTION OF PROSTATE Left 12/24/2016   Procedure: TRANSURETHRAL RESECTION OF THE PROSTATE (TURP);  Surgeon: Festus Aloe, MD;  Location: [facility];  Service: Urology;  Laterality: Left;    Allergies: Allergies  Allergen Reactions  . Ambien [Zolpidem Tartrate]     Goes wild, gets anxious and confused with ambien    Medications: Prescriptions Prior to Admission  Medication  Sig Dispense Refill Last Dose  . allopurinol (ZYLOPRIM) 300 MG tablet Take 300 mg by mouth every morning.    01/06/2017 at Unknown time  . atorvastatin (LIPITOR) 40 MG tablet Take 1 tablet by mouth at bedtime.    01/05/2017 at Unknown time  . carbidopa-levodopa (SINEMET IR) 25-100 MG tablet TAKE 1 TABLET BY MOUTH 3 (THREE) TIMES DAILY. (Patient taking differently: Take 1 tablet by mouth 3 (three) times daily. TAKE 1 TABLET BY MOUTH 3 (THREE) TIMES DAILY.) 90 tablet 5 01/06/2017 at Unknown time  . escitalopram (LEXAPRO) 10 MG tablet Take 10 mg by mouth every morning.   1 01/06/2017 at Unknown time  . HYDROcodone-acetaminophen (NORCO/VICODIN) 5-325 MG tablet TAKE 2 TABLETs EVERY 4 HOURS AS NEEDED FOR PAIN  0 01/05/2017 at Unknown time  . levothyroxine (SYNTHROID, LEVOTHROID) 25 MCG tablet Take 25 mcg by mouth daily before breakfast.   6 01/06/2017 at Unknown time  . naproxen sodium (ANAPROX) 220 MG tablet Take 220 mg by mouth daily.   01/06/2017 at Unknown time  . nebivolol (BYSTOLIC) 10 MG tablet Take 10 mg by mouth every morning.   01/06/2017 at 0930  . rivaroxaban (XARELTO) 20 MG TABS tablet Take 1 tablet (20 mg total) by mouth daily with supper. 30 tablet 3 01/05/2017 at 1800  . Sennosides (EX-LAX) 15 MG TABS Take 15 mg by mouth as needed (constipation).    Past Week at Unknown time  . tamsulosin (FLOMAX) 0.4 MG CAPS capsule Take 0.4 mg by mouth at bedtime.   01/05/2017 at Unknown time  . [DISCONTINUED] cephALEXin (KEFLEX) 500 MG capsule Take 1 capsule (500 mg total) by mouth 2 (two) times daily. (Patient not taking: Reported on 01/06/2017) 10 capsule 0 Completed Course at Unknown time     Social History: Social History   Social History  . Marital status: Married    Spouse name: N/A  . Number of children: N/A  . Years of education: N/A   Occupational History  . Retired     [employer], parts dept   Social History Main Topics  . Smoking status: Former Smoker    Years: 20.00    Types:  Cigarettes    Quit date: 10/01/1974  . Smokeless tobacco: Never Used  . Alcohol use No  . Drug use: No  . Sexual activity: Not on file   Other Topics Concern  . Not on file   Social History Narrative   Married   2 daughters, 1 son    Family History: Family History  Problem Relation Age of Onset  . Hyperlipidemia Mother   . Hypertension Mother   . Hyperlipidemia Father   . Hypertension Father   . Heart disease Father   . Heart attack Father   . Cancer Sister   . Hyperlipidemia Brother   . Hypertension Daughter  Review of Systems: Positive: Fever, mental status changes.  There has been, by report, cloudy urine. Negative:   A further 10 point review of systems was negative except what is listed in the HPI.  Physical Exam: @VITALS2 @ General: No acute distress.  Awake.  He answers questions appropriately. Head:  Normocephalic.  Atraumatic. ENT:  EOMI.  Mucous membranes moist Neck:  Supple.  No lymphadenopathy. CV:  S1 present. S2 present. Regular rate. Pulmonary: Equal effort bilaterally.  Clear to auscultation bilaterally. Abdomen: Soft.  Non- tender to palpation.  There is no CVA tenderness. Skin:  Normal turgor.  No visible rash. Extremity: No gross deformity of bilateral upper extremities.  No gross deformity of    bilateral lower extremities. Neurologic: Alert. Appropriate mood.  Penis:    No lesions. Urethra:  Foley catheter in place.  Orthotopic meatus. Scrotum: No lesions.  No ecchymosis.  No erythema. Testicles: Descended bilaterally.  No masses bilaterally. Epididymis: Palpable bilaterally.  Non Tender to palpation.  Studies:  Recent Labs     01/06/17  1208  HGB  10.3*  WBC  9.5  PLT  150    Recent Labs     01/06/17  1208  NA  138  K  4.0  CL  101  CO2  25  BUN  20  CREATININE  1.33*  CALCIUM  8.5*  GFRNONAA  49*  GFRAA  57*     Recent Labs     01/06/17  1208  INR  2.10   I have reviewed the patient's CT images.  There is prompt  excretion of contrast.  Left equals right.  There is moderate dilatation of the left pyelocalyceal system as well as the left ureter, however.  There is no significant perinephric stranding that is new.  Labs were reviewed.  Invalid input(s): ABG    Assessment:  1.  Status post recent left ureteroscopic stone management with holmium laser and stent placement.  He is stone free on that side.  2.  Mild/moderate left hydroureteronephrosis.  There is prompt excretion of contrast on that side, not indicative of significant obstruction.  3.  Probable sepsis-patient febrile, but white count 9500.  Questionable urologic source  Plan: 1.  I do not necessarily think that the patient has significant obstruction on the left, as he does have off excretion of contrast on that side.  I don't think that he needs urgent/emergent decompression with the stent.  2.  As he is not having significant pain, I think follow-up with ultrasound as an outpatient would be adequate to follow his upper urinary tract on the left.  3.  If he does not have significant improvement with current antibiotic management of an infectious process that actually may not be of urologic etiology, consider stent placement at that time.  4.  We will follow along with you.    Pager:[phone]

## 2017-01-06 NOTE — H&P (Signed)
History and Physical    Christian Wiley [id number] DOB: 06/17/1935  DOA: 01/06/2017 PCP: Geoffery Lyons, MD  Patient coming from: Home   Chief Complaint:   HPI: Christian Wiley is a 82 y.o. male with medical history significant of HTN well controlled, multiples PE, BPH, Chronic back pain, Hypothyroidism and Parkinsonism, presented to the ED with alter mental status, fever and decrease urine output. Patient had a recent lithotripsy with basket capture on 12/24/16. Patient was left with a foley cath after the procedure, he was prescribe a course of cipro which he completed. Over the past 2 weeks the patient has had 3 catheter changes, 2 time by urology and once in the ED 5 day PTA.   Wife report that 4 PTA patient has been more confuses and 2 days PTA patient started developing fever, with some changes in urine color - mild blood tinge was noted. Patient have no complaints at this time. Denies chest pain, SOB, dysuria, and abdominal pain.   ED Course: Found to be tachycardic and hypotensive, Lactic acid 3.7 UA positive for UTI. IVF given BP responded well. Cr 1.33. CT Abd/Pelv show multiple stones and mild left hydronephrosis. Urology was consulted by EDP   Review of Systems:   General: no changes in body weight, no fever chills or decrease in energy.  HEENT: no blurry vision, hearing changes or sore throat Respiratory: no dyspnea, coughing, wheezing CV: no chest pain, no palpitations GI: no nausea, vomiting, abdominal pain, diarrhea, constipation GU: See HPI  Ext:. No deformities  Neuro: + Confusion, no unilateral weakness, numbness, or tingling, no vision change or hearing loss Skin: No rashes, lesions or wounds. MSK: No muscle spasm, no deformity, no limitation of range of movement in spin Heme: No easy bruising.  Travel history: No recent long distant travel.   Past Medical History:  Diagnosis Date  . Anticoagulant long-term use    xarelto-- due to hx PE's   . Chronic  constipation   . Chronic low back pain   . DDD (degenerative disc disease), lumbar   . Frequency of urination   . GERD (gastroesophageal reflux disease)   . H/O endovascular stent graft for abdominal aortic aneurysm followed by dr Scot Dock   04/ 2001  s/p infarenal AAA w/ AneuRx graft stent:  last duplex 08-14-2016 repair patent, AAA sac size 4.5cm x 4.72cm (stable), no endoleak detected  . Hiatal hernia   . History of adenomatous polyp of colon    2010-- tubular adenoma and hyperplastic  . History of basal cell carcinoma excision    yrs ago  . History of BPH   . History of gout    per pt's wife gout stable as of 12-19-2016  . History of pulmonary embolus (PE)    04/ 2011 and 10/ 2014  . History of skin cancer   . Hyperlipidemia   . Hypertension   . Hypothyroidism   . Left ureteral stone   . Mild sleep apnea    per study 06-04-2016 very mild sleep apnea not obstructive , cpap not recommended  . Nocturia   . OA (osteoarthritis)   . RBBB (right bundle branch block)   . Urgency incontinence   . Vascular dementia   . Vascular parkinsonism [facility])    neurologist-  dr tat  . Wears glasses   . Wears hearing aid    bilateral   . Wears partial dentures    upper    Past Surgical History:  Procedure Laterality Date  .  ABDOMINAL AORTIC ENDOVASCULAR STENT GRAFT  04/ 2001   dr Scot Dock   AneuRx graft stent  . CATARACT EXTRACTION W/ INTRAOCULAR LENS  IMPLANT, BILATERAL  2001 approx.  . CHOLECYSTECTOMY OPEN  1980's  . COLONOSCOPY  last one 01/ 2010  . CYSTOSCOPY/RETROGRADE/URETEROSCOPY/STONE EXTRACTION WITH BASKET Left 12/24/2016   Procedure: CYSTOSCOPY/RETROGRADE/URETEROSCOPY/STONE EXTRACTION WITH BASKET HOLMIUM LASER STENT PLACEMENT;  Surgeon: Festus Aloe, MD;  Location: [facility];  Service: Urology;  Laterality: Left;  . EXCISION/RELEASE BURSA HIP Right 07/15/2014   Procedure: RIGHT HIP BURSECTOMY WITH TENDON REPAIR ;  Surgeon: Gearlean Alf, MD;  Location: [facility];   Service: Orthopedics;  Laterality: Right;  . HOLMIUM LASER APPLICATION Left [id number]   Procedure: HOLMIUM LASER APPLICATION;  Surgeon: Festus Aloe, MD;  Location: [facility];  Service: Urology;  Laterality: Left;  . KNEE ARTHROSCOPY Bilateral 1990's-- right x2 ;  left x1  . POSTERIOR LAMINECTOMY / DECOMPRESSION LUMBAR SPINE  03/24/2002   L2 -- S1  . POSTERIOR LUMBAR FUSION  11/16/2010   L2 -- L4  . SHOULDER OPEN ROTATOR CUFF REPAIR Bilateral lef 11/15/2009/  right 2013 approx  . TONSILLECTOMY  child  . TOTAL KNEE ARTHROPLASTY  right 08-08-2000/  left 01-28-2003  . TRANSURETHRAL RESECTION OF PROSTATE Left 12/24/2016   Procedure: TRANSURETHRAL RESECTION OF THE PROSTATE (TURP);  Surgeon: Festus Aloe, MD;  Location: [facility];  Service: Urology;  Laterality: Left;     reports that he quit smoking about 42 years ago. His smoking use included Cigarettes. He quit after 20.00 years of use. He has never used smokeless tobacco. He reports that he does not drink alcohol or use drugs.  Allergies  Allergen Reactions  . Ambien [Zolpidem Tartrate]     Goes wild, gets anxious and confused with [place]    Family History  Problem Relation Age of Onset  . Hyperlipidemia Mother   . Hypertension Mother   . Hyperlipidemia Father   . Hypertension Father   . Heart disease Father   . Heart attack Father   . Cancer Sister   . Hyperlipidemia Brother   . Hypertension Daughter     Prior to Admission medications   Medication Sig Start Date End Date Taking? Authorizing Provider  allopurinol (ZYLOPRIM) 300 MG tablet Take 300 mg by mouth every morning.    Yes Historical Provider, MD  atorvastatin (LIPITOR) 40 MG tablet Take 1 tablet by mouth at bedtime.  06/25/13  Yes Historical Provider, MD  carbidopa-levodopa (SINEMET IR) 25-100 MG tablet TAKE 1 TABLET BY MOUTH 3 (THREE) TIMES DAILY. Patient taking differently: Take 1 tablet by mouth 3 (three) times daily. TAKE 1  TABLET BY MOUTH 3 (THREE) TIMES DAILY. 11/27/16  Yes Rebecca S Tat, DO  escitalopram (LEXAPRO) 10 MG tablet Take 10 mg by mouth every morning.  05/20/16  Yes Historical Provider, MD  HYDROcodone-acetaminophen (NORCO/VICODIN) 5-325 MG tablet TAKE 2 TABLETs EVERY 4 HOURS AS NEEDED FOR PAIN 10/20/15  Yes Historical Provider, MD  levothyroxine (SYNTHROID, LEVOTHROID) 25 MCG tablet Take 25 mcg by mouth daily before breakfast.  12/05/15  Yes Historical Provider, MD  naproxen sodium (ANAPROX) 220 MG tablet Take 220 mg by mouth daily.   Yes Historical Provider, MD  nebivolol (BYSTOLIC) 10 MG tablet Take 10 mg by mouth every morning.   Yes Historical Provider, MD  rivaroxaban (XARELTO) 20 MG TABS tablet Take 1 tablet (20 mg total) by mouth daily with supper. 12/25/16  Yes Festus Aloe, MD  Sennosides (  EX-LAX) 15 MG TABS Take 15 mg by mouth as needed (constipation).    Yes Historical Provider, MD  tamsulosin (FLOMAX) 0.4 MG CAPS capsule Take 0.4 mg by mouth at bedtime. 01/01/17  Yes Historical Provider, MD  cephALEXin (KEFLEX) 500 MG capsule Take 1 capsule (500 mg total) by mouth 2 (two) times daily. Patient not taking: Reported on 01/06/2017 12/24/16   Festus Aloe, MD    Physical Exam: Vitals:   01/06/17 1152 01/06/17 1159 01/06/17 1430 01/06/17 1635  BP: (!) 94/52  (!) 112/43 (!) 168/145  Pulse: 83  73 63  Resp: 24  26 16   Temp: (!) 103.1 F (39.5 C)     TempSrc: Oral     SpO2: 91% 94% 96% 96%  Weight:  102.5 kg (226 lb)    Height:  6' (1.829 m)       Constitutional: NAD, calm, comfortable Eyes: PERRL, lids and conjunctivae normal ENMT: Mucous membranes are moist.  Neck: normal, supple, no masses, no thyromegaly Respiratory: clear to auscultation bilaterally, no wheezing, no crackles. Normal respiratory effort. Cardiovascular: Regular rate and rhythm, no murmurs / rubs / gallops. No extremity edema. 2+ pedal pulses. Abdomen: no tenderness, no masses palpated. No hepatosplenomegaly. Bowel  sounds positive.  Genitourinary: Foley cath in place, urine yellow. Penis normal with no signs of infection.  Musculoskeletal: no clubbing / cyanosis. No joint deformity upper and lower extremities. Good ROM, no contractures.  Skin: no rashes, lesions, ulcers. No induration Neurologic: CN 2-12 grossly intact.  Psychiatric: Normal judgment and insight. Alert and oriented x 3. Normal mood.    Labs on Admission: I have personally reviewed following labs and imaging studies  CBC:  Recent Labs Lab 01/01/17 0454 01/06/17 1208  WBC  --  9.5  NEUTROABS  --  7.9*  HGB 11.2* 10.3*  HCT 33.0* 31.2*  MCV  --  88.6  PLT  --  [id number]   Basic Metabolic Panel:  Recent Labs Lab 01/01/17 0454 01/06/17 1208  NA 134* 138  K 3.9 4.0  CL 96* 101  CO2  --  25  GLUCOSE 142* 231*  BUN 19 20  CREATININE 1.30* 1.33*  CALCIUM  --  8.5*   GFR: Estimated Creatinine Clearance: 54.9 mL/min (by C-G formula based on SCr of 1.33 mg/dL (H)). Liver Function Tests:  Recent Labs Lab 01/06/17 1208  AST 40  ALT 12*  ALKPHOS 82  BILITOT 1.4*  PROT 6.0*  ALBUMIN 3.1*   No results for input(s): LIPASE, AMYLASE in the last 168 hours. No results for input(s): AMMONIA in the last 168 hours. Coagulation Profile:  Recent Labs Lab 01/06/17 1208  INR 2.10   Cardiac Enzymes: No results for input(s): CKTOTAL, CKMB, CKMBINDEX, TROPONINI in the last 168 hours. BNP (last 3 results) No results for input(s): PROBNP in the last 8760 hours. HbA1C: No results for input(s): HGBA1C in the last 72 hours. CBG: No results for input(s): GLUCAP in the last 168 hours. Lipid Profile: No results for input(s): CHOL, HDL, LDLCALC, TRIG, CHOLHDL, LDLDIRECT in the last 72 hours. Thyroid Function Tests: No results for input(s): TSH, T4TOTAL, FREET4, T3FREE, THYROIDAB in the last 72 hours. Anemia Panel: No results for input(s): VITAMINB12, FOLATE, FERRITIN, TIBC, IRON, RETICCTPCT in the last 72 hours. Urine analysis:      Component Value Date/Time   COLORURINE YELLOW 01/06/2017 1200   APPEARANCEUR TURBID (A) 01/06/2017 1200   LABSPEC 1.010 01/06/2017 1200   PHURINE 8.5 (H) 01/06/2017 1200   GLUCOSEU NEGATIVE  01/06/2017 1200   HGBUR LARGE (A) 01/06/2017 1200   BILIRUBINUR NEGATIVE 01/06/2017 1200   KETONESUR NEGATIVE 01/06/2017 1200   PROTEINUR 100 (A) 01/06/2017 1200   UROBILINOGEN 1.0 11/12/2010 0820   NITRITE NEGATIVE 01/06/2017 1200   LEUKOCYTESUR LARGE (A) 01/06/2017 1200   Sepsis Labs: !!!!!!!!!!!!!!!!!!!!!!!!!!!!!!!!!!!!!!!!!!!! @LABRCNTIP (procalcitonin:4,lacticidven:4) )No results found for this or any previous visit (from the past 240 hour(s)).   Radiological Exams on Admission: Dg Chest 2 View  Result Date: 01/06/2017 CLINICAL DATA:  Altered mental status, fever to 101.2 degrees, cloudy urine. History of Parkinson's disease. EXAM: CHEST  2 VIEW COMPARISON:  Chest x-ray of July 08, 2014 FINDINGS: The lungs are adequately inflated. There is hazy increased density at the left lung base. The heart is top-normal in size. The central pulmonary vascularity is prominent with mild cephalization. There is calcification in the wall of the aortic arch. There may be a trace left pleural effusion. The observed bony thorax is unremarkable. IMPRESSION: Patchy increased density in the left lower lobe consistent with atelectasis or early pneumonia. Possible trace pleural effusion. Low-grade compensated CHF. Followup PA and lateral chest X-ray is recommended in 3-4 weeks following trial of antibiotic therapy to ensure resolution. Electronically Signed   By: David  Martinique M.D.   On: 01/06/2017 13:22   Ct Abdomen Pelvis W Contrast  Result Date: 01/06/2017 CLINICAL DATA:  82 y/o M; kidney stones removed 12/24/2016 followed by catheter removal, hematuria, and replacement of catheter. Fever today. EXAM: CT ABDOMEN AND PELVIS WITH CONTRAST TECHNIQUE: Multidetector CT imaging of the abdomen and pelvis was performed using  the standard protocol following bolus administration of intravenous contrast. CONTRAST:  164mL ISOVUE-300 IOPAMIDOL (ISOVUE-300) INJECTION 61% COMPARISON:  11/18/2016 CT of the abdomen and pelvis. FINDINGS: Lower chest: No acute abnormality. Hepatobiliary: Status post cholecystectomy. Mild intrahepatic biliary ductal dilatation is increased from the prior study no obstructing stone or lesion identified. The common bile duct measures up to 10 mm and is stable. No focal liver lesion. Pancreas: Unremarkable. No pancreatic ductal dilatation or surrounding inflammatory changes. Spleen: Normal in size without focal abnormality. Adrenals/Urinary Tract: Normal adrenal glands. Symmetric enhancement of the kidneys. Multiple nonobstructing kidney stones bilaterally. Stable nonobstructing stone within the right renal pelvis. No right-sided hydronephrosis. Mild left hydronephrosis new from the prior study with mild urothelial enhancement and without obstructing stone. The bladder is collapsed around Foley catheter. Stomach/Bowel: Stomach is within normal limits. Appendix appears normal. No evidence of bowel wall thickening, distention, or inflammatory changes. Extensive sigmoid diverticulosis without evidence for diverticulitis. Vascular/Lymphatic: Stable infrarenal abdominal aortic aneurysm measuring up to 4.9 cm. Aortic and bilateral iliac stents in situ. Aortic atherosclerosis. Reproductive: Stable prostate enlargement. Other: No ascites. Musculoskeletal: Severe lumbar spondylosis post L2 through L4 interbody fusion and L3 through L5 laminectomy. Bilateral hip osteoarthrosis. IMPRESSION: 1. Mild left-sided hydronephrosis with urothelial enhancement and without obstructing stone may represent a collecting system infection. Symmetric enhancement of the kidneys. No rim enhancing fluid collection identified. 2. Multiple nonobstructing kidney stones bilaterally including stable stone in the right renal pelvis. 3. Mild intrahepatic  biliary ductal dilatation, slightly increased from prior study, may be compensatory post cholecystectomy, correlation with liver enzymes is recommended. 4. Sigmoid diverticulosis without evidence for diverticulitis. 5. Stable abdominal aortic aneurysm measuring up to 4.9 cm post aortic and bilateral iliac stenting. Electronically Signed   By: Kristine Garbe M.D.   On: 01/06/2017 14:23    EKG: ordered - pending   Assessment/Plan Urosepsis 2/2 foley catheter - meeting sepsis criteria. Lactic  acid elevated, hypotensive, UA positive   Admit to tele  Sepsis protocol - Given Vanc and cefepime in ED switched to [place] Follow up urine and blood cultures  Tylenol PRN fever  Will keep foley in place until urology eval IVF  Follow up lactic acid  Check am labs   AKI - prerenal 2/2 infectious process and dehydration, baseline Cr 0.90 IVF  Check Cr in AM   Avoid nephrotoxic agents   Nephrolithiasis s/p ESWL 12/24/16 Urology consulted by EDP  See above  Hx PE Continue Xarelto  HTN - initially hypotensive  Responded well to IVF  Hold BP medications   BPH  Resume Flomax   Chronic back pain  Tylenol PRN  Can use ultram if needed  Parkinsonism  Continue Sinemet   Hypothyroidism  Continue Synthroid  DVT prophylaxis: Xarelto  Code Status: FULL  Family Communication: Wife and daughter at bedside  Disposition Plan: Anticipate discharge to previous home environment.  Consults called: Urology consulted by EDP  Admission status: Inpatient Tele  Chipper Oman MD [facility] Pager [phone]  If 7PM-7AM, please contact night-coverage [email or web address] Password [facility]  01/06/2017, 4:44 PM

## 2017-01-06 NOTE — Progress Notes (Addendum)
Pharmacy Antibiotic Note  Christian Wiley is a 82 y.o. male admitted on 01/06/2017 with UTI.  Pharmacy has been consulted for Cefepime and vancomycin dosing. Patient with foley catheter and s/p lithotripsy 1/16.   Plan:  Cefepime 2gm X 1 in ED then 1gm IV q8h for UTI  Vancomcin 1gm x 1 then vancomycin 1250mg  IV q24h (start dose this evening to complete a staggered loading dose)  Follow UCx - For patients with chronic foley, foley should be changed prior to collecting urine culture since foley quickly become colonized  Monitor renal function and check level if remains on vancomycin > 4 days on daily dosing  Height: 6' (182.9 cm) Weight: 226 lb (102.5 kg) IBW/kg (Calculated) : 77.6  Temp (24hrs), Avg:103.1 F (39.5 C), Min:103.1 F (39.5 C), Max:103.1 F (39.5 C)   Recent Labs Lab 01/01/17 0454  CREATININE 1.30*    Estimated Creatinine Clearance: 56.2 mL/min (by C-G formula based on SCr of 1.3 mg/dL (H)).    Allergies  Allergen Reactions  . Ambien [Zolpidem Tartrate]     Goes wild, gets anxious and confused with ambien    Antimicrobials this admission: 1/29 >> cefepime 1/29 >> vancomycin   Dose adjustments this admission:  Microbiology results: 1/29 UCx;  1/29 BCx:   Thank you for allowing pharmacy to be a part of this patient's care.  Doreene Eland, PharmD, BCPS.   Pager: [phone] 01/06/2017 12:57 PM

## 2017-01-06 NOTE — ED Provider Notes (Signed)
Emergency Department Provider Note   I have reviewed the triage vital signs and the nursing notes.   HISTORY  Chief Complaint Altered Mental Status   HPI Christian Wiley is a 82 y.o. male with PMH of DDD, GERD, HLD, HTN, parkinsonism presents to the ED for evaluation of confusion and fever in the setting of recent lithotripsy with basket capture on the left. Family states he's been confused over the past several days with sweats and shaking chills over the past 24 hours. He is been very confused and grabbing at things that aren't there. He's had material that resolved since the procedure. The wife notes some decreased urine output in his Foley catheter. She also notes some difficulty breathing and dry cough that began this morning.  Level 5 caveat: Patient with acute delirium and cannot provide additional history.    Past Medical History:  Diagnosis Date  . Anticoagulant Cedarius Kersh-term use    xarelto-- due to hx PE's   . Chronic constipation   . Chronic low back pain   . DDD (degenerative disc disease), lumbar   . Frequency of urination   . GERD (gastroesophageal reflux disease)   . H/O endovascular stent graft for abdominal aortic aneurysm followed by dr Scot Dock   04/ 2001  s/p infarenal AAA w/ AneuRx graft stent:  last duplex 08-14-2016 repair patent, AAA sac size 4.5cm x 4.72cm (stable), no endoleak detected  . Hiatal hernia   . History of adenomatous polyp of colon    2010-- tubular adenoma and hyperplastic  . History of basal cell carcinoma excision    yrs ago  . History of BPH   . History of gout    per pt's wife gout stable as of 12-19-2016  . History of pulmonary embolus (PE)    04/ 2011 and 10/ 2014  . History of skin cancer   . Hyperlipidemia   . Hypertension   . Hypothyroidism   . Left ureteral stone   . Mild sleep apnea    per study 06-04-2016 very mild sleep apnea not obstructive , cpap not recommended  . Nocturia   . OA (osteoarthritis)   . RBBB (right  bundle branch block)   . Urgency incontinence   . Vascular dementia   . 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 Wears glasses   . Wears hearing aid    bilateral   . Wears partial dentures    upper    Patient Active Problem List   Diagnosis Date Noted  . Sepsis ([place]) 01/06/2017  . Sepsis secondary to UTI ([place]) 01/06/2017  . PBA (pseudobulbar affect) 06/24/2016  . Vascular dementia 06/24/2016  . Greater trochanteric bursitis of right hip 07/15/2014  . Bursitis of hip 07/15/2014  . Pulmonary embolism ([place]) 10/01/2013    Class: Acute  . Thrombocytopenia ([place]) 10/01/2013    Class: Acute  . Hypertension 10/01/2013    Class: Chronic  . Abdominal aneurysm without mention of rupture 01/15/2012    Past Surgical History:  Procedure Laterality Date  . ABDOMINAL AORTIC ENDOVASCULAR STENT GRAFT  04/ 2001   dr Scot Dock   AneuRx graft stent  . CATARACT EXTRACTION W/ INTRAOCULAR LENS  IMPLANT, BILATERAL  2001 approx.  . CHOLECYSTECTOMY OPEN  1980's  . COLONOSCOPY  last one 01/ 2010  . CYSTOSCOPY/RETROGRADE/URETEROSCOPY/STONE EXTRACTION WITH BASKET Left 12/24/2016   Procedure: CYSTOSCOPY/RETROGRADE/URETEROSCOPY/STONE EXTRACTION WITH BASKET HOLMIUM LASER STENT PLACEMENT;  Surgeon: Festus Aloe, MD;  Location: [facility];  Service: Urology;  Laterality: Left;  . EXCISION/RELEASE BURSA HIP Right 07/15/2014   Procedure: RIGHT HIP BURSECTOMY WITH TENDON REPAIR ;  Surgeon: Gearlean Alf, MD;  Location: [facility];  Service: Orthopedics;  Laterality: Right;  . HOLMIUM LASER APPLICATION Left [id number]   Procedure: HOLMIUM LASER APPLICATION;  Surgeon: Festus Aloe, MD;  Location: [facility];  Service: Urology;  Laterality: Left;  . KNEE ARTHROSCOPY Bilateral 1990's-- right x2 ;  left x1  . POSTERIOR LAMINECTOMY / DECOMPRESSION LUMBAR SPINE  03/24/2002   L2 -- S1  . POSTERIOR LUMBAR FUSION  11/16/2010   L2 -- L4  . SHOULDER OPEN ROTATOR CUFF REPAIR  Bilateral lef 11/15/2009/  right 2013 approx  . TONSILLECTOMY  child  . TOTAL KNEE ARTHROPLASTY  right 08-08-2000/  left 01-28-2003  . TRANSURETHRAL RESECTION OF PROSTATE Left 12/24/2016   Procedure: TRANSURETHRAL RESECTION OF THE PROSTATE (TURP);  Surgeon: Festus Aloe, MD;  Location: [facility];  Service: Urology;  Laterality: Left;      Allergies Ambien [zolpidem tartrate]  Family History  Problem Relation Age of Onset  . Hyperlipidemia Mother   . Hypertension Mother   . Hyperlipidemia Father   . Hypertension Father   . Heart disease Father   . Heart attack Father   . Cancer Sister   . Hyperlipidemia Brother   . Hypertension Daughter     Social History Social History  Substance Use Topics  . Smoking status: Former Smoker    Years: 20.00    Types: Cigarettes    Quit date: 10/01/1974  . Smokeless tobacco: Never Used  . Alcohol use No    Review of Systems  Level 5 caveat: delirium  ____________________________________________   PHYSICAL EXAM:  VITAL SIGNS: ED Triage Vitals  Enc Vitals Group     BP 01/06/17 1152 (!) 94/52     Pulse Rate 01/06/17 1152 83     Resp 01/06/17 1152 24     Temp 01/06/17 1152 (!) 103.1 F (39.5 C)     Temp Source 01/06/17 1152 Oral     SpO2 01/06/17 1152 91 %     Weight 01/06/17 1159 226 lb (102.5 kg)     Height 01/06/17 1159 6' (1.829 m)     Pain Score 01/06/17 1159 0   Constitutional: Alert but picking at clothes and in the air.  Eyes: Conjunctivae are normal.  Head: Atraumatic. Nose: No congestion/rhinnorhea. Mouth/Throat: Mucous membranes are very dry.  Neck: No stridor.   Cardiovascular: Normal rate, regular rhythm. Good peripheral circulation. Grossly normal heart sounds.   Respiratory: Normal respiratory effort.  No retractions. Lungs CTAB. Gastrointestinal: Soft with diffuse tenderness. No rebound or guarding. No distention.  Musculoskeletal: No lower extremity tenderness nor edema. No gross  deformities of extremities. Neurologic: Confused and appears acutely delirious. No gross focal neurologic deficits are appreciated.  Skin:  Skin is warm, dry and intact. No rash noted.  ____________________________________________   LABS (all labs ordered are listed, but only abnormal results are displayed)  Labs Reviewed  COMPREHENSIVE METABOLIC PANEL - Abnormal; Notable for the following:       Result Value   Glucose, Bld 231 (*)    Creatinine, Ser 1.33 (*)    Calcium 8.5 (*)    Total Protein 6.0 (*)    Albumin 3.1 (*)    ALT 12 (*)    Total Bilirubin 1.4 (*)    GFR calc non Af Amer 49 (*)    GFR calc Af  Amer 57 (*)    All other components within normal limits  CBC WITH DIFFERENTIAL/PLATELET - Abnormal; Notable for the following:    RBC 3.52 (*)    Hemoglobin 10.3 (*)    HCT 31.2 (*)    Neutro Abs 7.9 (*)    All other components within normal limits  PROTIME-INR - Abnormal; Notable for the following:    Prothrombin Time 23.9 (*)    All other components within normal limits  URINALYSIS, ROUTINE W REFLEX MICROSCOPIC - Abnormal; Notable for the following:    APPearance TURBID (*)    pH 8.5 (*)    Hgb urine dipstick LARGE (*)    Protein, ur 100 (*)    Leukocytes, UA LARGE (*)    All other components within normal limits  URINALYSIS, MICROSCOPIC (REFLEX) - Abnormal; Notable for the following:    Bacteria, UA MANY (*)    Squamous Epithelial / LPF 0-5 (*)    Crystals TRIPLE PHOSPHATE CRYSTALS (*)    All other components within normal limits  COMPREHENSIVE METABOLIC PANEL - Abnormal; Notable for the following:    Glucose, Bld 115 (*)    Calcium 7.8 (*)    Total Protein 5.5 (*)    Albumin 2.6 (*)    AST 54 (*)    ALT <5 (*)    GFR calc non Af Amer 56 (*)    All other components within normal limits  CBC - Abnormal; Notable for the following:    RBC 2.99 (*)    Hemoglobin 9.1 (*)    HCT 27.2 (*)    Platelets 127 (*)    All other components within normal limits    TROPONIN I - Abnormal; Notable for the following:    Troponin I 0.30 (*)    All other components within normal limits  TROPONIN I - Abnormal; Notable for the following:    Troponin I 0.27 (*)    All other components within normal limits  I-STAT CG4 LACTIC ACID, ED - Abnormal; Notable for the following:    Lactic Acid, Venous 3.07 (*)    All other components within normal limits  CULTURE, BLOOD (ROUTINE X 2)  CULTURE, BLOOD (ROUTINE X 2)  URINE CULTURE  INFLUENZA PANEL BY PCR (TYPE A & B)  LACTIC ACID, PLASMA  TROPONIN I   ____________________________________________  EKG  Reviewed EKG in MUSE.   Normal rate and PR/QTc intervals. No STEMI. Similar to prior tracings.  ____________________________________________  RADIOLOGY  Dg Chest 2 View  Result Date: 01/06/2017 CLINICAL DATA:  Altered mental status, fever to 101.2 degrees, cloudy urine. History of Parkinson's disease. EXAM: CHEST  2 VIEW COMPARISON:  Chest x-ray of July 08, 2014 FINDINGS: The lungs are adequately inflated. There is hazy increased density at the left lung base. The heart is top-normal in size. The central pulmonary vascularity is prominent with mild cephalization. There is calcification in the wall of the aortic arch. There may be a trace left pleural effusion. The observed bony thorax is unremarkable. IMPRESSION: Patchy increased density in the left lower lobe consistent with atelectasis or early pneumonia. Possible trace pleural effusion. Low-grade compensated CHF. Followup PA and lateral chest X-ray is recommended in 3-4 weeks following trial of antibiotic therapy to ensure resolution. Electronically Signed   By: David  Martinique M.D.   On: 01/06/2017 13:22   Ct Abdomen Pelvis W Contrast  Result Date: 01/06/2017 CLINICAL DATA:  82 y/o M; kidney stones removed 12/24/2016 followed by catheter removal, hematuria, and  replacement of catheter. Fever today. EXAM: CT ABDOMEN AND PELVIS WITH CONTRAST TECHNIQUE:  Multidetector CT imaging of the abdomen and pelvis was performed using the standard protocol following bolus administration of intravenous contrast. CONTRAST:  159mL ISOVUE-300 IOPAMIDOL (ISOVUE-300) INJECTION 61% COMPARISON:  11/18/2016 CT of the abdomen and pelvis. FINDINGS: Lower chest: No acute abnormality. Hepatobiliary: Status post cholecystectomy. Mild intrahepatic biliary ductal dilatation is increased from the prior study no obstructing stone or lesion identified. The common bile duct measures up to 10 mm and is stable. No focal liver lesion. Pancreas: Unremarkable. No pancreatic ductal dilatation or surrounding inflammatory changes. Spleen: Normal in size without focal abnormality. Adrenals/Urinary Tract: Normal adrenal glands. Symmetric enhancement of the kidneys. Multiple nonobstructing kidney stones bilaterally. Stable nonobstructing stone within the right renal pelvis. No right-sided hydronephrosis. Mild left hydronephrosis new from the prior study with mild urothelial enhancement and without obstructing stone. The bladder is collapsed around Foley catheter. Stomach/Bowel: Stomach is within normal limits. Appendix appears normal. No evidence of bowel wall thickening, distention, or inflammatory changes. Extensive sigmoid diverticulosis without evidence for diverticulitis. Vascular/Lymphatic: Stable infrarenal abdominal aortic aneurysm measuring up to 4.9 cm. Aortic and bilateral iliac stents in situ. Aortic atherosclerosis. Reproductive: Stable prostate enlargement. Other: No ascites. Musculoskeletal: Severe lumbar spondylosis post L2 through L4 interbody fusion and L3 through L5 laminectomy. Bilateral hip osteoarthrosis. IMPRESSION: 1. Mild left-sided hydronephrosis with urothelial enhancement and without obstructing stone may represent a collecting system infection. Symmetric enhancement of the kidneys. No rim enhancing fluid collection identified. 2. Multiple nonobstructing kidney stones bilaterally  including stable stone in the right renal pelvis. 3. Mild intrahepatic biliary ductal dilatation, slightly increased from prior study, may be compensatory post cholecystectomy, correlation with liver enzymes is recommended. 4. Sigmoid diverticulosis without evidence for diverticulitis. 5. Stable abdominal aortic aneurysm measuring up to 4.9 cm post aortic and bilateral iliac stenting. Electronically Signed   By: Kristine Garbe M.D.   On: 01/06/2017 14:23    ____________________________________________   PROCEDURES  Procedure(s) performed:   Procedures  CRITICAL CARE Performed by: Margette Fast Total critical care time: 40 minutes Critical care time was exclusive of separately billable procedures and treating other patients. Critical care was necessary to treat or prevent imminent or life-threatening deterioration. Critical care was time spent personally by me on the following activities: development of treatment plan with patient and/or surrogate as well as nursing, discussions with consultants, evaluation of patient's response to treatment, examination of patient, obtaining history from patient or surrogate, ordering and performing treatments and interventions, ordering and review of laboratory studies, ordering and review of radiographic studies, pulse oximetry and re-evaluation of patient's condition.  Nanda Quinton, MD Emergency Medicine ____________________________________________   INITIAL IMPRESSION / ASSESSMENT AND PLAN / ED COURSE  Pertinent labs & imaging results that were available during my care of the patient were reviewed by me and considered in my medical decision making (see chart for details).  Patient appears to the ED with fever, borderline hypotension, and AMS. He recently had a kidney stone that required lithotripsy and basket extraction on the left. Also noted to have increased SOB this AM and borderline O2 requirement in the ED. Code Sepsis initiated. Will  begin with Cefepime and Vancomycin with concern for urinary source. Will obtain CT abdomen/pelvis with recent urology procedure and abdominal pain. Plan for 52ml/kg bolus and updated family.   Spoke with urology regarding the patient's recent urologic procedure and sepsis. They plan to consult but did not feel this requires any  acute urology intervention. Plan for hospitalist admission.   Discussed patient's case with hospitalist, Dr. Quincy Simmonds.  Recommend admission to tele, inpatient bed.  I will place holding orders per their request. Patient and family (if present) updated with plan. Care transferred to hospitalist service.  I reviewed all nursing notes, vitals, pertinent old records, EKGs, labs, imaging (as available).  ____________________________________________  FINAL CLINICAL IMPRESSION(S) / ED DIAGNOSES  Final diagnoses:  Altered mental status, unspecified altered mental status type  Sepsis, due to unspecified organism ([place])  Sepsis ([place])     MEDICATIONS GIVEN DURING THIS VISIT:  Medications  iopamidol (ISOVUE-300) 61 % injection (not administered)  tamsulosin (FLOMAX) capsule 0.4 mg (0.4 mg Oral Given 01/06/17 2147)  rivaroxaban (XARELTO) tablet 20 mg (20 mg Oral Given 01/06/17 2147)  senna (SENOKOT) tablet 17.2 mg (not administered)  carbidopa-levodopa (SINEMET IR) 25-100 MG per tablet immediate release 1 tablet (1 tablet Oral Given 01/06/17 2147)  escitalopram (LEXAPRO) tablet 10 mg (not administered)  HYDROcodone-acetaminophen (NORCO/VICODIN) 5-325 MG per tablet 1-2 tablet (1 tablet Oral Given 01/06/17 2345)  levothyroxine (SYNTHROID, LEVOTHROID) tablet 25 mcg (not administered)  0.9 %  sodium chloride infusion ( Intravenous New Bag/Given 01/07/17 [id number])  acetaminophen (TYLENOL) tablet 650 mg (not administered)    Or  acetaminophen (TYLENOL) suppository 650 mg (not administered)  polyethylene glycol (MIRALAX / GLYCOLAX) packet 17 g (not administered)  ondansetron (ZOFRAN) tablet  4 mg (not administered)    Or  ondansetron (ZOFRAN) injection 4 mg (not administered)  meropenem (MERREM) 1 g in sodium chloride 0.9 % 100 mL IVPB (1 g Intravenous Given 01/07/17 0438)  acetaminophen (TYLENOL) tablet 1,000 mg (1,000 mg Oral Given 01/06/17 1238)  sodium chloride 0.9 % bolus 1,000 mL (0 mLs Intravenous Stopped 01/06/17 1419)    And  sodium chloride 0.9 % bolus 1,000 mL (0 mLs Intravenous Stopped 01/06/17 1419)    And  sodium chloride 0.9 % bolus 1,000 mL (0 mLs Intravenous Stopped 01/06/17 1419)    And  sodium chloride 0.9 % bolus 500 mL (0 mLs Intravenous Stopped 01/06/17 1419)  ceFEPIme (MAXIPIME) 2 g in dextrose 5 % 50 mL IVPB (0 g Intravenous Stopped 01/06/17 1317)  iopamidol (ISOVUE-300) 61 % injection 100 mL (100 mLs Intravenous Contrast Given 01/06/17 1355)  vancomycin (VANCOCIN) IVPB 1000 mg/200 mL premix (0 mg Intravenous Stopped 01/06/17 1602)  diphenhydrAMINE (BENADRYL) capsule 25 mg (25 mg Oral Given 01/07/17 0134)     NEW OUTPATIENT MEDICATIONS STARTED DURING THIS VISIT:  None   Note:  This document was prepared using Dragon voice recognition software and may include unintentional dictation errors.  Nanda Quinton, MD Emergency Medicine  Margette Fast, MD 01/07/17 [phone]

## 2017-01-06 NOTE — ED Notes (Signed)
Abnormal lab result MD Knapp have been made aware 

## 2017-01-06 NOTE — ED Triage Notes (Signed)
Per EMS, pt is running a fever of 101.2 and started having AMS today.  EMS noted that urine from foley catheter is cloudy.  Pt is alert and oriented to name only.  Per wife of pt, pt has a hx of Parkinson's Disease but denies alzheimers or dementia.   CBG: 183 BP: 140/53 HR: 82 95% on 4 L NSR on monitor.  22G in left hand.

## 2017-01-06 NOTE — ED Notes (Signed)
Vanc delayed d/t patient being in CT.

## 2017-01-06 NOTE — ED Notes (Signed)
Bed: WA17 Expected date:  Expected time:  Means of arrival:  Comments: EMS 82 yo, AMS

## 2017-01-06 NOTE — Progress Notes (Addendum)
Pharmacy Antibiotic Note  Christian Wiley is a 82 y.o. male admitted on 01/06/2017 with UTI.  Patient initially started on Cefepime and vancomycin in ED. Upon admission this evening, MD adjusting antibiotics to [place] per pharmacy in this patient with foley catheter and s/p lithotripsy 1/16.   Plan:  Meropenem 1g IV q8h.  F/u SCr and cultures.  For patients with chronic foley, foley should be changed prior to collecting urine culture since foley quickly become colonized.  Height: 6' (182.9 cm) Weight: 231 lb 14.8 oz (105.2 kg) IBW/kg (Calculated) : 77.6  Temp (24hrs), Avg:99.8 F (37.7 C), Min:98.1 F (36.7 C), Max:103.1 F (39.5 C)   Recent Labs Lab 01/01/17 0454 01/06/17 1208 01/06/17 1221  WBC  --  9.5  --   CREATININE 1.30* 1.33*  --   LATICACIDVEN  --   --  3.07*    Estimated Creatinine Clearance: 55.5 mL/min (by C-G formula based on SCr of 1.33 mg/dL (H)).    Allergies  Allergen Reactions  . Ambien [Zolpidem Tartrate]     Goes wild, gets anxious and confused with ambien    Antimicrobials this admission: 1/29 >> cefepime >> 1/29 1/29 >> vanco >> 1/29 1/29 >> meropenem >>   Dose adjustments this admission:   Microbiology results: 1/29 UCx: sent 1/29 BCx: sent  Thank you for allowing pharmacy to be a part of this patient's care.  Hershal Coria, PharmD, BCPS Pager: [phone] 01/06/2017 6:33 PM

## 2017-01-07 ENCOUNTER — Other Ambulatory Visit: Payer: Self-pay

## 2017-01-07 LAB — TROPONIN I
TROPONIN I: 0.3 ng/mL — AB (ref <0.03)
Troponin I: 0.27 ng/mL (ref <0.03)
Troponin I: 0.23 ng/mL (ref <0.03)

## 2017-01-07 LAB — COMPREHENSIVE METABOLIC PANEL
ALK PHOS: 70 U/L (ref 38–126)
AST: 54 U/L — AB (ref 15–41)
Albumin: 2.6 g/dL — ABNORMAL LOW (ref 3.5–5.0)
Anion gap: 6 (ref 5–15)
BUN: 19 mg/dL (ref 6–20)
CALCIUM: 7.8 mg/dL — AB (ref 8.9–10.3)
CO2: 26 mmol/L (ref 22–32)
CREATININE: 1.18 mg/dL (ref 0.61–1.24)
Chloride: 108 mmol/L (ref 101–111)
GFR, EST NON AFRICAN AMERICAN: 56 mL/min — AB (ref >60)
Glucose, Bld: 115 mg/dL — ABNORMAL HIGH (ref 65–99)
Potassium: 3.6 mmol/L (ref 3.5–5.1)
SODIUM: 140 mmol/L (ref 135–145)
Total Bilirubin: 0.7 mg/dL (ref 0.3–1.2)
Total Protein: 5.5 g/dL — ABNORMAL LOW (ref 6.5–8.1)

## 2017-01-07 LAB — CBC
HCT: 27.2 % — ABNORMAL LOW (ref 39.0–52.0)
Hemoglobin: 9.1 g/dL — ABNORMAL LOW (ref 13.0–17.0)
MCH: 30.4 pg (ref 26.0–34.0)
MCHC: 33.5 g/dL (ref 30.0–36.0)
MCV: 91 fL (ref 78.0–100.0)
PLATELETS: 127 10*3/uL — AB (ref 150–400)
RBC: 2.99 MIL/uL — ABNORMAL LOW (ref 4.22–5.81)
RDW: 14.5 % (ref 11.5–15.5)
WBC: 8.4 10*3/uL (ref 4.0–10.5)

## 2017-01-07 MED ORDER — DIPHENHYDRAMINE HCL 25 MG PO CAPS
25.0000 mg | ORAL_CAPSULE | Freq: Once | ORAL | Status: AC | PRN
  Administered 2017-01-07: 25 mg via ORAL
  Filled 2017-01-07: qty 1

## 2017-01-07 MED ORDER — HALOPERIDOL LACTATE 5 MG/ML IJ SOLN
2.0000 mg | Freq: Once | INTRAMUSCULAR | Status: AC
  Administered 2017-01-07: 2 mg via INTRAVENOUS
  Filled 2017-01-07: qty 1

## 2017-01-07 NOTE — Progress Notes (Signed)
PROGRESS NOTE [facility]   Omero Gobble Lininger   [id number] DOB: 1935/01/02  DOA: 01/06/2017 PCP: Geoffery Lyons, MD   Brief Narrative:  Christian Wiley is a 82 y.o. male with medical history significant of HTN well controlled, multiples PE, BPH, Chronic back pain, Hypothyroidism and Parkinsonism, presented to the ED with alter mental status, fever and decrease urine output. Patient had a recent lithotripsy with basket capture on 12/24/16. Patient was left with a foley cath after the procedure, he was prescribe a course of cipro which he completed. Over the past 2 weeks the patient has had 3 catheter changes, 2 time by urology and once in the ED 5 day PTA.   Wife report that 4 PTA patient has been more confuses and 2 days PTA patient started developing fever, with some changes in urine color - mild blood tinge was noted. Patient have no complaints at this time. Denies chest pain, SOB, dysuria, and abdominal pain.  Patient admitted for sepsis started on IVF and IV abx. Urology consulted.   Subjective: Patient seen and examined, doing well have no complaints.   Assessment & Plan: Urosepsis 2/2 foley catheter - meeting sepsis criteria. Lactic acid elevated, hypotensive, UA positive   Sepsis physiology has resolved  Sepsis protocol - Given Vanc and cefepime in ED switched to [place] for now  Blood culture no growth UTD Urine cultures re incubated  Tylenol PRN fever  Per urology will keep foley in place until patient more stable  Check am labs   AKI - prerenal 2/2 infectious process and dehydration, baseline Cr 0.90 Improving with IVF  Monitor Cr  Avoid nephrotoxic agents   Mild elevated troponin - Initial EKG with flattening of ST wave, patient asymptomatic  Likely demand ischemia from sepsis  Repeat EKG  TNI's trending down  Nephrolithiasis s/p left ureteroscopic stone extraction with laser 12/24/16 Urology recommendations appreciated  Per Urology     Hx PE Continue Xarelto  HTN - initially hypotensive, BP improving  Responded well to IVF  Holding BP medications - if BP remains stable will resume BP med in AM   BPH  Continue Flomax   Chronic back pain  Tylenol PRN  Can use ultram if needed  Parkinsonism  Continue Sinemet   Hypothyroidism  Continue Synthroid  DVT prophylaxis: Xarelto Code Status: FULL Family Communication: None at bedside  Disposition Plan: Home when medically stable   Consultants:   Urology   Procedures:   None   Antimicrobials:  Vanco 01/05/17 OTO   Cefepime 01/05/17 OTO  Merrem 01/05/17 -   Objective: Vitals:   01/06/17 2043 01/07/17 0446 01/07/17 0830 01/07/17 1350  BP: (!) 117/52 (!) 123/53 (!) 141/61 (!) 150/63  Pulse: (!) 52 64 67 67  Resp: 19 18 16 18   Temp: 97.5 F (36.4 C) 98.4 F (36.9 C) 97.7 F (36.5 C) 98 F (36.7 C)  TempSrc: Oral Oral Oral Oral  SpO2: 98% 93% 97% 95%  Weight:   104.8 kg (231 lb 1.6 oz)   Height:   6' (1.829 m)     Intake/Output Summary (Last 24 hours) at 01/07/17 1607 Last data filed at 01/07/17 1300  Gross per 24 hour  Intake          2101.67 ml  Output             1800 ml  Net           301.67 ml   Filed Weights   01/06/17  1159 01/06/17 1806 01/07/17 0830  Weight: 102.5 kg (226 lb) 105.2 kg (231 lb 14.8 oz) 104.8 kg (231 lb 1.6 oz)    Examination:  General exam: Appears calm and comfortable  HEENT: AC/AT, PERRLA, OP moist and clear Respiratory system: Clear to auscultation. No wheezes,crackle or rhonchi Cardiovascular system: S1 & S2 heard, RRR. No JVD, murmurs, rubs or gallops Gastrointestinal system: Abdomen is nondistended, soft and nontender. No organomegaly or masses felt. Normal bowel sounds heard. GI: Foley cath in place urine clear  Central nervous system: Alert and oriented. No focal neurological deficits. Extremities: No pedal edema. Skin: No rashes, lesions or ulcers  Data Reviewed: I have personally reviewed  following labs and imaging studies  CBC:  Recent Labs Lab 01/01/17 0454 01/06/17 1208 01/07/17 0234  WBC  --  9.5 8.4  NEUTROABS  --  7.9*  --   HGB 11.2* 10.3* 9.1*  HCT 33.0* 31.2* 27.2*  MCV  --  88.6 91.0  PLT  --  150 [id number]*   Basic Metabolic Panel:  Recent Labs Lab 01/01/17 0454 01/06/17 1208 01/07/17 0234  NA 134* 138 140  K 3.9 4.0 3.6  CL 96* 101 108  CO2  --  25 26  GLUCOSE 142* 231* 115*  BUN 19 20 19   CREATININE 1.30* 1.33* 1.18  CALCIUM  --  8.5* 7.8*   GFR: Estimated Creatinine Clearance: 62.5 mL/min (by C-G formula based on SCr of 1.18 mg/dL). Liver Function Tests:  Recent Labs Lab 01/06/17 1208 01/07/17 0234  AST 40 54*  ALT 12* <5*  ALKPHOS 82 70  BILITOT 1.4* 0.7  PROT 6.0* 5.5*  ALBUMIN 3.1* 2.6*   No results for input(s): LIPASE, AMYLASE in the last 168 hours. No results for input(s): AMMONIA in the last 168 hours. Coagulation Profile:  Recent Labs Lab 01/06/17 1208  INR 2.10   Cardiac Enzymes:  Recent Labs Lab 01/07/17 0234 01/07/17 0809 01/07/17 1409  TROPONINI 0.30* 0.27* 0.23*   BNP (last 3 results) No results for input(s): PROBNP in the last 8760 hours. HbA1C: No results for input(s): HGBA1C in the last 72 hours. CBG: No results for input(s): GLUCAP in the last 168 hours. Lipid Profile: No results for input(s): CHOL, HDL, LDLCALC, TRIG, CHOLHDL, LDLDIRECT in the last 72 hours. Thyroid Function Tests: No results for input(s): TSH, T4TOTAL, FREET4, T3FREE, THYROIDAB in the last 72 hours. Anemia Panel: No results for input(s): VITAMINB12, FOLATE, FERRITIN, TIBC, IRON, RETICCTPCT in the last 72 hours. Sepsis Labs:  Recent Labs Lab 01/06/17 1221 01/06/17 1849  LATICACIDVEN 3.07* 1.4    Recent Results (from the past 240 hour(s))  Urine culture     Status: None (Preliminary result)   Collection Time: 01/06/17 12:00 PM  Result Value Ref Range Status   Specimen Description URINE, CLEAN CATCH  Final   Special  Requests NONE  Final   Culture   Final    CULTURE REINCUBATED FOR BETTER GROWTH Performed at [facility], [place] [address], [place], [place] [address]    Report Status PENDING  Incomplete  Culture, blood (Routine x 2)     Status: None (Preliminary result)   Collection Time: 01/06/17 12:09 PM  Result Value Ref Range Status   Specimen Description BLOOD LEFT HAND  Final   Special Requests BOTTLES DRAWN AEROBIC AND ANAEROBIC 5ML  Final   Culture   Final    NO GROWTH 1 DAY Performed at [facility], [place] [address], [place], [place]  [address]    Report Status PENDING  Incomplete  Culture, blood (Routine x 2)     Status: None (Preliminary result)   Collection Time: 01/06/17 12:45 PM  Result Value Ref Range Status   Specimen Description BLOOD RIGHT ANTECUBITAL  Final   Special Requests BOTTLES DRAWN AEROBIC AND ANAEROBIC 5 CC EA  Final   Culture   Final    NO GROWTH 1 DAY Performed at [facility], [place] [address], [place], [place] [address]    Report Status PENDING  Incomplete      Radiology Studies: Dg Chest 2 View  Result Date: 01/06/2017 CLINICAL DATA:  Altered mental status, fever to 101.2 degrees, cloudy urine. History of Parkinson's disease. EXAM: CHEST  2 VIEW COMPARISON:  Chest x-ray of July 08, 2014 FINDINGS: The lungs are adequately inflated. There is hazy increased density at the left lung base. The heart is top-normal in size. The central pulmonary vascularity is prominent with mild cephalization. There is calcification in the wall of the aortic arch. There may be a trace left pleural effusion. The observed bony thorax is unremarkable. IMPRESSION: Patchy increased density in the left lower lobe consistent with atelectasis or early pneumonia. Possible trace pleural effusion. Low-grade compensated CHF. Followup PA and lateral chest X-ray is recommended in 3-4 weeks following trial of antibiotic therapy to ensure resolution. Electronically Signed   By: David  Martinique  M.D.   On: 01/06/2017 13:22   Ct Abdomen Pelvis W Contrast  Result Date: 01/06/2017 CLINICAL DATA:  82 y/o M; kidney stones removed 12/24/2016 followed by catheter removal, hematuria, and replacement of catheter. Fever today. EXAM: CT ABDOMEN AND PELVIS WITH CONTRAST TECHNIQUE: Multidetector CT imaging of the abdomen and pelvis was performed using the standard protocol following bolus administration of intravenous contrast. CONTRAST:  137mL ISOVUE-300 IOPAMIDOL (ISOVUE-300) INJECTION 61% COMPARISON:  11/18/2016 CT of the abdomen and pelvis. FINDINGS: Lower chest: No acute abnormality. Hepatobiliary: Status post cholecystectomy. Mild intrahepatic biliary ductal dilatation is increased from the prior study no obstructing stone or lesion identified. The common bile duct measures up to 10 mm and is stable. No focal liver lesion. Pancreas: Unremarkable. No pancreatic ductal dilatation or surrounding inflammatory changes. Spleen: Normal in size without focal abnormality. Adrenals/Urinary Tract: Normal adrenal glands. Symmetric enhancement of the kidneys. Multiple nonobstructing kidney stones bilaterally. Stable nonobstructing stone within the right renal pelvis. No right-sided hydronephrosis. Mild left hydronephrosis new from the prior study with mild urothelial enhancement and without obstructing stone. The bladder is collapsed around Foley catheter. Stomach/Bowel: Stomach is within normal limits. Appendix appears normal. No evidence of bowel wall thickening, distention, or inflammatory changes. Extensive sigmoid diverticulosis without evidence for diverticulitis. Vascular/Lymphatic: Stable infrarenal abdominal aortic aneurysm measuring up to 4.9 cm. Aortic and bilateral iliac stents in situ. Aortic atherosclerosis. Reproductive: Stable prostate enlargement. Other: No ascites. Musculoskeletal: Severe lumbar spondylosis post L2 through L4 interbody fusion and L3 through L5 laminectomy. Bilateral hip osteoarthrosis.  IMPRESSION: 1. Mild left-sided hydronephrosis with urothelial enhancement and without obstructing stone may represent a collecting system infection. Symmetric enhancement of the kidneys. No rim enhancing fluid collection identified. 2. Multiple nonobstructing kidney stones bilaterally including stable stone in the right renal pelvis. 3. Mild intrahepatic biliary ductal dilatation, slightly increased from prior study, may be compensatory post cholecystectomy, correlation with liver enzymes is recommended. 4. Sigmoid diverticulosis without evidence for diverticulitis. 5. Stable abdominal aortic aneurysm measuring up to 4.9 cm post aortic and bilateral iliac stenting. Electronically Signed   By: Mia Creek  [place]-Stratton M.D.   On: 01/06/2017 14:23    Scheduled Meds: . carbidopa-levodopa  1 tablet Oral TID  . escitalopram  10 mg Oral q morning - 10a  . levothyroxine  25 mcg Oral QAC breakfast  . meropenem (MERREM) IV  1 g Intravenous Q8H  . rivaroxaban  20 mg Oral Q supper  . tamsulosin  0.4 mg Oral QHS   Continuous Infusions:   LOS: 1 day    Chipper Oman, MD [facility] Pager [phone]  If 7PM-7AM, please contact night-coverage [email or web address] Password [email or web address] 01/07/2017, 4:07 PM

## 2017-01-07 NOTE — Progress Notes (Signed)
Subjective: Patient reports he feels tired today.   Objective: Vital signs in last 24 hours: Temp:  [97.5 F (36.4 C)-98.4 F (36.9 C)] 98 F (36.7 C) (01/30 1350) Pulse Rate:  [52-73] 67 (01/30 1350) Resp:  [16-26] 18 (01/30 1350) BP: (112-168)/(43-145) 150/63 (01/30 1350) SpO2:  [93 %-98 %] 95 % (01/30 1350) Weight:  [104.8 kg (231 lb 1.6 oz)-105.2 kg (231 lb 14.8 oz)] 104.8 kg (231 lb 1.6 oz) (01/30 0830)  Intake/Output from previous day: 01/29 0701 - 01/30 0700 In: 5211.7 [P.O.:120; I.V.:1141.7; IV Piggyback:3950] Out: 1000 [Urine:1000] Intake/Output this shift: Total I/O In: 240 [P.O.:240] Out: 800 [Urine:800]  Physical Exam:  NAD Alert Resp - reg effort and depth No CVAT Foley in place - urine clear.   Lab Results:  Recent Labs  01/06/17 1208 01/07/17 0234  HGB 10.3* 9.1*  HCT 31.2* 27.2*   BMET  Recent Labs  01/06/17 1208 01/07/17 0234  NA 138 140  K 4.0 3.6  CL 101 108  CO2 25 26  GLUCOSE 231* 115*  BUN 20 19  CREATININE 1.33* 1.18  CALCIUM 8.5* 7.8*    Recent Labs  01/06/17 1208  INR 2.10   No results for input(s): LABURIN in the last 72 hours. Results for orders placed or performed during the hospital encounter of 01/06/17  Urine culture     Status: None (Preliminary result)   Collection Time: 01/06/17 12:00 PM  Result Value Ref Range Status   Specimen Description URINE, CLEAN CATCH  Final   Special Requests NONE  Final   Culture   Final    CULTURE REINCUBATED FOR BETTER GROWTH Performed at [facility], [place] [address] [place], [place] [address]    Report Status PENDING  Incomplete    Studies/Results: Dg Chest 2 View  Result Date: 01/06/2017 CLINICAL DATA:  Altered mental status, fever to 101.2 degrees, cloudy urine. History of Parkinson's disease. EXAM: CHEST  2 VIEW COMPARISON:  Chest x-ray of July 08, 2014 FINDINGS: The lungs are adequately inflated. There is hazy increased density at the left lung base. The heart is  top-normal in size. The central pulmonary vascularity is prominent with mild cephalization. There is calcification in the wall of the aortic arch. There may be a trace left pleural effusion. The observed bony thorax is unremarkable. IMPRESSION: Patchy increased density in the left lower lobe consistent with atelectasis or early pneumonia. Possible trace pleural effusion. Low-grade compensated CHF. Followup PA and lateral chest X-ray is recommended in 3-4 weeks following trial of antibiotic therapy to ensure resolution. Electronically Signed   By: David  Martinique M.D.   On: 01/06/2017 13:22   Ct Abdomen Pelvis W Contrast  Result Date: 01/06/2017 CLINICAL DATA:  82 y/o M; kidney stones removed 12/24/2016 followed by catheter removal, hematuria, and replacement of catheter. Fever today. EXAM: CT ABDOMEN AND PELVIS WITH CONTRAST TECHNIQUE: Multidetector CT imaging of the abdomen and pelvis was performed using the standard protocol following bolus administration of intravenous contrast. CONTRAST:  164mL ISOVUE-300 IOPAMIDOL (ISOVUE-300) INJECTION 61% COMPARISON:  11/18/2016 CT of the abdomen and pelvis. FINDINGS: Lower chest: No acute abnormality. Hepatobiliary: Status post cholecystectomy. Mild intrahepatic biliary ductal dilatation is increased from the prior study no obstructing stone or lesion identified. The common bile duct measures up to 10 mm and is stable. No focal liver lesion. Pancreas: Unremarkable. No pancreatic ductal dilatation or surrounding inflammatory changes. Spleen: Normal in size without focal abnormality. Adrenals/Urinary Tract: Normal adrenal glands. Symmetric enhancement of the  kidneys. Multiple nonobstructing kidney stones bilaterally. Stable nonobstructing stone within the right renal pelvis. No right-sided hydronephrosis. Mild left hydronephrosis new from the prior study with mild urothelial enhancement and without obstructing stone. The bladder is collapsed around Foley catheter.  Stomach/Bowel: Stomach is within normal limits. Appendix appears normal. No evidence of bowel wall thickening, distention, or inflammatory changes. Extensive sigmoid diverticulosis without evidence for diverticulitis. Vascular/Lymphatic: Stable infrarenal abdominal aortic aneurysm measuring up to 4.9 cm. Aortic and bilateral iliac stents in situ. Aortic atherosclerosis. Reproductive: Stable prostate enlargement. Other: No ascites. Musculoskeletal: Severe lumbar spondylosis post L2 through L4 interbody fusion and L3 through L5 laminectomy. Bilateral hip osteoarthrosis. IMPRESSION: 1. Mild left-sided hydronephrosis with urothelial enhancement and without obstructing stone may represent a collecting system infection. Symmetric enhancement of the kidneys. No rim enhancing fluid collection identified. 2. Multiple nonobstructing kidney stones bilaterally including stable stone in the right renal pelvis. 3. Mild intrahepatic biliary ductal dilatation, slightly increased from prior study, may be compensatory post cholecystectomy, correlation with liver enzymes is recommended. 4. Sigmoid diverticulosis without evidence for diverticulitis. 5. Stable abdominal aortic aneurysm measuring up to 4.9 cm post aortic and bilateral iliac stenting. Electronically Signed   By: Kristine Garbe M.D.   On: 01/06/2017 14:23    Assessment/Plan:  fever - ? Source. Urine cx pending.   BPH, Urinary retention - continue tamsulosin. I'd like to remove foley/void trial once he's stable, cultures are known, etc.   Left hydro - expected after URS, stent and stent removal. Kindey fxn normal. Will follow.     LOS: 1 day   Rishav Rockefeller 01/07/2017, 2:19 PM

## 2017-01-07 NOTE — Evaluation (Signed)
Physical Therapy Evaluation Patient Details Name: Christian Wiley MRN: [id number] DOB: Apr 08, 1935 Today's Date: 01/07/2017   History of Present Illness  Christian Wiley is a 82 y.o. male with medical history significant of HTN well controlled, multiples PE, BPH, Chronic back pain, Hypothyroidism and Parkinsonism, presented to the ED with alter mental status, fever and decrease urine output  Clinical Impression  The patient required much encouragement by wife to mobilize to recliner. Notedd mild tremors of the arms after getting OOB. Pt admitted with above diagnosis. Pt currently with functional limitations due to the deficits listed below (see PT Problem List).  Pt will benefit from skilled PT to increase their independence and safety with mobility to allow discharge to the venue listed below.       Follow Up Recommendations Home health PT;Supervision/Assistance - 24 hour    Equipment Recommendations  None recommended by PT    Recommendations for Other Services       Precautions / Restrictions Precautions Precautions: Fall Precaution Comments: has catheter at home      Mobility  Bed Mobility Overal bed mobility: Needs Assistance Bed Mobility: Supine to Sit     Supine to sit: Min guard     General bed mobility comments: with encouragement to  sit at bed edge  Transfers Overall transfer level: Needs assistance Equipment used: Rolling walker (2 wheeled) Transfers: Sit to/from Stand Sit to Stand: Min assist Stand pivot transfers: Min assist       General transfer comment: steady assist upon standing. patient declined to ambulate. Only 5 ' to recliner.  Ambulation/Gait                Stairs            Wheelchair Mobility    Modified Rankin (Stroke Patients Only)       Balance Overall balance assessment: Needs assistance Sitting-balance support: Feet supported;No upper extremity supported Sitting balance-Leahy Scale: Fair     Standing balance  support: Bilateral upper extremity supported;During functional activity Standing balance-Leahy Scale: Poor                               Pertinent Vitals/Pain Pain Assessment: No/denies pain    Home Living Family/patient expects to be discharged to:: Private residence Living Arrangements: Spouse/significant other Available Help at Discharge: Family Type of Home: House Home Access: Level entry     Home Layout: One level Home Equipment: Environmental consultant - 2 wheels      Prior Function Level of Independence: Needs assistance   Gait / Transfers Assistance Needed: in house ambulates without assist.  Cane out side  ADL's / Homemaking Assistance Needed: wife assists        Hand Dominance        Extremity/Trunk Assessment   Upper Extremity Assessment Upper Extremity Assessment: Generalized weakness    Lower Extremity Assessment Lower Extremity Assessment: Generalized weakness       Communication      Cognition Arousal/Alertness: Awake/alert Behavior During Therapy: Flat affect Overall Cognitive Status: Impaired/Different from baseline Area of Impairment: Following commands Orientation Level: Place;Time;Situation     Following Commands: Follows one step commands with increased time            General Comments      Exercises     Assessment/Plan    PT Assessment Patient needs continued PT services  PT Problem List Decreased activity tolerance;Decreased balance;Decreased mobility;Decreased safety awareness;Decreased knowledge of  use of DME;Decreased cognition          PT Treatment Interventions DME instruction;Gait training;Functional mobility training;Therapeutic activities;Therapeutic exercise;Patient/family education;Cognitive remediation    PT Goals (Current goals can be found in the Care Plan section)  Acute Rehab PT Goals Patient Stated Goal: per wife, to walk by himself PT Goal Formulation: With family Time For Goal Achievement:  01/21/17 Potential to Achieve Goals: Good    Frequency Min 3X/week   Barriers to discharge        Co-evaluation               End of Session Equipment Utilized During Treatment: Gait belt Activity Tolerance: Patient tolerated treatment well Patient left: in chair;with call bell/phone within reach;with chair alarm set;with family/visitor present Nurse Communication: Mobility status         Time: [id number] PT Time Calculation (min) (ACUTE ONLY): 30 min   Charges:   PT Evaluation $PT Eval Low Complexity: 1 Procedure PT Treatments $Gait Training: 8-22 mins   PT G Codes:        Claretha Cooper 01/07/2017, 4:56 PM  Tresa Endo PT [phone]

## 2017-01-08 DIAGNOSIS — A498 Other bacterial infections of unspecified site: Secondary | ICD-10-CM

## 2017-01-08 DIAGNOSIS — B9620 Unspecified Escherichia coli [E. coli] as the cause of diseases classified elsewhere: Secondary | ICD-10-CM

## 2017-01-08 LAB — CBC WITH DIFFERENTIAL/PLATELET
BASOS ABS: 0 10*3/uL (ref 0.0–0.1)
Basophils Relative: 0 %
EOS PCT: 1 %
Eosinophils Absolute: 0.1 10*3/uL (ref 0.0–0.7)
HEMATOCRIT: 28 % — AB (ref 39.0–52.0)
HEMOGLOBIN: 9.5 g/dL — AB (ref 13.0–17.0)
LYMPHS ABS: 1.8 10*3/uL (ref 0.7–4.0)
LYMPHS PCT: 21 %
MCH: 30.4 pg (ref 26.0–34.0)
MCHC: 33.9 g/dL (ref 30.0–36.0)
MCV: 89.7 fL (ref 78.0–100.0)
Monocytes Absolute: 0.9 10*3/uL (ref 0.1–1.0)
Monocytes Relative: 10 %
NEUTROS ABS: 5.6 10*3/uL (ref 1.7–7.7)
NEUTROS PCT: 68 %
PLATELETS: 164 10*3/uL (ref 150–400)
RBC: 3.12 MIL/uL — AB (ref 4.22–5.81)
RDW: 14.6 % (ref 11.5–15.5)
WBC: 8.3 10*3/uL (ref 4.0–10.5)

## 2017-01-08 LAB — BASIC METABOLIC PANEL
Anion gap: 10 (ref 5–15)
BUN: 14 mg/dL (ref 6–20)
CHLORIDE: 103 mmol/L (ref 101–111)
CO2: 25 mmol/L (ref 22–32)
Calcium: 8.2 mg/dL — ABNORMAL LOW (ref 8.9–10.3)
Creatinine, Ser: 1.03 mg/dL (ref 0.61–1.24)
Glucose, Bld: 118 mg/dL — ABNORMAL HIGH (ref 65–99)
POTASSIUM: 3.1 mmol/L — AB (ref 3.5–5.1)
SODIUM: 138 mmol/L (ref 135–145)

## 2017-01-08 MED ORDER — SODIUM CHLORIDE 0.9 % IV SOLN: 30.0000 meq | Freq: Once | INTRAVENOUS | Status: DC

## 2017-01-08 MED ORDER — POTASSIUM CHLORIDE 10 MEQ/100ML IV SOLN
10.0000 meq | INTRAVENOUS | Status: DC
  Filled 2017-01-08 (×3): qty 100

## 2017-01-08 MED ORDER — HALOPERIDOL LACTATE 5 MG/ML IJ SOLN
2.0000 mg | Freq: Once | INTRAMUSCULAR | Status: AC
  Administered 2017-01-08: 2 mg via INTRAVENOUS
  Filled 2017-01-08: qty 1

## 2017-01-08 MED ORDER — POTASSIUM CHLORIDE CRYS ER 20 MEQ PO TBCR
40.0000 meq | EXTENDED_RELEASE_TABLET | ORAL | Status: AC
  Administered 2017-01-08 (×3): 40 meq via ORAL
  Filled 2017-01-08 (×3): qty 2

## 2017-01-08 NOTE — Progress Notes (Signed)
Spoke with pt concerning HHPT at discharge pt declined at present time.

## 2017-01-08 NOTE — Progress Notes (Addendum)
Subjective: Patient reports he want to get out of this bed.   Objective: Vital signs in last 24 hours: Temp:  [97.7 F (36.5 C)-98.5 F (36.9 C)] 98.5 F (36.9 C) (01/31 0431) Pulse Rate:  [60-68] 60 (01/31 0431) Resp:  [16-18] 18 (01/31 0431) BP: (141-171)/(61-63) 171/63 (01/31 0431) SpO2:  [95 %-97 %] 96 % (01/31 0431) Weight:  [104.8 kg (231 lb 1.6 oz)] 104.8 kg (231 lb 1.6 oz) (01/30 0830)  Intake/Output from previous day: 01/30 0701 - 01/31 0700 In: 960 [P.O.:360; I.V.:300; IV Piggyback:300] Out: 2100 [Urine:2100] Intake/Output this shift: No intake/output data recorded.  Physical Exam:  Alert to name, date - a little confused on location NAD Urine clear   Lab Results:  Recent Labs  01/06/17 1208 01/07/17 0234 01/08/17 0510  HGB 10.3* 9.1* 9.5*  HCT 31.2* 27.2* 28.0*   BMET  Recent Labs  01/07/17 0234 01/08/17 0510  NA 140 138  K 3.6 3.1*  CL 108 103  CO2 26 25  GLUCOSE 115* 118*  BUN 19 14  CREATININE 1.18 1.03  CALCIUM 7.8* 8.2*    Recent Labs  01/06/17 1208  INR 2.10   No results for input(s): LABURIN in the last 72 hours. Results for orders placed or performed during the hospital encounter of 01/06/17  Urine culture     Status: None (Preliminary result)   Collection Time: 01/06/17 12:00 PM  Result Value Ref Range Status   Specimen Description URINE, CLEAN CATCH  Final   Special Requests NONE  Final   Culture   Final    CULTURE REINCUBATED FOR BETTER GROWTH Performed at [facility], [place] [address], [place], [place] [address]    Report Status PENDING  Incomplete  Culture, blood (Routine x 2)     Status: None (Preliminary result)   Collection Time: 01/06/17 12:09 PM  Result Value Ref Range Status   Specimen Description BLOOD LEFT HAND  Final   Special Requests BOTTLES DRAWN AEROBIC AND ANAEROBIC 5ML  Final   Culture   Final    NO GROWTH 1 DAY Performed at [facility], [place] [address], [place], [place] [address]    Report Status PENDING  Incomplete  Culture, blood (Routine x 2)     Status: None (Preliminary result)   Collection Time: 01/06/17 12:45 PM  Result Value Ref Range Status   Specimen Description BLOOD RIGHT ANTECUBITAL  Final   Special Requests BOTTLES DRAWN AEROBIC AND ANAEROBIC 5 CC EA  Final   Culture   Final    NO GROWTH 1 DAY Performed at [facility], [place] [address], [place], [place] [address]    Report Status PENDING  Incomplete    Studies/Results: Dg Chest 2 View  Result Date: 01/06/2017 CLINICAL DATA:  Altered mental status, fever to 101.2 degrees, cloudy urine. History of Parkinson's disease. EXAM: CHEST  2 VIEW COMPARISON:  Chest x-ray of July 08, 2014 FINDINGS: The lungs are adequately inflated. There is hazy increased density at the left lung base. The heart is top-normal in size. The central pulmonary vascularity is prominent with mild cephalization. There is calcification in the wall of the aortic arch. There may be a trace left pleural effusion. The observed bony thorax is unremarkable. IMPRESSION: Patchy increased density in the left lower lobe consistent with atelectasis or early pneumonia. Possible trace pleural effusion. Low-grade compensated CHF. Followup PA and lateral chest X-ray is recommended in 3-4 weeks following trial of antibiotic therapy to ensure  resolution. Electronically Signed   By: David  Martinique M.D.   On: 01/06/2017 13:22   Ct Abdomen Pelvis W Contrast  Result Date: 01/06/2017 CLINICAL DATA:  82 y/o M; kidney stones removed 12/24/2016 followed by catheter removal, hematuria, and replacement of catheter. Fever today. EXAM: CT ABDOMEN AND PELVIS WITH CONTRAST TECHNIQUE: Multidetector CT imaging of the abdomen and pelvis was performed using the standard protocol following bolus administration of intravenous contrast. CONTRAST:  151mL ISOVUE-300 IOPAMIDOL (ISOVUE-300) INJECTION 61% COMPARISON:  11/18/2016 CT of the abdomen and pelvis. FINDINGS: Lower chest: No  acute abnormality. Hepatobiliary: Status post cholecystectomy. Mild intrahepatic biliary ductal dilatation is increased from the prior study no obstructing stone or lesion identified. The common bile duct measures up to 10 mm and is stable. No focal liver lesion. Pancreas: Unremarkable. No pancreatic ductal dilatation or surrounding inflammatory changes. Spleen: Normal in size without focal abnormality. Adrenals/Urinary Tract: Normal adrenal glands. Symmetric enhancement of the kidneys. Multiple nonobstructing kidney stones bilaterally. Stable nonobstructing stone within the right renal pelvis. No right-sided hydronephrosis. Mild left hydronephrosis new from the prior study with mild urothelial enhancement and without obstructing stone. The bladder is collapsed around Foley catheter. Stomach/Bowel: Stomach is within normal limits. Appendix appears normal. No evidence of bowel wall thickening, distention, or inflammatory changes. Extensive sigmoid diverticulosis without evidence for diverticulitis. Vascular/Lymphatic: Stable infrarenal abdominal aortic aneurysm measuring up to 4.9 cm. Aortic and bilateral iliac stents in situ. Aortic atherosclerosis. Reproductive: Stable prostate enlargement. Other: No ascites. Musculoskeletal: Severe lumbar spondylosis post L2 through L4 interbody fusion and L3 through L5 laminectomy. Bilateral hip osteoarthrosis. IMPRESSION: 1. Mild left-sided hydronephrosis with urothelial enhancement and without obstructing stone may represent a collecting system infection. Symmetric enhancement of the kidneys. No rim enhancing fluid collection identified. 2. Multiple nonobstructing kidney stones bilaterally including stable stone in the right renal pelvis. 3. Mild intrahepatic biliary ductal dilatation, slightly increased from prior study, may be compensatory post cholecystectomy, correlation with liver enzymes is recommended. 4. Sigmoid diverticulosis without evidence for diverticulitis. 5.  Stable abdominal aortic aneurysm measuring up to 4.9 cm post aortic and bilateral iliac stenting. Electronically Signed   By: Kristine Garbe M.D.   On: 01/06/2017 14:23    Assessment/Plan:  fever - from UTI - urine growing e coli and proteus.   BPH, Urinary retention - continue tamsulosin. D/c foley.  Void trial. Check bladder scan q shift, notify MD.   Left hydro - expected after URS, stent and stent removal. Kindey fxn improved. Will follow.      LOS: 2 days   Christian Wiley 01/08/2017, 7:14 AM

## 2017-01-08 NOTE — Progress Notes (Addendum)
PROGRESS NOTE [facility]   Christian Wiley   [id number] DOB: Oct 25, 1935  DOA: 01/06/2017 PCP: Geoffery Lyons, MD   Brief Narrative:  Christian Wiley is a 82 y.o. male with medical history significant of HTN well controlled, multiples PE, BPH, Chronic back pain, Hypothyroidism and Parkinsonism, presented to the ED with alter mental status, fever and decrease urine output. Patient had a recent lithotripsy with basket capture on 12/24/16. Patient was left with a foley cath after the procedure, he was prescribe a course of cipro which he completed. Over the past 2 weeks the patient has had 3 catheter changes, 2 time by urology and once in the ED 5 day PTA.   Wife report that 4 PTA patient has been more confuses and 2 days PTA patient started developing fever, with some changes in urine color - mild blood tinge was noted. Patient have no complaints at this time. Denies chest pain, SOB, dysuria, and abdominal pain.  Patient admitted for sepsis started on IVF and IV abx. Urology consulted.   Subjective: Patient seen and examined with rounding team at bedside. Patient have no new complaints. Foley was removed per urology.   Assessment & Plan: Urosepsis 2/2 foley catheter - meeting sepsis criteria. Lactic acid elevated, hypotensive, UA positive   Sepsis physiology has resolved  Initially treated with Vanc and cefepime in ED switched to [employer] while sensitivities pending  Blood culture no growth UTD Urine cultures growing Proteus and E coli - awaiting sensitivities  Tylenol PRN fever  Foley removed - voiding trials, bladder scan if no UOP   AKI - prerenal 2/2 infectious process and dehydration, baseline Cr 0.90 Resolved with IVF  Monitor Cr  Avoid nephrotoxic agents  Hypokalemia  Replete  Check BMP in AM    Nephrolithiasis s/p left ureteroscopic stone extraction with laser 12/24/16 Urology recommendations appreciated  Per Urology   BPH  Foley removed  - monitor UOP - bladder scan q shift  Continue Flomax   HTN - slight elevated  Patient was initially hypotensive due to sepsis, BP med were held  Resume home antihypertensives - Bystolic   Mild elevated troponin - Initial EKG with flattening of ST wave, patient asymptomatic  Likely demand ischemia from sepsis  Repeat EKG  TNI's trending down - no further cardiac evaluation at this time - follow up as outpatient    Hx PE Continue Xarelto  Chronic back pain  Tylenol PRN  Can use ultram if needed  Parkinsonism  Continue Sinemet   Hypothyroidism  Continue Synthroid  DVT prophylaxis: Xarelto Code Status: FULL Family Communication: None at bedside  Disposition Plan: Home when medically stable hopefully 24-48 hrs   Consultants:   Urology   Procedures:   None   Antimicrobials:  Vanco 01/05/17 OTO   Cefepime 01/05/17 OTO  Merrem 01/05/17 -   Objective: Vitals:   01/07/17 0830 01/07/17 1350 01/07/17 2126 01/08/17 0431  BP: (!) 141/61 (!) 150/63 (!) 165/61 (!) 171/63  Pulse: 67 67 68 60  Resp: 16 18 18 18   Temp: 97.7 F (36.5 C) 98 F (36.7 C) 97.8 F (36.6 C) 98.5 F (36.9 C)  TempSrc: Oral Oral Oral Oral  SpO2: 97% 95% 97% 96%  Weight: 104.8 kg (231 lb 1.6 oz)     Height: 6' (1.829 m)       Intake/Output Summary (Last 24 hours) at 01/08/17 1218 Last data filed at 01/08/17 0900  Gross per 24 hour  Intake  440 ml  Output             2600 ml  Net            -2160 ml   Filed Weights   01/06/17 1159 01/06/17 1806 01/07/17 0830  Weight: 102.5 kg (226 lb) 105.2 kg (231 lb 14.8 oz) 104.8 kg (231 lb 1.6 oz)    Examination:  General exam: Calm and comfortable, appears slight confused this morning  Respiratory system: CTA no wheezing or crackles  Cardiovascular system: S1S2 no murmurs  Gastrointestinal system: Soft NT ND Central nervous system: Alert to person only  Extremities: No LE edema  Skin: No rashes  Data Reviewed: I have  personally reviewed following labs and imaging studies  CBC:  Recent Labs Lab 01/06/17 1208 01/07/17 0234 01/08/17 0510  WBC 9.5 8.4 8.3  NEUTROABS 7.9*  --  5.6  HGB 10.3* 9.1* 9.5*  HCT 31.2* 27.2* 28.0*  MCV 88.6 91.0 89.7  PLT 150 127* [id number]   Basic Metabolic Panel:  Recent Labs Lab 01/06/17 1208 01/07/17 0234 01/08/17 0510  NA 138 140 138  K 4.0 3.6 3.1*  CL 101 108 103  CO2 25 26 25   GLUCOSE 231* 115* 118*  BUN 20 19 14   CREATININE 1.33* 1.18 1.03  CALCIUM 8.5* 7.8* 8.2*   GFR: Estimated Creatinine Clearance: 71.6 mL/min (by C-G formula based on SCr of 1.03 mg/dL). Liver Function Tests:  Recent Labs Lab 01/06/17 1208 01/07/17 0234  AST 40 54*  ALT 12* <5*  ALKPHOS 82 70  BILITOT 1.4* 0.7  PROT 6.0* 5.5*  ALBUMIN 3.1* 2.6*   No results for input(s): LIPASE, AMYLASE in the last 168 hours. No results for input(s): AMMONIA in the last 168 hours. Coagulation Profile:  Recent Labs Lab 01/06/17 1208  INR 2.10   Cardiac Enzymes:  Recent Labs Lab 01/07/17 0234 01/07/17 0809 01/07/17 1409  TROPONINI 0.30* 0.27* 0.23*   BNP (last 3 results) No results for input(s): PROBNP in the last 8760 hours. HbA1C: No results for input(s): HGBA1C in the last 72 hours. CBG: No results for input(s): GLUCAP in the last 168 hours. Lipid Profile: No results for input(s): CHOL, HDL, LDLCALC, TRIG, CHOLHDL, LDLDIRECT in the last 72 hours. Thyroid Function Tests: No results for input(s): TSH, T4TOTAL, FREET4, T3FREE, THYROIDAB in the last 72 hours. Anemia Panel: No results for input(s): VITAMINB12, FOLATE, FERRITIN, TIBC, IRON, RETICCTPCT in the last 72 hours. Sepsis Labs:  Recent Labs Lab 01/06/17 1221 01/06/17 1849  LATICACIDVEN 3.07* 1.4    Recent Results (from the past 240 hour(s))  Urine culture     Status: Abnormal (Preliminary result)   Collection Time: 01/06/17 12:00 PM  Result Value Ref Range Status   Specimen Description URINE, CLEAN CATCH   Final   Special Requests NONE  Final   Culture (A)  Final    >=100,000 COLONIES/mL PROTEUS MIRABILIS >=100,000 COLONIES/mL ESCHERICHIA COLI SUSCEPTIBILITIES TO FOLLOW Performed at [facility], [place] [address], [place], [place] [address]    Report Status PENDING  Incomplete  Culture, blood (Routine x 2)     Status: None (Preliminary result)   Collection Time: 01/06/17 12:09 PM  Result Value Ref Range Status   Specimen Description BLOOD LEFT HAND  Final   Special Requests BOTTLES DRAWN AEROBIC AND ANAEROBIC 5ML  Final   Culture   Final    NO GROWTH 1 DAY Performed at [facility], [place] [address], [place], [place] [address]  Report Status PENDING  Incomplete  Culture, blood (Routine x 2)     Status: None (Preliminary result)   Collection Time: 01/06/17 12:45 PM  Result Value Ref Range Status   Specimen Description BLOOD RIGHT ANTECUBITAL  Final   Special Requests BOTTLES DRAWN AEROBIC AND ANAEROBIC 5 CC EA  Final   Culture   Final    NO GROWTH 1 DAY Performed at [facility], [address], [place],  [address]    Report Status PENDING  Incomplete      Radiology Studies: Dg Chest 2 View  Result Date: 01/06/2017 CLINICAL DATA:  Altered mental status, fever to 101.2 degrees, cloudy urine. History of Parkinson's disease. EXAM: CHEST  2 VIEW COMPARISON:  Chest x-ray of July 08, 2014 FINDINGS: The lungs are adequately inflated. There is hazy increased density at the left lung base. The heart is top-normal in size. The central pulmonary vascularity is prominent with mild cephalization. There is calcification in the wall of the aortic arch. There may be a trace left pleural effusion. The observed bony thorax is unremarkable. IMPRESSION: Patchy increased density in the left lower lobe consistent with atelectasis or early pneumonia. Possible trace pleural effusion. Low-grade compensated CHF. Followup PA and lateral chest X-ray is recommended in 3-4 weeks following  trial of antibiotic therapy to ensure resolution. Electronically Signed   By: David  Martinique M.D.   On: 01/06/2017 13:22   Ct Abdomen Pelvis W Contrast  Result Date: 01/06/2017 CLINICAL DATA:  82 y/o M; kidney stones removed 12/24/2016 followed by catheter removal, hematuria, and replacement of catheter. Fever today. EXAM: CT ABDOMEN AND PELVIS WITH CONTRAST TECHNIQUE: Multidetector CT imaging of the abdomen and pelvis was performed using the standard protocol following bolus administration of intravenous contrast. CONTRAST:  189mL ISOVUE-300 IOPAMIDOL (ISOVUE-300) INJECTION 61% COMPARISON:  11/18/2016 CT of the abdomen and pelvis. FINDINGS: Lower chest: No acute abnormality. Hepatobiliary: Status post cholecystectomy. Mild intrahepatic biliary ductal dilatation is increased from the prior study no obstructing stone or lesion identified. The common bile duct measures up to 10 mm and is stable. No focal liver lesion. Pancreas: Unremarkable. No pancreatic ductal dilatation or surrounding inflammatory changes. Spleen: Normal in size without focal abnormality. Adrenals/Urinary Tract: Normal adrenal glands. Symmetric enhancement of the kidneys. Multiple nonobstructing kidney stones bilaterally. Stable nonobstructing stone within the right renal pelvis. No right-sided hydronephrosis. Mild left hydronephrosis new from the prior study with mild urothelial enhancement and without obstructing stone. The bladder is collapsed around Foley catheter. Stomach/Bowel: Stomach is within normal limits. Appendix appears normal. No evidence of bowel wall thickening, distention, or inflammatory changes. Extensive sigmoid diverticulosis without evidence for diverticulitis. Vascular/Lymphatic: Stable infrarenal abdominal aortic aneurysm measuring up to 4.9 cm. Aortic and bilateral iliac stents in situ. Aortic atherosclerosis. Reproductive: Stable prostate enlargement. Other: No ascites. Musculoskeletal: Severe lumbar spondylosis post L2  through L4 interbody fusion and L3 through L5 laminectomy. Bilateral hip osteoarthrosis. IMPRESSION: 1. Mild left-sided hydronephrosis with urothelial enhancement and without obstructing stone may represent a collecting system infection. Symmetric enhancement of the kidneys. No rim enhancing fluid collection identified. 2. Multiple nonobstructing kidney stones bilaterally including stable stone in the right renal pelvis. 3. Mild intrahepatic biliary ductal dilatation, slightly increased from prior study, may be compensatory post cholecystectomy, correlation with liver enzymes is recommended. 4. Sigmoid diverticulosis without evidence for diverticulitis. 5. Stable abdominal aortic aneurysm measuring up to 4.9 cm post aortic and bilateral iliac stenting. Electronically Signed   By: Edgardo Roys.D.  On: 01/06/2017 14:23    Scheduled Meds: . carbidopa-levodopa  1 tablet Oral TID  . escitalopram  10 mg Oral q morning - 10a  . levothyroxine  25 mcg Oral QAC breakfast  . meropenem (MERREM) IV  1 g Intravenous Q8H  . potassium chloride  40 mEq Oral Q2H  . rivaroxaban  20 mg Oral Q supper  . tamsulosin  0.4 mg Oral QHS   Continuous Infusions:   LOS: 2 days    Chipper Oman, MD [facility] Pager [phone]  If 7PM-7AM, please contact night-coverage [email or web address] Password [email or web address] 01/08/2017, 12:18 PM

## 2017-01-09 DIAGNOSIS — R7989 Other specified abnormal findings of blood chemistry: Secondary | ICD-10-CM

## 2017-01-09 DIAGNOSIS — N390 Urinary tract infection, site not specified: Secondary | ICD-10-CM

## 2017-01-09 DIAGNOSIS — A419 Sepsis, unspecified organism: Secondary | ICD-10-CM

## 2017-01-09 DIAGNOSIS — A4151 Sepsis due to Escherichia coli [E. coli]: Secondary | ICD-10-CM

## 2017-01-09 DIAGNOSIS — R778 Other specified abnormalities of plasma proteins: Secondary | ICD-10-CM | POA: Diagnosis present

## 2017-01-09 LAB — URINE CULTURE: Culture: >=100000 — AB

## 2017-01-09 LAB — BASIC METABOLIC PANEL
ANION GAP: 8 (ref 5–15)
BUN: 13 mg/dL (ref 6–20)
CALCIUM: 8.6 mg/dL — AB (ref 8.9–10.3)
CHLORIDE: 103 mmol/L (ref 101–111)
CO2: 27 mmol/L (ref 22–32)
CREATININE: 1.07 mg/dL (ref 0.61–1.24)
GFR calc non Af Amer: >60 mL/min (ref >60)
Glucose, Bld: 119 mg/dL — ABNORMAL HIGH (ref 65–99)
Potassium: 4.5 mmol/L (ref 3.5–5.1)
SODIUM: 138 mmol/L (ref 135–145)

## 2017-01-09 MED ORDER — HYDRALAZINE HCL 25 MG PO TABS: 25.0000 mg | ORAL_TABLET | Freq: Four times a day (QID) | ORAL | Status: DC | PRN

## 2017-01-09 MED ORDER — NEBIVOLOL HCL 10 MG PO TABS
10.0000 mg | ORAL_TABLET | Freq: Every morning | ORAL | Status: DC
  Administered 2017-01-09 – 2017-01-10 (×2): 10 mg via ORAL
  Filled 2017-01-09 (×2): qty 1

## 2017-01-09 MED ORDER — CIPROFLOXACIN HCL 500 MG PO TABS
500.0000 mg | ORAL_TABLET | Freq: Two times a day (BID) | ORAL | Status: DC
  Administered 2017-01-09: 500 mg via ORAL
  Filled 2017-01-09: qty 1

## 2017-01-09 MED ORDER — CIPROFLOXACIN HCL 500 MG PO TABS: 500.0000 mg | ORAL_TABLET | Freq: Two times a day (BID) | ORAL | 0 refills | Status: DC

## 2017-01-09 MED ORDER — ATORVASTATIN CALCIUM 40 MG PO TABS
40.0000 mg | ORAL_TABLET | Freq: Every day | ORAL | Status: DC
  Administered 2017-01-09: 40 mg via ORAL
  Filled 2017-01-09: qty 1

## 2017-01-09 MED ORDER — ALLOPURINOL 300 MG PO TABS
300.0000 mg | ORAL_TABLET | Freq: Every morning | ORAL | Status: DC
  Administered 2017-01-09 – 2017-01-10 (×2): 300 mg via ORAL
  Filled 2017-01-09 (×2): qty 1

## 2017-01-09 NOTE — Discharge Summary (Signed)
Physician Discharge Summary  Christian Wiley [id number] DOB: September 06, 1935 DOA: 01/06/2017  PCP: Geoffery Lyons, MD  Admit date: 01/06/2017 Discharge date: 01/10/2017  Time spent: 25 min* minutes  Recommendations for Outpatient Follow-up:  1. Follow up Urology in 2 weeks 2. Follow up cardiology in 2 weeks   Discharge Diagnoses:  Active Problems:   Sepsis ([place])   Sepsis secondary to UTI [facility])   Troponin level elevated   Discharge Condition: Stable  Diet recommendation: Low salt diet  Filed Weights   01/06/17 1159 01/06/17 1806 01/07/17 0830  Weight: 102.5 kg (226 lb) 105.2 kg (231 lb 14.8 oz) 104.8 kg (231 lb 1.6 oz)    History of present illness:  82 y.o.malewith medical history significant of HTN well controlled, multiples PE, BPH, Chronic back pain, Hypothyroidism and Parkinsonism, presented to the ED with alter mental status, fever and decrease urine output. Patient had a recent lithotripsy with basket capture on 12/24/16. Patient was left with a foley cath after the procedure, he was prescribe a course of cipro which he completed. Over the past 2 weeks the patient has had 3 catheter changes, 2 time by urology and once in the ED 5 day PTA.   Wife report that 4 PTA patient has been more confuses and 2 days PTA patient started developing fever, with some changes in urine color - mild blood tinge was noted. Patient have no complaints at this time. Denies chest pain, SOB, dysuria, and abdominal pain.  Patient admitted for sepsis started on IVF and IV abx. Urology consulted.   Hospital Course:   Urosepsis 2/2 foley catheter - meeting sepsis criteria. Lactic acid elevated, hypotensive, UA positive  Sepsis physiology has resolved  Initially treated with Vanc and cefepime in ED switched to Merrem Blood culture no growth UTD Urine cultures growing Proteus and E coli - sensitive to Bactrim Foley removed - voiding trials, bladder scan showed 150 ml urine Patient is  voiding without difficulty Will change the antibiotics to bactrim, as patient's wife concerned with hallucinations as side effect of Cipro  AKI - prerenal 2/2 infectious process and dehydration, baseline Cr 0.90 Resolved with IVF  Today creatinine is 1.07  Hypokalemia  Replete  Today potassium is 4.5   Nephrolithiasis s/p left ureteroscopic stone extraction with laser 12/24/16 Urology  To follow as outpatient   BPH  Foley removed -  Continue Flomax   HTN - slight elevated  Patient was initially hypotensive due to sepsis, BP med were held  Resume home antihypertensives - Bystolic  Will add Amlodipine 5 mg po daily  Mild elevated troponin - Initial EKG with flattening of ST wave, patient asymptomatic  Likely demand ischemia from sepsis  Cardiology was consulted, and echo was done which showed grade 2 diastolic dysfunction, EF [id number] Cardiology to arrange for outpatient Lexiscan myoview.   Hx PE Continue Xarelto  Chronic back pain  Tylenol PRN    Parkinsonism Continue Sinemet   Hypothyroidism  Continue Synthroid  Procedures Echocardiogram  Discharge Exam: Vitals:   01/10/17 0528 01/10/17 1325  BP: (!) 175/78 (!) 145/51  Pulse: 62 (!) 57  Resp: 18 18  Temp: 98.7 F (37.1 C) 98.4 F (36.9 C)    General: Appear in no acute distress Cardiovascular: RRR, S1 S2 Respiratory: Clear bilaterally  Discharge Instructions   Discharge Instructions    Diet - low sodium heart healthy    Complete by:  As directed    Increase activity slowly    Complete  by:  As directed      Current Discharge Medication List    START taking these medications   Details  amLODipine (NORVASC) 5 MG tablet Take 1 tablet (5 mg total) by mouth daily. Qty: 30 tablet, Refills: 2    sulfamethoxazole-trimethoprim (BACTRIM DS,SEPTRA DS) 800-160 MG tablet Take 1 tablet by mouth every 12 (twelve) hours. Qty: 20 tablet, Refills: 0      CONTINUE these medications which have NOT  CHANGED   Details  allopurinol (ZYLOPRIM) 300 MG tablet Take 300 mg by mouth every morning.     atorvastatin (LIPITOR) 40 MG tablet Take 1 tablet by mouth at bedtime.     carbidopa-levodopa (SINEMET IR) 25-100 MG tablet TAKE 1 TABLET BY MOUTH 3 (THREE) TIMES DAILY. Qty: 90 tablet, Refills: 5    escitalopram (LEXAPRO) 10 MG tablet Take 10 mg by mouth every morning.  Refills: 1    HYDROcodone-acetaminophen (NORCO/VICODIN) 5-325 MG tablet TAKE 2 TABLETs EVERY 4 HOURS AS NEEDED FOR PAIN Refills: 0    levothyroxine (SYNTHROID, LEVOTHROID) 25 MCG tablet Take 25 mcg by mouth daily before breakfast.  Refills: 6    nebivolol (BYSTOLIC) 10 MG tablet Take 10 mg by mouth every morning.    rivaroxaban (XARELTO) 20 MG TABS tablet Take 1 tablet (20 mg total) by mouth daily with supper. Qty: 30 tablet, Refills: 3    Sennosides (EX-LAX) 15 MG TABS Take 15 mg by mouth as needed (constipation).     tamsulosin (FLOMAX) 0.4 MG CAPS capsule Take 0.4 mg by mouth at bedtime.      STOP taking these medications     naproxen sodium (ANAPROX) 220 MG tablet        Allergies  Allergen Reactions  . Ambien [Zolpidem Tartrate]     Goes wild, gets anxious and confused with ambien   Follow-up Information    [name],[name], MD Follow up in 2 week(s).   Specialty:  Internal Medicine Contact information: [place] [address] [phone]        Festus Aloe, MD. Schedule an appointment as soon as possible for a visit in 2 week(s).   Specialty:  Urology Contact information: [place] [address] [phone]            The results of significant diagnostics from this hospitalization (including imaging, microbiology, ancillary and laboratory) are listed below for reference.    Significant Diagnostic Studies: Dg Chest 2 View  Result Date: 01/06/2017 CLINICAL DATA:  Altered mental status, fever to 101.2 degrees, cloudy urine. History of Parkinson's  disease. EXAM: CHEST  2 VIEW COMPARISON:  Chest x-ray of July 08, 2014 FINDINGS: The lungs are adequately inflated. There is hazy increased density at the left lung base. The heart is top-normal in size. The central pulmonary vascularity is prominent with mild cephalization. There is calcification in the wall of the aortic arch. There may be a trace left pleural effusion. The observed bony thorax is unremarkable. IMPRESSION: Patchy increased density in the left lower lobe consistent with atelectasis or early pneumonia. Possible trace pleural effusion. Low-grade compensated CHF. Followup PA and lateral chest X-ray is recommended in 3-4 weeks following trial of antibiotic therapy to ensure resolution. Electronically Signed   By: David  Martinique M.D.   On: 01/06/2017 13:22   Ct Abdomen Pelvis W Contrast  Result Date: 01/06/2017 CLINICAL DATA:  82 y/o M; kidney stones removed 12/24/2016 followed by catheter removal, hematuria, and replacement of catheter. Fever today. EXAM: CT  ABDOMEN AND PELVIS WITH CONTRAST TECHNIQUE: Multidetector CT imaging of the abdomen and pelvis was performed using the standard protocol following bolus administration of intravenous contrast. CONTRAST:  142mL ISOVUE-300 IOPAMIDOL (ISOVUE-300) INJECTION 61% COMPARISON:  11/18/2016 CT of the abdomen and pelvis. FINDINGS: Lower chest: No acute abnormality. Hepatobiliary: Status post cholecystectomy. Mild intrahepatic biliary ductal dilatation is increased from the prior study no obstructing stone or lesion identified. The common bile duct measures up to 10 mm and is stable. No focal liver lesion. Pancreas: Unremarkable. No pancreatic ductal dilatation or surrounding inflammatory changes. Spleen: Normal in size without focal abnormality. Adrenals/Urinary Tract: Normal adrenal glands. Symmetric enhancement of the kidneys. Multiple nonobstructing kidney stones bilaterally. Stable nonobstructing stone within the right renal pelvis. No right-sided  hydronephrosis. Mild left hydronephrosis new from the prior study with mild urothelial enhancement and without obstructing stone. The bladder is collapsed around Foley catheter. Stomach/Bowel: Stomach is within normal limits. Appendix appears normal. No evidence of bowel wall thickening, distention, or inflammatory changes. Extensive sigmoid diverticulosis without evidence for diverticulitis. Vascular/Lymphatic: Stable infrarenal abdominal aortic aneurysm measuring up to 4.9 cm. Aortic and bilateral iliac stents in situ. Aortic atherosclerosis. Reproductive: Stable prostate enlargement. Other: No ascites. Musculoskeletal: Severe lumbar spondylosis post L2 through L4 interbody fusion and L3 through L5 laminectomy. Bilateral hip osteoarthrosis. IMPRESSION: 1. Mild left-sided hydronephrosis with urothelial enhancement and without obstructing stone may represent a collecting system infection. Symmetric enhancement of the kidneys. No rim enhancing fluid collection identified. 2. Multiple nonobstructing kidney stones bilaterally including stable stone in the right renal pelvis. 3. Mild intrahepatic biliary ductal dilatation, slightly increased from prior study, may be compensatory post cholecystectomy, correlation with liver enzymes is recommended. 4. Sigmoid diverticulosis without evidence for diverticulitis. 5. Stable abdominal aortic aneurysm measuring up to 4.9 cm post aortic and bilateral iliac stenting. Electronically Signed   By: Kristine Garbe M.D.   On: 01/06/2017 14:23    Microbiology: Recent Results (from the past 240 hour(s))  Urine culture     Status: Abnormal   Collection Time: 01/06/17 12:00 PM  Result Value Ref Range Status   Specimen Description URINE, CLEAN CATCH  Final   Special Requests NONE  Final   Culture (A)  Final    >=100,000 COLONIES/mL PROTEUS MIRABILIS >=100,000 COLONIES/mL ESCHERICHIA COLI    Report Status 01/09/2017 FINAL  Final   Organism ID, Bacteria PROTEUS  MIRABILIS (A)  Final   Organism ID, Bacteria ESCHERICHIA COLI (A)  Final      Susceptibility   Escherichia coli - MIC*    AMPICILLIN <=2 SENSITIVE Sensitive     CEFAZOLIN <=4 SENSITIVE Sensitive     CEFTRIAXONE <=1 SENSITIVE Sensitive     CIPROFLOXACIN <=0.25 SENSITIVE Sensitive     GENTAMICIN <=1 SENSITIVE Sensitive     IMIPENEM <=0.25 SENSITIVE Sensitive     NITROFURANTOIN <=16 SENSITIVE Sensitive     TRIMETH/SULFA <=20 SENSITIVE Sensitive     AMPICILLIN/SULBACTAM <=2 SENSITIVE Sensitive     PIP/TAZO <=4 SENSITIVE Sensitive     Extended ESBL NEGATIVE Sensitive     * >=100,000 COLONIES/mL ESCHERICHIA COLI   Proteus mirabilis - MIC*    AMPICILLIN <=2 SENSITIVE Sensitive     CEFAZOLIN <=4 SENSITIVE Sensitive     CEFTRIAXONE <=1 SENSITIVE Sensitive     CIPROFLOXACIN <=0.25 SENSITIVE Sensitive     GENTAMICIN <=1 SENSITIVE Sensitive     IMIPENEM 1 SENSITIVE Sensitive     NITROFURANTOIN 128 RESISTANT Resistant     TRIMETH/SULFA <=20 SENSITIVE Sensitive  AMPICILLIN/SULBACTAM <=2 SENSITIVE Sensitive     PIP/TAZO <=4 SENSITIVE Sensitive     * >=100,000 COLONIES/mL PROTEUS MIRABILIS  Culture, blood (Routine x 2)     Status: None (Preliminary result)   Collection Time: 01/06/17 12:09 PM  Result Value Ref Range Status   Specimen Description BLOOD LEFT HAND  Final   Special Requests BOTTLES DRAWN AEROBIC AND ANAEROBIC 5ML  Final   Culture   Final    NO GROWTH 4 DAYS Performed at [facility], [place] [address], [place], [place] [address]    Report Status PENDING  Incomplete  Culture, blood (Routine x 2)     Status: None (Preliminary result)   Collection Time: 01/06/17 12:45 PM  Result Value Ref Range Status   Specimen Description BLOOD RIGHT ANTECUBITAL  Final   Special Requests BOTTLES DRAWN AEROBIC AND ANAEROBIC 5 CC EA  Final   Culture   Final    NO GROWTH 4 DAYS Performed at [facility], [place] [address] [place],  [address]    Report Status PENDING   Incomplete     Labs: Basic Metabolic Panel:  Recent Labs Lab 01/06/17 1208 01/07/17 0234 01/08/17 0510 01/09/17 0509  NA 138 140 138 138  K 4.0 3.6 3.1* 4.5  CL 101 108 103 103  CO2 25 26 25 27   GLUCOSE 231* 115* 118* 119*  BUN 20 19 14 13   CREATININE 1.33* 1.18 1.03 1.07  CALCIUM 8.5* 7.8* 8.2* 8.6*   Liver Function Tests:  Recent Labs Lab 01/06/17 1208 01/07/17 0234  AST 40 54*  ALT 12* <5*  ALKPHOS 82 70  BILITOT 1.4* 0.7  PROT 6.0* 5.5*  ALBUMIN 3.1* 2.6*   No results for input(s): LIPASE, AMYLASE in the last 168 hours. No results for input(s): AMMONIA in the last 168 hours. CBC:  Recent Labs Lab 01/06/17 1208 01/07/17 0234 01/08/17 0510  WBC 9.5 8.4 8.3  NEUTROABS 7.9*  --  5.6  HGB 10.3* 9.1* 9.5*  HCT 31.2* 27.2* 28.0*  MCV 88.6 91.0 89.7  PLT 150 127* 164   Cardiac Enzymes:  Recent Labs Lab 01/07/17 0234 01/07/17 0809 01/07/17 1409  TROPONINI 0.30* 0.27* 0.23*       Signed:  Eleonore Chiquito S MD.  [facility] 01/10/2017, 4:17 PM

## 2017-01-09 NOTE — Progress Notes (Addendum)
[facility]  PROGRESS NOTE  Christian Wiley Koman [id number] DOB: 07-Jan-1935 DOA: 01/06/2017 PCP: Geoffery Lyons, MD   Brief HPI:    82 y.o. male with medical history significant of HTN well controlled, multiples PE, BPH, Chronic back pain, Hypothyroidism and Parkinsonism, presented to the ED with alter mental status, fever and decrease urine output. Patient had a recent lithotripsy with basket capture on 12/24/16. Patient was left with a foley cath after the procedure, he was prescribe a course of cipro which he completed. Over the past 2 weeks the patient has had 3 catheter changes, 2 time by urology and once in the ED 5 day PTA.  Wife report that 4 PTA patient has been more confuses and 2 days PTA patient started developing fever, with some changes in urine color - mild blood tinge was noted. Patient have no complaints at this time. Denies chest pain, SOB, dysuria, and abdominal pain.    Subjective   Patient denies dysuria this morning, has been voiding without difficulty. Bladder scan this morning showed urine 1 50 mL.   Assessment/Plan:     1. Urosepsis- resolved, initially started on vancomycin and cefepime in the ED which was switched to [place]. Urine culture growing Proteus and Escherichia coli, sensitive to ciprofloxacin. Will change to by mouth Cipro. Foley catheter was removed, Patient now voiding. Bladder scan showed 1 50 mL urine. Patient can be discharged on ciprofloxacin 500 mg by mouth twice a day. 2. Elevated troponin/EKG changes- patient had elevated troponin 0.30 which is now coming down. EKG showed changes of T-wave inversions which were not present on previous day 01/06/2017. I called and discussed with cardiology Dr. Percival Spanish, who recommends to obtain echocardiogram. Cardiology will follow patient in a.m. 3. Acute kidney injury- baseline creatinine around 0.90, creatinine improved after IV fluids. Today creatinine 1.07 4. History of pulmonary embolism- continue  Xarelto 5. Parkinson disease- continue Sinemet  6. Hypothyroidism-continue Synthroid 7. Hypertension- blood pressure is now elevated, will restart Bystolic. Hydralazine 25 mg by mouth 6 hours when necessary for BP greater than 160/100.     DVT prophylaxis: Xarelto  Code Status: Full code  Family Communication: Discussed with patient's wife at bedside   Disposition Plan: Home likely after cardiology evaluation   Consultants:  Urology   Procedures:  None       Antibiotics:   Anti-infectives    Start     Dose/Rate Route Frequency Ordered Stop   01/09/17 0000  ciprofloxacin (CIPRO) 500 MG tablet     500 mg Oral 2 times daily 01/09/17 1403     01/06/17 2000  ceFEPIme (MAXIPIME) 1 g in dextrose 5 % 50 mL IVPB  Status:  Discontinued     1 g 100 mL/hr over 30 Minutes Intravenous Every 8 hours 01/06/17 1304 01/06/17 1827   01/06/17 2000  meropenem (MERREM) 1 g in sodium chloride 0.9 % 100 mL IVPB     1 g 200 mL/hr over 30 Minutes Intravenous Every 8 hours 01/06/17 1834     01/06/17 1800  vancomycin (VANCOCIN) 1,250 mg in sodium chloride 0.9 % 250 mL IVPB  Status:  Discontinued     1,250 mg 166.7 mL/hr over 90 Minutes Intravenous Every 24 hours 01/06/17 1415 01/06/17 1838   01/06/17 1415  vancomycin (VANCOCIN) IVPB 1000 mg/200 mL premix     1,000 mg 200 mL/hr over 60 Minutes Intravenous  Once 01/06/17 1404 01/06/17 1602   01/06/17 1230  ceFEPIme (MAXIPIME) 2 g in dextrose 5 % 50  mL IVPB     2 g 100 mL/hr over 30 Minutes Intravenous  Once 01/06/17 1218 01/06/17 1317       Objective   Vitals:   01/08/17 2053 01/08/17 2057 01/09/17 1349 01/09/17 1358  BP: (!) 184/69 (!) 176/82 (!) 188/82 (!) 168/78  Pulse: 96  64   Resp: 19  18   Temp: 98.7 F (37.1 C)  98.2 F (36.8 C)   TempSrc: Oral  Oral   SpO2: 95%  95%   Weight:      Height:        Intake/Output Summary (Last 24 hours) at 01/09/17 1609 Last data filed at 01/09/17 1505  Gross per 24 hour  Intake               300 ml  Output             2900 ml  Net            -2600 ml   Filed Weights   01/06/17 1159 01/06/17 1806 01/07/17 0830  Weight: 102.5 kg (226 lb) 105.2 kg (231 lb 14.8 oz) 104.8 kg (231 lb 1.6 oz)     Physical Examination:  General exam: Appears calm and comfortable. Respiratory system: Clear to auscultation. Respiratory effort normal. Cardiovascular system:  RRR. No  murmurs, rubs, gallops. No pedal edema. GI system: Abdomen is nondistended, soft and nontender. No organomegaly.  Central nervous system. No focal neurological deficits. 5 x 5 power in all extremities. Skin: No rashes, lesions or ulcers. Psychiatry: Alert, oriented x 3.Judgement and insight appear normal. Affect normal.    Data Reviewed: I have personally reviewed following labs and imaging studies  CBG: No results for input(s): GLUCAP in the last 168 hours.  CBC:  Recent Labs Lab 01/06/17 1208 01/07/17 0234 01/08/17 0510  WBC 9.5 8.4 8.3  NEUTROABS 7.9*  --  5.6  HGB 10.3* 9.1* 9.5*  HCT 31.2* 27.2* 28.0*  MCV 88.6 91.0 89.7  PLT 150 127* [id number]    Basic Metabolic Panel:  Recent Labs Lab 01/06/17 1208 01/07/17 0234 01/08/17 0510 01/09/17 0509  NA 138 140 138 138  K 4.0 3.6 3.1* 4.5  CL 101 108 103 103  CO2 25 26 25 27   GLUCOSE 231* 115* 118* 119*  BUN 20 19 14 13   CREATININE 1.33* 1.18 1.03 1.07  CALCIUM 8.5* 7.8* 8.2* 8.6*    Recent Results (from the past 240 hour(s))  Urine culture     Status: Abnormal   Collection Time: 01/06/17 12:00 PM  Result Value Ref Range Status   Specimen Description URINE, CLEAN CATCH  Final   Special Requests NONE  Final   Culture (A)  Final    >=100,000 COLONIES/mL PROTEUS MIRABILIS >=100,000 COLONIES/mL ESCHERICHIA COLI    Report Status 01/09/2017 FINAL  Final   Organism ID, Bacteria PROTEUS MIRABILIS (A)  Final   Organism ID, Bacteria ESCHERICHIA COLI (A)  Final      Susceptibility   Escherichia coli - MIC*    AMPICILLIN <=2 SENSITIVE  Sensitive     CEFAZOLIN <=4 SENSITIVE Sensitive     CEFTRIAXONE <=1 SENSITIVE Sensitive     CIPROFLOXACIN <=0.25 SENSITIVE Sensitive     GENTAMICIN <=1 SENSITIVE Sensitive     IMIPENEM <=0.25 SENSITIVE Sensitive     NITROFURANTOIN <=16 SENSITIVE Sensitive     TRIMETH/SULFA <=20 SENSITIVE Sensitive     AMPICILLIN/SULBACTAM <=2 SENSITIVE Sensitive     PIP/TAZO <=4 SENSITIVE Sensitive  Extended ESBL NEGATIVE Sensitive     * >=100,000 COLONIES/mL ESCHERICHIA COLI   Proteus mirabilis - MIC*    AMPICILLIN <=2 SENSITIVE Sensitive     CEFAZOLIN <=4 SENSITIVE Sensitive     CEFTRIAXONE <=1 SENSITIVE Sensitive     CIPROFLOXACIN <=0.25 SENSITIVE Sensitive     GENTAMICIN <=1 SENSITIVE Sensitive     IMIPENEM 1 SENSITIVE Sensitive     NITROFURANTOIN 128 RESISTANT Resistant     TRIMETH/SULFA <=20 SENSITIVE Sensitive     AMPICILLIN/SULBACTAM <=2 SENSITIVE Sensitive     PIP/TAZO <=4 SENSITIVE Sensitive     * >=100,000 COLONIES/mL PROTEUS MIRABILIS  Culture, blood (Routine x 2)     Status: None (Preliminary result)   Collection Time: 01/06/17 12:09 PM  Result Value Ref Range Status   Specimen Description BLOOD LEFT HAND  Final   Special Requests BOTTLES DRAWN AEROBIC AND ANAEROBIC 5ML  Final   Culture   Final    NO GROWTH 3 DAYS Performed at [facility], [address] [place], [place] [address]    Report Status PENDING  Incomplete  Culture, blood (Routine x 2)     Status: None (Preliminary result)   Collection Time: 01/06/17 12:45 PM  Result Value Ref Range Status   Specimen Description BLOOD RIGHT ANTECUBITAL  Final   Special Requests BOTTLES DRAWN AEROBIC AND ANAEROBIC 5 CC EA  Final   Culture   Final    NO GROWTH 3 DAYS Performed at [facility], [place] [address], [place], [place] [address]    Report Status PENDING  Incomplete     Liver Function Tests:  Recent Labs Lab 01/06/17 1208 01/07/17 0234  AST 40 54*  ALT 12* <5*  ALKPHOS 82 70  BILITOT 1.4* 0.7  PROT  6.0* 5.5*  ALBUMIN 3.1* 2.6*   No results for input(s): LIPASE, AMYLASE in the last 168 hours. No results for input(s): AMMONIA in the last 168 hours.  Cardiac Enzymes:  Recent Labs Lab 01/07/17 0234 01/07/17 0809 01/07/17 1409  TROPONINI 0.30* 0.27* 0.23*   BNP (last 3 results) No results for input(s): BNP in the last 8760 hours.  ProBNP (last 3 results) No results for input(s): PROBNP in the last 8760 hours.    Studies: No results found.  Scheduled Meds: . allopurinol  300 mg Oral q morning - 10a  . atorvastatin  40 mg Oral QHS  . carbidopa-levodopa  1 tablet Oral TID  . escitalopram  10 mg Oral q morning - 10a  . levothyroxine  25 mcg Oral QAC breakfast  . meropenem (MERREM) IV  1 g Intravenous Q8H  . nebivolol  10 mg Oral q morning - 10a  . rivaroxaban  20 mg Oral Q supper  . tamsulosin  0.4 mg Oral QHS      Time spent: 25 min  [facility] Pager [phone]. If 7PM-7AM, please contact night-coverage at [email or web address], Office  [phone]  password [email or web address] 01/09/2017, 4:09 PM  LOS: 3 days

## 2017-01-09 NOTE — Progress Notes (Addendum)
Subjective: Patient reports he's ready to get out of hospital. He passed void trial. PVR was 155 ml per nurse.   Objective: Vital signs in last 24 hours: Temp:  [98.2 F (36.8 C)-98.7 F (37.1 C)] 98.2 F (36.8 C) (02/01 1349) Pulse Rate:  [64-96] 64 (02/01 1349) Resp:  [18-19] 18 (02/01 1349) BP: (168-188)/(69-82) 168/78 (02/01 1358) SpO2:  [95 %] 95 % (02/01 1349)  Intake/Output from previous day: 01/31 0701 - 02/01 0700 In: 780 [P.O.:480; IV Piggyback:300] Out: 3600 [Urine:3600] Intake/Output this shift: Total I/O In: -  Out: 250 [Urine:250]  Physical Exam:  Looks well - sitting in chair eating dinner, talking with his daughter  Lab Results:  Recent Labs  01/07/17 0234 01/08/17 0510  HGB 9.1* 9.5*  HCT 27.2* 28.0*   BMET  Recent Labs  01/08/17 0510 01/09/17 0509  NA 138 138  K 3.1* 4.5  CL 103 103  CO2 25 27  GLUCOSE 118* 119*  BUN 14 13  CREATININE 1.03 1.07  CALCIUM 8.2* 8.6*   No results for input(s): LABPT, INR in the last 72 hours. No results for input(s): LABURIN in the last 72 hours. Results for orders placed or performed during the hospital encounter of 01/06/17  Urine culture     Status: Abnormal   Collection Time: 01/06/17 12:00 PM  Result Value Ref Range Status   Specimen Description URINE, CLEAN CATCH  Final   Special Requests NONE  Final   Culture (A)  Final    >=100,000 COLONIES/mL PROTEUS MIRABILIS >=100,000 COLONIES/mL ESCHERICHIA COLI    Report Status 01/09/2017 FINAL  Final   Organism ID, Bacteria PROTEUS MIRABILIS (A)  Final   Organism ID, Bacteria ESCHERICHIA COLI (A)  Final      Susceptibility   Escherichia coli - MIC*    AMPICILLIN <=2 SENSITIVE Sensitive     CEFAZOLIN <=4 SENSITIVE Sensitive     CEFTRIAXONE <=1 SENSITIVE Sensitive     CIPROFLOXACIN <=0.25 SENSITIVE Sensitive     GENTAMICIN <=1 SENSITIVE Sensitive     IMIPENEM <=0.25 SENSITIVE Sensitive     NITROFURANTOIN <=16 SENSITIVE Sensitive     TRIMETH/SULFA  <=20 SENSITIVE Sensitive     AMPICILLIN/SULBACTAM <=2 SENSITIVE Sensitive     PIP/TAZO <=4 SENSITIVE Sensitive     Extended ESBL NEGATIVE Sensitive     * >=100,000 COLONIES/mL ESCHERICHIA COLI   Proteus mirabilis - MIC*    AMPICILLIN <=2 SENSITIVE Sensitive     CEFAZOLIN <=4 SENSITIVE Sensitive     CEFTRIAXONE <=1 SENSITIVE Sensitive     CIPROFLOXACIN <=0.25 SENSITIVE Sensitive     GENTAMICIN <=1 SENSITIVE Sensitive     IMIPENEM 1 SENSITIVE Sensitive     NITROFURANTOIN 128 RESISTANT Resistant     TRIMETH/SULFA <=20 SENSITIVE Sensitive     AMPICILLIN/SULBACTAM <=2 SENSITIVE Sensitive     PIP/TAZO <=4 SENSITIVE Sensitive     * >=100,000 COLONIES/mL PROTEUS MIRABILIS  Culture, blood (Routine x 2)     Status: None (Preliminary result)   Collection Time: 01/06/17 12:09 PM  Result Value Ref Range Status   Specimen Description BLOOD LEFT HAND  Final   Special Requests BOTTLES DRAWN AEROBIC AND ANAEROBIC 5ML  Final   Culture   Final    NO GROWTH 3 DAYS Performed at [facility], [address], [place], [place] [address]    Report Status PENDING  Incomplete  Culture, blood (Routine x 2)     Status: None (Preliminary result)   Collection Time: 01/06/17  12:45 PM  Result Value Ref Range Status   Specimen Description BLOOD RIGHT ANTECUBITAL  Final   Special Requests BOTTLES DRAWN AEROBIC AND ANAEROBIC 5 CC EA  Final   Culture   Final    NO GROWTH 3 DAYS Performed at [facility], [address], [place], [place] [address]    Report Status PENDING  Incomplete    Studies/Results: No results found.  Assessment/Plan:  fever - from UTI - urine growing e coli and proteus. On abx.   BPH -  continue tamsulosin.    Urinary retention - resolved - pvr low.   Left hydro - expected after URS, stent/removal. Kindey fxn improved. Pt asymptomatic.    I will sign off. OK to d/c from GU pt of view. Please call with questions. I discussed patient with his wife as she was leaving  hospital.       LOS: 3 days   Paislynn Hegstrom 01/09/2017, 7:27 PM

## 2017-01-09 NOTE — Progress Notes (Signed)
Spoke with pt's wife at bedside concerning [facility].  Wife selected [facility].  Referral given to [place].

## 2017-01-09 NOTE — Progress Notes (Signed)
Physical Therapy Treatment Patient Details Name: Christian Wiley MRN: [id number] DOB: 1935-09-02 Today's Date: 01/09/2017    History of Present Illness Dartanion Pytel Lopezperez is a 82 y.o. male with medical history significant of HTN well controlled, multiples PE, BPH, Chronic back pain, Hypothyroidism and Parkinsonism, presented to the ED with alter mental status, fever and decrease urine output    PT Comments    Pt requesting to get up when therapist entered room. Both pt and wife declined hallway ambulation. Pt was agreeable to ambulating in room so took 2 laps around. He tolerated activity well. Will continue to follow.   Follow Up Recommendations  Home health PT;Supervision/Assistance - 24 hour     Equipment Recommendations  None recommended by PT    Recommendations for Other Services       Precautions / Restrictions Precautions Precautions: Fall Precaution Comments: has catheter at home Restrictions Weight Bearing Restrictions: No    Mobility  Bed Mobility Overal bed mobility: Needs Assistance Bed Mobility: Sidelying to Sit   Sidelying to sit: Supervision       General bed mobility comments: for safety. Pt used bedrail.   Transfers Overall transfer level: Needs assistance Equipment used: Rolling walker (2 wheeled) Transfers: Sit to/from Stand Sit to Stand: Min assist         General transfer comment: Assist to rise, steady.   Ambulation/Gait Ambulation/Gait assistance: Min guard Ambulation Distance (Feet): 30 Feet Assistive device: Rolling walker (2 wheeled) Gait Pattern/deviations: Step-through pattern;Decreased stride length     General Gait Details: took 2 laps in room. Pt and wife declined hallway ambulation. No LOB with use of walker. Pt tolerated distance well.    Stairs            Wheelchair Mobility    Modified Rankin (Stroke Patients Only)       Balance                                    Cognition Arousal/Alertness:  Awake/alert Behavior During Therapy: WFL for tasks assessed/performed Overall Cognitive Status: Within Functional Limits for tasks assessed                      Exercises      General Comments        Pertinent Vitals/Pain Pain Assessment: No/denies pain    Home Living                      Prior Function            PT Goals (current goals can now be found in the care plan section) Progress towards PT goals: Progressing toward goals    Frequency    Min 3X/week      PT Plan Current plan remains appropriate    Co-evaluation             End of Session Equipment Utilized During Treatment: Gait belt Activity Tolerance: Patient tolerated treatment well Patient left: in chair;with call bell/phone within reach;with family/visitor present;with chair alarm set     Time: 1112-1120 PT Time Calculation (min) (ACUTE ONLY): 8 min  Charges:  $Gait Training: 8-22 mins                    G Codes:      Weston Anna, MPT Pager: [phone]

## 2017-01-10 ENCOUNTER — Encounter (HOSPITAL_COMMUNITY): Payer: Self-pay | Admitting: Cardiology

## 2017-01-10 ENCOUNTER — Inpatient Hospital Stay (HOSPITAL_COMMUNITY): Payer: PPO

## 2017-01-10 DIAGNOSIS — R789 Finding of unspecified substance, not normally found in blood: Secondary | ICD-10-CM

## 2017-01-10 DIAGNOSIS — R748 Abnormal levels of other serum enzymes: Secondary | ICD-10-CM

## 2017-01-10 LAB — ECHOCARDIOGRAM COMPLETE
Height: 72 in
WEIGHTICAEL: 3697.6 [oz_av]

## 2017-01-10 MED ORDER — AMLODIPINE BESYLATE 5 MG PO TABS: 5.0000 mg | ORAL_TABLET | Freq: Every day | ORAL | 2 refills | Status: DC

## 2017-01-10 MED ORDER — SULFAMETHOXAZOLE-TRIMETHOPRIM 800-160 MG PO TABS
1.0000 | ORAL_TABLET | Freq: Two times a day (BID) | ORAL | Status: DC
  Administered 2017-01-10: 1 via ORAL
  Filled 2017-01-10: qty 1

## 2017-01-10 MED ORDER — SULFAMETHOXAZOLE-TRIMETHOPRIM 800-160 MG PO TABS: 1.0000 | ORAL_TABLET | Freq: Two times a day (BID) | ORAL | 0 refills | Status: DC

## 2017-01-10 NOTE — Progress Notes (Signed)
  Echocardiogram 2D Echocardiogram has been performed.  Christian Wiley 01/10/2017, 9:12 AM

## 2017-01-10 NOTE — Progress Notes (Signed)
Patient d/c home. Stable. 

## 2017-01-10 NOTE — Progress Notes (Signed)
Wife asked nurse to call MD to asked if husband will be d/c today. Per Dr. Darrick Meigs, echocardiogram still pending awaiting for result. Wife notified re: this matter.

## 2017-01-10 NOTE — Progress Notes (Signed)
Physical Therapy Treatment Patient Details Name: Christian Wiley MRN: [id number] DOB: 1935/02/11 Today's Date: 01/10/2017    History of Present Illness Maya Oria Belluomini is a 82 y.o. male with medical history significant of HTN well controlled, multiples PE, BPH, Chronic back pain, Hypothyroidism and Parkinsonism, presented to the ED with alter mental status, fever and decrease urine output    PT Comments    Progressing well; needs lots of encouragement and tends to ask wife to assist even when he does not need it; discussed with pt and wife to encourage independence to improve strength and activity tol; continue to recommend HHPT  Follow Up Recommendations  Home health PT;Supervision/Assistance - 24 hour     Equipment Recommendations  None recommended by PT    Recommendations for Other Services       Precautions / Restrictions Precautions Precautions: Fall Restrictions Weight Bearing Restrictions: No    Mobility  Bed Mobility Overal bed mobility: Needs Assistance Bed Mobility: Sidelying to Sit   Sidelying to sit: Supervision       General bed mobility comments: for safety. Pt used bedrail, incr time  Transfers Overall transfer level: Needs assistance Equipment used: Rolling walker (2 wheeled) Transfers: Sit to/from Stand Sit to Stand: Min guard;Supervision         General transfer comment: for safety, cues for hand placement and to control descent  Ambulation/Gait Ambulation/Gait assistance: Min guard;Supervision Ambulation Distance (Feet): 120 Feet (10') Assistive device: Rolling walker (2 wheeled) Gait Pattern/deviations: Step-through pattern;Decreased stride length;Trunk flexed     General Gait Details: max encouragement from PT and pt wife to amb in hallway; pt c/o dizziness but VSS and encouraged to continue; cues for incr step length and posture   Stairs            Wheelchair Mobility    Modified Rankin (Stroke Patients Only)        Balance     Sitting balance-Leahy Scale: Good     Standing balance support: Single extremity supported;During functional activity;No upper extremity supported Standing balance-Leahy Scale: Fair                      Cognition Arousal/Alertness: Awake/alert Behavior During Therapy: WFL for tasks assessed/performed Overall Cognitive Status: Within Functional Limits for tasks assessed         Following Commands: Follows one step commands with increased time       General Comments: pt requires max encouragement to participate    Exercises      General Comments        Pertinent Vitals/Pain Pain Assessment: No/denies pain    Home Living                      Prior Function            PT Goals (current goals can now be found in the care plan section) Acute Rehab PT Goals Patient Stated Goal: per wife, to walk by himself PT Goal Formulation: With family Time For Goal Achievement: 01/21/17 Potential to Achieve Goals: Good Progress towards PT goals: Progressing toward goals    Frequency    Min 3X/week      PT Plan Current plan remains appropriate    Co-evaluation             End of Session Equipment Utilized During Treatment: Gait belt Activity Tolerance: Patient tolerated treatment well Patient left: in chair;with call bell/phone within reach;with family/visitor present;with chair alarm set  Time: [id number] PT Time Calculation (min) (ACUTE ONLY): 20 min  Charges:  $Gait Training: 8-22 mins                    G Codes:      Ellisha Bankson 01/12/2017, 11:06 AM

## 2017-01-10 NOTE — Progress Notes (Signed)
Patient d/c instructions,follow-up appointments, new meds,home meds discussed with wife, verbalized understanding. I spoke with CM Cookie and said [facility]  PT/OT were set up ,wife notified. Patient denies pain.

## 2017-01-10 NOTE — Consult Note (Signed)
Cardiology Consult    Patient ID: Christian Wiley MRN: [id number], DOB/AGE: 1935-07-07   Admit date: 01/06/2017 Date of Consult: 01/10/2017  Primary Physician: Geoffery Lyons, MD Reason for Consult: Elevated troponin Primary Cardiologist: New-Shamikia Linskey Requesting Provider: Dr Darrick Meigs   History of Present Illness    Christian Wiley is a 82 y.o. with past medical history significant for HTN well controlled, Multiple PEs on Xarelto, HLD, BPH, chronic back pain, hypothyroidism, and Parkinson's disease who presented to the [place] ED on 01/06/2017 for altered mental status, fever and decreased urine output. We have been asked to consult for elevated troponin and EKG changes.  The patient has no history of cardiac events or procedures. He has no current chest pain, shortness of breath, orthopnea, palpitations, PND or syncope. He has had no symptoms prior to hospitalization except for mild DOE on strenuous activity like raking leaves and cutting the grass. He is fairly active in his daily life.   Troponins 0.30, 0.27, 0.23  EKG on 01/07/17 shows sinus rhythm with PAC's, RBBB, and diffuse T wave inversions               01/06/2017 shows NSR, RBBB, non-specific T wave changes inferiorly               10/01/2013 TWI in lead III  CXR 01/06/17: Patchy increased density in the left lower lobe consistent with atelectasis or early pneumonia. Possible trace pleural effusion. Low-grade compensated CHF.  Past Medical History   Past Medical History:  Diagnosis Date  . Anticoagulant long-term use    xarelto-- due to hx PE's   . Chronic constipation   . Chronic low back pain   . DDD (degenerative disc disease), lumbar   . Frequency of urination   . GERD (gastroesophageal reflux disease)   . H/O endovascular stent graft for abdominal aortic aneurysm followed by dr Scot Dock   04/ 2001  s/p infarenal AAA w/ AneuRx graft stent:  last duplex 08-14-2016 repair patent, AAA sac size 4.5cm x 4.72cm  (stable), no endoleak detected  . Hiatal hernia   . History of adenomatous polyp of colon    2010-- tubular adenoma and hyperplastic  . History of basal cell carcinoma excision    yrs ago  . History of BPH   . History of gout    per pt's wife gout stable as of 12-19-2016  . History of pulmonary embolus (PE)    04/ 2011 and 10/ 2014  . History of skin cancer   . Hyperlipidemia   . Hypertension   . Hypothyroidism   . Left ureteral stone   . Mild sleep apnea    per study 06-04-2016 very mild sleep apnea not obstructive , cpap not recommended  . Nocturia   . OA (osteoarthritis)   . RBBB (right bundle branch block)   . Urgency incontinence   . Vascular dementia   . Vascular parkinsonism [facility])    neurologist-  dr tat  . Wears glasses   . Wears hearing aid    bilateral   . Wears partial dentures    upper    Past Surgical History:  Procedure Laterality Date  . ABDOMINAL AORTIC ENDOVASCULAR STENT GRAFT  04/ 2001   dr Scot Dock   AneuRx graft stent  . CATARACT EXTRACTION W/ INTRAOCULAR LENS  IMPLANT, BILATERAL  2001 approx.  . CHOLECYSTECTOMY OPEN  1980's  . COLONOSCOPY  last one 01/ 2010  . CYSTOSCOPY/RETROGRADE/URETEROSCOPY/STONE EXTRACTION WITH BASKET Left 12/24/2016  Procedure: CYSTOSCOPY/RETROGRADE/URETEROSCOPY/STONE EXTRACTION WITH BASKET HOLMIUM LASER STENT PLACEMENT;  Surgeon: Festus Aloe, MD;  Location[facility];  Service: Urology;  Laterality: Left;  . EXCISION/RELEASE BURSA HIP Right 07/15/2014   Procedure: RIGHT HIP BURSECTOMY WITH TENDON REPAIR ;  Surgeon: Gearlean Alf, MD;  Location: [facility];  Service: Orthopedics;  Laterality: Right;  . HOLMIUM LASER APPLICATION Left [id number]   Procedure: HOLMIUM LASER APPLICATION;  Surgeon: Festus Aloe, MD;  Location: [facility];  Service: Urology;  Laterality: Left;  . KNEE ARTHROSCOPY Bilateral 1990's-- right x2 ;  left x1  . POSTERIOR LAMINECTOMY / DECOMPRESSION LUMBAR SPINE  03/24/2002     L2 -- S1  . POSTERIOR LUMBAR FUSION  11/16/2010   L2 -- L4  . SHOULDER OPEN ROTATOR CUFF REPAIR Bilateral lef 11/15/2009/  right 2013 approx  . TONSILLECTOMY  child  . TOTAL KNEE ARTHROPLASTY  right 08-08-2000/  left 01-28-2003  . TRANSURETHRAL RESECTION OF PROSTATE Left 12/24/2016   Procedure: TRANSURETHRAL RESECTION OF THE PROSTATE (TURP);  Surgeon: Festus Aloe, MD;  Location: [facility];  Service: Urology;  Laterality: Left;     Allergies  Allergies  Allergen Reactions  . Ambien [Zolpidem Tartrate]     Goes wild, gets anxious and confused with [facility]    Inpatient Medications    . allopurinol  300 mg Oral q morning - 10a  . atorvastatin  40 mg Oral QHS  . carbidopa-levodopa  1 tablet Oral TID  . ciprofloxacin  500 mg Oral BID  . escitalopram  10 mg Oral q morning - 10a  . levothyroxine  25 mcg Oral QAC breakfast  . nebivolol  10 mg Oral q morning - 10a  . rivaroxaban  20 mg Oral Q supper  . tamsulosin  0.4 mg Oral QHS    Family History    Family History  Problem Relation Age of Onset  . Hyperlipidemia Mother   . Hypertension Mother   . Hyperlipidemia Father   . Hypertension Father   . Heart disease Father   . Heart attack Father   . Cancer Sister   . Hyperlipidemia Brother   . Hypertension Daughter     Social History    Social History   Social History  . Marital status: Married    Spouse name: N/A  . Number of children: N/A  . Years of education: N/A   Occupational History  . Retired     [employer], parts dept   Social History Main Topics  . Smoking status: Former Smoker    Years: 20.00    Types: Cigarettes    Quit date: 10/01/1974  . Smokeless tobacco: Never Used  . Alcohol use No  . Drug use: No  . Sexual activity: Not on file   Other Topics Concern  . Not on file   Social History Narrative   Married   2 daughters, 1 son     Review of Systems   General:  No chills, fever, night sweats or weight changes.   Cardiovascular:  No chest pain, dyspnea on exertion, edema, orthopnea, palpitations, paroxysmal nocturnal dyspnea. Dermatological: No rash, lesions/masses Respiratory: No cough, dyspnea Urologic: No hematuria, dysuria Abdominal:   No nausea, vomiting, diarrhea, bright red blood per rectum, melena, or hematemesis Neurologic:  No visual changes, wkns, changes in mental status. All other systems reviewed and are otherwise negative except as noted above.  Physical Exam    Blood pressure (!) 175/78, pulse 62, temperature 98.7 F (  37.1 C), temperature source Oral, resp. rate 18, height 6' (1.829 m), weight 231 lb 1.6 oz (104.8 kg), SpO2 96 %.  General: Pleasant, NAD Psych: Normal affect. Neuro: Alert and oriented X 3. Moves all extremities spontaneously. HEENT: Normal  Neck: Supple without bruits or JVD. Lungs:  Resp regular and unlabored, CTA. Heart: RRR no s3, s4, or murmurs. Abdomen: Soft, non-tender, non-distended, BS + x 4.  Extremities: No clubbing, cyanosis or edema. DP/PT/Radials 2+ and equal bilaterally.  Labs    Troponin (Point of Care Test) No results for input(s): TROPIPOC in the last 72 hours.  Recent Labs  01/07/17 0809 01/07/17 1409  TROPONINI 0.27* 0.23*   Lab Results  Component Value Date   WBC 8.3 01/08/2017   HGB 9.5 (L) 01/08/2017   HCT 28.0 (L) 01/08/2017   MCV 89.7 01/08/2017   PLT 164 01/08/2017    Recent Labs Lab 01/07/17 0234  01/09/17 0509  NA 140  < > 138  K 3.6  < > 4.5  CL 108  < > 103  CO2 26  < > 27  BUN 19  < > 13  CREATININE 1.18  < > 1.07  CALCIUM 7.8*  < > 8.6*  PROT 5.5*  --   --   BILITOT 0.7  --   --   ALKPHOS 70  --   --   ALT <5*  --   --   AST 54*  --   --   GLUCOSE 115*  < > 119*  < > = values in this interval not displayed. No results found for: CHOL, HDL, LDLCALC, TRIG No results found for: [facility]   Radiology Studies    Dg Chest 2 View  Result Date: 01/06/2017 CLINICAL DATA:  Altered mental status, fever to  101.2 degrees, cloudy urine. History of Parkinson's disease. EXAM: CHEST  2 VIEW COMPARISON:  Chest x-ray of July 08, 2014 FINDINGS: The lungs are adequately inflated. There is hazy increased density at the left lung base. The heart is top-normal in size. The central pulmonary vascularity is prominent with mild cephalization. There is calcification in the wall of the aortic arch. There may be a trace left pleural effusion. The observed bony thorax is unremarkable. IMPRESSION: Patchy increased density in the left lower lobe consistent with atelectasis or early pneumonia. Possible trace pleural effusion. Low-grade compensated CHF. Followup PA and lateral chest X-ray is recommended in 3-4 weeks following trial of antibiotic therapy to ensure resolution. Electronically Signed   By: David  Martinique M.D.   On: 01/06/2017 13:22   Ct Abdomen Pelvis W Contrast  Result Date: 01/06/2017 CLINICAL DATA:  82 y/o M; kidney stones removed 12/24/2016 followed by catheter removal, hematuria, and replacement of catheter. Fever today. EXAM: CT ABDOMEN AND PELVIS WITH CONTRAST TECHNIQUE: Multidetector CT imaging of the abdomen and pelvis was performed using the standard protocol following bolus administration of intravenous contrast. CONTRAST:  165mL ISOVUE-300 IOPAMIDOL (ISOVUE-300) INJECTION 61% COMPARISON:  11/18/2016 CT of the abdomen and pelvis. FINDINGS: Lower chest: No acute abnormality. Hepatobiliary: Status post cholecystectomy. Mild intrahepatic biliary ductal dilatation is increased from the prior study no obstructing stone or lesion identified. The common bile duct measures up to 10 mm and is stable. No focal liver lesion. Pancreas: Unremarkable. No pancreatic ductal dilatation or surrounding inflammatory changes. Spleen: Normal in size without focal abnormality. Adrenals/Urinary Tract: Normal adrenal glands. Symmetric enhancement of the kidneys. Multiple nonobstructing kidney stones bilaterally. Stable nonobstructing  stone within the right renal pelvis.  No right-sided hydronephrosis. Mild left hydronephrosis new from the prior study with mild urothelial enhancement and without obstructing stone. The bladder is collapsed around Foley catheter. Stomach/Bowel: Stomach is within normal limits. Appendix appears normal. No evidence of bowel wall thickening, distention, or inflammatory changes. Extensive sigmoid diverticulosis without evidence for diverticulitis. Vascular/Lymphatic: Stable infrarenal abdominal aortic aneurysm measuring up to 4.9 cm. Aortic and bilateral iliac stents in situ. Aortic atherosclerosis. Reproductive: Stable prostate enlargement. Other: No ascites. Musculoskeletal: Severe lumbar spondylosis post L2 through L4 interbody fusion and L3 through L5 laminectomy. Bilateral hip osteoarthrosis. IMPRESSION: 1. Mild left-sided hydronephrosis with urothelial enhancement and without obstructing stone may represent a collecting system infection. Symmetric enhancement of the kidneys. No rim enhancing fluid collection identified. 2. Multiple nonobstructing kidney stones bilaterally including stable stone in the right renal pelvis. 3. Mild intrahepatic biliary ductal dilatation, slightly increased from prior study, may be compensatory post cholecystectomy, correlation with liver enzymes is recommended. 4. Sigmoid diverticulosis without evidence for diverticulitis. 5. Stable abdominal aortic aneurysm measuring up to 4.9 cm post aortic and bilateral iliac stenting. Electronically Signed   By: Kristine Garbe M.D.   On: 01/06/2017 14:23    EKG & Cardiac Imaging    EKG:  01/07/17 shows sinus rhythm with PAC's, RBBB, and diffuse T wave inversions               01/06/2017 shows NSR, RBBB, non-specific T wave changes inferiorly               10/01/2013 TWI in lead III  TELE: Sinus rhythm with frequent PAC's at times, occ PVC's, rates 60's  Echocardiogram: None on file  Assessment & Plan    Elevated  troponins -The patient has no history of cardiac events or procedures.  -Troponins 0.30, 0.27, 0.23 -EKG with diffuse t wave inversions, non-specific -No chest pain, dyspnea, orthopnea, palpitations, PND or lightheadedness -Troponins are mildly elevated and trending downward. Likely a result of demand ischemia in setting of fever and sepsis -No further cardiac evaluation necessary at this time, pending assessment by Dr. Percival Spanish -Advise outpatient follow up   Hypertension -Was initially hypotensive, resolved and now back on home med, Bystolic -Elevated [id number]. Continue bystolic. PRN hydralazine.  -Pt had lightheadedness and hypotension in 11/2016, amlodipine was discontinued. Advise to continue Bystolic and follow up with PCP for any additional meds for BP control.  History of multiple PE's -Continue Xarelto  AKI -Resolved with hydration  HLD -Followed by PCP -Continue statin  Signed, Daune Perch, NP-C 01/10/2017, 7:47 AM Pager: [phone]  History and all data above reviewed.  Patient examined.  I agree with the findings as above. He denies any chest pain.  No SOB.  No cardiac complaints but enzymes are elevated.   The patient exam reveals COR:RRR  ,  Lungs: Clear  ,  Abd: Positive bowel sounds, no rebound no guarding, Ext No edema  .  All available labs, radiology testing, previous records reviewed. Agree with documented assessment and plan. Elevated troponin:  Probably demand ischemia.  Pending the results of the echo I will likely suggest an out patient Lexiscan Myoview.  Of note the EKG is read by the computer as atrial fib but it is sinus with ectopy.  No change in therapy.    Jeneen Rinks [facility]  10:47 AM  01/10/2017

## 2017-01-11 LAB — CULTURE, BLOOD (ROUTINE X 2)
CULTURE: NO GROWTH
CULTURE: NO GROWTH

## 2017-01-14 DIAGNOSIS — N400 Enlarged prostate without lower urinary tract symptoms: Secondary | ICD-10-CM | POA: Diagnosis not present

## 2017-01-14 DIAGNOSIS — G214 Vascular parkinsonism: Secondary | ICD-10-CM | POA: Diagnosis not present

## 2017-01-14 DIAGNOSIS — N390 Urinary tract infection, site not specified: Secondary | ICD-10-CM | POA: Diagnosis not present

## 2017-01-14 DIAGNOSIS — A4189 Other specified sepsis: Secondary | ICD-10-CM | POA: Diagnosis not present

## 2017-01-14 DIAGNOSIS — A419 Sepsis, unspecified organism: Secondary | ICD-10-CM | POA: Diagnosis not present

## 2017-01-14 DIAGNOSIS — M545 Low back pain: Secondary | ICD-10-CM | POA: Diagnosis not present

## 2017-01-14 DIAGNOSIS — Z86711 Personal history of pulmonary embolism: Secondary | ICD-10-CM | POA: Diagnosis not present

## 2017-01-14 DIAGNOSIS — I10 Essential (primary) hypertension: Secondary | ICD-10-CM | POA: Diagnosis not present

## 2017-01-14 DIAGNOSIS — E785 Hyperlipidemia, unspecified: Secondary | ICD-10-CM | POA: Diagnosis not present

## 2017-01-14 DIAGNOSIS — Z87891 Personal history of nicotine dependence: Secondary | ICD-10-CM | POA: Diagnosis not present

## 2017-01-16 DIAGNOSIS — N390 Urinary tract infection, site not specified: Secondary | ICD-10-CM | POA: Diagnosis not present

## 2017-01-16 DIAGNOSIS — Z79899 Other long term (current) drug therapy: Secondary | ICD-10-CM | POA: Diagnosis not present

## 2017-01-16 DIAGNOSIS — R7302 Impaired glucose tolerance (oral): Secondary | ICD-10-CM | POA: Diagnosis not present

## 2017-01-22 DIAGNOSIS — R338 Other retention of urine: Secondary | ICD-10-CM | POA: Diagnosis not present

## 2017-01-28 DIAGNOSIS — R269 Unspecified abnormalities of gait and mobility: Secondary | ICD-10-CM | POA: Diagnosis not present

## 2017-01-29 DIAGNOSIS — I951 Orthostatic hypotension: Secondary | ICD-10-CM | POA: Diagnosis not present

## 2017-01-29 DIAGNOSIS — R42 Dizziness and giddiness: Secondary | ICD-10-CM | POA: Diagnosis not present

## 2017-01-29 DIAGNOSIS — G20 Parkinson's disease: Secondary | ICD-10-CM | POA: Diagnosis not present

## 2017-01-29 DIAGNOSIS — I2699 Other pulmonary embolism without acute cor pulmonale: Secondary | ICD-10-CM | POA: Diagnosis not present

## 2017-02-07 DIAGNOSIS — G214 Vascular parkinsonism: Secondary | ICD-10-CM | POA: Diagnosis not present

## 2017-02-07 DIAGNOSIS — Z87891 Personal history of nicotine dependence: Secondary | ICD-10-CM | POA: Diagnosis not present

## 2017-02-07 DIAGNOSIS — A419 Sepsis, unspecified organism: Secondary | ICD-10-CM | POA: Diagnosis not present

## 2017-02-07 DIAGNOSIS — Z86711 Personal history of pulmonary embolism: Secondary | ICD-10-CM | POA: Diagnosis not present

## 2017-02-07 DIAGNOSIS — N400 Enlarged prostate without lower urinary tract symptoms: Secondary | ICD-10-CM | POA: Diagnosis not present

## 2017-02-07 DIAGNOSIS — M545 Low back pain: Secondary | ICD-10-CM | POA: Diagnosis not present

## 2017-02-07 DIAGNOSIS — I10 Essential (primary) hypertension: Secondary | ICD-10-CM | POA: Diagnosis not present

## 2017-02-07 DIAGNOSIS — N390 Urinary tract infection, site not specified: Secondary | ICD-10-CM | POA: Diagnosis not present

## 2017-02-07 DIAGNOSIS — E785 Hyperlipidemia, unspecified: Secondary | ICD-10-CM | POA: Diagnosis not present

## 2017-02-08 DIAGNOSIS — E669 Obesity, unspecified: Secondary | ICD-10-CM | POA: Diagnosis not present

## 2017-02-08 DIAGNOSIS — G4730 Sleep apnea, unspecified: Secondary | ICD-10-CM | POA: Diagnosis not present

## 2017-02-08 DIAGNOSIS — N390 Urinary tract infection, site not specified: Secondary | ICD-10-CM | POA: Diagnosis not present

## 2017-02-08 DIAGNOSIS — Z7901 Long term (current) use of anticoagulants: Secondary | ICD-10-CM | POA: Diagnosis not present

## 2017-02-08 DIAGNOSIS — Z6829 Body mass index (BMI) 29.0-29.9, adult: Secondary | ICD-10-CM | POA: Diagnosis not present

## 2017-02-08 DIAGNOSIS — G20 Parkinson's disease: Secondary | ICD-10-CM | POA: Diagnosis not present

## 2017-02-08 DIAGNOSIS — M4726 Other spondylosis with radiculopathy, lumbar region: Secondary | ICD-10-CM | POA: Diagnosis not present

## 2017-02-08 DIAGNOSIS — E038 Other specified hypothyroidism: Secondary | ICD-10-CM | POA: Diagnosis not present

## 2017-02-08 DIAGNOSIS — G629 Polyneuropathy, unspecified: Secondary | ICD-10-CM | POA: Diagnosis not present

## 2017-02-08 DIAGNOSIS — R7302 Impaired glucose tolerance (oral): Secondary | ICD-10-CM | POA: Diagnosis not present

## 2017-02-08 DIAGNOSIS — K5909 Other constipation: Secondary | ICD-10-CM | POA: Diagnosis not present

## 2017-02-08 DIAGNOSIS — F1320 Sedative, hypnotic or anxiolytic dependence, uncomplicated: Secondary | ICD-10-CM | POA: Diagnosis not present

## 2017-02-13 DIAGNOSIS — Z87891 Personal history of nicotine dependence: Secondary | ICD-10-CM | POA: Diagnosis not present

## 2017-02-13 DIAGNOSIS — E785 Hyperlipidemia, unspecified: Secondary | ICD-10-CM | POA: Diagnosis not present

## 2017-02-13 DIAGNOSIS — A419 Sepsis, unspecified organism: Secondary | ICD-10-CM | POA: Diagnosis not present

## 2017-02-13 DIAGNOSIS — Z86711 Personal history of pulmonary embolism: Secondary | ICD-10-CM | POA: Diagnosis not present

## 2017-02-13 DIAGNOSIS — N400 Enlarged prostate without lower urinary tract symptoms: Secondary | ICD-10-CM | POA: Diagnosis not present

## 2017-02-13 DIAGNOSIS — M545 Low back pain: Secondary | ICD-10-CM | POA: Diagnosis not present

## 2017-02-13 DIAGNOSIS — N390 Urinary tract infection, site not specified: Secondary | ICD-10-CM | POA: Diagnosis not present

## 2017-02-13 DIAGNOSIS — I10 Essential (primary) hypertension: Secondary | ICD-10-CM | POA: Diagnosis not present

## 2017-02-13 DIAGNOSIS — G214 Vascular parkinsonism: Secondary | ICD-10-CM | POA: Diagnosis not present

## 2017-02-20 DIAGNOSIS — N200 Calculus of kidney: Secondary | ICD-10-CM | POA: Diagnosis not present

## 2017-02-21 DIAGNOSIS — Z86711 Personal history of pulmonary embolism: Secondary | ICD-10-CM | POA: Diagnosis not present

## 2017-02-21 DIAGNOSIS — Z87891 Personal history of nicotine dependence: Secondary | ICD-10-CM | POA: Diagnosis not present

## 2017-02-21 DIAGNOSIS — N400 Enlarged prostate without lower urinary tract symptoms: Secondary | ICD-10-CM | POA: Diagnosis not present

## 2017-02-21 DIAGNOSIS — N390 Urinary tract infection, site not specified: Secondary | ICD-10-CM | POA: Diagnosis not present

## 2017-02-21 DIAGNOSIS — M545 Low back pain: Secondary | ICD-10-CM | POA: Diagnosis not present

## 2017-02-21 DIAGNOSIS — I10 Essential (primary) hypertension: Secondary | ICD-10-CM | POA: Diagnosis not present

## 2017-02-21 DIAGNOSIS — G214 Vascular parkinsonism: Secondary | ICD-10-CM | POA: Diagnosis not present

## 2017-02-21 DIAGNOSIS — E785 Hyperlipidemia, unspecified: Secondary | ICD-10-CM | POA: Diagnosis not present

## 2017-02-21 DIAGNOSIS — A419 Sepsis, unspecified organism: Secondary | ICD-10-CM | POA: Diagnosis not present

## 2017-02-25 NOTE — Progress Notes (Signed)
Christian Wiley was seen today in the movement disorders clinic for neurologic consultation at the request of [name],[name], MD.  The consultation is for the evaluation of gait change and to r/o PD.  This patient is accompanied in the office by his spouse who supplements the history.   Pt states that he has been seen by his chiropractor, [name], for LBP and she recommended he get evaluated by a neurologist as she thought he had parkinsonian features.    09/19/15 update:  Pt returns for f/u accompanied by his wife who supplements the hx.  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  We decided to give him a trial of levodopa.  Today, the patient states that he is only taking it bid instead of the prescribed tid.  He is usually taking it at 10am and 5pm.  Tremor is improved.   He did have an MRI of the brain without gadolinium on 06/28/2015.  There was no significant change compared to his prior MRI of the brain in 2007.  There is evidence of mild small vessel disease.  PT was refused last visit.  He is c/o fatigue.  Has taken his carbidopa/levodopa 25/100 this AM but has not taken his bystolic.  12/21/15 update:  The patient is following up today, accompanied by his wife who supplements the history.  The patient has history of vascular parkinsonism.  Last visit, he was only taking his levodopa twice per day, and I encouraged him to take it 3 times per day as directed.  His wife states that he still doesn't get his midday dose in time.  He was supposed to be taking the medication at 10am (wake up time)/2 pm/6pm.  He has not wanted to attend physical therapy, but continues to exercise at the gym.  He has not had any falls, but does admit that he "slid" out of the bed trying to get out (got new higher mattress).  No hallucinations.  He has had episodes of confusion and will sometimes ask his wife, "who will keep the kids."  He stopped xanax about a week ago and confusion seems  better.  He takes 2 hydrocodone at bedtime for sleep.  That hasn't helped sleep but he is resistant to giving up the medication. No lightheadedness or near syncope.  03/21/16 update:  The patient follows up today, accompanied by his wife who supplements the history.  The patient has a history of vascular parkinsonism.  Last visit, I again encouraged the patient to take his carbidopa/levodopa 3 times per day as opposed to the twice per day that he was previously taking it.  He did set an alarm but is still inconsistent with that.   He did attend physical therapy since our last visit, and I congratulated him on that.  Last visit, we talked about memory loss and I thought that medications contribute, particularly the hydrocodone and I encouraged him to decrease that to 1 tablet per night and add melatonin for sleep.  He did decrease the hydrocodone.  The melatonin didn't help him sleep.  Wife states that memory has been stable but still has trouble with telling the day.  That is not new.  07/23/16 update:  The patient follows up today, accompanied by his wife who supplements the history.  He has a history of vascular parkinsonism and he is supposed to be on carbidopa/levodopa 25/100, one tablet 3 times per day .  He almost always misses the middle  of the day dose of levodopa despite an alarm.  He denies any falls since last visit.  Has dizziness with standing but no near syncope.  Only drinking 8oz of water per day.  No hallucinations.  He does have history of memory loss, and likely vascular dementia.  He has refused any medication for this.  Memory loss is likely exacerbated by medication such as hydrocodone.  He saw [name] since our last visit and had a nocturnal polysomnogram on 06/04/2016.  His apnea-hypopnea index was 8.1.  She did prescribe clonazepam for periodic limb movements, which she felt was consistent with his diagnosis of vascular parkinsonism.  Wife states that Friday pt saw the NP at PCP  office for crying spells and she put him on neudexta.  Only been on it for 3 days.  He is still on lexapro from [name].  Pt cannot state if crying spells associated with sadness but wife thinks that they are associated with sadness.  11/26/16 update:  The patient was up today, limited by his wife who supplements the history.  The patient is supposed to be carbidopa/levodopa 25/100 at 10 AM/2 PM/6 PM, but generally only takes the first and last dose.  He has had one fall while working on [employer].  He was pulling on the crow bar and fell over while pulling on it.  He didn't get hurt.   Some lightheadedness but no syncope.  His nurse practitioner placed him on Nuedexta and his wife states that they d/c that after our last visit.  He is also on Lexapro and wellbutrin was added by the PCP 2 months ago.  His wife states that crying spells improved but she isn't sure If that is when hallucinations started or not.  She came with a long list of issues that she wrote for me.  Pt thought last week that there was a boy under the bed and got a flashlight to look under the bed to look (this happened at about 4-5 am).  He denies other hallucinations, although his wife rates in her note that he will ask her what they're going to feet all the people at her house.  After awakening from a nap, he is asking for granddaughter who lives in [place] and will state that "she was here earlier."   On one occasion, the patient's wife woke up to find the patient looking for a pistol in the closet and when she asked him what he was doing he stated that he heard someone trying to get into the house.  On another occasion he her dog scratching at the door and when she got up and looked, she stated that she knew he had been up earlier because keys were in the door.  He did attend physical/occupational therapy in November.  I have reviewed those records.  He is no longer driving after an incident in which he was supposed to be driving  himself to therapy and then would go to his primary care physician.  It turns out, that he did go to therapy, but he then went to the dentist and they told him he did not have an appointment.  He never made it to the primary care doctor.  He tried to call his wife but he did not dial the area code, so the call did not go through.  He ultimately ended up at his house several hours later.  Last labs 2 weeks ago due to blood in urine.  Saw urology and has kidney stone.    02/27/17 update:  Patient follows up today with his wife who supplements the history.  Last visit, the patient was having more hallucinations, which seemed to perhaps start after Wellbutrin was added.  We held that and his crying spells did not return and hallucinations seems somewhat better, so we decided to keep him off of that.  He is still on Lexapro.  He does state that xanax 0.25 mg was started at night.  Wife states that they had some extra and he was having trouble sleeping so they restarted it.   He is supposed to be on carbidopa/levodopa 25/100 at 10 AM/2 PM/6 PM but often times only takes the first and last dose of that medication.  He often will miss the middle of the day dose.  I did review his medical records since our last visit.  He had lithotripsy for a kidney stone.  He was also admitted to the hospital in February with urosepsis.  He was much more confused during this period of time.  Wife states that it cleared up some when he came home but not quite back to baseline.  He does keep asking where his granddaughter is.  He is asking who will be with his dog when they leave.  He will ask "what are we going to give these people for dinner" but then when asked if he sees people, he denies it or says he saw someone leaving out the door.  He is doing better with walking.  No falls.  He completed PT last week.    Neuroimaging has previously been performed.  It is not available for my review today.  States that he had a CT brain a few  years ago after a fall.      ALLERGIES:   Allergies  Allergen Reactions  . Ambien [Zolpidem Tartrate]     Goes wild, gets anxious and confused with ambien    CURRENT MEDICATIONS:  Outpatient Encounter Prescriptions as of 02/27/2017  Medication Sig  . allopurinol (ZYLOPRIM) 300 MG tablet Take 300 mg by mouth every morning.   Marland Kitchen ALPRAZolam (XANAX) 0.5 MG tablet Take 0.25 mg by mouth at bedtime.  Marland Kitchen atorvastatin (LIPITOR) 40 MG tablet Take 1 tablet by mouth at bedtime.   . carbidopa-levodopa (SINEMET IR) 25-100 MG tablet TAKE 1 TABLET BY MOUTH 3 (THREE) TIMES DAILY. (Patient taking differently: Take 1 tablet by mouth 3 (three) times daily. TAKE 1 TABLET BY MOUTH 3 (THREE) TIMES DAILY.)  . escitalopram (LEXAPRO) 10 MG tablet Take 10 mg by mouth every morning.   Marland Kitchen HYDROcodone-acetaminophen (NORCO/VICODIN) 5-325 MG tablet TAKE 2 TABLETs EVERY 4 HOURS AS NEEDED FOR PAIN  . levothyroxine (SYNTHROID, LEVOTHROID) 25 MCG tablet Take 25 mcg by mouth daily before breakfast.   . nebivolol (BYSTOLIC) 10 MG tablet Take 10 mg by mouth every morning.  . polyethylene glycol (MIRALAX / GLYCOLAX) packet Take 17 g by mouth daily.  . rivaroxaban (XARELTO) 20 MG TABS tablet Take 1 tablet (20 mg total) by mouth daily with supper.  Marland Kitchen Pimavanserin Tartrate (NUPLAZID) 17 MG TABS Take 2 tablets by mouth daily.  . [DISCONTINUED] amLODipine (NORVASC) 5 MG tablet Take 1 tablet (5 mg total) by mouth daily.  . [DISCONTINUED] Sennosides (EX-LAX) 15 MG TABS Take 15 mg by mouth as needed (constipation).   . [DISCONTINUED] sulfamethoxazole-trimethoprim (BACTRIM DS,SEPTRA DS) 800-160 MG tablet Take 1 tablet by mouth every 12 (twelve) hours.  . [DISCONTINUED] tamsulosin (FLOMAX)  0.4 MG CAPS capsule Take 0.4 mg by mouth at bedtime.   No facility-administered encounter medications on file as of 02/27/2017.     PAST MEDICAL HISTORY:   Past Medical History:  Diagnosis Date  . Anticoagulant long-term use    xarelto-- due to hx  PE's   . Chronic constipation   . Chronic low back pain   . DDD (degenerative disc disease), lumbar   . GERD (gastroesophageal reflux disease)   . H/O endovascular stent graft for abdominal aortic aneurysm followed by dr Scot Dock   04/ 2001  s/p infarenal AAA w/ AneuRx graft stent:  last duplex 08-14-2016 repair patent, AAA sac size 4.5cm x 4.72cm (stable), no endoleak detected  . Hiatal hernia   . History of adenomatous polyp of colon    2010-- tubular adenoma and hyperplastic  . History of basal cell carcinoma excision    yrs ago  . History of BPH   . History of gout    per pt's wife gout stable as of 12-19-2016  . History of pulmonary embolus (PE)    04/ 2011 and 10/ 2014  . History of skin cancer   . Hyperlipidemia   . Hypertension   . Hypothyroidism   . Left ureteral stone   . Mild sleep apnea    per study 06-04-2016 very mild sleep apnea not obstructive , cpap not recommended  . OA (osteoarthritis)   . RBBB (right bundle branch block)   . Vascular dementia   . Vascular parkinsonism [facility])    neurologist-  dr tat  . Wears hearing aid    bilateral   . Wears partial dentures    upper    PAST SURGICAL HISTORY:   Past Surgical History:  Procedure Laterality Date  . ABDOMINAL AORTIC ENDOVASCULAR STENT GRAFT  04/ 2001   dr Scot Dock   AneuRx graft stent  . CATARACT EXTRACTION W/ INTRAOCULAR LENS  IMPLANT, BILATERAL  2001 approx.  . CHOLECYSTECTOMY OPEN  1980's  . COLONOSCOPY  last one 01/ 2010  . CYSTOSCOPY/RETROGRADE/URETEROSCOPY/STONE EXTRACTION WITH BASKET Left 12/24/2016   Procedure: CYSTOSCOPY/RETROGRADE/URETEROSCOPY/STONE EXTRACTION WITH BASKET HOLMIUM LASER STENT PLACEMENT;  Surgeon: Festus Aloe, MD;  Location: [facility];  Service: Urology;  Laterality: Left;  . EXCISION/RELEASE BURSA HIP Right 07/15/2014   Procedure: RIGHT HIP BURSECTOMY WITH TENDON REPAIR ;  Surgeon: Gearlean Alf, MD;  Location: [facility];  Service: Orthopedics;  Laterality: Right;   . HOLMIUM LASER APPLICATION Left 2/54/2706   Procedure: HOLMIUM LASER APPLICATION;  Surgeon: Festus Aloe, MD;  Location[facility];  Service: Urology;  Laterality: Left;  . KNEE ARTHROSCOPY Bilateral 1990's-- right x2 ;  left x1  . POSTERIOR LAMINECTOMY / DECOMPRESSION LUMBAR SPINE  03/24/2002   L2 -- S1  . POSTERIOR LUMBAR FUSION  11/16/2010   L2 -- L4  . SHOULDER OPEN ROTATOR CUFF REPAIR Bilateral lef 11/15/2009/  right 2013 approx  . TONSILLECTOMY  child  . TOTAL KNEE ARTHROPLASTY  right 08-08-2000/  left 01-28-2003  . TRANSURETHRAL RESECTION OF PROSTATE Left 12/24/2016   Procedure: TRANSURETHRAL RESECTION OF THE PROSTATE (TURP);  Surgeon: Festus Aloe, MD;  Location: [facility];  Service: Urology;  Laterality: Left;    SOCIAL HISTORY:   Social History   Social History  . Marital status: Married    Spouse name: N/A  . Number of children: 3  . Years of education: N/A   Occupational History  . Retired     [employer],  parts dept   Social History Main Topics  . Smoking status: Former Smoker    Years: 20.00    Types: Cigarettes    Quit date: 10/01/1974  . Smokeless tobacco: Never Used  . Alcohol use No  . Drug use: No  . Sexual activity: Not on file   Other Topics Concern  . Not on file   Social History Narrative   Married   2 daughters, 1 son    FAMILY HISTORY:   Family Status  Relation Status  . Mother Deceased   alzheimer dementia  . Father Deceased   CAD  . Brother Alive   healthy  . Sister Deceased   breast CA  . Sister Alive   DJD  . Child Alive   3, healthy  . Sister   . Brother   . Daughter     ROS:  A complete 10 system review of systems was obtained and was unremarkable apart from what is mentioned above.  PHYSICAL EXAMINATION:    VITALS:   Vitals:   02/27/17 1307  BP: 116/84  Pulse: (!) 57  Weight: 224 lb (101.6 kg)  Height: 6' (1.829 m)    GEN:  The patient appears stated age and is  in NAD. HEENT:  Normocephalic, atraumatic.  The mucous membranes are moist. The superficial temporal arteries are without ropiness or tenderness. CV:  Bradycardic.  Regular rhythm Lungs:  CTAB Neck/HEME:  There are no carotid bruits bilaterally.  Neurological examination:  Orientation:  Montreal Cognitive Assessment  03/21/2016  Visuospatial/ Executive (0/5) 1  Naming (0/3) 2  Attention: Read list of digits (0/2) 2  Attention: Read list of letters (0/1) 1  Attention: Serial 7 subtraction starting at 100 (0/3) 1  Language: Repeat phrase (0/2) 1  Language : Fluency (0/1) 1  Abstraction (0/2) 1  Delayed Recall (0/5) 1  Orientation (0/6) 3  Total 14  Adjusted Score (based on education) 15    Cranial nerves: There is good facial symmetry. Pupils are equal round and reactive to light bilaterally. Fundoscopic exam reveals clear margins bilaterally. Extraocular muscles are intact. The visual fields are full to confrontational testing. The speech is fluent and clear. Soft palate rises symmetrically and there is no tongue deviation. Hearing is intact to conversational tone. Sensation: Sensation is intact to light touch throughout Motor: Strength is 5/5 in the bilateral upper and lower extremities.   Shoulder shrug is equal and symmetric.  There is no pronator drift.   Movement examination: Tone: There is normal tone in the bilateral upper extremities.  The tone in the lower extremities is normal.  Abnormal movements: There is RUE resting tremor Coordination:  There is no decremation with RAM's Gait and Station: The patient is mildly short stepped with decreased arm swing on the left.    ASSESSMENT/PLAN:  1.  Parkinsonism  -I suspect that this represents vascular parkinsonism.  Marland Kitchen  Has set an alarm for taking carbidopa/levodopa 25/100 at 10 AM/2 PM/6 PM but still only takes 2 per day.  -needs to increase water intake greatly to help the lightheadedness.   2.  Dementia with  hallucinations  -MoCA 15 on 03/21/16.    -start nuplazid.  QT interval normal on 01/07/17.  Talked to them about black box warning.  Wife expressed understanding.  May take up to 6 weeks to help. 3.  Periodic limb movement disorder and very mild sleep apnea  -The patient saw [name] and had a sleep study on 06/04/2016.  AHI was only 8.1 and CPAP was not recommended.  The patient should sleep in the side-lying position.  She did recommend low-dose clonazepam at night. 4.  LBP  -The patient has seen Dr. Carloyn Manner and Dr. Nelva Bush along with chiropractics.  While I did see he had an MRI of his lumbar spine in 2014 that demonstrated moderate to severe neural foraminal stenosis at L4-L5 and significant degenerative changes, they are not interested in further surgery or injections and have failed alternative (chiropractic) tx.  Sports med (Dr. Tamala Julian) may be of value but will leave that to discretion of PCP. 5.  Depression  -on Lexapro, 10 mg daily  -Off Wellbutrin, which increased hallucinations. 6.  Lightheadedness  -improved. 7.  Follow up is anticipated in the next 3 months, sooner should new neurologic issues arise.  Much greater than 50% of this visit was spent in counseling with the patient and the wife.  Total face to face time:  40 min

## 2017-02-27 ENCOUNTER — Ambulatory Visit (INDEPENDENT_AMBULATORY_CARE_PROVIDER_SITE_OTHER): Payer: PPO | Admitting: Neurology

## 2017-02-27 ENCOUNTER — Encounter: Payer: Self-pay | Admitting: Neurology

## 2017-02-27 VITALS — BP 116/84 | HR 57 | Ht 72.0 in | Wt 224.0 lb

## 2017-02-27 DIAGNOSIS — G214 Vascular parkinsonism: Secondary | ICD-10-CM | POA: Diagnosis not present

## 2017-02-27 DIAGNOSIS — F01518 Vascular dementia, unspecified severity, with other behavioral disturbance: Secondary | ICD-10-CM

## 2017-02-27 DIAGNOSIS — F0151 Vascular dementia with behavioral disturbance: Secondary | ICD-10-CM | POA: Diagnosis not present

## 2017-02-27 DIAGNOSIS — R443 Hallucinations, unspecified: Secondary | ICD-10-CM

## 2017-02-27 MED ORDER — PIMAVANSERIN TARTRATE 17 MG PO TABS
2.0000 | ORAL_TABLET | Freq: Every day | ORAL | 0 refills | Status: DC
Start: 2017-02-27 — End: 2017-03-19

## 2017-03-19 ENCOUNTER — Other Ambulatory Visit: Payer: Self-pay | Admitting: Neurology

## 2017-03-19 MED ORDER — PIMAVANSERIN TARTRATE 17 MG PO TABS: 2.0000 | ORAL_TABLET | Freq: Every day | ORAL | 5 refills | Status: DC

## 2017-04-03 DIAGNOSIS — N1330 Unspecified hydronephrosis: Secondary | ICD-10-CM | POA: Diagnosis not present

## 2017-04-03 DIAGNOSIS — N401 Enlarged prostate with lower urinary tract symptoms: Secondary | ICD-10-CM | POA: Diagnosis not present

## 2017-04-03 DIAGNOSIS — N200 Calculus of kidney: Secondary | ICD-10-CM | POA: Diagnosis not present

## 2017-04-14 DIAGNOSIS — H524 Presbyopia: Secondary | ICD-10-CM | POA: Diagnosis not present

## 2017-04-14 DIAGNOSIS — H5211 Myopia, right eye: Secondary | ICD-10-CM | POA: Diagnosis not present

## 2017-04-14 DIAGNOSIS — Z961 Presence of intraocular lens: Secondary | ICD-10-CM | POA: Diagnosis not present

## 2017-04-17 DIAGNOSIS — D485 Neoplasm of uncertain behavior of skin: Secondary | ICD-10-CM | POA: Diagnosis not present

## 2017-04-17 DIAGNOSIS — D2262 Melanocytic nevi of left upper limb, including shoulder: Secondary | ICD-10-CM | POA: Diagnosis not present

## 2017-04-17 DIAGNOSIS — L570 Actinic keratosis: Secondary | ICD-10-CM | POA: Diagnosis not present

## 2017-04-17 DIAGNOSIS — D2239 Melanocytic nevi of other parts of face: Secondary | ICD-10-CM | POA: Diagnosis not present

## 2017-04-17 DIAGNOSIS — L820 Inflamed seborrheic keratosis: Secondary | ICD-10-CM | POA: Diagnosis not present

## 2017-04-17 DIAGNOSIS — D225 Melanocytic nevi of trunk: Secondary | ICD-10-CM | POA: Diagnosis not present

## 2017-04-17 DIAGNOSIS — L821 Other seborrheic keratosis: Secondary | ICD-10-CM | POA: Diagnosis not present

## 2017-04-17 DIAGNOSIS — D1801 Hemangioma of skin and subcutaneous tissue: Secondary | ICD-10-CM | POA: Diagnosis not present

## 2017-04-28 DIAGNOSIS — M4726 Other spondylosis with radiculopathy, lumbar region: Secondary | ICD-10-CM | POA: Diagnosis not present

## 2017-04-28 DIAGNOSIS — Z7901 Long term (current) use of anticoagulants: Secondary | ICD-10-CM | POA: Diagnosis not present

## 2017-04-28 DIAGNOSIS — G629 Polyneuropathy, unspecified: Secondary | ICD-10-CM | POA: Diagnosis not present

## 2017-04-28 DIAGNOSIS — E038 Other specified hypothyroidism: Secondary | ICD-10-CM | POA: Diagnosis not present

## 2017-04-28 DIAGNOSIS — I951 Orthostatic hypotension: Secondary | ICD-10-CM | POA: Diagnosis not present

## 2017-04-28 DIAGNOSIS — K5909 Other constipation: Secondary | ICD-10-CM | POA: Diagnosis not present

## 2017-04-28 DIAGNOSIS — Z79899 Other long term (current) drug therapy: Secondary | ICD-10-CM | POA: Diagnosis not present

## 2017-04-28 DIAGNOSIS — F1320 Sedative, hypnotic or anxiolytic dependence, uncomplicated: Secondary | ICD-10-CM | POA: Diagnosis not present

## 2017-04-28 DIAGNOSIS — Z6831 Body mass index (BMI) 31.0-31.9, adult: Secondary | ICD-10-CM | POA: Diagnosis not present

## 2017-04-28 DIAGNOSIS — R7302 Impaired glucose tolerance (oral): Secondary | ICD-10-CM | POA: Diagnosis not present

## 2017-04-28 DIAGNOSIS — G20 Parkinson's disease: Secondary | ICD-10-CM | POA: Diagnosis not present

## 2017-04-28 DIAGNOSIS — G4730 Sleep apnea, unspecified: Secondary | ICD-10-CM | POA: Diagnosis not present

## 2017-05-12 NOTE — Addendum Note (Signed)
Addendum  created 05/12/17 1006 by Myrtie Soman, MD   Sign clinical note

## 2017-05-28 ENCOUNTER — Other Ambulatory Visit: Payer: Self-pay | Admitting: Neurology

## 2017-05-30 DIAGNOSIS — M65332 Trigger finger, left middle finger: Secondary | ICD-10-CM | POA: Diagnosis not present

## 2017-05-30 DIAGNOSIS — M79645 Pain in left finger(s): Secondary | ICD-10-CM | POA: Diagnosis not present

## 2017-06-26 NOTE — Progress Notes (Signed)
Christian Wiley was seen today in the movement disorders clinic for neurologic consultation at the request of [name], MD.  The consultation is for the evaluation of gait change and to r/o PD.  This patient is accompanied in the office by his spouse who supplements the history.   Pt states that he has been seen by his chiropractor, [name], for LBP and she recommended he get evaluated by a neurologist as she thought he had parkinsonian features.    09/19/15 update:  Pt returns for f/u accompanied by his wife who supplements the hx.  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  We decided to give him a trial of levodopa.  Today, the patient states that he is only taking it bid instead of the prescribed tid.  He is usually taking it at 10am and 5pm.  Tremor is improved.   He did have an MRI of the brain without gadolinium on 06/28/2015.  There was no significant change compared to his prior MRI of the brain in 2007.  There is evidence of mild small vessel disease.  PT was refused last visit.  He is c/o fatigue.  Has taken his carbidopa/levodopa 25/100 this AM but has not taken his bystolic.  12/21/15 update:  The patient is following up today, accompanied by his wife who supplements the history.  The patient has history of vascular parkinsonism.  Last visit, he was only taking his levodopa twice per day, and I encouraged him to take it 3 times per day as directed.  His wife states that he still doesn't get his midday dose in time.  He was supposed to be taking the medication at 10am (wake up time)/2 pm/6pm.  He has not wanted to attend physical therapy, but continues to exercise at the gym.  He has not had any falls, but does admit that he "slid" out of the bed trying to get out (got new higher mattress).  No hallucinations.  He has had episodes of confusion and will sometimes ask his wife, "who will keep the kids."  He stopped xanax about a week ago and confusion seems  better.  He takes 2 hydrocodone at bedtime for sleep.  That hasn't helped sleep but he is resistant to giving up the medication. No lightheadedness or near syncope.  03/21/16 update:  The patient follows up today, accompanied by his wife who supplements the history.  The patient has a history of vascular parkinsonism.  Last visit, I again encouraged the patient to take his carbidopa/levodopa 3 times per day as opposed to the twice per day that he was previously taking it.  He did set an alarm but is still inconsistent with that.   He did attend physical therapy since our last visit, and I congratulated him on that.  Last visit, we talked about memory loss and I thought that medications contribute, particularly the hydrocodone and I encouraged him to decrease that to 1 tablet per night and add melatonin for sleep.  He did decrease the hydrocodone.  The melatonin didn't help him sleep.  Wife states that memory has been stable but still has trouble with telling the day.  That is not new.  07/23/16 update:  The patient follows up today, accompanied by his wife who supplements the history.  He has a history of vascular parkinsonism and he is supposed to be on carbidopa/levodopa 25/100, one tablet 3 times per day .  He almost always misses the middle  of the day dose of levodopa despite an alarm.  He denies any falls since last visit.  Has dizziness with standing but no near syncope.  Only drinking 8oz of water per day.  No hallucinations.  He does have history of memory loss, and likely vascular dementia.  He has refused any medication for this.  Memory loss is likely exacerbated by medication such as hydrocodone.  He saw [name] since our last visit and had a nocturnal polysomnogram on 06/04/2016.  His apnea-hypopnea index was 8.1.  She did prescribe clonazepam for periodic limb movements, which she felt was consistent with his diagnosis of vascular parkinsonism.  Wife states that Friday pt saw the NP at PCP  office for crying spells and she put him on neudexta.  Only been on it for 3 days.  He is still on lexapro from [name].  Pt cannot state if crying spells associated with sadness but wife thinks that they are associated with sadness.  11/26/16 update:  The patient was up today, limited by his wife who supplements the history.  The patient is supposed to be carbidopa/levodopa 25/100 at 10 AM/2 PM/6 PM, but generally only takes the first and last dose.  He has had one fall while working on [employer].  He was pulling on the crow bar and fell over while pulling on it.  He didn't get hurt.   Some lightheadedness but no syncope.  His nurse practitioner placed him on Nuedexta and his wife states that they d/c that after our last visit.  He is also on Lexapro and wellbutrin was added by the PCP 2 months ago.  His wife states that crying spells improved but she isn't sure If that is when hallucinations started or not.  She came with a long list of issues that she wrote for me.  Pt thought last week that there was a boy under the bed and got a flashlight to look under the bed to look (this happened at about 4-5 am).  He denies other hallucinations, although his wife rates in her note that he will ask her what they're going to feet all the people at her house.  After awakening from a nap, he is asking for granddaughter who lives in [place] and will state that "she was here earlier."   On one occasion, the patient's wife woke up to find the patient looking for a pistol in the closet and when she asked him what he was doing he stated that he heard someone trying to get into the house.  On another occasion he her dog scratching at the door and when she got up and looked, she stated that she knew he had been up earlier because keys were in the door.  He did attend physical/occupational therapy in November.  I have reviewed those records.  He is no longer driving after an incident in which he was supposed to be driving  himself to therapy and then would go to his primary care physician.  It turns out, that he did go to therapy, but he then went to the dentist and they told him he did not have an appointment.  He never made it to the primary care doctor.  He tried to call his wife but he did not dial the area code, so the call did not go through.  He ultimately ended up at his house several hours later.  Last labs 2 weeks ago due to blood in urine.  Saw urology and has kidney stone.    02/27/17 update:  Patient follows up today with his wife who supplements the history.  Last visit, the patient was having more hallucinations, which seemed to perhaps start after Wellbutrin was added.  We held that and his crying spells did not return and hallucinations seems somewhat better, so we decided to keep him off of that.  He is still on Lexapro.  He does state that xanax 0.25 mg was started at night.  Wife states that they had some extra and he was having trouble sleeping so they restarted it.   He is supposed to be on carbidopa/levodopa 25/100 at 10 AM/2 PM/6 PM but often times only takes the first and last dose of that medication.  He often will miss the middle of the day dose.  I did review his medical records since our last visit.  He had lithotripsy for a kidney stone.  He was also admitted to the hospital in February with urosepsis.  He was much more confused during this period of time.  Wife states that it cleared up some when he came home but not quite back to baseline.  He does keep asking where his granddaughter is.  He is asking who will be with his dog when they leave.  He will ask "what are we going to give these people for dinner" but then when asked if he sees people, he denies it or says he saw someone leaving out the door.  He is doing better with walking.  No falls.  He completed PT last week.    06/30/17 update: Patient seen today in follow-up, accompanied by his wife who supplements the history.  Patient is supposed to  be on carbidopa/levodopa 25/100 3 times per day, and wife states that he is actually doing much better at taking it tid as they are setting a phone alarm.   Last visit, we decided to start nuplazid because of hallucinations.  This is helping him.  He takes 2 tablets at dinner and he is not asking wife nearly as much "who is at the house."  Today he did ask her "if the kids are covered up" but that is pretty rare.  He is actually back to doing projects in the house (painting doors).    Neuroimaging has previously been performed.  It is not available for my review today.  States that he had a CT brain a few years ago after a fall.      ALLERGIES:   Allergies  Allergen Reactions  . Ambien [Zolpidem Tartrate]     Goes wild, gets anxious and confused with ambien    CURRENT MEDICATIONS:  Outpatient Encounter Prescriptions as of 06/30/2017  Medication Sig  . allopurinol (ZYLOPRIM) 300 MG tablet Take 300 mg by mouth every morning.   Marland Kitchen ALPRAZolam (XANAX) 0.5 MG tablet Take 0.25 mg by mouth at bedtime.  Marland Kitchen atorvastatin (LIPITOR) 40 MG tablet Take 1 tablet by mouth at bedtime.   . carbidopa-levodopa (SINEMET IR) 25-100 MG tablet TAKE 1 TABLET 3 TIMES A DAY  . escitalopram (LEXAPRO) 10 MG tablet Take 10 mg by mouth every morning.   Marland Kitchen HYDROcodone-acetaminophen (NORCO/VICODIN) 5-325 MG tablet TAKE 2 TABLETs EVERY 4 HOURS AS NEEDED FOR PAIN  . levothyroxine (SYNTHROID, LEVOTHROID) 25 MCG tablet Take 25 mcg by mouth daily before breakfast.   . nebivolol (BYSTOLIC) 10 MG tablet Take 10 mg by mouth every morning.  . Pimavanserin Tartrate (NUPLAZID) 17 MG TABS  Take 2 tablets by mouth daily.  . polyethylene glycol (MIRALAX / GLYCOLAX) packet Take 17 g by mouth daily.  . rivaroxaban (XARELTO) 20 MG TABS tablet Take 1 tablet (20 mg total) by mouth daily with supper.   No facility-administered encounter medications on file as of 06/30/2017.     PAST MEDICAL HISTORY:   Past Medical History:  Diagnosis Date  .  Anticoagulant long-term use    xarelto-- due to hx PE's   . Chronic constipation   . Chronic low back pain   . DDD (degenerative disc disease), lumbar   . GERD (gastroesophageal reflux disease)   . H/O endovascular stent graft for abdominal aortic aneurysm followed by dr Scot Dock   04/ 2001  s/p infarenal AAA w/ AneuRx graft stent:  last duplex 08-14-2016 repair patent, AAA sac size 4.5cm x 4.72cm (stable), no endoleak detected  . Hiatal hernia   . History of adenomatous polyp of colon    2010-- tubular adenoma and hyperplastic  . History of basal cell carcinoma excision    yrs ago  . History of BPH   . History of gout    per pt's wife gout stable as of 12-19-2016  . History of pulmonary embolus (PE)    04/ 2011 and 10/ 2014  . History of skin cancer   . Hyperlipidemia   . Hypertension   . Hypothyroidism   . Left ureteral stone   . Mild sleep apnea    per study 06-04-2016 very mild sleep apnea not obstructive , cpap not recommended  . OA (osteoarthritis)   . RBBB (right bundle branch block)   . Vascular dementia   . Vascular parkinsonism [facility])    neurologist-  dr Tiny Chaudhary  . Wears hearing aid    bilateral   . Wears partial dentures    upper    PAST SURGICAL HISTORY:   Past Surgical History:  Procedure Laterality Date  . ABDOMINAL AORTIC ENDOVASCULAR STENT GRAFT  04/ 2001   dr Scot Dock   AneuRx graft stent  . CATARACT EXTRACTION W/ INTRAOCULAR LENS  IMPLANT, BILATERAL  2001 approx.  . CHOLECYSTECTOMY OPEN  1980's  . COLONOSCOPY  last one 01/ 2010  . CYSTOSCOPY/RETROGRADE/URETEROSCOPY/STONE EXTRACTION WITH BASKET Left 12/24/2016   Procedure: CYSTOSCOPY/RETROGRADE/URETEROSCOPY/STONE EXTRACTION WITH BASKET HOLMIUM LASER STENT PLACEMENT;  Surgeon: Festus Aloe, MD;  Location: [facility];  Service: Urology;  Laterality: Left;  . EXCISION/RELEASE BURSA HIP Right 07/15/2014   Procedure: RIGHT HIP BURSECTOMY WITH TENDON REPAIR ;  Surgeon: Gearlean Alf, MD;   Location: [facility];  Service: Orthopedics;  Laterality: Right;  . HOLMIUM LASER APPLICATION Left 5/63/1497   Procedure: HOLMIUM LASER APPLICATION;  Surgeon: Festus Aloe, MD;  Location: [facility];  Service: Urology;  Laterality: Left;  . KNEE ARTHROSCOPY Bilateral 1990's-- right x2 ;  left x1  . POSTERIOR LAMINECTOMY / DECOMPRESSION LUMBAR SPINE  03/24/2002   L2 -- S1  . POSTERIOR LUMBAR FUSION  11/16/2010   L2 -- L4  . SHOULDER OPEN ROTATOR CUFF REPAIR Bilateral lef 11/15/2009/  right 2013 approx  . TONSILLECTOMY  child  . TOTAL KNEE ARTHROPLASTY  right 08-08-2000/  left 01-28-2003  . TRANSURETHRAL RESECTION OF PROSTATE Left 12/24/2016   Procedure: TRANSURETHRAL RESECTION OF THE PROSTATE (TURP);  Surgeon: Festus Aloe, MD;  Location: [facility];  Service: Urology;  Laterality: Left;    SOCIAL HISTORY:   Social History   Social History  . Marital status: Married  Spouse name: N/A  . Number of children: 3  . Years of education: N/A   Occupational History  . Retired     [employer], parts dept   Social History Main Topics  . Smoking status: Former Smoker    Years: 20.00    Types: Cigarettes    Quit date: 10/01/1974  . Smokeless tobacco: Never Used  . Alcohol use No  . Drug use: No  . Sexual activity: Not on file   Other Topics Concern  . Not on file   Social History Narrative   Married   2 daughters, 1 son    FAMILY HISTORY:   Family Status  Relation Status  . Mother Deceased       alzheimer dementia  . Father Deceased       CAD  . Brother Alive       healthy  . Sister Deceased       breast CA  . Sister Alive       DJD  . Child Alive       3, healthy  . Sister (Not Specified)  . Brother (Not Specified)  . Daughter (Not Specified)    ROS:  A complete 10 system review of systems was obtained and was unremarkable apart from what is mentioned above.  PHYSICAL EXAMINATION:    VITALS:   There were no vitals  filed for this visit.  GEN:  The patient appears stated age and is in NAD. HEENT:  Normocephalic, atraumatic.  The mucous membranes are moist. The superficial temporal arteries are without ropiness or tenderness. CV:  Bradycardic.  Regular rhythm Lungs:  CTAB Neck/HEME:  There are no carotid bruits bilaterally.  Neurological examination:  Orientation:  Montreal Cognitive Assessment  03/21/2016  Visuospatial/ Executive (0/5) 1  Naming (0/3) 2  Attention: Read list of digits (0/2) 2  Attention: Read list of letters (0/1) 1  Attention: Serial 7 subtraction starting at 100 (0/3) 1  Language: Repeat phrase (0/2) 1  Language : Fluency (0/1) 1  Abstraction (0/2) 1  Delayed Recall (0/5) 1  Orientation (0/6) 3  Total 14  Adjusted Score (based on education) 15    Cranial nerves: There is good facial symmetry. Pupils are equal round and reactive to light bilaterally. Fundoscopic exam reveals clear margins bilaterally. Extraocular muscles are intact. The visual fields are full to confrontational testing. The speech is fluent and clear. Soft palate rises symmetrically and there is no tongue deviation. Hearing is intact to conversational tone. Sensation: Sensation is intact to light touch throughout Motor: Strength is 5/5 in the bilateral upper and lower extremities.   Shoulder shrug is equal and symmetric.  There is no pronator drift.   Movement examination: Tone: There is normal tone in the bilateral upper extremities.  The tone in the lower extremities is normal.  Abnormal movements: There is RUE resting tremor Coordination:  There is slowing with alternation of supination/pronation of the forearm on the L.   Gait and Station: The patient is mildly short stepped with decreased arm swing on the left.    ASSESSMENT/PLAN:  1.  Parkinsonism  -I suspect that this represents vascular parkinsonism.  Marland Kitchen  Has set an alarm for taking carbidopa/levodopa 25/100 at 10 AM/2 PM/6 PM and doing better with  compliance  -needs to increase water intake greatly to help the lightheadedness.   2.  Dementia with hallucinations  -MoCA 15 on 03/21/16.    -on nuplazid and doing much better.  Discussed r/b/se  and wife states "I feel good about him taking it." 3.  Periodic limb movement disorder and very mild sleep apnea  -The patient saw [name] and had a sleep study on 06/04/2016.  AHI was only 8.1 and CPAP was not recommended.  The patient should sleep in the side-lying position.  She did recommend low-dose clonazepam at night. 4.  LBP  -The patient has seen Dr. Carloyn Manner and Dr. Nelva Bush along with chiropractics.  While I did see he had an MRI of his lumbar spine in 2014 that demonstrated moderate to severe neural foraminal stenosis at L4-L5 and significant degenerative changes, they are not interested in further surgery or injections and have failed alternative (chiropractic) tx.  Sports med (Dr. Tamala Julian) may be of value but will leave that to discretion of PCP. 5.  Depression  -on Lexapro, 10 mg daily  -Off Wellbutrin, which increased hallucinations. 6.  Lightheadedness  -improved. 7.  Follow up is anticipated in the next 3 months, sooner should new neurologic issues arise.  Much greater than 50% of this visit was spent in counseling with the patient and the wife.  Total face to face time:  40 min

## 2017-06-30 ENCOUNTER — Encounter: Payer: Self-pay | Admitting: Neurology

## 2017-06-30 ENCOUNTER — Ambulatory Visit (INDEPENDENT_AMBULATORY_CARE_PROVIDER_SITE_OTHER): Payer: PPO | Admitting: Neurology

## 2017-06-30 VITALS — BP 110/70 | HR 56 | Ht 72.0 in | Wt 223.0 lb

## 2017-06-30 DIAGNOSIS — F0151 Vascular dementia with behavioral disturbance: Secondary | ICD-10-CM | POA: Diagnosis not present

## 2017-06-30 DIAGNOSIS — R443 Hallucinations, unspecified: Secondary | ICD-10-CM | POA: Diagnosis not present

## 2017-06-30 DIAGNOSIS — G214 Vascular parkinsonism: Secondary | ICD-10-CM | POA: Diagnosis not present

## 2017-06-30 DIAGNOSIS — F01518 Vascular dementia, unspecified severity, with other behavioral disturbance: Secondary | ICD-10-CM

## 2017-06-30 MED ORDER — CARBIDOPA-LEVODOPA 25-100 MG PO TABS: 1.0000 | ORAL_TABLET | Freq: Three times a day (TID) | ORAL | 1 refills | Status: DC

## 2017-06-30 MED ORDER — PIMAVANSERIN TARTRATE 17 MG PO TABS: 2.0000 | ORAL_TABLET | Freq: Every day | ORAL | 1 refills | Status: DC

## 2017-06-30 NOTE — Addendum Note (Signed)
Addended byAnnamaria Helling on: 06/30/2017 11:33 AM   Modules accepted: Orders

## 2017-07-02 DIAGNOSIS — M65332 Trigger finger, left middle finger: Secondary | ICD-10-CM | POA: Diagnosis not present

## 2017-07-21 DIAGNOSIS — E038 Other specified hypothyroidism: Secondary | ICD-10-CM | POA: Diagnosis not present

## 2017-07-21 DIAGNOSIS — R5383 Other fatigue: Secondary | ICD-10-CM | POA: Diagnosis not present

## 2017-07-21 DIAGNOSIS — Z6829 Body mass index (BMI) 29.0-29.9, adult: Secondary | ICD-10-CM | POA: Diagnosis not present

## 2017-07-21 DIAGNOSIS — R630 Anorexia: Secondary | ICD-10-CM | POA: Diagnosis not present

## 2017-07-21 DIAGNOSIS — R634 Abnormal weight loss: Secondary | ICD-10-CM | POA: Diagnosis not present

## 2017-07-22 ENCOUNTER — Telehealth: Payer: Self-pay | Admitting: Neurology

## 2017-07-22 MED ORDER — PIMAVANSERIN TARTRATE 17 MG PO TABS: 2.0000 | ORAL_TABLET | Freq: Every day | ORAL | 1 refills | Status: DC

## 2017-07-22 NOTE — Telephone Encounter (Signed)
Patient's wife called regarding his Nuplazid medication. He only has 1 month left. They need to see if Dr. Carles Collet can have a prescription faxed [phone] or called in at [phone] for a 6 month refill. Please call to let her know once the refill has been sent. Thanks

## 2017-07-22 NOTE — Telephone Encounter (Signed)
RX sent. Patient made aware.  

## 2017-07-22 NOTE — Telephone Encounter (Signed)
PT's wife called back and said to please include PT's name and DOB on this prescription request

## 2017-07-25 ENCOUNTER — Other Ambulatory Visit: Payer: Self-pay | Admitting: Neurology

## 2017-07-25 ENCOUNTER — Telehealth: Payer: Self-pay | Admitting: Neurology

## 2017-07-25 MED ORDER — PIMAVANSERIN TARTRATE 34 MG PO CAPS: 1.0000 | ORAL_CAPSULE | Freq: Every day | ORAL | 1 refills | Status: DC

## 2017-07-25 NOTE — Telephone Encounter (Signed)
Patient's wife made aware Nuplazid is being converted to once a day tablet instead of two tablets once a day. RX has already been sent and they expressed understanding.

## 2017-08-14 DIAGNOSIS — R351 Nocturia: Secondary | ICD-10-CM | POA: Diagnosis not present

## 2017-08-14 DIAGNOSIS — N200 Calculus of kidney: Secondary | ICD-10-CM | POA: Diagnosis not present

## 2017-08-14 DIAGNOSIS — N401 Enlarged prostate with lower urinary tract symptoms: Secondary | ICD-10-CM | POA: Diagnosis not present

## 2017-08-20 DIAGNOSIS — R634 Abnormal weight loss: Secondary | ICD-10-CM | POA: Diagnosis not present

## 2017-08-20 DIAGNOSIS — G20 Parkinson's disease: Secondary | ICD-10-CM | POA: Diagnosis not present

## 2017-08-20 DIAGNOSIS — R5383 Other fatigue: Secondary | ICD-10-CM | POA: Diagnosis not present

## 2017-08-20 DIAGNOSIS — Z6829 Body mass index (BMI) 29.0-29.9, adult: Secondary | ICD-10-CM | POA: Diagnosis not present

## 2017-08-29 DIAGNOSIS — H6123 Impacted cerumen, bilateral: Secondary | ICD-10-CM | POA: Diagnosis not present

## 2017-09-15 DIAGNOSIS — J029 Acute pharyngitis, unspecified: Secondary | ICD-10-CM | POA: Diagnosis not present

## 2017-09-15 DIAGNOSIS — Z23 Encounter for immunization: Secondary | ICD-10-CM | POA: Diagnosis not present

## 2017-09-15 DIAGNOSIS — Z6829 Body mass index (BMI) 29.0-29.9, adult: Secondary | ICD-10-CM | POA: Diagnosis not present

## 2017-09-15 DIAGNOSIS — R634 Abnormal weight loss: Secondary | ICD-10-CM | POA: Diagnosis not present

## 2017-10-09 NOTE — Progress Notes (Addendum)
Christian Wiley was seen today in the movement disorders clinic for neurologic consultation at the request of [name], MD.  The consultation is for the evaluation of gait change and to r/o PD.  This patient is accompanied in the office by his spouse who supplements the history.   Pt states that he has been seen by his chiropractor, [name], for LBP and she recommended he get evaluated by a neurologist as she thought he had parkinsonian features.    09/19/15 update:  Pt returns for f/u accompanied by his wife who supplements the hx.  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  We decided to give him a trial of levodopa.  Today, the patient states that he is only taking it bid instead of the prescribed tid.  He is usually taking it at 10am and 5pm.  Tremor is improved.   He did have an MRI of the brain without gadolinium on 06/28/2015.  There was no significant change compared to his prior MRI of the brain in 2007.  There is evidence of mild small vessel disease.  PT was refused last visit.  He is c/o fatigue.  Has taken his carbidopa/levodopa 25/100 this AM but has not taken his bystolic.  12/21/15 update:  The patient is following up today, accompanied by his wife who supplements the history.  The patient has history of vascular parkinsonism.  Last visit, he was only taking his levodopa twice per day, and I encouraged him to take it 3 times per day as directed.  His wife states that he still doesn't get his midday dose in time.  He was supposed to be taking the medication at 10am (wake up time)/2 pm/6pm.  He has not wanted to attend physical therapy, but continues to exercise at the gym.  He has not had any falls, but does admit that he "slid" out of the bed trying to get out (got new higher mattress).  No hallucinations.  He has had episodes of confusion and will sometimes ask his wife, "who will keep the kids."  He stopped xanax about a week ago and confusion seems  better.  He takes 2 hydrocodone at bedtime for sleep.  That hasn't helped sleep but he is resistant to giving up the medication. No lightheadedness or near syncope.  03/21/16 update:  The patient follows up today, accompanied by his wife who supplements the history.  The patient has a history of vascular parkinsonism.  Last visit, I again encouraged the patient to take his carbidopa/levodopa 3 times per day as opposed to the twice per day that he was previously taking it.  He did set an alarm but is still inconsistent with that.   He did attend physical therapy since our last visit, and I congratulated him on that.  Last visit, we talked about memory loss and I thought that medications contribute, particularly the hydrocodone and I encouraged him to decrease that to 1 tablet per night and add melatonin for sleep.  He did decrease the hydrocodone.  The melatonin didn't help him sleep.  Wife states that memory has been stable but still has trouble with telling the day.  That is not new.  07/23/16 update:  The patient follows up today, accompanied by his wife who supplements the history.  He has a history of vascular parkinsonism and he is supposed to be on carbidopa/levodopa 25/100, one tablet 3 times per day .  He almost always misses the middle  of the day dose of levodopa despite an alarm.  He denies any falls since last visit.  Has dizziness with standing but no near syncope.  Only drinking 8oz of water per day.  No hallucinations.  He does have history of memory loss, and likely vascular dementia.  He has refused any medication for this.  Memory loss is likely exacerbated by medication such as hydrocodone.  He saw [name] since our last visit and had a nocturnal polysomnogram on 06/04/2016.  His apnea-hypopnea index was 8.1.  She did prescribe clonazepam for periodic limb movements, which she felt was consistent with his diagnosis of vascular parkinsonism.  Wife states that Friday pt saw the NP at PCP  office for crying spells and she put him on neudexta.  Only been on it for 3 days.  He is still on lexapro from [name].  Pt cannot state if crying spells associated with sadness but wife thinks that they are associated with sadness.  11/26/16 update:  The patient was up today, limited by his wife who supplements the history.  The patient is supposed to be carbidopa/levodopa 25/100 at 10 AM/2 PM/6 PM, but generally only takes the first and last dose.  He has had one fall while working on [employer].  He was pulling on the crow bar and fell over while pulling on it.  He didn't get hurt.   Some lightheadedness but no syncope.  His nurse practitioner placed him on Nuedexta and his wife states that they d/c that after our last visit.  He is also on Lexapro and wellbutrin was added by the PCP 2 months ago.  His wife states that crying spells improved but she isn't sure If that is when hallucinations started or not.  She came with a long list of issues that she wrote for me.  Pt thought last week that there was a boy under the bed and got a flashlight to look under the bed to look (this happened at about 4-5 am).  He denies other hallucinations, although his wife rates in her note that he will ask her what they're going to feet all the people at her house.  After awakening from a nap, he is asking for granddaughter who lives in [place] and will state that "she was here earlier."   On one occasion, the patient's wife woke up to find the patient looking for a pistol in the closet and when she asked him what he was doing he stated that he heard someone trying to get into the house.  On another occasion he her dog scratching at the door and when she got up and looked, she stated that she knew he had been up earlier because keys were in the door.  He did attend physical/occupational therapy in November.  I have reviewed those records.  He is no longer driving after an incident in which he was supposed to be driving  himself to therapy and then would go to his primary care physician.  It turns out, that he did go to therapy, but he then went to the dentist and they told him he did not have an appointment.  He never made it to the primary care doctor.  He tried to call his wife but he did not dial the area code, so the call did not go through.  He ultimately ended up at his house several hours later.  Last labs 2 weeks ago due to blood in urine.  Saw urology and has kidney stone.    02/27/17 update:  Patient follows up today with his wife who supplements the history.  Last visit, the patient was having more hallucinations, which seemed to perhaps start after Wellbutrin was added.  We held that and his crying spells did not return and hallucinations seems somewhat better, so we decided to keep him off of that.  He is still on Lexapro.  He does state that xanax 0.25 mg was started at night.  Wife states that they had some extra and he was having trouble sleeping so they restarted it.   He is supposed to be on carbidopa/levodopa 25/100 at 10 AM/2 PM/6 PM but often times only takes the first and last dose of that medication.  He often will miss the middle of the day dose.  I did review his medical records since our last visit.  He had lithotripsy for a kidney stone.  He was also admitted to the hospital in February with urosepsis.  He was much more confused during this period of time.  Wife states that it cleared up some when he came home but not quite back to baseline.  He does keep asking where his granddaughter is.  He is asking who will be with his dog when they leave.  He will ask "what are we going to give these people for dinner" but then when asked if he sees people, he denies it or says he saw someone leaving out the door.  He is doing better with walking.  No falls.  He completed PT last week.    06/30/17 update: Patient seen today in follow-up, accompanied by his wife who supplements the history.  Patient is supposed to  be on carbidopa/levodopa 25/100 3 times per day, and wife states that he is actually doing much better at taking it tid as they are setting a phone alarm.   Last visit, we decided to start nuplazid because of hallucinations.  This is helping him.  He takes 2 tablets at dinner and he is not asking wife nearly as much "who is at the house."  Today he did ask her "if the kids are covered up" but that is pretty rare.  He is actually back to doing projects in the house (painting doors).    10/13/17 update: Patient is seen today in follow-up for parkinsonism.  He is accompanied by his wife who supplements the history.  Patient is on carbidopa/levodopa 25/100, 1 tablet 3 times per day. Wife states that they are still having trouble getting in the last pill of the day even though they have an alarm set.  Wife worries about fact that alarm may ring when they are ready to eat protein.   He remains on Nuplazid for his hallucinations, 34 mg daily.  Pt states that he is doing good.   Wife states that he does ask about "what are we going to give the kids for breakfast" and "are we going to take the kids with [place]."  She doesn't think that he sees the kids, but he asks about kids all the time.  They are not scary.  He tells me today "I saw them earlier today but not now."   Wife states that he has a "fear" of "high places" like their porch, which doesn't have a rail.  He has not had falls.  Uses cane at all times.  He has started reading again for leisure.  He helped to paint his daughters  house.  Still has chronic LBP but helps with aleve.  Neuroimaging has previously been performed.  It is not available for my review today.  States that he had a CT brain a few years ago after a fall.      ALLERGIES:   Allergies  Allergen Reactions  . Ambien [Zolpidem Tartrate]     Goes wild, gets anxious and confused with ambien    CURRENT MEDICATIONS:  Outpatient Encounter Medications as of 10/13/2017  Medication Sig  .  allopurinol (ZYLOPRIM) 300 MG tablet Take 300 mg by mouth every morning.   Marland Kitchen ALPRAZolam (XANAX) 0.5 MG tablet Take 0.25 mg by mouth at bedtime.  Marland Kitchen atorvastatin (LIPITOR) 40 MG tablet Take 1 tablet by mouth at bedtime.   . carbidopa-levodopa (SINEMET IR) 25-100 MG tablet Take 1 tablet by mouth 3 (three) times daily.  Marland Kitchen escitalopram (LEXAPRO) 10 MG tablet Take 10 mg by mouth every morning.   . finasteride (PROSCAR) 5 MG tablet Take 5 mg daily by mouth.  . fluticasone (FLONASE) 50 MCG/ACT nasal spray 1 spray at bedtime.  Marland Kitchen HYDROcodone-acetaminophen (NORCO/VICODIN) 5-325 MG tablet 2 tablets in the morning  . levothyroxine (SYNTHROID, LEVOTHROID) 25 MCG tablet Take 25 mcg by mouth daily before breakfast.   . nebivolol (BYSTOLIC) 10 MG tablet Take 5 mg by mouth every morning.   Marland Kitchen omeprazole (PRILOSEC) 40 MG capsule Take 20 mg daily by mouth.  Marland Kitchen Pimavanserin Tartrate (NUPLAZID) 34 MG CAPS Take 1 capsule by mouth daily.  . polyethylene glycol (MIRALAX / GLYCOLAX) packet Take 17 g by mouth daily.  . rivaroxaban (XARELTO) 20 MG TABS tablet Take 1 tablet (20 mg total) by mouth daily with supper.   No facility-administered encounter medications on file as of 10/13/2017.     PAST MEDICAL HISTORY:   Past Medical History:  Diagnosis Date  . Anticoagulant long-term use    xarelto-- due to hx PE's   . Chronic constipation   . Chronic low back pain   . DDD (degenerative disc disease), lumbar   . GERD (gastroesophageal reflux disease)   . H/O endovascular stent graft for abdominal aortic aneurysm followed by dr Scot Dock   04/ 2001  s/p infarenal AAA w/ AneuRx graft stent:  last duplex 08-14-2016 repair patent, AAA sac size 4.5cm x 4.72cm (stable), no endoleak detected  . Hiatal hernia   . History of adenomatous polyp of colon    2010-- tubular adenoma and hyperplastic  . History of basal cell carcinoma excision    yrs ago  . History of BPH   . History of gout    per pt's wife gout stable as of  12-19-2016  . History of pulmonary embolus (PE)    04/ 2011 and 10/ 2014  . History of skin cancer   . Hyperlipidemia   . Hypertension   . Hypothyroidism   . Left ureteral stone   . Mild sleep apnea    per study 06-04-2016 very mild sleep apnea not obstructive , cpap not recommended  . OA (osteoarthritis)   . RBBB (right bundle branch block)   . Vascular dementia   . Vascular parkinsonism [facility])    neurologist-  dr tat  . Wears hearing aid    bilateral   . Wears partial dentures    upper    PAST SURGICAL HISTORY:   Past Surgical History:  Procedure Laterality Date  . ABDOMINAL AORTIC ENDOVASCULAR STENT GRAFT  04/ 2001   dr Scot Dock   AneuRx graft stent  .  CATARACT EXTRACTION W/ INTRAOCULAR LENS  IMPLANT, BILATERAL  2001 approx.  . CHOLECYSTECTOMY OPEN  1980's  . COLONOSCOPY  last one 01/ 2010  . KNEE ARTHROSCOPY Bilateral 1990's-- right x2 ;  left x1  . POSTERIOR LAMINECTOMY / DECOMPRESSION LUMBAR SPINE  03/24/2002   L2 -- S1  . POSTERIOR LUMBAR FUSION  11/16/2010   L2 -- L4  . SHOULDER OPEN ROTATOR CUFF REPAIR Bilateral lef 11/15/2009/  right 2013 approx  . TONSILLECTOMY  child  . TOTAL KNEE ARTHROPLASTY  right 08-08-2000/  left 01-28-2003    SOCIAL HISTORY:   Social History   Socioeconomic History  . Marital status: Married    Spouse name: Not on file  . Number of children: 3  . Years of education: Not on file  . Highest education level: Not on file  Social Needs  . Financial resource strain: Not on file  . Food insecurity - worry: Not on file  . Food insecurity - inability: Not on file  . Transportation needs - medical: Not on file  . Transportation needs - non-medical: Not on file  Occupational History  . Occupation: Retired    Comment: Pension scheme manager, parts dept  Tobacco Use  . Smoking status: Former Smoker    Years: 20.00    Types: Cigarettes    Last attempt to quit: 10/01/1974    Years since quitting: 43.0  . Smokeless tobacco: Never Used    Substance and Sexual Activity  . Alcohol use: No    Alcohol/week: 0.0 oz  . Drug use: No  . Sexual activity: Not on file  Other Topics Concern  . Not on file  Social History Narrative   Married   2 daughters, 1 son    FAMILY HISTORY:   Family Status  Relation Name Status  . Mother  Deceased       alzheimer dementia  . Father  Deceased       CAD  . Brother  Alive       healthy  . Sister  Deceased       breast CA  . Sister  Alive       DJD  . Child  Alive       3, healthy  . Sister  (Not Specified)  . Brother  (Not Specified)  . Daughter  (Not Specified)    ROS:  A complete 10 system review of systems was obtained and was unremarkable apart from what is mentioned above.  PHYSICAL EXAMINATION:    VITALS:   Vitals:   10/13/17 0844  BP: 120/70  Pulse: (!) 54  SpO2: 96%  Weight: 217 lb (98.4 kg)  Height: 6' (1.829 m)   Wt Readings from Last 3 Encounters:  10/13/17 217 lb (98.4 kg)  06/30/17 223 lb (101.2 kg)  02/27/17 224 lb (101.6 kg)     GEN:  The patient appears stated age and is in NAD. HEENT:  Normocephalic, atraumatic.  The mucous membranes are moist. The superficial temporal arteries are without ropiness or tenderness. CV:  Bradycardic.  Regular rhythm Lungs:  CTAB Neck/HEME:  There are no carotid bruits bilaterally.  Neurological examination:  Orientation:  Montreal Cognitive Assessment  03/21/2016  Visuospatial/ Executive (0/5) 1  Naming (0/3) 2  Attention: Read list of digits (0/2) 2  Attention: Read list of letters (0/1) 1  Attention: Serial 7 subtraction starting at 100 (0/3) 1  Language: Repeat phrase (0/2) 1  Language : Fluency (0/1) 1  Abstraction (0/2) 1  Delayed Recall (0/5) 1  Orientation (0/6) 3  Total 14  Adjusted Score (based on education) 15    Cranial nerves: There is good facial symmetry. Pupils are equal round and reactive to light bilaterally. Fundoscopic exam reveals clear margins bilaterally. Extraocular muscles are  intact. The visual fields are full to confrontational testing. The speech is fluent and clear. Soft palate rises symmetrically and there is no tongue deviation. Hearing is intact to conversational tone. Sensation: Sensation is intact to light touch throughout Motor: Strength is 5/5 in the bilateral upper and lower extremities.   Shoulder shrug is equal and symmetric.  There is no pronator drift.   Movement examination: Tone: There is normal tone in the bilateral upper extremities.  The tone in the lower extremities is normal.  Abnormal movements: There is RUE resting tremor Coordination:  There is slowing with alternation of supination/pronation of the forearm on the L.   Gait and Station: The patient is mildly short stepped with decreased arm swing on the left.    Addendum labs:  Lab work received from primary care physician dated October 20, 2017.  Sodium was 141, potassium 4.1, chloride 104, CO2 29, BUN 15 and creatinine 0.9.  White blood cells were 6.8, hemoglobin 13.8, hematocrit 42.0 and platelets 146.  Total cholesterol is 134, HDL 38, LDL 81, TSH 1.26  ASSESSMENT/PLAN:  1.  Parkinsonism  -I suspect that this represents vascular parkinsonism.   Has set an alarm for taking carbidopa/levodopa 25/100 at 10 AM/2 PM/6 PM.  Told wife to go ahead and take even if eating the meal as right now they are just skipping the last one. 2.  Dementia with hallucinations  -MoCA 15 on 03/21/16.  Don't do MoCA any longer   -on nuplazid and doing much better.  Discussed FDA findings today and they are okay with findings.  He still has hallucinations but are not scary. 3.  Periodic limb movement disorder and very mild sleep apnea  -The patient saw [name] and had a sleep study on 06/04/2016.  AHI was only 8.1 and CPAP was not recommended.  The patient should sleep in the side-lying position.  She did recommend low-dose clonazepam at night. 4.  LBP  -The patient has seen Dr. Carloyn Manner and Dr. Nelva Bush along  with chiropractics.  While I did see he had an MRI of his lumbar spine in 2014 that demonstrated moderate to severe neural foraminal stenosis at L4-L5 and significant degenerative changes, they are not interested in further surgery or injections and have failed alternative (chiropractic) tx.  Sports med (Dr. Tamala Julian) may be of value but will leave that to discretion of PCP. 5.  Depression  -on Lexapro, 10 mg daily  -Off Wellbutrin, which increased hallucinations. 6.  Lightheadedness  -improved. 7. Follow up is anticipated in the next few months, sooner should new neurologic issues arise.  Much greater than 50% of this visit was spent in counseling and coordinating care.  Total face to face time:  25 min.  In the meantime, wife states that she will try to get labs for me from [facility].

## 2017-10-13 ENCOUNTER — Encounter: Payer: Self-pay | Admitting: Neurology

## 2017-10-13 ENCOUNTER — Ambulatory Visit: Payer: PPO | Admitting: Neurology

## 2017-10-13 VITALS — BP 120/70 | HR 54 | Ht 72.0 in | Wt 217.0 lb

## 2017-10-13 DIAGNOSIS — F0151 Vascular dementia with behavioral disturbance: Secondary | ICD-10-CM

## 2017-10-13 DIAGNOSIS — F01518 Vascular dementia, unspecified severity, with other behavioral disturbance: Secondary | ICD-10-CM

## 2017-10-13 MED ORDER — CARBIDOPA-LEVODOPA 25-100 MG PO TABS: 1.0000 | ORAL_TABLET | Freq: Three times a day (TID) | ORAL | 1 refills | Status: DC

## 2017-10-20 DIAGNOSIS — M1A9XX0 Chronic gout, unspecified, without tophus (tophi): Secondary | ICD-10-CM | POA: Diagnosis not present

## 2017-10-20 DIAGNOSIS — E038 Other specified hypothyroidism: Secondary | ICD-10-CM | POA: Diagnosis not present

## 2017-10-20 DIAGNOSIS — R7302 Impaired glucose tolerance (oral): Secondary | ICD-10-CM | POA: Diagnosis not present

## 2017-10-20 DIAGNOSIS — R82998 Other abnormal findings in urine: Secondary | ICD-10-CM | POA: Diagnosis not present

## 2017-10-20 DIAGNOSIS — H903 Sensorineural hearing loss, bilateral: Secondary | ICD-10-CM | POA: Diagnosis not present

## 2017-10-20 DIAGNOSIS — I10 Essential (primary) hypertension: Secondary | ICD-10-CM | POA: Diagnosis not present

## 2017-10-20 DIAGNOSIS — E7849 Other hyperlipidemia: Secondary | ICD-10-CM | POA: Diagnosis not present

## 2017-10-20 DIAGNOSIS — Z125 Encounter for screening for malignant neoplasm of prostate: Secondary | ICD-10-CM | POA: Diagnosis not present

## 2017-10-27 DIAGNOSIS — I951 Orthostatic hypotension: Secondary | ICD-10-CM | POA: Diagnosis not present

## 2017-10-27 DIAGNOSIS — E038 Other specified hypothyroidism: Secondary | ICD-10-CM | POA: Diagnosis not present

## 2017-10-27 DIAGNOSIS — Z7901 Long term (current) use of anticoagulants: Secondary | ICD-10-CM | POA: Diagnosis not present

## 2017-10-27 DIAGNOSIS — R7302 Impaired glucose tolerance (oral): Secondary | ICD-10-CM | POA: Diagnosis not present

## 2017-10-27 DIAGNOSIS — Z6829 Body mass index (BMI) 29.0-29.9, adult: Secondary | ICD-10-CM | POA: Diagnosis not present

## 2017-10-27 DIAGNOSIS — M4726 Other spondylosis with radiculopathy, lumbar region: Secondary | ICD-10-CM | POA: Diagnosis not present

## 2017-10-27 DIAGNOSIS — G4739 Other sleep apnea: Secondary | ICD-10-CM | POA: Diagnosis not present

## 2017-10-27 DIAGNOSIS — Z79899 Other long term (current) drug therapy: Secondary | ICD-10-CM | POA: Diagnosis not present

## 2017-10-27 DIAGNOSIS — Z1389 Encounter for screening for other disorder: Secondary | ICD-10-CM | POA: Diagnosis not present

## 2017-10-27 DIAGNOSIS — F1320 Sedative, hypnotic or anxiolytic dependence, uncomplicated: Secondary | ICD-10-CM | POA: Diagnosis not present

## 2017-10-27 DIAGNOSIS — G20 Parkinson's disease: Secondary | ICD-10-CM | POA: Diagnosis not present

## 2017-10-27 DIAGNOSIS — Z0000 Encounter for general adult medical examination without abnormal findings: Secondary | ICD-10-CM | POA: Diagnosis not present

## 2018-01-21 ENCOUNTER — Other Ambulatory Visit: Payer: Self-pay | Admitting: Neurology

## 2018-02-09 ENCOUNTER — Other Ambulatory Visit (HOSPITAL_COMMUNITY): Payer: PPO

## 2018-02-09 ENCOUNTER — Ambulatory Visit: Payer: PPO | Admitting: Family

## 2018-02-16 ENCOUNTER — Other Ambulatory Visit: Payer: Self-pay

## 2018-02-16 ENCOUNTER — Ambulatory Visit: Payer: PPO | Admitting: Family

## 2018-02-16 ENCOUNTER — Encounter: Payer: Self-pay | Admitting: Family

## 2018-02-16 ENCOUNTER — Ambulatory Visit (HOSPITAL_COMMUNITY)
Admission: RE | Admit: 2018-02-16 | Discharge: 2018-02-16 | Disposition: A | Discharge status: Home / Self Care | Payer: PPO | Source: Ambulatory Visit | Attending: Family | Admitting: Family

## 2018-02-16 VITALS — BP 140/78 | HR 62 | Temp 97.7°F | Resp 18 | Ht 72.0 in | Wt 202.0 lb

## 2018-02-16 DIAGNOSIS — I714 Abdominal aortic aneurysm, without rupture, unspecified: Secondary | ICD-10-CM

## 2018-02-16 DIAGNOSIS — Z95828 Presence of other vascular implants and grafts: Secondary | ICD-10-CM

## 2018-02-16 NOTE — Patient Instructions (Signed)
Before your next abdominal ultrasound:  Take two Extra-Strength Gas-X capsules at bedtime the night before the test. Take another two Extra-Strength Gas-X capsules 3 hours before the test.  Avoid gas forming foods the day before the test.       

## 2018-02-16 NOTE — Progress Notes (Signed)
[facility] [place]  CC: Follow up s/p Endovascular Repair of Abdominal Aortic Aneurysm    History of Present Illness  Christian Wiley is a 83 y.o. (1935/03/27) male who is s/p endovascular aneurysm repair in 2001 by Dr. Scot Dock for a 4.8 cm infrarenal abdominal aortic aneurysm. At the time of his 2014 visit the aneurysm was stable in size at 4.8 cm and he was scheduled for an 18 month follow up visit.   He does continue to have some chronic low back pain which is not new. He denies any significant abdominal pain.  He denies any known history of stroke or TIA. He denies any claudication sx's in his legs with walking. He denies any history of rest pain or history of nonhealing ulcers.   November 2018 serum creatine was 0.9, from PCP   He takes Xarelto for hx of PE.  Pt Diabetic: No Pt smoker: former smoker, quit about 1975, started at age 22   Past Medical History:  Diagnosis Date  . Anticoagulant long-term use    xarelto-- due to hx PE's   . Chronic constipation   . Chronic low back pain   . DDD (degenerative disc disease), lumbar   . GERD (gastroesophageal reflux disease)   . H/O endovascular stent graft for abdominal aortic aneurysm followed by dr Scot Dock   04/ 2001  s/p infarenal AAA w/ AneuRx graft stent:  last duplex 08-14-2016 repair patent, AAA sac size 4.5cm x 4.72cm (stable), no endoleak detected  . Hiatal hernia   . History of adenomatous polyp of colon    2010-- tubular adenoma and hyperplastic  . History of basal cell carcinoma excision    yrs ago  . History of BPH   . History of gout    per pt's wife gout stable as of 12-19-2016  . History of pulmonary embolus (PE)    04/ 2011 and 10/ 2014  . History of skin cancer   . Hyperlipidemia   . Hypertension   . Hypothyroidism   . Left ureteral stone   . Mild sleep apnea    per study 06-04-2016 very mild sleep apnea not obstructive , cpap not recommended  . OA (osteoarthritis)   . RBBB  (right bundle branch block)   . Vascular dementia   . Vascular parkinsonism [facility])    neurologist-  dr tat  . Wears hearing aid    bilateral   . Wears partial dentures    upper   Past Surgical History:  Procedure Laterality Date  . ABDOMINAL AORTIC ENDOVASCULAR STENT GRAFT  04/ 2001   dr Scot Dock   AneuRx graft stent  . CATARACT EXTRACTION W/ INTRAOCULAR LENS  IMPLANT, BILATERAL  2001 approx.  . CHOLECYSTECTOMY OPEN  1980's  . COLONOSCOPY  last one 01/ 2010  . CYSTOSCOPY/RETROGRADE/URETEROSCOPY/STONE EXTRACTION WITH BASKET Left 12/24/2016   Procedure: CYSTOSCOPY/RETROGRADE/URETEROSCOPY/STONE EXTRACTION WITH BASKET HOLMIUM LASER STENT PLACEMENT;  Surgeon: Festus Aloe, MD;  Location: [facility];  Service: Urology;  Laterality: Left;  . EXCISION/RELEASE BURSA HIP Right 07/15/2014   Procedure: RIGHT HIP BURSECTOMY WITH TENDON REPAIR ;  Surgeon: Gearlean Alf, MD;  Location: [facility];  Service: Orthopedics;  Laterality: Right;  . HOLMIUM LASER APPLICATION Left 1/61/0960   Procedure: HOLMIUM LASER APPLICATION;  Surgeon: Festus Aloe, MD;  Location: [facility];  Service: Urology;  Laterality: Left;  . KNEE ARTHROSCOPY Bilateral 1990's-- right x2 ;  left x1  . POSTERIOR LAMINECTOMY / DECOMPRESSION LUMBAR SPINE  03/24/2002   L2 -- S1  . POSTERIOR LUMBAR FUSION  11/16/2010   L2 -- L4  . SHOULDER OPEN ROTATOR CUFF REPAIR Bilateral lef 11/15/2009/  right 2013 approx  . TONSILLECTOMY  child  . TOTAL KNEE ARTHROPLASTY  right 08-08-2000/  left 01-28-2003  . TRANSURETHRAL RESECTION OF PROSTATE Left 12/24/2016   Procedure: TRANSURETHRAL RESECTION OF THE PROSTATE (TURP);  Surgeon: Festus Aloe, MD;  Location: [facility];  Service: Urology;  Laterality: Left;   Social History Social History   Tobacco Use  . Smoking status: Former Smoker    Years: 20.00    Types: Cigarettes    Last attempt to quit: 10/01/1974    Years since quitting: 43.4   . Smokeless tobacco: Never Used  Substance Use Topics  . Alcohol use: No    Alcohol/week: 0.0 oz  . Drug use: No   Family History Family History  Problem Relation Age of Onset  . Hyperlipidemia Mother   . Hypertension Mother   . Hyperlipidemia Father   . Hypertension Father   . Heart disease Father   . Heart attack Father        died age 93  . Cancer Sister   . Hyperlipidemia Brother   . Hypertension Daughter    Current Outpatient Medications on File Prior to Visit  Medication Sig Dispense Refill  . allopurinol (ZYLOPRIM) 300 MG tablet Take 300 mg by mouth every morning.     Marland Kitchen ALPRAZolam (XANAX) 0.5 MG tablet Take 0.25 mg by mouth at bedtime.    Marland Kitchen atorvastatin (LIPITOR) 40 MG tablet Take 1 tablet by mouth at bedtime.     . carbidopa-levodopa (SINEMET IR) 25-100 MG tablet Take 1 tablet 3 (three) times daily by mouth. 270 tablet 1  . escitalopram (LEXAPRO) 10 MG tablet Take 10 mg by mouth every morning.   1  . finasteride (PROSCAR) 5 MG tablet Take 5 mg daily by mouth.  11  . fluticasone (FLONASE) 50 MCG/ACT nasal spray 1 spray at bedtime.  3  . levothyroxine (SYNTHROID, LEVOTHROID) 25 MCG tablet Take 25 mcg by mouth daily before breakfast.   6  . nebivolol (BYSTOLIC) 10 MG tablet Take 5 mg by mouth every morning.     . NUPLAZID 34 MG CAPS TAKE ONE CAPSULE BY MOUTH EVERY DAY 90 capsule 1  . omeprazole (PRILOSEC) 40 MG capsule Take 20 mg daily by mouth.  7  . polyethylene glycol (MIRALAX / GLYCOLAX) packet Take 17 g by mouth daily.    . rivaroxaban (XARELTO) 20 MG TABS tablet Take 1 tablet (20 mg total) by mouth daily with supper. 30 tablet 3  . HYDROcodone-acetaminophen (NORCO/VICODIN) 5-325 MG tablet 2 tablets in the morning  0   No current facility-administered medications on file prior to visit.    Allergies  Allergen Reactions  . Ambien [Zolpidem Tartrate]     Goes wild, gets anxious and confused with ambien     ROS: See HPI for pertinent positives and  negatives.  Physical Examination  Vitals:   02/16/18 0837  BP: 140/78  Pulse: 62  Resp: 18  Temp: 97.7 F (36.5 C)  TempSrc: Oral  SpO2: 97%  Weight: 202 lb (91.6 kg)  Height: 6' (1.829 m)   Body mass index is 27.4 kg/m.  General: A&O x 3, WD male HEENT: No gross abnormalities  Pulmonary: Sym exp, respirations are non labored, good air movt, CTAB, no rales, rhonchi, or wheezing  Cardiac: RRR, Nl S1, S2, no  murmur appreciated  Vascular: Vessel Right Left  Radial 2+Palpable 2+Palpable  Carotid  without bruit  without bruit  Aorta Not palpable N/A  Femoral 1+Palpable 2+Palpable  Popliteal Not palpable Not palpable  PT notPalpable notPalpable  DP 2+Palpable 2+Palpable   Gastrointestinal: soft, NTND, -G/R, - HSM, - palpable masses, - CVAT B. Musculoskeletal: M/S 5/5 throughout, extremities without ischemic changes. Neurologic: Pain and light touch intact in extremities, Motor exam as listed above Skin: No rashes, no ulcers, no cellulitis.   Psychiatric: Normal thought content, mood appropriate for clinical situation.    DATA  EVAR Duplex (Date: 02/16/18)  AAA sac size: 5.0 cm   no endoleak detected Previous: (Date: 08/14/16)  AAA sac size: 4.5 cm x 4.72 cm, normal diameters of iliac arteries  no endoleak detected     Medical Decision Making   Christian Wiley is a 83 y.o. male who presents s/p EVAR (Date: 2001) for a 4.8 cm infrarenal abdominal aortic aneurysm. At the time of his 2014 visit the aneurysm was stable in size at 4.8 cm .  Pt is asymptomatic with a slight increase in sac size to 5.0 cm today, compared to 4.7 cm on 08-14-16. He takes Xarelto for hx of PE.  I discussed with Dr. Trula Slade today's duplex results, pt is asymptomatic, will obtain EVAR duplex in 6 months, pt to follow up with Dr. Scot Dock only.   Thank you for allowing [place] to participate in this patient's care.  Clemon Chambers, RN, MSN, FNP-C [facility]  [place] Office: [facility] Physician: Trula Slade  02/16/2018, 8:57 AM

## 2018-02-23 DIAGNOSIS — N200 Calculus of kidney: Secondary | ICD-10-CM | POA: Diagnosis not present

## 2018-02-23 DIAGNOSIS — N401 Enlarged prostate with lower urinary tract symptoms: Secondary | ICD-10-CM | POA: Diagnosis not present

## 2018-02-23 DIAGNOSIS — R3912 Poor urinary stream: Secondary | ICD-10-CM | POA: Diagnosis not present

## 2018-02-25 ENCOUNTER — Other Ambulatory Visit: Payer: Self-pay

## 2018-02-25 DIAGNOSIS — I714 Abdominal aortic aneurysm, without rupture, unspecified: Secondary | ICD-10-CM

## 2018-03-12 NOTE — Progress Notes (Signed)
Christian Wiley was seen today in the movement disorders clinic for neurologic consultation at the request of [name], MD.  The consultation is for the evaluation of gait change and to r/o PD.  This patient is accompanied in the office by his spouse who supplements the history.   Pt states that he has been seen by his chiropractor, Christian Wiley, for LBP and she recommended he get evaluated by a neurologist as she thought he had parkinsonian features.    09/19/15 update:  Pt returns for f/u accompanied by his wife who supplements the hx.  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  We decided to give him a trial of levodopa.  Today, the patient states that he is only taking it bid instead of the prescribed tid.  He is usually taking it at 10am and 5pm.  Tremor is improved.   He did have an MRI of the brain without gadolinium on 06/28/2015.  There was no significant change compared to his prior MRI of the brain in 2007.  There is evidence of mild small vessel disease.  PT was refused last visit.  He is c/o fatigue.  Has taken his carbidopa/levodopa 25/100 this AM but has not taken his bystolic.  12/21/15 update:  The patient is following up today, accompanied by his wife who supplements the history.  The patient has history of vascular parkinsonism.  Last visit, he was only taking his levodopa twice per day, and I encouraged him to take it 3 times per day as directed.  His wife states that he still doesn't get his midday dose in time.  He was supposed to be taking the medication at 10am (wake up time)/2 pm/6pm.  He has not wanted to attend physical therapy, but continues to exercise at the gym.  He has not had any falls, but does admit that he "slid" out of the bed trying to get out (got new higher mattress).  No hallucinations.  He has had episodes of confusion and will sometimes ask his wife, "who will keep the kids."  He stopped xanax about a week ago and confusion seems  better.  He takes 2 hydrocodone at bedtime for sleep.  That hasn't helped sleep but he is resistant to giving up the medication. No lightheadedness or near syncope.  03/21/16 update:  The patient follows up today, accompanied by his wife who supplements the history.  The patient has a history of vascular parkinsonism.  Last visit, I again encouraged the patient to take his carbidopa/levodopa 3 times per day as opposed to the twice per day that he was previously taking it.  He did set an alarm but is still inconsistent with that.   He did attend physical therapy since our last visit, and I congratulated him on that.  Last visit, we talked about memory loss and I thought that medications contribute, particularly the hydrocodone and I encouraged him to decrease that to 1 tablet per night and add melatonin for sleep.  He did decrease the hydrocodone.  The melatonin didn't help him sleep.  Wife states that memory has been stable but still has trouble with telling the day.  That is not new.  07/23/16 update:  The patient follows up today, accompanied by his wife who supplements the history.  He has a history of vascular parkinsonism and he is supposed to be on carbidopa/levodopa 25/100, one tablet 3 times per day .  He almost always misses the middle  of the day dose of levodopa despite an alarm.  He denies any falls since last visit.  Has dizziness with standing but no near syncope.  Only drinking 8oz of water per day.  No hallucinations.  He does have history of memory loss, and likely vascular dementia.  He has refused any medication for this.  Memory loss is likely exacerbated by medication such as hydrocodone.  He saw [name] since our last visit and had a nocturnal polysomnogram on 06/04/2016.  His apnea-hypopnea index was 8.1.  She did prescribe clonazepam for periodic limb movements, which she felt was consistent with his diagnosis of vascular parkinsonism.  Wife states that Friday pt saw the NP at PCP  office for crying spells and she put him on neudexta.  Only been on it for 3 days.  He is still on lexapro from [name].  Pt cannot state if crying spells associated with sadness but wife thinks that they are associated with sadness.  11/26/16 update:  The patient was up today, limited by his wife who supplements the history.  The patient is supposed to be carbidopa/levodopa 25/100 at 10 AM/2 PM/6 PM, but generally only takes the first and last dose.  He has had one fall while working on [employer].  He was pulling on the crow bar and fell over while pulling on it.  He didn't get hurt.   Some lightheadedness but no syncope.  His nurse practitioner placed him on Nuedexta and his wife states that they d/c that after our last visit.  He is also on Lexapro and wellbutrin was added by the PCP 2 months ago.  His wife states that crying spells improved but she isn't sure If that is when hallucinations started or not.  She came with a long list of issues that she wrote for me.  Pt thought last week that there was a boy under the bed and got a flashlight to look under the bed to look (this happened at about 4-5 am).  He denies other hallucinations, although his wife rates in her note that he will ask her what they're going to feet all the people at her house.  After awakening from a nap, he is asking for granddaughter who lives in [place] and will state that "she was here earlier."   On one occasion, the patient's wife woke up to find the patient looking for a pistol in the closet and when she asked him what he was doing he stated that he heard someone trying to get into the house.  On another occasion he her dog scratching at the door and when she got up and looked, she stated that she knew he had been up earlier because keys were in the door.  He did attend physical/occupational therapy in November.  I have reviewed those records.  He is no longer driving after an incident in which he was supposed to be driving  himself to therapy and then would go to his primary care physician.  It turns out, that he did go to therapy, but he then went to the dentist and they told him he did not have an appointment.  He never made it to the primary care doctor.  He tried to call his wife but he did not dial the area code, so the call did not go through.  He ultimately ended up at his house several hours later.  Last labs 2 weeks ago due to blood in urine.  Saw urology and has kidney stone.    02/27/17 update:  Patient follows up today with his wife who supplements the history.  Last visit, the patient was having more hallucinations, which seemed to perhaps start after Wellbutrin was added.  We held that and his crying spells did not return and hallucinations seems somewhat better, so we decided to keep him off of that.  He is still on Lexapro.  He does state that xanax 0.25 mg was started at night.  Wife states that they had some extra and he was having trouble sleeping so they restarted it.   He is supposed to be on carbidopa/levodopa 25/100 at 10 AM/2 PM/6 PM but often times only takes the first and last dose of that medication.  He often will miss the middle of the day dose.  I did review his medical records since our last visit.  He had lithotripsy for a kidney stone.  He was also admitted to the hospital in February with urosepsis.  He was much more confused during this period of time.  Wife states that it cleared up some when he came home but not quite back to baseline.  He does keep asking where his granddaughter is.  He is asking who will be with his dog when they leave.  He will ask "what are we going to give these people for dinner" but then when asked if he sees people, he denies it or says he saw someone leaving out the door.  He is doing better with walking.  No falls.  He completed PT last week.    06/30/17 update: Patient seen today in follow-up, accompanied by his wife who supplements the history.  Patient is supposed to  be on carbidopa/levodopa 25/100 3 times per day, and wife states that he is actually doing much better at taking it tid as they are setting a phone alarm.   Last visit, we decided to start nuplazid because of hallucinations.  This is helping him.  He takes 2 tablets at dinner and he is not asking wife nearly as much "who is at the house."  Today he did ask her "if the kids are covered up" but that is pretty rare.  He is actually back to doing projects in the house (painting doors).    10/13/17 update: Patient is seen today in follow-up for parkinsonism.  He is accompanied by his wife who supplements the history.  Patient is on carbidopa/levodopa 25/100, 1 tablet 3 times per day. Wife states that they are still having trouble getting in the last pill of the day even though they have an alarm set.  Wife worries about fact that alarm may ring when they are ready to eat protein.   He remains on Nuplazid for his hallucinations, 34 mg daily.  Pt states that he is doing good.   Wife states that he does ask about "what are we going to give the kids for breakfast" and "are we going to take the kids with [place]."  She doesn't think that he sees the kids, but he asks about kids all the time.  They are not scary.  He tells me today "I saw them earlier today but not now."   Wife states that he has a "fear" of "high places" like their porch, which doesn't have a rail.  He has not had falls.  Uses cane at all times.  He has started reading again for leisure.  He helped to paint his daughters  house.  Still has chronic LBP but helps with aleve.  03/13/18 update: Patient is seen today in follow-up for vascular parkinsonism.  This patient is accompanied in the office by his spouse who supplements the history. He is on carbidopa/levodopa 25/100, 1 tablet 3 times per day.  He is also on Nuplazid for hallucinations.  He will ask his wife near daily about the "kids."  He will ask if his wife is going to feed the "kids."  Usually tells his  wife that he doesn't see the kids but he thought that they were there.  He has had no falls.  He has had no lightheadedness or near syncope.  Records have been reviewed since our last visit, including his updated lab work from November.  Patient saw the vascular surgery nurse practitioner since our last visit for abdominal aortic aneurysm, which slightly increased in size from 4.8cm to 5.0 cm.  He will follow-up with them in 6 months.  Wife c/o weight loss due to poor appetite and asks if due to nuplazid    ALLERGIES:   Allergies  Allergen Reactions  . Ambien [Zolpidem Tartrate]     Goes wild, gets anxious and confused with ambien    CURRENT MEDICATIONS:  Outpatient Encounter Medications as of 03/13/2018  Medication Sig  . allopurinol (ZYLOPRIM) 300 MG tablet Take 300 mg by mouth every morning.   Marland Kitchen ALPRAZolam (XANAX) 0.5 MG tablet Take 0.25 mg by mouth at bedtime.  Marland Kitchen atorvastatin (LIPITOR) 40 MG tablet Take 1 tablet by mouth at bedtime.   . carbidopa-levodopa (SINEMET IR) 25-100 MG tablet Take 1 tablet 3 (three) times daily by mouth.  . escitalopram (LEXAPRO) 10 MG tablet Take 10 mg by mouth every morning.   . finasteride (PROSCAR) 5 MG tablet Take 5 mg daily by mouth.  . fluticasone (FLONASE) 50 MCG/ACT nasal spray 1 spray at bedtime.  Marland Kitchen levothyroxine (SYNTHROID, LEVOTHROID) 25 MCG tablet Take 25 mcg by mouth daily before breakfast.   . nebivolol (BYSTOLIC) 10 MG tablet Take 5 mg by mouth every morning.   . NUPLAZID 34 MG CAPS TAKE ONE CAPSULE BY MOUTH EVERY DAY  . omeprazole (PRILOSEC) 40 MG capsule Take 20 mg daily by mouth.  . polyethylene glycol (MIRALAX / GLYCOLAX) packet Take 17 g by mouth daily.  . rivaroxaban (XARELTO) 20 MG TABS tablet Take 1 tablet (20 mg total) by mouth daily with supper.  . [DISCONTINUED] HYDROcodone-acetaminophen (NORCO/VICODIN) 5-325 MG tablet 2 tablets in the morning   No facility-administered encounter medications on file as of 03/13/2018.     PAST  MEDICAL HISTORY:   Past Medical History:  Diagnosis Date  . Anticoagulant long-term use    xarelto-- due to hx PE's   . Chronic constipation   . Chronic low back pain   . DDD (degenerative disc disease), lumbar   . GERD (gastroesophageal reflux disease)   . H/O endovascular stent graft for abdominal aortic aneurysm followed by dr Scot Dock   04/ 2001  s/p infarenal AAA w/ AneuRx graft stent:  last duplex 08-14-2016 repair patent, AAA sac size 4.5cm x 4.72cm (stable), no endoleak detected  . Hiatal hernia   . History of adenomatous polyp of colon    2010-- tubular adenoma and hyperplastic  . History of basal cell carcinoma excision    yrs ago  . History of BPH   . History of gout    per pt's wife gout stable as of 12-19-2016  . History of pulmonary embolus (PE)  04/ 2011 and 10/ 2014  . History of skin cancer   . Hyperlipidemia   . Hypertension   . Hypothyroidism   . Left ureteral stone   . Mild sleep apnea    per study 06-04-2016 very mild sleep apnea not obstructive , cpap not recommended  . OA (osteoarthritis)   . RBBB (right bundle branch block)   . Vascular dementia   . Vascular parkinsonism [facility])    neurologist-  dr tat  . Wears hearing aid    bilateral   . Wears partial dentures    upper    PAST SURGICAL HISTORY:   Past Surgical History:  Procedure Laterality Date  . ABDOMINAL AORTIC ENDOVASCULAR STENT GRAFT  04/ 2001   dr Scot Dock   AneuRx graft stent  . CATARACT EXTRACTION W/ INTRAOCULAR LENS  IMPLANT, BILATERAL  2001 approx.  . CHOLECYSTECTOMY OPEN  1980's  . COLONOSCOPY  last one 01/ 2010  . CYSTOSCOPY/RETROGRADE/URETEROSCOPY/STONE EXTRACTION WITH BASKET Left 12/24/2016   Procedure: CYSTOSCOPY/RETROGRADE/URETEROSCOPY/STONE EXTRACTION WITH BASKET HOLMIUM LASER STENT PLACEMENT;  Surgeon: Festus Aloe, MD;  Location: [facility];  Service: Urology;  Laterality: Left;  . EXCISION/RELEASE BURSA HIP Right 07/15/2014   Procedure: RIGHT HIP  BURSECTOMY WITH TENDON REPAIR ;  Surgeon: Gearlean Alf, MD;  Location: [facility];  Service: Orthopedics;  Laterality: Right;  . HOLMIUM LASER APPLICATION Left 0/86/5784   Procedure: HOLMIUM LASER APPLICATION;  Surgeon: Festus Aloe, MD;  Location: [facility];  Service: Urology;  Laterality: Left;  . KNEE ARTHROSCOPY Bilateral 1990's-- right x2 ;  left x1  . POSTERIOR LAMINECTOMY / DECOMPRESSION LUMBAR SPINE  03/24/2002   L2 -- S1  . POSTERIOR LUMBAR FUSION  11/16/2010   L2 -- L4  . SHOULDER OPEN ROTATOR CUFF REPAIR Bilateral lef 11/15/2009/  right 2013 approx  . TONSILLECTOMY  child  . TOTAL KNEE ARTHROPLASTY  right 08-08-2000/  left 01-28-2003  . TRANSURETHRAL RESECTION OF PROSTATE Left 12/24/2016   Procedure: TRANSURETHRAL RESECTION OF THE PROSTATE (TURP);  Surgeon: Festus Aloe, MD;  Location: [facility];  Service: Urology;  Laterality: Left;    SOCIAL HISTORY:   Social History   Socioeconomic History  . Marital status: Married    Spouse name: Not on file  . Number of children: 3  . Years of education: Not on file  . Highest education level: Not on file  Occupational History  . 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 Financial resource strain: Not on file  . Food insecurity:    Worry: Not on file    Inability: Not on file  . Transportation needs:    Medical: Not on file    Non-medical: Not on file  Tobacco Use  . Smoking status: Former Smoker    Years: 20.00    Types: Cigarettes    Last attempt to quit: 10/01/1974    Years since quitting: 43.4  . Smokeless tobacco: Never Used  Substance and Sexual Activity  . Alcohol use: No    Alcohol/week: 0.0 oz  . Drug use: No  . Sexual activity: Not on file  Lifestyle  . Physical activity:    Days per week: Not on file    Minutes per session: Not on file  . Stress: Not on file  Relationships  . Social connections:    Talks on phone: Not on file     Gets together: Not on file    Attends  religious service: Not on file    Active member of club or organization: Not on file    Attends meetings of clubs or organizations: Not on file    Relationship status: Not on file  . Intimate partner violence:    Fear of current or ex partner: Not on file    Emotionally abused: Not on file    Physically abused: Not on file    Forced sexual activity: Not on file  Other Topics Concern  . Not on file  Social History Narrative   Married   2 daughters, 1 son    FAMILY HISTORY:   Family Status  Relation Name Status  . Mother  Deceased       alzheimer dementia  . Father  Deceased       CAD  . Brother  Alive       healthy  . Sister  Deceased       breast CA  . Sister  Alive       DJD  . Child  Alive       3, healthy  . Sister  (Not Specified)  . Brother  (Not Specified)  . Daughter  (Not Specified)    ROS:  A complete 10 system review of systems was obtained and was unremarkable apart from what is mentioned above.  PHYSICAL EXAMINATION:    VITALS:   Vitals:   03/13/18 1033  BP: 118/70  Pulse: 60  SpO2: 97%  Weight: 203 lb (92.1 kg)  Height: 6' (1.829 m)   Wt Readings from Last 3 Encounters:  03/13/18 203 lb (92.1 kg)  02/16/18 202 lb (91.6 kg)  10/13/17 217 lb (98.4 kg)     GEN:  The patient appears stated age and is in NAD. HEENT:  Normocephalic, atraumatic.  The mucous membranes are moist. The superficial temporal arteries are without ropiness or tenderness. CV:  Bradycardic.  Regular rhythm Lungs:  CTAB Neck/HEME:  There are no carotid bruits bilaterally.  Neurological examination:  Orientation: He is alert and oriented x2.  He does remember specific details about the examiner and asks several questions that are appropriate. Montreal Cognitive Assessment  03/21/2016  Visuospatial/ Executive (0/5) 1  Naming (0/3) 2  Attention: Read list of digits (0/2) 2  Attention: Read list of letters (0/1) 1  Attention: Serial 7  subtraction starting at 100 (0/3) 1  Language: Repeat phrase (0/2) 1  Language : Fluency (0/1) 1  Abstraction (0/2) 1  Delayed Recall (0/5) 1  Orientation (0/6) 3  Total 14  Adjusted Score (based on education) 15    Cranial nerves: There is good facial symmetry. The speech is fluent and clear. Soft palate rises symmetrically and there is no tongue deviation. Hearing is intact to conversational tone. Sensation: Sensation is intact to light touch throughout Motor: Strength is 5/5 in the bilateral upper and lower extremities.   Shoulder shrug is equal and symmetric.  There is no pronator drift.   Movement examination: Tone: There is normal tone in the bilateral upper extremities.  The tone in the lower extremities is normal.  Abnormal movements: There is rare resting tremor on the right Coordination:  There is slowing with alternation of supination/pronation of the forearm on the L.   Gait and Station: The patient ambulates with his cane.  He is just mildly unsteady.  labs:  Lab work received from primary care physician dated October 20, 2017.  Sodium was 141, potassium 4.1, chloride 104, CO2 29, BUN  15 and creatinine 0.9.  White blood cells were 6.8, hemoglobin 13.8, hematocrit 42.0 and platelets 146.  Total cholesterol is 134, HDL 38, LDL 81, TSH 1.26  ASSESSMENT/PLAN:  1.  Parkinsonism  -I suspect that this represents vascular parkinsonism.   Continue carbidopa/levodopa 25/100, 1 tablet 3 times per day.  -Having some more difficulty with balance per wife.  Recommended [employer].  Given information on this.  Also given information on scholarship programs that are available.  Home safety addressed 2.  Dementia with hallucinations  -MoCA 15 on 03/21/16.  Don't do MoCA any longer   -on nuplazid and helped but still having some hallucinations.  Talked about other medications, but ultimately decided to stand Nuplazid for now. 3.  Periodic limb movement disorder and very mild sleep  apnea  -The patient saw [name] and had a sleep study on 06/04/2016.  AHI was only 8.1 and CPAP was not recommended.  The patient should sleep in the side-lying position.  She did recommend low-dose clonazepam at night. 4.  LBP  -The patient has seen Dr. Carloyn Manner and Dr. Nelva Bush along with chiropractics.  While I did see he had an MRI of his lumbar spine in 2014 that demonstrated moderate to severe neural foraminal stenosis at L4-L5 and significant degenerative changes, they are not interested in further surgery or injections and have failed alternative (chiropractic) tx. He is taking tylenol for pain 5.  Depression  -on Lexapro, 10 mg daily  -Off Wellbutrin, which increased hallucinations. 6.  Weight loss  -about 30# in the last year.  Told to f/u with PCP.  Until then, drink protein shake/ensure/boost bid (currently on q day).  Wife reports they have an appointment with primary care physician in the next few weeks. 7. AAA  -wife had questions about this and told them to direct to vascular sx 8.  Follow up is anticipated in the next 6 months, sooner should new neurologic issues arise.  Much greater than 50% of this visit was spent in counseling and coordinating care.  Total face to face time:  25 min

## 2018-03-13 ENCOUNTER — Encounter: Payer: Self-pay | Admitting: Neurology

## 2018-03-13 ENCOUNTER — Ambulatory Visit: Payer: PPO | Admitting: Neurology

## 2018-03-13 VITALS — BP 118/70 | HR 60 | Ht 72.0 in | Wt 203.0 lb

## 2018-03-13 DIAGNOSIS — R634 Abnormal weight loss: Secondary | ICD-10-CM

## 2018-03-13 DIAGNOSIS — F0150 Vascular dementia without behavioral disturbance: Secondary | ICD-10-CM

## 2018-03-13 DIAGNOSIS — G214 Vascular parkinsonism: Secondary | ICD-10-CM

## 2018-03-13 DIAGNOSIS — R441 Visual hallucinations: Secondary | ICD-10-CM | POA: Diagnosis not present

## 2018-03-13 MED ORDER — PIMAVANSERIN TARTRATE 34 MG PO CAPS: 1.0000 | ORAL_CAPSULE | Freq: Every day | ORAL | 1 refills | Status: DC

## 2018-04-02 ENCOUNTER — Ambulatory Visit: Payer: PPO | Admitting: Podiatry

## 2018-04-02 ENCOUNTER — Encounter: Payer: Self-pay | Admitting: Podiatry

## 2018-04-02 VITALS — BP 121/59 | HR 60 | Resp 16

## 2018-04-02 DIAGNOSIS — D689 Coagulation defect, unspecified: Secondary | ICD-10-CM

## 2018-04-02 DIAGNOSIS — B351 Tinea unguium: Secondary | ICD-10-CM | POA: Diagnosis not present

## 2018-04-02 DIAGNOSIS — M79675 Pain in left toe(s): Secondary | ICD-10-CM | POA: Diagnosis not present

## 2018-04-02 DIAGNOSIS — M79674 Pain in right toe(s): Secondary | ICD-10-CM

## 2018-04-02 NOTE — Progress Notes (Signed)
   Subjective:    Patient ID: Christian Wiley, male    DOB: [birth year], 83 y.o.   MRN: [id number]  HPI    Review of Systems  All other systems reviewed and are negative.      Objective:   Physical Exam        Assessment & Plan:

## 2018-04-02 NOTE — Progress Notes (Signed)
Subjective:   Patient ID: Christian Wiley, male   DOB: 83 y.o.   MRN: [id number]   HPI Patient presents with significant nail disease 1-5 both feet that are very thick and they cannot cut with patient on blood thinners and history of bleeding.  Patient does not smoke currently and is active   Review of Systems  All other systems reviewed and are negative.       Objective:  Physical Exam  Constitutional: He appears well-developed and well-nourished.  Cardiovascular: Intact distal pulses.  Pulmonary/Chest: Effort normal.  Musculoskeletal: Normal range of motion.  Neurological: He is alert.  Skin: Skin is warm.  Nursing note and vitals reviewed.   Neurovascular status found to be mildly diminished but intact with thick yellow brittle nailbeds 1-5 both feet that are irritating the corners do become painful as the elongation with inability for them to contact with history of pulmonary embolism on blood     Assessment:  At risk patient with thick yellow brittle nailbeds 1-5 both feet that are moderately painful and at risk     Plan:  H&P performed condition discussed and debridement of nail bed 1-5 bilateral with no iatrogenic bleeding accomplished.  Reappoint for routine care

## 2018-04-16 DIAGNOSIS — Z961 Presence of intraocular lens: Secondary | ICD-10-CM | POA: Diagnosis not present

## 2018-04-16 DIAGNOSIS — H04223 Epiphora due to insufficient drainage, bilateral lacrimal glands: Secondary | ICD-10-CM | POA: Diagnosis not present

## 2018-04-16 DIAGNOSIS — H40051 Ocular hypertension, right eye: Secondary | ICD-10-CM | POA: Diagnosis not present

## 2018-04-16 DIAGNOSIS — H524 Presbyopia: Secondary | ICD-10-CM | POA: Diagnosis not present

## 2018-04-20 DIAGNOSIS — D1801 Hemangioma of skin and subcutaneous tissue: Secondary | ICD-10-CM | POA: Diagnosis not present

## 2018-04-20 DIAGNOSIS — L814 Other melanin hyperpigmentation: Secondary | ICD-10-CM | POA: Diagnosis not present

## 2018-04-20 DIAGNOSIS — L821 Other seborrheic keratosis: Secondary | ICD-10-CM | POA: Diagnosis not present

## 2018-04-20 DIAGNOSIS — L570 Actinic keratosis: Secondary | ICD-10-CM | POA: Diagnosis not present

## 2018-05-06 DIAGNOSIS — I2699 Other pulmonary embolism without acute cor pulmonale: Secondary | ICD-10-CM | POA: Diagnosis not present

## 2018-05-06 DIAGNOSIS — E7849 Other hyperlipidemia: Secondary | ICD-10-CM | POA: Diagnosis not present

## 2018-05-06 DIAGNOSIS — I10 Essential (primary) hypertension: Secondary | ICD-10-CM | POA: Diagnosis not present

## 2018-05-06 DIAGNOSIS — M1990 Unspecified osteoarthritis, unspecified site: Secondary | ICD-10-CM | POA: Diagnosis not present

## 2018-05-06 DIAGNOSIS — Z1389 Encounter for screening for other disorder: Secondary | ICD-10-CM | POA: Diagnosis not present

## 2018-05-06 DIAGNOSIS — R630 Anorexia: Secondary | ICD-10-CM | POA: Diagnosis not present

## 2018-05-06 DIAGNOSIS — M4726 Other spondylosis with radiculopathy, lumbar region: Secondary | ICD-10-CM | POA: Diagnosis not present

## 2018-05-06 DIAGNOSIS — M1A9XX0 Chronic gout, unspecified, without tophus (tophi): Secondary | ICD-10-CM | POA: Diagnosis not present

## 2018-05-06 DIAGNOSIS — G629 Polyneuropathy, unspecified: Secondary | ICD-10-CM | POA: Diagnosis not present

## 2018-05-06 DIAGNOSIS — I951 Orthostatic hypotension: Secondary | ICD-10-CM | POA: Diagnosis not present

## 2018-05-06 DIAGNOSIS — R7302 Impaired glucose tolerance (oral): Secondary | ICD-10-CM | POA: Diagnosis not present

## 2018-05-06 DIAGNOSIS — K5909 Other constipation: Secondary | ICD-10-CM | POA: Diagnosis not present

## 2018-05-07 DIAGNOSIS — R7302 Impaired glucose tolerance (oral): Secondary | ICD-10-CM | POA: Diagnosis not present

## 2018-06-04 ENCOUNTER — Other Ambulatory Visit: Payer: Self-pay | Admitting: Internal Medicine

## 2018-06-04 ENCOUNTER — Ambulatory Visit
Admission: RE | Admit: 2018-06-04 | Discharge: 2018-06-04 | Disposition: A | Discharge status: Home / Self Care | Payer: PPO | Source: Ambulatory Visit | Attending: Internal Medicine | Admitting: Internal Medicine

## 2018-06-04 DIAGNOSIS — N200 Calculus of kidney: Secondary | ICD-10-CM | POA: Diagnosis not present

## 2018-06-04 DIAGNOSIS — R1013 Epigastric pain: Secondary | ICD-10-CM

## 2018-06-04 DIAGNOSIS — R634 Abnormal weight loss: Secondary | ICD-10-CM | POA: Diagnosis not present

## 2018-06-04 DIAGNOSIS — Z6826 Body mass index (BMI) 26.0-26.9, adult: Secondary | ICD-10-CM | POA: Diagnosis not present

## 2018-06-04 MED ORDER — IOPAMIDOL (ISOVUE-300) INJECTION 61%
125.0000 mL | Freq: Once | INTRAVENOUS | Status: AC | PRN
  Administered 2018-06-04: 125 mL via INTRAVENOUS

## 2018-06-05 ENCOUNTER — Other Ambulatory Visit: Payer: Self-pay | Admitting: *Deleted

## 2018-06-12 ENCOUNTER — Encounter (HOSPITAL_COMMUNITY)
Admission: RE | Disposition: A | Discharge status: Home / Self Care | Payer: Self-pay | Source: Ambulatory Visit | Attending: Vascular Surgery

## 2018-06-12 ENCOUNTER — Ambulatory Visit (HOSPITAL_COMMUNITY)
Admission: RE | Admit: 2018-06-12 | Discharge: 2018-06-12 | Disposition: A | Discharge status: Home / Self Care | Payer: PPO | Source: Ambulatory Visit | Attending: Vascular Surgery | Admitting: Vascular Surgery

## 2018-06-12 ENCOUNTER — Encounter (HOSPITAL_COMMUNITY): Payer: Self-pay | Admitting: Vascular Surgery

## 2018-06-12 DIAGNOSIS — E039 Hypothyroidism, unspecified: Secondary | ICD-10-CM | POA: Diagnosis not present

## 2018-06-12 DIAGNOSIS — G8929 Other chronic pain: Secondary | ICD-10-CM | POA: Insufficient documentation

## 2018-06-12 DIAGNOSIS — K219 Gastro-esophageal reflux disease without esophagitis: Secondary | ICD-10-CM | POA: Insufficient documentation

## 2018-06-12 DIAGNOSIS — G20 Parkinson's disease: Secondary | ICD-10-CM | POA: Diagnosis not present

## 2018-06-12 DIAGNOSIS — E785 Hyperlipidemia, unspecified: Secondary | ICD-10-CM | POA: Diagnosis not present

## 2018-06-12 DIAGNOSIS — M5136 Other intervertebral disc degeneration, lumbar region: Secondary | ICD-10-CM | POA: Diagnosis not present

## 2018-06-12 DIAGNOSIS — Z8249 Family history of ischemic heart disease and other diseases of the circulatory system: Secondary | ICD-10-CM | POA: Insufficient documentation

## 2018-06-12 DIAGNOSIS — Z48812 Encounter for surgical aftercare following surgery on the circulatory system: Secondary | ICD-10-CM | POA: Diagnosis not present

## 2018-06-12 DIAGNOSIS — Z8774 Personal history of (corrected) congenital malformations of heart and circulatory system: Secondary | ICD-10-CM | POA: Diagnosis not present

## 2018-06-12 DIAGNOSIS — G4730 Sleep apnea, unspecified: Secondary | ICD-10-CM | POA: Diagnosis not present

## 2018-06-12 DIAGNOSIS — Z7901 Long term (current) use of anticoagulants: Secondary | ICD-10-CM | POA: Diagnosis not present

## 2018-06-12 DIAGNOSIS — M109 Gout, unspecified: Secondary | ICD-10-CM | POA: Diagnosis not present

## 2018-06-12 DIAGNOSIS — I10 Essential (primary) hypertension: Secondary | ICD-10-CM | POA: Diagnosis not present

## 2018-06-12 DIAGNOSIS — Z87891 Personal history of nicotine dependence: Secondary | ICD-10-CM | POA: Diagnosis not present

## 2018-06-12 DIAGNOSIS — K5909 Other constipation: Secondary | ICD-10-CM | POA: Insufficient documentation

## 2018-06-12 DIAGNOSIS — I4510 Unspecified right bundle-branch block: Secondary | ICD-10-CM | POA: Diagnosis not present

## 2018-06-12 DIAGNOSIS — Z86711 Personal history of pulmonary embolism: Secondary | ICD-10-CM | POA: Diagnosis not present

## 2018-06-12 DIAGNOSIS — M1990 Unspecified osteoarthritis, unspecified site: Secondary | ICD-10-CM | POA: Diagnosis not present

## 2018-06-12 DIAGNOSIS — I714 Abdominal aortic aneurysm, without rupture: Secondary | ICD-10-CM | POA: Diagnosis not present

## 2018-06-12 HISTORY — PX: LOWER EXTREMITY ANGIOGRAM: SHX5508

## 2018-06-12 HISTORY — PX: ABDOMINAL AORTOGRAM: CATH118222

## 2018-06-12 LAB — POCT I-STAT, CHEM 8
BUN: 18 mg/dL (ref 8–23)
CALCIUM ION: 1.23 mmol/L (ref 1.15–1.40)
CREATININE: 1.1 mg/dL (ref 0.61–1.24)
Chloride: 100 mmol/L (ref 98–111)
GLUCOSE: 116 mg/dL — AB (ref 70–99)
HCT: 38 % — ABNORMAL LOW (ref 39.0–52.0)
Hemoglobin: 12.9 g/dL — ABNORMAL LOW (ref 13.0–17.0)
Potassium: 3.5 mmol/L (ref 3.5–5.1)
SODIUM: 142 mmol/L (ref 135–145)
TCO2: 26 mmol/L (ref 22–32)

## 2018-06-12 SURGERY — ABDOMINAL AORTOGRAM
Anesthesia: LOCAL

## 2018-06-12 MED ORDER — SODIUM CHLORIDE 0.9 % IV SOLN
INTRAVENOUS | Status: DC
  Administered 2018-06-12: 06:00:00 via INTRAVENOUS

## 2018-06-12 MED ORDER — HYDRALAZINE HCL 20 MG/ML IJ SOLN
INTRAMUSCULAR | Status: DC | PRN
  Administered 2018-06-12 (×2): 10 mg via INTRAVENOUS

## 2018-06-12 MED ORDER — SODIUM CHLORIDE 0.9 % IV SOLN: 250.0000 mL | INTRAVENOUS | Status: DC | PRN

## 2018-06-12 MED ORDER — HYDRALAZINE HCL 20 MG/ML IJ SOLN
INTRAMUSCULAR | Status: AC
  Filled 2018-06-12: qty 1

## 2018-06-12 MED ORDER — HEPARIN (PORCINE) IN NACL 1000-0.9 UT/500ML-% IV SOLN
INTRAVENOUS | Status: AC
  Filled 2018-06-12: qty 1000

## 2018-06-12 MED ORDER — SODIUM CHLORIDE 0.9% FLUSH: 3.0000 mL | Freq: Two times a day (BID) | INTRAVENOUS | Status: DC

## 2018-06-12 MED ORDER — SODIUM CHLORIDE 0.9% FLUSH: 3.0000 mL | INTRAVENOUS | Status: DC | PRN

## 2018-06-12 MED ORDER — SODIUM CHLORIDE 0.9 % WEIGHT BASED INFUSION: 1.0000 mL/kg/h | INTRAVENOUS | Status: DC

## 2018-06-12 MED ORDER — HEPARIN (PORCINE) IN NACL 2-0.9 UNITS/ML
INTRAMUSCULAR | Status: AC | PRN
  Administered 2018-06-12 (×2): 500 mL via INTRA_ARTERIAL

## 2018-06-12 MED ORDER — ONDANSETRON HCL 4 MG/2ML IJ SOLN: 4.0000 mg | Freq: Four times a day (QID) | INTRAMUSCULAR | Status: DC | PRN

## 2018-06-12 MED ORDER — LIDOCAINE HCL (PF) 1 % IJ SOLN
INTRAMUSCULAR | Status: DC | PRN
  Administered 2018-06-12: 15 mL

## 2018-06-12 MED ORDER — LABETALOL HCL 5 MG/ML IV SOLN: 10.0000 mg | INTRAVENOUS | Status: DC | PRN

## 2018-06-12 MED ORDER — HYDRALAZINE HCL 20 MG/ML IJ SOLN: 5.0000 mg | INTRAMUSCULAR | Status: DC | PRN

## 2018-06-12 MED ORDER — LIDOCAINE HCL (PF) 1 % IJ SOLN
INTRAMUSCULAR | Status: AC
  Filled 2018-06-12: qty 30

## 2018-06-12 MED ORDER — IODIXANOL 320 MG/ML IV SOLN
INTRAVENOUS | Status: DC | PRN
  Administered 2018-06-12: 95 mL via INTRA_ARTERIAL

## 2018-06-12 MED ORDER — ACETAMINOPHEN 325 MG PO TABS: 650.0000 mg | ORAL_TABLET | ORAL | Status: DC | PRN

## 2018-06-12 SURGICAL SUPPLY — 13 items
CATH ACCU-VU SIZ PIG 5F 70CM (CATHETERS) ×1 IMPLANT
CATH ANGIO 5F BER2 65CM (CATHETERS) IMPLANT
COVER PRB 48X5XTLSCP FOLD TPE (BAG) IMPLANT
COVER PROBE 5X48 (BAG) ×3
KIT MICROPUNCTURE NIT STIFF (SHEATH) ×1 IMPLANT
KIT PV (KITS) ×3 IMPLANT
SHEATH AVANTI 5FR 23CM (SHEATH) ×2 IMPLANT
SHEATH PINNACLE 5F 10CM (SHEATH) ×1 IMPLANT
SYRINGE MEDRAD AVANTA MACH 7 (SYRINGE) ×1 IMPLANT
TRANSDUCER W/STOPCOCK (MISCELLANEOUS) ×3 IMPLANT
TRAY PV CATH (CUSTOM PROCEDURE TRAY) ×3 IMPLANT
TUBING CIL FLEX 10 FLL-RA (TUBING) ×1 IMPLANT
WIRE HITORQ VERSACORE ST 145CM (WIRE) ×2 IMPLANT

## 2018-06-12 NOTE — Progress Notes (Signed)
Site area: bilateral fa sheaths; left fa sheath pulled and pressure held by [facility] Prior to Removal:  Level 0 Pressure Applied For: 20 minutes Manual:   yes Patient Status During Pull:  stable Post Pull Site:  Level 0 Post Pull Instructions Given:  yes Post Pull Pulses Present: palpable bilaterally Dressing Applied:  Gauze and tegaderm Bedrest begins @ 0930 Comments:

## 2018-06-12 NOTE — Discharge Instructions (Signed)

## 2018-06-12 NOTE — Op Note (Signed)
   PATIENT: Christian Wiley      MRN: [id number] DOB: 02-24-1935    DATE OF PROCEDURE: 06/12/2018  INDICATIONS:    Christian Wiley is a 83 y.o. male who underwent endovascular repair of an abdominal aortic aneurysm in 2001 with an AneuRx graft.  The aneurysm had been stable in size until a CT scan in June showed that the aneurysm had enlarged from 5.0 to 5.4 cm with evidence of possibly a type Ib endoleak.  He presents for arteriography in order to try to identify the source of the leak.  PROCEDURE:    1.  Ultrasound-guided access to bilateral common femoral arteries 2.  Aortogram with bilateral iliac arteriogram 3.  Bilateral retrograde femoral arteriograms.   SURGEON: Judeth Cornfield. Scot Dock, MD, FACS  ANESTHESIA: Local  EBL: Minimal  TECHNIQUE: The patient was taken to the peripheral vascular lab.  I elected not to sedate him as he had some mild underlying dementia. Both groins were prepped and draped in the usual sterile fashion.  Under ultrasound guidance, after the skin was anesthetized, I cannulated the right common femoral artery with a micropuncture needle and a micropuncture sheath was introduced over a wire.  This was exchanged for a 5 [place] sheath over a Bentson wire.  By ultrasound the femoral artery was patent. A real-time image was obtained and placed in the chart.  Again on the left side after the skin was anesthetized I cannulated the left common femoral artery with a micropuncture needle under ultrasound guidance and a micropuncture sheath was introduced over the wire.  I then used the Bentson wire to place a long 5 French sheath of the left side.  The pigtail catheter was positioned above the renal arteries and aortogram was obtained.  I obtained both in AP and lateral projection.  With the pigtail up the left side I shot a film to use the marker pig to help [place] determine the length if he required extension iliac limbs.  I subsequently did a retrograde left iliac arteriogram.    I then did the same thing on the right side.  I exchange the short 5 [place] sheath for a long 5 [place] sheath.  With a marker pig out by shot of film to use for guidance on length.  I then removed the pigtail catheter and did a retrograde right femoral arteriogram to evaluate the iliac.  FINDINGS:   1.  The AneuRx graft appears to be in good position and I do not see any evidence of a type I or type II endoleak.  There appears to be potentially a type II endoleak from a branch from the right internal iliac artery.   2.  There are 2 renal arteries bilaterally with no significant renal artery stenosis identified. 3.  There is significant tortuosity of both common iliac arteries.  CLINICAL NOTE: I do not see evidence of a type I endoleak. He appears to have a type II endoleak from a branch of the internal iliac artery on the right.  This reason I will refer him to Dr. Wallene Dales to consider coil embolization of this type II endoleak.  Deitra Mayo, MD, FACS [facility]  DATE OF DICTATION:   06/12/2018

## 2018-06-12 NOTE — H&P (Signed)
Patient name: Christian Wiley MRN: [id number] DOB: July 14, 1935 Sex: male   REASON FOR ANGIO:    Type Ib endoleak.  Status post endovascular aneurysm repair in 2001.  HPI:   Christian Wiley is a pleasant 83 y.o. male, who underwent endovascular repair of an abdominal aortic aneurysm in 2001 with an AneurX graft.  I am unable to retrieve his records in epic and will have the office try to obtain his operative report.   We have been following him in the office with routine surveillance.  I last saw him on 02/08/2015.  At that time, the aneurysm was 4.8 cm in maximum diameter which was unchanged compared to the size when he underwent repair in 2001.  He was subsequently seen by our nurse practitioner on 02/16/2018.  At that time the aneurysm had increased slightly in size to 5 cm from 4.7 cm on 08/14/2016.  He was set up for a six-month follow-up visit.  The patient was having epigastric pain which prompted a CT scan which was done on 06/04/2018.  This showed that the aneurysm had enlarged to 5.4 cm with a type Ib endoleak.  The source could not be precisely identified.  He presents for arteriography in order to assess this.  He denies any abdominal pain or back pain.   Past Medical History:  Diagnosis Date  . Anticoagulant long-term use    xarelto-- due to hx PE's   . Chronic constipation   . Chronic low back pain   . DDD (degenerative disc disease), lumbar   . GERD (gastroesophageal reflux disease)   . H/O endovascular stent graft for abdominal aortic aneurysm followed by dr Scot Dock   04/ 2001  s/p infarenal AAA w/ AneuRx graft stent:  last duplex 08-14-2016 repair patent, AAA sac size 4.5cm x 4.72cm (stable), no endoleak detected  . Hiatal hernia   . History of adenomatous polyp of colon    2010-- tubular adenoma and hyperplastic  . History of basal cell carcinoma excision    yrs ago  . History of BPH   . History of gout    per pt's wife gout stable as of 12-19-2016  . History of  pulmonary embolus (PE)    04/ 2011 and 10/ 2014  . History of skin cancer   . Hyperlipidemia   . Hypertension   . Hypothyroidism   . Left ureteral stone   . Mild sleep apnea    per study 06-04-2016 very mild sleep apnea not obstructive , cpap not recommended  . OA (osteoarthritis)   . RBBB (right bundle branch block)   . Vascular dementia   . 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 Wears hearing aid    bilateral   . Wears partial dentures    upper   Family History  Problem Relation Age of Onset  . Hyperlipidemia Mother   . Hypertension Mother   . Hyperlipidemia Father   . Hypertension Father   . Heart disease Father   . Heart attack Father        died age 6  . Cancer Sister   . Hyperlipidemia Brother   . Hypertension Daughter    SOCIAL HISTORY: Social History   Socioeconomic History  . Marital status: Married    Spouse name: Not on file  . Number of children: 3  . Years of education: Not on file  . Highest education level: Not on file  Occupational History  .  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 Financial resource strain: Not on file  . Food insecurity:    Worry: Not on file    Inability: Not on file  . Transportation needs:    Medical: Not on file    Non-medical: Not on file  Tobacco Use  . Smoking status: Former Smoker    Years: 20.00    Types: Cigarettes    Last attempt to quit: 10/01/1974    Years since quitting: 43.7  . Smokeless tobacco: Never Used  Substance and Sexual Activity  . Alcohol use: No    Alcohol/week: 0.0 oz  . Drug use: No  . Sexual activity: Not on file  Lifestyle  . Physical activity:    Days per week: Not on file    Minutes per session: Not on file  . Stress: Not on file  Relationships  . Social connections:    Talks on phone: Not on file    Gets together: Not on file    Attends religious service: Not on file    Active member of club or organization: Not on file     Attends meetings of clubs or organizations: Not on file    Relationship status: Not on file  . Intimate partner violence:    Fear of current or ex partner: Not on file    Emotionally abused: Not on file    Physically abused: Not on file    Forced sexual activity: Not on file  Other Topics Concern  . Not on file  Social History Narrative   Married   2 daughters, 1 son    Allergies  Allergen Reactions  . Ambien [Zolpidem Tartrate]     Goes wild, gets anxious and confused with ambien    Current Facility-Administered Medications  Medication Dose Route Frequency Provider Last Rate Last Dose  . 0.9 %  sodium chloride infusion   Intravenous Continuous Angelia Mould, MD 100 mL/hr at 06/12/18 1610      REVIEW OF SYSTEMS:  [X]  denotes positive finding, [ ]  denotes negative finding Cardiac  Comments:  Chest pain or chest pressure:    Shortness of breath upon exertion:    Short of breath when lying flat:    Irregular heart rhythm:        Vascular    Pain in calf, thigh, or hip brought on by ambulation:    Pain in feet at night that wakes you up from your sleep:     Blood clot in your veins:    Leg swelling:         Pulmonary    Oxygen at home:    Productive cough:     Wheezing:         Neurologic    Sudden weakness in arms or legs:     Sudden numbness in arms or legs:     Sudden onset of difficulty speaking or slurred speech:    Temporary loss of vision in one eye:     Problems with dizziness:         Gastrointestinal    Blood in stool:     Vomited blood:         Genitourinary    Burning when urinating:     Blood in urine:        Psychiatric    Major depression:         Hematologic    Bleeding problems:  Problems with blood clotting too easily:        Skin    Rashes or ulcers:        Constitutional    Fever or chills:     PHYSICAL EXAM:   Vitals:   06/12/18 0554  BP: (!) 153/75  Pulse: (!) 58  Temp: 97.9 F (36.6 C)  TempSrc: Oral  SpO2:  97%  Weight: 193 lb (87.5 kg)  Height: 6' (1.829 m)    GENERAL: The patient is a well-nourished male, in no acute distress. The vital signs are documented above. CARDIAC: There is a regular rate and rhythm.  VASCULAR: No carotid bruits Palpable femoral pulses.  PULMONARY: There is good air exchange bilaterally without wheezing or rales. ABDOMEN: Soft and non-tender with normal pitched bowel sounds.  MUSCULOSKELETAL: There are no major deformities or cyanosis. NEUROLOGIC: No focal weakness or paresthesias are detected. SKIN: There are no ulcers or rashes noted. PSYCHIATRIC: The patient has a normal affect.  DATA:    CT ABDOMEN PELVIS: This shows that the aneurysm had not was enlarged to 5.4 cm.  There is evidence of a type Ib endoleak.  MEDICAL ISSUES:   STATUS POST ENDOVASCULAR REPAIR OF ABDOMINAL AORTIC ANEURYSM: This patient underwent endovascular repair of an abdominal aortic aneurysm in 2001 with an AneuRx graft.  He presents with enlargement of the aneurysm to 5.4 cm and a type Ib endoleak.  He presents for diagnostic arteriography in order to try to identify the source of the leak.  I have discussed the indications of the procedure and the potential complications and he is agreeable to proceed.  He did stop his Xarelto 2 days ago.  Deitra Mayo [facility] [phone]

## 2018-06-18 ENCOUNTER — Other Ambulatory Visit: Payer: Self-pay | Admitting: Vascular Surgery

## 2018-06-18 DIAGNOSIS — T82330A Leakage of aortic (bifurcation) graft (replacement), initial encounter: Secondary | ICD-10-CM

## 2018-06-18 DIAGNOSIS — IMO0002 Reserved for concepts with insufficient information to code with codable children: Secondary | ICD-10-CM

## 2018-06-24 ENCOUNTER — Ambulatory Visit
Admission: RE | Admit: 2018-06-24 | Discharge: 2018-06-24 | Disposition: A | Discharge status: Home / Self Care | Payer: PPO | Source: Ambulatory Visit | Attending: Vascular Surgery | Admitting: Vascular Surgery

## 2018-06-24 ENCOUNTER — Encounter: Payer: Self-pay | Admitting: Radiology

## 2018-06-24 DIAGNOSIS — I714 Abdominal aortic aneurysm, without rupture: Secondary | ICD-10-CM | POA: Diagnosis not present

## 2018-06-24 DIAGNOSIS — T82330A Leakage of aortic (bifurcation) graft (replacement), initial encounter: Secondary | ICD-10-CM

## 2018-06-24 DIAGNOSIS — Z7902 Long term (current) use of antithrombotics/antiplatelets: Secondary | ICD-10-CM | POA: Diagnosis not present

## 2018-06-24 DIAGNOSIS — IMO0002 Reserved for concepts with insufficient information to code with codable children: Secondary | ICD-10-CM

## 2018-06-24 HISTORY — PX: IR RADIOLOGIST EVAL & MGMT: IMG5224

## 2018-06-24 NOTE — Consult Note (Signed)
Chief Complaint: Patient was seen in consultation today for treatment of type II endoleak at the request of Dickson,Christopher S  Referring Physician(s): Dickson,Christopher S  History of Present Illness: Christian Wiley is a 83 y.o. male status post EVAR to treat abdominal aortic aneurysm on 04/01/2000 utilizing the AneuRx system.  The aneurysm sac size was quite stable over many years until recently when duplex ultrasound demonstrated maximal sac diameter of 5 cm on 02/16/2018 which represented an increase from approximately 4.7 cm.  A standard CT of the abdomen was performed with contrast for evaluation of epigastric abdominal pain on 06/04/2018 demonstrating maximal sac diameter of approximately 5.4 cm and evidence of a prominent endoleak in the inferior aspect of the aortic sac.  This led to arteriography performed by Dr. Scot Dock on 06/12/2018 which demonstrated a normally patent endograft without evidence of a type I endoleak.  There appeared to be a type II endoleak supplied by a branch of the right internal iliac artery.  The patient is asymptomatic.  He does not have any abdominal pain or lower externally claudication.  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  He has been on Xarelto for roughly 5 years and has had 2 separate episodes of pulmonary embolism, most recently in 2014.  Past Medical History:  Diagnosis Date  . Anticoagulant long-term use    xarelto-- due to hx PE's   . Chronic constipation   . Chronic low back pain   . DDD (degenerative disc disease), lumbar   . GERD (gastroesophageal reflux disease)   . H/O endovascular stent graft for abdominal aortic aneurysm followed by dr Scot Dock   04/ 2001  s/p infarenal AAA w/ AneuRx graft stent:  last duplex 08-14-2016 repair patent, AAA sac size 4.5cm x 4.72cm (stable), no endoleak detected  . Hiatal hernia   . History of adenomatous polyp of colon    2010-- tubular  adenoma and hyperplastic  . History of basal cell carcinoma excision    yrs ago  . History of BPH   . History of gout    per pt's wife gout stable as of 12-19-2016  . History of pulmonary embolus (PE)    04/ 2011 and 10/ 2014  . History of skin cancer   . Hyperlipidemia   . Hypertension   . Hypothyroidism   . Left ureteral stone   . Mild sleep apnea    per study 06-04-2016 very mild sleep apnea not obstructive , cpap not recommended  . OA (osteoarthritis)   . RBBB (right bundle branch block)   . Vascular dementia   . Vascular parkinsonism [facility])    neurologist-  dr tat  . Wears hearing aid    bilateral   . Wears partial dentures    upper    Past Surgical History:  Procedure Laterality Date  . ABDOMINAL AORTIC ENDOVASCULAR STENT GRAFT  04/ 2001   dr Scot Dock   AneuRx graft stent  . ABDOMINAL AORTOGRAM N/A 06/12/2018   Procedure: ABDOMINAL AORTOGRAM;  Surgeon: Angelia Mould, MD;  Location: [facility];  Service: Cardiovascular;  Laterality: N/A;  . CATARACT EXTRACTION W/ INTRAOCULAR LENS  IMPLANT, BILATERAL  2001 approx.  . CHOLECYSTECTOMY OPEN  1980's  . COLONOSCOPY  last one 01/ 2010  . CYSTOSCOPY/RETROGRADE/URETEROSCOPY/STONE EXTRACTION WITH BASKET Left 12/24/2016   Procedure: CYSTOSCOPY/RETROGRADE/URETEROSCOPY/STONE EXTRACTION WITH BASKET HOLMIUM LASER STENT PLACEMENT;  Surgeon: Festus Aloe, MD;  Location[facility];  Service: Urology;  Laterality: Left;  . EXCISION/RELEASE BURSA HIP Right 07/15/2014   Procedure: RIGHT HIP BURSECTOMY WITH TENDON REPAIR ;  Surgeon: Gearlean Alf, MD;  Location: [facility];  Service: Orthopedics;  Laterality: Right;  . HOLMIUM LASER APPLICATION Left 6/60/6301   Procedure: HOLMIUM LASER APPLICATION;  Surgeon: Festus Aloe, MD;  Location: [facility];  Service: Urology;  Laterality: Left;  . IR RADIOLOGIST EVAL & MGMT  06/24/2018  . KNEE ARTHROSCOPY Bilateral 1990's-- right x2 ;  left x1  . LOWER  EXTREMITY ANGIOGRAM  06/12/2018   Procedure: Lower Extremity Angiogram;  Surgeon: Angelia Mould, MD;  Location: [place] [facility];  Service: Cardiovascular;;  . POSTERIOR LAMINECTOMY / DECOMPRESSION LUMBAR SPINE  03/24/2002   L2 -- S1  . POSTERIOR LUMBAR FUSION  11/16/2010   L2 -- L4  . SHOULDER OPEN ROTATOR CUFF REPAIR Bilateral lef 11/15/2009/  right 2013 approx  . TONSILLECTOMY  child  . TOTAL KNEE ARTHROPLASTY  right 08-08-2000/  left 01-28-2003  . TRANSURETHRAL RESECTION OF PROSTATE Left 12/24/2016   Procedure: TRANSURETHRAL RESECTION OF THE PROSTATE (TURP);  Surgeon: Festus Aloe, MD;  Location: [facility];  Service: Urology;  Laterality: Left;    Allergies: Ambien [zolpidem tartrate]  Medications: Prior to Admission medications   Medication Sig Start Date End Date Taking? Authorizing Provider  acetaminophen (TYLENOL) 650 MG CR tablet Take 1,300 mg by mouth every morning.   Yes [provider]  allopurinol (ZYLOPRIM) 300 MG tablet Take 300 mg by mouth every morning.    Yes [provider]  ALPRAZolam Duanne Moron) 0.5 MG tablet Take 0.5 mg by mouth at bedtime.  01/29/17  Yes [provider]  atorvastatin (LIPITOR) 40 MG tablet Take 1 tablet by mouth at bedtime.  06/25/13  Yes [provider]  carbidopa-levodopa (SINEMET IR) 25-100 MG tablet Take 1 tablet 3 (three) times daily by mouth. 10/13/17  Yes Tat, Eustace Quail, DO  escitalopram (LEXAPRO) 10 MG tablet Take 10 mg by mouth every morning.  05/20/16  Yes [provider]  finasteride (PROSCAR) 5 MG tablet Take 5 mg daily by mouth. 07/26/17  Yes [provider]  fluticasone (FLONASE) 50 MCG/ACT nasal spray Place 1 spray into both nostrils daily as needed for allergies.  09/15/17  Yes [provider]  hydroxypropyl methylcellulose / hypromellose (ISOPTO TEARS / GONIOVISC) 2.5 % ophthalmic solution Place 1 drop into both eyes daily as needed for dry eyes.   Yes  [provider]  levothyroxine (SYNTHROID, LEVOTHROID) 25 MCG tablet Take 25 mcg by mouth daily before breakfast.  12/05/15  Yes [provider]  omeprazole (PRILOSEC) 40 MG capsule Take 40 mg by mouth daily.  10/03/17  Yes [provider]  Pimavanserin Tartrate (NUPLAZID) 34 MG CAPS Take 1 capsule by mouth daily. 03/13/18  Yes Tat, Eustace Quail, DO  polyethylene glycol (MIRALAX / GLYCOLAX) packet Take 17 g by mouth daily as needed for moderate constipation.    Yes [provider]  rivaroxaban (XARELTO) 20 MG TABS tablet Take 1 tablet (20 mg total) by mouth daily with supper. 12/25/16  Yes Festus Aloe, MD  nebivolol (BYSTOLIC) 10 MG tablet Take 5 mg by mouth every morning.     [provider]     Family History  Problem Relation Age of Onset  . Hyperlipidemia Mother   . Hypertension Mother   . Hyperlipidemia Father   . Hypertension Father   . Heart disease Father   .  Heart attack Father        died age 34  . Cancer Sister   . Hyperlipidemia Brother   . Hypertension Daughter     Social History   Socioeconomic History  . Marital status: Married    Spouse name: Not on file  . Number of children: 3  . Years of education: Not on file  . Highest education level: Not on file  Occupational History  . 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 Financial resource strain: Not on file  . Food insecurity:    Worry: Not on file    Inability: Not on file  . Transportation needs:    Medical: Not on file    Non-medical: Not on file  Tobacco Use  . Smoking status: Former Smoker    Years: 20.00    Types: Cigarettes    Last attempt to quit: 10/01/1974    Years since quitting: 43.7  . Smokeless tobacco: Never Used  Substance and Sexual Activity  . Alcohol use: No    Alcohol/week: 0.0 oz  . Drug use: No  . Sexual activity: Not on file  Lifestyle  . Physical activity:    Days per week: Not on file     Minutes per session: Not on file  . Stress: Not on file  Relationships  . Social connections:    Talks on phone: Not on file    Gets together: Not on file    Attends religious service: Not on file    Active member of club or organization: Not on file    Attends meetings of clubs or organizations: Not on file    Relationship status: Not on file  Other Topics Concern  . Not on file  Social History Narrative   Married   2 daughters, 1 son     Review of Systems: A 12 point ROS discussed and pertinent positives are indicated in the HPI above.  All other systems are negative.  Review of Systems  HENT: Negative.   Respiratory: Negative.   Cardiovascular: Negative.   Gastrointestinal: Negative.   Genitourinary: Negative.   Musculoskeletal: Positive for gait problem.  Neurological: Positive for tremors.    Vital Signs: BP (!) 144/60   Pulse 64   Temp 98.1 F (36.7 C) (Oral)   Resp 14   Ht 6' (1.829 m)   Wt 195 lb (88.5 kg)   SpO2 97%   BMI 26.45 kg/m   Physical Exam  Constitutional: He is oriented to person, place, and time. No distress.  HENT:  Head: Normocephalic and atraumatic.  Neck: Neck supple. No JVD present. No thyromegaly present.  Cardiovascular: Normal rate, regular rhythm and normal heart sounds. Exam reveals no gallop and no friction rub.  No murmur heard. Pulmonary/Chest: Effort normal and breath sounds normal. No stridor. No respiratory distress. He has no wheezes. He has no rales.  Abdominal: Soft. Bowel sounds are normal. He exhibits no distension. There is no tenderness. There is no rebound and no guarding.  Musculoskeletal: He exhibits no edema.  Lymphadenopathy:    He has no cervical adenopathy.  Neurological: He is alert and oriented to person, place, and time.  Skin: Skin is warm and dry. He is not diaphoretic.  Vitals reviewed.   Imaging: Ct Abdomen Pelvis W Contrast  Result Date: 06/04/2018 CLINICAL DATA:  Epigastric pain. EXAM: CT ABDOMEN  AND PELVIS WITH CONTRAST TECHNIQUE: Multidetector CT imaging of the abdomen and pelvis  was performed using the standard protocol following bolus administration of intravenous contrast. CONTRAST:  115m ISOVUE-300 IOPAMIDOL (ISOVUE-300) INJECTION 61% COMPARISON:  CT abdomen pelvis 01/06/2017 FINDINGS: LOWER CHEST: No basilar pulmonary nodules or pleural effusion. No apical pericardial effusion. HEPATOBILIARY: Unchanged intrahepatic biliary dilatation, likely compensatory in postcholecystectomy setting. Multiple small hepatic hypodensities, likely cysts. Status post cholecystectomy. PANCREAS: Normal parenchymal contours without ductal dilatation. No peripancreatic fluid collection. SPLEEN: Normal. ADRENALS/URINARY TRACT: --Adrenal glands: Normal. --Right kidney/ureter: Unchanged 9 mm nonobstructive stone in the right renal collecting system. Multiple other nonobstructing renal stones measure up to 6 mm, unchanged. The right ureter is unobstructed. --Left kidney/ureter: The left ureter is unobstructed and there is no hydronephrosis. Upper pole nonobstructive stone is unchanged in position and measures 4 mm. --Urinary bladder: Normal for degree of distention STOMACH/BOWEL: --Stomach/Duodenum: No hiatal hernia or other gastric abnormality. Normal duodenal course. --Small bowel: No dilatation or inflammation. --Colon: Rectosigmoid diverticulosis without acute inflammation. --Appendix: Normal. VASCULAR/LYMPHATIC: Infrarenal abdominal aortic aneurysm has increased in size, now measuring 5.4 x 4.8 cm, previously 4.9 x 4.4 cm. There is an endoleak at the lower aspect of the repair. No abdominal or pelvic lymphadenopathy. REPRODUCTIVE: Normal prostate and seminal vesicles. MUSCULOSKELETAL. Multilevel degenerative disc disease and facet arthrosis. No bony spinal canal stenosis. There is interbody fusion hardware at L2-3 and L3-4. Posterior decompression at the L3-L5 levels. OTHER: None. IMPRESSION: 1. Enlarging infrarenal  abdominal aortic aneurysm, now measuring 5.4 x 4.8 cm, secondary to endoleak at the inferior aspect of the repair. This is probably a type 1B endoleak, but is incompletely evaluated on this non angiographic study. Vascular surgery consultation should be considered. 2. Bilateral nonobstructive nephrolithiasis, unchanged. 3. Unchanged intrahepatic biliary dilatation, possibly compensatory due to prior cholecystectomy. Correlation with liver function testing is recommended. Electronically Signed   By: KUlyses JarredM.D.   On: 06/04/2018 14:58   Ir Radiologist Eval & Mgmt  Result Date: 06/24/2018 Please refer to notes tab for details about interventional procedure. (Op Note)   Labs:  CBC: Recent Labs    06/12/18 0620  HGB 12.9*  HCT 38.0*    COAGS: No results for input(s): INR, APTT in the last 8760 hours.  BMP: Recent Labs    06/12/18 0620  NA 142  K 3.5  CL 100  GLUCOSE 116*  BUN 18  CREATININE 1.10    Assessment and Plan:  I met with Mr. REttingerand his wife.  We reviewed the CT of the abdomen dated 06/04/2018.  I currently measure maximal sac diameter as approximately 4.9 x 5.4 cm.  The region of endoleak is a bilobed shaped area of prominent sac perfusion measuring up to 2.1 cm in maximum diameter on the sagittal images along the inferior and posterior right aspect of the aneurysm sac.  A culprit inflow vessel is difficult to delineate on the standard CT.  This could be arising from retrograde flow in a lumbar artery or other collateral vessel.    Based on arteriography already performed by Dr. DScot Dock inflow appears to arise from a collateral vessel off of the right internal iliac artery.  I told Mr. RBraleythat I will review this arteriogram at the hospital.  This is an unusual endoleak to have developed in such a delayed fashion more than 18 years after endograft placement.  It is possible that chronic anticoagulation over the last 5 years may have contributed to some  propensity for development of such a delayed endoleak.   Given the prominent focal perfusion present  on the CT as well as aneurysm sac enlargement, it is unlikely that this will completely resolve spontaneously without treatment.  Attempt at transcatheter catheterization of a collateral pathway and treatment of the endoleak with transcatheter embolization would be indicated.  Christian Wiley and his wife would like to proceed with scheduling endoleak repair.  The procedure will be performed at [facility] on an outpatient basis.  We will begin the scheduling process.  Once the procedure is scheduled I will have him hold his Xarelto for a few days prior to the embolization procedure.  Thank you for this interesting consult.  I greatly enjoyed meeting Christian Wiley and look forward to participating in their care.  A copy of this report was sent to the requesting provider on this date.  Electronically SignedAletta Edouard T 06/24/2018, 4:37 PM     I spent a total of 40 Minutes in face to face in clinical consultation, greater than 50% of which was counseling/coordinating care for management of a type II endoleak.

## 2018-06-25 ENCOUNTER — Telehealth (HOSPITAL_COMMUNITY): Payer: Self-pay

## 2018-06-25 ENCOUNTER — Other Ambulatory Visit (HOSPITAL_COMMUNITY): Payer: Self-pay | Admitting: Interventional Radiology

## 2018-06-25 DIAGNOSIS — T82330A Leakage of aortic (bifurcation) graft (replacement), initial encounter: Secondary | ICD-10-CM

## 2018-06-25 DIAGNOSIS — IMO0002 Reserved for concepts with insufficient information to code with codable children: Secondary | ICD-10-CM

## 2018-06-25 NOTE — Telephone Encounter (Signed)
Called to schedule endoleak, no answer, left vm. AW  °

## 2018-07-01 ENCOUNTER — Other Ambulatory Visit: Payer: Self-pay | Admitting: Radiology

## 2018-07-02 ENCOUNTER — Other Ambulatory Visit: Payer: Self-pay | Admitting: Radiology

## 2018-07-03 ENCOUNTER — Ambulatory Visit (HOSPITAL_COMMUNITY)
Admission: RE | Admit: 2018-07-03 | Discharge: 2018-07-03 | Disposition: A | Discharge status: Home / Self Care | Payer: PPO | Source: Ambulatory Visit | Attending: Interventional Radiology | Admitting: Interventional Radiology

## 2018-07-03 ENCOUNTER — Encounter (HOSPITAL_COMMUNITY): Payer: Self-pay

## 2018-07-03 ENCOUNTER — Other Ambulatory Visit (HOSPITAL_COMMUNITY): Payer: Self-pay | Admitting: Interventional Radiology

## 2018-07-03 ENCOUNTER — Other Ambulatory Visit: Payer: PPO

## 2018-07-03 DIAGNOSIS — Z7951 Long term (current) use of inhaled steroids: Secondary | ICD-10-CM | POA: Diagnosis not present

## 2018-07-03 DIAGNOSIS — T82330A Leakage of aortic (bifurcation) graft (replacement), initial encounter: Secondary | ICD-10-CM

## 2018-07-03 DIAGNOSIS — E785 Hyperlipidemia, unspecified: Secondary | ICD-10-CM | POA: Insufficient documentation

## 2018-07-03 DIAGNOSIS — I10 Essential (primary) hypertension: Secondary | ICD-10-CM | POA: Insufficient documentation

## 2018-07-03 DIAGNOSIS — G20 Parkinson's disease: Secondary | ICD-10-CM | POA: Diagnosis not present

## 2018-07-03 DIAGNOSIS — M5136 Other intervertebral disc degeneration, lumbar region: Secondary | ICD-10-CM | POA: Diagnosis not present

## 2018-07-03 DIAGNOSIS — Z7901 Long term (current) use of anticoagulants: Secondary | ICD-10-CM | POA: Diagnosis not present

## 2018-07-03 DIAGNOSIS — Z8249 Family history of ischemic heart disease and other diseases of the circulatory system: Secondary | ICD-10-CM | POA: Insufficient documentation

## 2018-07-03 DIAGNOSIS — Y832 Surgical operation with anastomosis, bypass or graft as the cause of abnormal reaction of the patient, or of later complication, without mention of misadventure at the time of the procedure: Secondary | ICD-10-CM | POA: Diagnosis not present

## 2018-07-03 DIAGNOSIS — IMO0002 Reserved for concepts with insufficient information to code with codable children: Secondary | ICD-10-CM

## 2018-07-03 DIAGNOSIS — K219 Gastro-esophageal reflux disease without esophagitis: Secondary | ICD-10-CM | POA: Diagnosis not present

## 2018-07-03 DIAGNOSIS — E039 Hypothyroidism, unspecified: Secondary | ICD-10-CM | POA: Insufficient documentation

## 2018-07-03 DIAGNOSIS — I4510 Unspecified right bundle-branch block: Secondary | ICD-10-CM | POA: Insufficient documentation

## 2018-07-03 DIAGNOSIS — Z981 Arthrodesis status: Secondary | ICD-10-CM | POA: Diagnosis not present

## 2018-07-03 DIAGNOSIS — Z87891 Personal history of nicotine dependence: Secondary | ICD-10-CM | POA: Insufficient documentation

## 2018-07-03 DIAGNOSIS — G8929 Other chronic pain: Secondary | ICD-10-CM | POA: Insufficient documentation

## 2018-07-03 DIAGNOSIS — G4730 Sleep apnea, unspecified: Secondary | ICD-10-CM | POA: Diagnosis not present

## 2018-07-03 DIAGNOSIS — K5909 Other constipation: Secondary | ICD-10-CM | POA: Diagnosis not present

## 2018-07-03 DIAGNOSIS — Z86711 Personal history of pulmonary embolism: Secondary | ICD-10-CM | POA: Diagnosis not present

## 2018-07-03 DIAGNOSIS — I714 Abdominal aortic aneurysm, without rupture: Secondary | ICD-10-CM | POA: Diagnosis not present

## 2018-07-03 DIAGNOSIS — T82330S Leakage of aortic (bifurcation) graft (replacement), sequela: Secondary | ICD-10-CM | POA: Diagnosis not present

## 2018-07-03 DIAGNOSIS — M109 Gout, unspecified: Secondary | ICD-10-CM | POA: Insufficient documentation

## 2018-07-03 HISTORY — PX: IR ANGIOGRAM PELVIS SELECTIVE OR SUPRASELECTIVE: IMG661

## 2018-07-03 HISTORY — PX: IR US GUIDE VASC ACCESS LEFT: IMG2389

## 2018-07-03 HISTORY — PX: IR US GUIDE VASC ACCESS RIGHT: IMG2390

## 2018-07-03 LAB — BASIC METABOLIC PANEL
Anion gap: 11 (ref 5–15)
BUN: 19 mg/dL (ref 8–23)
CHLORIDE: 104 mmol/L (ref 98–111)
CO2: 25 mmol/L (ref 22–32)
Calcium: 9.4 mg/dL (ref 8.9–10.3)
Creatinine, Ser: 0.97 mg/dL (ref 0.61–1.24)
GFR calc Af Amer: >60 mL/min (ref >60)
GLUCOSE: 140 mg/dL — AB (ref 70–99)
Potassium: 3.6 mmol/L (ref 3.5–5.1)
SODIUM: 140 mmol/L (ref 135–145)

## 2018-07-03 LAB — CBC
HEMATOCRIT: 41.4 % (ref 39.0–52.0)
HEMOGLOBIN: 13.3 g/dL (ref 13.0–17.0)
MCH: 29.4 pg (ref 26.0–34.0)
MCHC: 32.1 g/dL (ref 30.0–36.0)
MCV: 91.6 fL (ref 78.0–100.0)
Platelets: 144 10*3/uL — ABNORMAL LOW (ref 150–400)
RBC: 4.52 MIL/uL (ref 4.22–5.81)
RDW: 14.3 % (ref 11.5–15.5)
WBC: 9.6 10*3/uL (ref 4.0–10.5)

## 2018-07-03 LAB — PROTIME-INR
INR: 0.95
PROTHROMBIN TIME: 12.6 s (ref 11.4–15.2)

## 2018-07-03 MED ORDER — LIDOCAINE HCL 1 % IJ SOLN
INTRAMUSCULAR | Status: AC
  Filled 2018-07-03: qty 20

## 2018-07-03 MED ORDER — HYDROCODONE-ACETAMINOPHEN 5-325 MG PO TABS: 1.0000 | ORAL_TABLET | ORAL | Status: DC | PRN

## 2018-07-03 MED ORDER — SODIUM CHLORIDE 0.9 % IV SOLN: INTRAVENOUS | Status: DC

## 2018-07-03 MED ORDER — FENTANYL CITRATE (PF) 100 MCG/2ML IJ SOLN
INTRAMUSCULAR | Status: AC
  Filled 2018-07-03: qty 2

## 2018-07-03 MED ORDER — IOPAMIDOL (ISOVUE-300) INJECTION 61%
INTRAVENOUS | Status: AC
  Administered 2018-07-03: 20 mL
  Filled 2018-07-03: qty 100

## 2018-07-03 MED ORDER — FENTANYL CITRATE (PF) 100 MCG/2ML IJ SOLN
INTRAMUSCULAR | Status: AC | PRN
  Administered 2018-07-03: 50 ug via INTRAVENOUS
  Administered 2018-07-03 (×2): 25 ug via INTRAVENOUS

## 2018-07-03 MED ORDER — IOHEXOL 300 MG/ML  SOLN: 50.0000 mL | Freq: Once | INTRAMUSCULAR | Status: DC | PRN

## 2018-07-03 MED ORDER — MIDAZOLAM HCL 2 MG/2ML IJ SOLN
INTRAMUSCULAR | Status: AC | PRN
  Administered 2018-07-03: 0.5 mg via INTRAVENOUS
  Administered 2018-07-03: 1 mg via INTRAVENOUS

## 2018-07-03 MED ORDER — MIDAZOLAM HCL 2 MG/2ML IJ SOLN
INTRAMUSCULAR | Status: AC
  Filled 2018-07-03: qty 2

## 2018-07-03 MED ORDER — CEFAZOLIN SODIUM-DEXTROSE 2-4 GM/100ML-% IV SOLN
INTRAVENOUS | Status: AC
Start: 2018-07-03 — End: 2018-07-03
  Administered 2018-07-03: 2000 mg
  Filled 2018-07-03: qty 100

## 2018-07-03 NOTE — Discharge Instructions (Addendum)
Femoral Site Care °Refer to this sheet in the next few weeks. These instructions provide you with information about caring for yourself after your procedure. Your health care provider may also give you more specific instructions. Your treatment has been planned according to current medical practices, but problems sometimes occur. Call your health care provider if you have any problems or questions after your procedure. °What can I expect after the procedure? °After your procedure, it is typical to have the following: °· Bruising at the site that usually fades within 1-2 weeks. °· Blood collecting in the tissue (hematoma) that may be painful to the touch. It should usually decrease in size and tenderness within 1-2 weeks. ° °Follow these instructions at home: °· Take medicines only as directed by your health care provider. °· You may shower 24-48 hours after the procedure or as directed by your health care provider. Remove the bandage (dressing) and gently wash the site with plain soap and water. Pat the area dry with a clean towel. Do not rub the site, because this may cause bleeding. °· Do not take baths, swim, or use a hot tub until your health care provider approves. °· Check your insertion site every day for redness, swelling, or drainage. °· Do not apply powder or lotion to the site. °· Limit use of stairs to twice a day for the first 2-3 days or as directed by your health care provider. °· Do not squat for the first 2-3 days or as directed by your health care provider. °· Do not lift over 10 lb (4.5 kg) for 5 days after your procedure or as directed by your health care provider. °· Ask your health care provider when it is okay to: °? Return to work or school. °? Resume usual physical activities or sports. °? Resume sexual activity. °· Do not drive home if you are discharged the same day as the procedure. Have someone else drive you. °· You may drive 24 hours after the procedure unless otherwise instructed by  your health care provider. °· Do not operate machinery or power tools for 24 hours after the procedure or as directed by your health care provider. °· If your procedure was done as an outpatient procedure, which means that you went home the same day as your procedure, a responsible adult should be with you for the first 24 hours after you arrive home. °· Keep all follow-up visits as directed by your health care provider. This is important. °Contact a health care provider if: °· You have a fever. °· You have chills. °· You have increased bleeding from the site. Hold pressure on the site. °Get help right away if: °· You have unusual pain at the site. °· You have redness, warmth, or swelling at the site. °· You have drainage (other than a small amount of blood on the dressing) from the site. °· The site is bleeding, and the bleeding does not stop after 30 minutes of holding steady pressure on the site. °· Your leg or foot becomes pale, cool, tingly, or numb. °This information is not intended to replace advice given to you by your health care provider. Make sure you discuss any questions you have with your health care provider. °Document Released: 07/29/2014 Document Revised: 05/02/2016 Document Reviewed: 06/14/2014 °Elsevier Interactive Patient Education © 2018 [employer] °Moderate Conscious Sedation, Adult, Care After °These instructions provide you with information about caring for yourself after your procedure. Your health care provider may also give you more   specific instructions. Your treatment has been planned according to current medical practices, but problems sometimes occur. Call your health care provider if you have any problems or questions after your procedure. °What can I expect after the procedure? °After your procedure, it is common: °· To feel sleepy for several hours. °· To feel clumsy and have poor balance for several hours. °· To have poor judgment for several hours. °· To vomit if you eat too  soon. ° °Follow these instructions at home: °For at least 24 hours after the procedure: ° °· Do not: °? Participate in activities where you could fall or become injured. °? Drive. °? Use heavy machinery. °? Drink alcohol. °? Take sleeping pills or medicines that cause drowsiness. °? Make important decisions or sign legal documents. °? Take care of children on your own. °· Rest. °Eating and drinking °· Follow the diet recommended by your health care provider. °· If you vomit: °? Drink water, juice, or soup when you can drink without vomiting. °? Make sure you have little or no nausea before eating solid foods. °General instructions °· Have a responsible adult stay with you until you are awake and alert. °· Take over-the-counter and prescription medicines only as told by your health care provider. °· If you smoke, do not smoke without supervision. °· Keep all follow-up visits as told by your health care provider. This is important. °Contact a health care provider if: °· You keep feeling nauseous or you keep vomiting. °· You feel light-headed. °· You develop a rash. °· You have a fever. °Get help right away if: °· You have trouble breathing. °This information is not intended to replace advice given to you by your health care provider. Make sure you discuss any questions you have with your health care provider. °Document Released: 09/15/2013 Document Revised: 04/29/2016 Document Reviewed: 03/16/2016 °Elsevier Interactive Patient Education © 2018 [employer] ° °

## 2018-07-03 NOTE — Procedures (Signed)
Interventional Radiology Procedure Note  Procedure: Selective bilateral pelvic arteriography  Complications: None  Estimated Blood Loss: < 10 mL  Findings: Right internal iliac and left internal iliac selective arteriography performed via bilateral common femoral arterial access shows resolution of type II endoleak previously demonstrated by arteriography on 7/5.  Likely secondary to being off anticoagulation for a week.  Left iliac collateral does reconstitute a left lumbar artery, but no retrograde perfusion of aortic sac identified.  Plan: Follow up CTA in 2-3 months.  Stay off anticoagulation for now.  Will discuss w/ Dr. Scot Dock.  Venetia Night. Kathlene Cote, M.D Pager:  [phone]

## 2018-07-03 NOTE — H&P (Signed)
Chief Complaint: Patient was seen in consultation today for endoleak repair with transcatheter embolization at the request of Dr Rosalia Hammers  Supervising Physician: Aletta Edouard  Patient Status: [facility] - Out-pt  History of Present Illness: Christian Wiley is a 83 y.o. male   AAA repair 2001- Dr Rosalia Hammers Stable per imaging until recently noted enlargement CT 05/2018: 1. Enlarging infrarenal abdominal aortic aneurysm, now measuring 5.4 x 4.8 cm, secondary to endoleak at the inferior aspect of the repair. This is probably a type 1B endoleak, but is incompletely evaluated on this non angiographic study. Vascular surgery consultation should be considered.  Referred to Dr Idamae Schuller 06/24/18 Based on arteriography already performed by Dr. Scot Dock, inflow appears to arise from a collateral vessel off of the right internal iliac artery.  I told [name] that I will review this arteriogram at the hospital.  This is an unusual endoleak to have developed in such a delayed fashion more than 18 years after endograft placement.  It is possible that chronic anticoagulation over the last 5 years may have contributed to some propensity for development of such a delayed endoleak.  Given the prominent focal perfusion present on the CT as well as aneurysm sac enlargement, it is unlikely that this will completely resolve spontaneously without treatment.  Attempt at transcatheter catheterization of a collateral pathway and treatment of the endoleak with transcatheter embolization would be indicated.  [name] and his wife would like to proceed with scheduling endoleak repair.   Now scheduled for same Last dose Xarelto Mon per wife  Past Medical History:  Diagnosis Date  . Anticoagulant long-term use    xarelto-- due to hx PE's   . Chronic constipation   . Chronic low back pain   . DDD (degenerative disc disease), lumbar   . GERD (gastroesophageal reflux disease)   . H/O endovascular  stent graft for abdominal aortic aneurysm followed by dr Scot Dock   04/ 2001  s/p infarenal AAA w/ AneuRx graft stent:  last duplex 08-14-2016 repair patent, AAA sac size 4.5cm x 4.72cm (stable), no endoleak detected  . Hiatal hernia   . History of adenomatous polyp of colon    2010-- tubular adenoma and hyperplastic  . History of basal cell carcinoma excision    yrs ago  . History of BPH   . History of gout    per pt's wife gout stable as of 12-19-2016  . History of pulmonary embolus (PE)    04/ 2011 and 10/ 2014  . History of skin cancer   . Hyperlipidemia   . Hypertension   . Hypothyroidism   . Left ureteral stone   . Mild sleep apnea    per study 06-04-2016 very mild sleep apnea not obstructive , cpap not recommended  . OA (osteoarthritis)   . RBBB (right bundle branch block)   . Vascular dementia   . Vascular parkinsonism [facility])    neurologist-  dr tat  . Wears hearing aid    bilateral   . Wears partial dentures    upper    Past Surgical History:  Procedure Laterality Date  . ABDOMINAL AORTIC ENDOVASCULAR STENT GRAFT  04/ 2001   dr Scot Dock   AneuRx graft stent  . ABDOMINAL AORTOGRAM N/A 06/12/2018   Procedure: ABDOMINAL AORTOGRAM;  Surgeon: Angelia Mould, MD;  Location: [place] [facility];  Service: Cardiovascular;  Laterality: N/A;  . CATARACT EXTRACTION W/ INTRAOCULAR LENS  IMPLANT, BILATERAL  2001 approx.  Marland Kitchen  CHOLECYSTECTOMY OPEN  1980's  . COLONOSCOPY  last one 01/ 2010  . CYSTOSCOPY/RETROGRADE/URETEROSCOPY/STONE EXTRACTION WITH BASKET Left 12/24/2016   Procedure: CYSTOSCOPY/RETROGRADE/URETEROSCOPY/STONE EXTRACTION WITH BASKET HOLMIUM LASER STENT PLACEMENT;  Surgeon: Festus Aloe, MD;  Location[facility];  Service: Urology;  Laterality: Left;  . EXCISION/RELEASE BURSA HIP Right 07/15/2014   Procedure: RIGHT HIP BURSECTOMY WITH TENDON REPAIR ;  Surgeon: Gearlean Alf, MD;  Location: [facility];  Service: Orthopedics;  Laterality: Right;  .  HOLMIUM LASER APPLICATION Left 1/61/0960   Procedure: HOLMIUM LASER APPLICATION;  Surgeon: Festus Aloe, MD;  Location: [facility];  Service: Urology;  Laterality: Left;  . IR RADIOLOGIST EVAL & MGMT  06/24/2018  . KNEE ARTHROSCOPY Bilateral 1990's-- right x2 ;  left x1  . LOWER EXTREMITY ANGIOGRAM  06/12/2018   Procedure: Lower Extremity Angiogram;  Surgeon: Angelia Mould, MD;  Location: [place] [facility];  Service: Cardiovascular;;  . POSTERIOR LAMINECTOMY / DECOMPRESSION LUMBAR SPINE  03/24/2002   L2 -- S1  . POSTERIOR LUMBAR FUSION  11/16/2010   L2 -- L4  . SHOULDER OPEN ROTATOR CUFF REPAIR Bilateral lef 11/15/2009/  right 2013 approx  . TONSILLECTOMY  child  . TOTAL KNEE ARTHROPLASTY  right 08-08-2000/  left 01-28-2003  . TRANSURETHRAL RESECTION OF PROSTATE Left 12/24/2016   Procedure: TRANSURETHRAL RESECTION OF THE PROSTATE (TURP);  Surgeon: Festus Aloe, MD;  Location: [facility];  Service: Urology;  Laterality: Left;    Allergies: Ambien [zolpidem tartrate]  Medications: Prior to Admission medications   Medication Sig Start Date End Date Taking? Authorizing Provider  acetaminophen (TYLENOL) 650 MG CR tablet Take 1,300 mg by mouth every morning.    [provider]  allopurinol (ZYLOPRIM) 300 MG tablet Take 300 mg by mouth every morning.     [provider]  ALPRAZolam Duanne Moron) 0.5 MG tablet Take 0.5 mg by mouth at bedtime.  01/29/17   [provider]  atorvastatin (LIPITOR) 40 MG tablet Take 1 tablet by mouth at bedtime.  06/25/13   [provider]  carbidopa-levodopa (SINEMET IR) 25-100 MG tablet Take 1 tablet 3 (three) times daily by mouth. 10/13/17   Tat, Eustace Quail, DO  escitalopram (LEXAPRO) 10 MG tablet Take 10 mg by mouth every morning.  05/20/16   [provider]  finasteride (PROSCAR) 5 MG tablet Take 5 mg daily by mouth. 07/26/17   [provider]  fluticasone (FLONASE) 50  MCG/ACT nasal spray Place 1 spray into both nostrils daily as needed for allergies.  09/15/17   [provider]  hydroxypropyl methylcellulose / hypromellose (ISOPTO TEARS / GONIOVISC) 2.5 % ophthalmic solution Place 1 drop into both eyes daily as needed for dry eyes.    [provider]  levothyroxine (SYNTHROID, LEVOTHROID) 25 MCG tablet Take 25 mcg by mouth daily before breakfast.  12/05/15   [provider]  nebivolol (BYSTOLIC) 10 MG tablet Take 5 mg by mouth every morning.     [provider]  omeprazole (PRILOSEC) 40 MG capsule Take 40 mg by mouth daily.  10/03/17   [provider]  Pimavanserin Tartrate (NUPLAZID) 34 MG CAPS Take 1 capsule by mouth daily. 03/13/18   Tat, Eustace Quail, DO  polyethylene glycol (MIRALAX / GLYCOLAX) packet Take 17 g by mouth daily as needed for moderate constipation.     [provider]  rivaroxaban (XARELTO) 20 MG TABS tablet Take 1 tablet (20 mg total) by mouth daily with supper. 12/25/16  Festus Aloe, MD     Family History  Problem Relation Age of Onset  . Hyperlipidemia Mother   . Hypertension Mother   . Hyperlipidemia Father   . Hypertension Father   . Heart disease Father   . Heart attack Father        died age 85  . Cancer Sister   . Hyperlipidemia Brother   . Hypertension Daughter     Social History   Socioeconomic History  . Marital status: Married    Spouse name: Not on file  . Number of children: 3  . Years of education: Not on file  . Highest education level: Not on file  Occupational History  . 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 Financial resource strain: Not on file  . Food insecurity:    Worry: Not on file    Inability: Not on file  . Transportation needs:    Medical: Not on file    Non-medical: Not on file  Tobacco Use  . Smoking status: Former Smoker    Years: 20.00    Types: Cigarettes    Last attempt to quit: 10/01/1974      Years since quitting: 43.7  . Smokeless tobacco: Never Used  Substance and Sexual Activity  . Alcohol use: No    Alcohol/week: 0.0 oz  . Drug use: No  . Sexual activity: Not on file  Lifestyle  . Physical activity:    Days per week: Not on file    Minutes per session: Not on file  . Stress: Not on file  Relationships  . Social connections:    Talks on phone: Not on file    Gets together: Not on file    Attends religious service: Not on file    Active member of club or organization: Not on file    Attends meetings of clubs or organizations: Not on file    Relationship status: Not on file  Other Topics Concern  . Not on file  Social History Narrative   Married   2 daughters, 1 son    Review of Systems: A 12 point ROS discussed and pertinent positives are indicated in the HPI above.  All other systems are negative.  Review of Systems  Constitutional: Negative for activity change, fatigue and fever.  Respiratory: Negative for cough and shortness of breath.   Cardiovascular: Negative for chest pain.  Gastrointestinal: Negative for abdominal distention and abdominal pain.  Musculoskeletal: Negative for back pain.  Neurological: Positive for weakness.  Psychiatric/Behavioral: Negative for behavioral problems.       Mild forgetfulness    Vital Signs: BP (!) 154/63   Pulse (!) 54   Temp 97.6 F (36.4 C) (Oral)   Resp 16   Ht 6' (1.829 m)   Wt 200 lb (90.7 kg)   SpO2 94%   BMI 27.12 kg/m   Physical Exam  Constitutional: He is oriented to person, place, and time.  Cardiovascular: Normal rate, regular rhythm and normal heart sounds.  Pulmonary/Chest: Effort normal and breath sounds normal.  Abdominal: Soft. Bowel sounds are normal.  Musculoskeletal: Normal range of motion.  Uses cane  Neurological: He is alert and oriented to person, place, and time.  Skin: Skin is warm and dry.  Psychiatric: He has a normal mood and affect. His behavior is normal. Judgment and  thought content normal.  Vitals reviewed.   Imaging: Ct Abdomen Pelvis W Contrast  Result Date: 06/04/2018  CLINICAL DATA:  Epigastric pain. EXAM: CT ABDOMEN AND PELVIS WITH CONTRAST TECHNIQUE: Multidetector CT imaging of the abdomen and pelvis was performed using the standard protocol following bolus administration of intravenous contrast. CONTRAST:  133mL ISOVUE-300 IOPAMIDOL (ISOVUE-300) INJECTION 61% COMPARISON:  CT abdomen pelvis 01/06/2017 FINDINGS: LOWER CHEST: No basilar pulmonary nodules or pleural effusion. No apical pericardial effusion. HEPATOBILIARY: Unchanged intrahepatic biliary dilatation, likely compensatory in postcholecystectomy setting. Multiple small hepatic hypodensities, likely cysts. Status post cholecystectomy. PANCREAS: Normal parenchymal contours without ductal dilatation. No peripancreatic fluid collection. SPLEEN: Normal. ADRENALS/URINARY TRACT: --Adrenal glands: Normal. --Right kidney/ureter: Unchanged 9 mm nonobstructive stone in the right renal collecting system. Multiple other nonobstructing renal stones measure up to 6 mm, unchanged. The right ureter is unobstructed. --Left kidney/ureter: The left ureter is unobstructed and there is no hydronephrosis. Upper pole nonobstructive stone is unchanged in position and measures 4 mm. --Urinary bladder: Normal for degree of distention STOMACH/BOWEL: --Stomach/Duodenum: No hiatal hernia or other gastric abnormality. Normal duodenal course. --Small bowel: No dilatation or inflammation. --Colon: Rectosigmoid diverticulosis without acute inflammation. --Appendix: Normal. VASCULAR/LYMPHATIC: Infrarenal abdominal aortic aneurysm has increased in size, now measuring 5.4 x 4.8 cm, previously 4.9 x 4.4 cm. There is an endoleak at the lower aspect of the repair. No abdominal or pelvic lymphadenopathy. REPRODUCTIVE: Normal prostate and seminal vesicles. MUSCULOSKELETAL. Multilevel degenerative disc disease and facet arthrosis. No bony spinal  canal stenosis. There is interbody fusion hardware at L2-3 and L3-4. Posterior decompression at the L3-L5 levels. OTHER: None. IMPRESSION: 1. Enlarging infrarenal abdominal aortic aneurysm, now measuring 5.4 x 4.8 cm, secondary to endoleak at the inferior aspect of the repair. This is probably a type 1B endoleak, but is incompletely evaluated on this non angiographic study. Vascular surgery consultation should be considered. 2. Bilateral nonobstructive nephrolithiasis, unchanged. 3. Unchanged intrahepatic biliary dilatation, possibly compensatory due to prior cholecystectomy. Correlation with liver function testing is recommended. Electronically Signed   By: Ulyses Jarred M.D.   On: 06/04/2018 14:58   Ir Radiologist Eval & Mgmt  Result Date: 06/24/2018 Please refer to notes tab for details about interventional procedure. (Op Note)   Labs:  CBC: Recent Labs    06/12/18 0620  HGB 12.9*  HCT 38.0*    COAGS: No results for input(s): INR, APTT in the last 8760 hours.  BMP: Recent Labs    06/12/18 0620  NA 142  K 3.5  CL 100  GLUCOSE 116*  BUN 18  CREATININE 1.10    LIVER FUNCTION TESTS: No results for input(s): BILITOT, AST, ALT, ALKPHOS, PROT, ALBUMIN in the last 8760 hours.  TUMOR MARKERS: No results for input(s): AFPTM, CEA, CA199, CHROMGRNA in the last 8760 hours.  Assessment and Plan:  AAA repair 2001- remaining stable for years New noted enlargement Now scheduled for endoleak repair with transcatheter embolization Risks and benefits of endovascular endoleak repair were discussed with the patient including, but not limited to bleeding, infection, vascular injury or contrast induced renal failure.  This interventional procedure involves the use of X-rays and because of the nature of the planned procedure, it is possible that we will have prolonged use of X-ray fluoroscopy.  Potential radiation risks to you include (but are not limited to) the following: - A slightly  elevated risk for cancer  several years later in life. This risk is typically less than 0.5% percent. This risk is low in comparison to the normal incidence of human cancer, which is 33% for women and 50% for men according to the American  Can[employer] Society. - Radiation induced injury can include skin redness, resembling a rash, tissue breakdown / ulcers and hair loss (which can be temporary or permanent).   The likelihood of either of these occurring depends on the difficulty of the procedure and whether you are sensitive to radiation due to previous procedures, disease, or genetic conditions.   IF your procedure requires a prolonged use of radiation, you will be notified and given written instructions for further action.  It is your responsibility to monitor the irradiated area for the 2 weeks following the procedure and to notify your physician if you are concerned that you have suffered a radiation induced injury.    All of the patient's questions were answered, patient is agreeable to proceed.  Consent signed and in chart.  Thank you for this interesting consult.  I greatly enjoyed meeting Hallie Ishida Saine and look forward to participating in their care.  A copy of this report was sent to the requesting provider on this date.  Electronically Signed: Lavonia Drafts, PA-C 07/03/2018, 7:43 AM   I spent a total of  30 Minutes   in face to face in clinical consultation, greater than 50% of which was counseling/coordinating care for endovascular endoleak repair

## 2018-07-06 ENCOUNTER — Other Ambulatory Visit: Payer: PPO

## 2018-07-09 ENCOUNTER — Other Ambulatory Visit (HOSPITAL_COMMUNITY): Payer: Self-pay | Admitting: Interventional Radiology

## 2018-07-09 ENCOUNTER — Other Ambulatory Visit: Payer: Self-pay | Admitting: Vascular Surgery

## 2018-07-09 DIAGNOSIS — T82330A Leakage of aortic (bifurcation) graft (replacement), initial encounter: Secondary | ICD-10-CM

## 2018-07-09 DIAGNOSIS — IMO0002 Reserved for concepts with insufficient information to code with codable children: Secondary | ICD-10-CM

## 2018-07-12 ENCOUNTER — Other Ambulatory Visit: Payer: Self-pay | Admitting: Neurology

## 2018-07-13 ENCOUNTER — Ambulatory Visit: Payer: PPO | Admitting: Podiatry

## 2018-07-13 DIAGNOSIS — D689 Coagulation defect, unspecified: Secondary | ICD-10-CM

## 2018-07-13 DIAGNOSIS — B351 Tinea unguium: Secondary | ICD-10-CM

## 2018-07-13 DIAGNOSIS — M79675 Pain in left toe(s): Secondary | ICD-10-CM

## 2018-07-13 DIAGNOSIS — M79674 Pain in right toe(s): Secondary | ICD-10-CM | POA: Diagnosis not present

## 2018-07-15 NOTE — Progress Notes (Signed)
Subjective: 83 y.o. returns the office today for painful, elongated, thickened toenails which he cannot trim himself. Denies any redness or drainage around the nails. Denies any acute changes since last appointment and no new complaints today. Denies any systemic complaints such as fevers, chills, nausea, vomiting.   PCP: [name], MD  Objective: AAO 3, NAD DP/PT pulses palpable, CRT less than 3 seconds Nails hypertrophic, dystrophic, elongated, brittle, discolored 10. There is tenderness overlying the nails 1-5 bilaterally. There is no surrounding erythema or drainage along the nail sites. No open lesions or pre-ulcerative lesions are identified. No other areas of tenderness bilateral lower extremities. No overlying edema, erythema, increased warmth. No pain with calf compression, swelling, warmth, erythema.  Assessment: Patient presents with symptomatic onychomycosis  Plan: -Treatment options including alternatives, risks, complications were discussed -Nails sharply debrided 10 without complication/bleeding. -Discussed daily foot inspection. If there are any changes, to call the office immediately.  -Follow-up in 3 months or sooner if any problems are to arise. In the meantime, encouraged to call the office with any questions, concerns, changes symptoms.  Celesta Gentile, DPM

## 2018-07-16 ENCOUNTER — Telehealth: Payer: Self-pay | Admitting: Podiatry

## 2018-07-16 NOTE — Telephone Encounter (Signed)
Please give wife a call back about her Husband visit that was done in our office.

## 2018-08-19 ENCOUNTER — Ambulatory Visit (HOSPITAL_COMMUNITY): Payer: PPO

## 2018-08-19 ENCOUNTER — Ambulatory Visit: Payer: PPO | Admitting: Vascular Surgery

## 2018-09-01 ENCOUNTER — Ambulatory Visit
Admission: RE | Admit: 2018-09-01 | Discharge: 2018-09-01 | Disposition: A | Discharge status: Home / Self Care | Payer: PPO | Source: Ambulatory Visit | Attending: Vascular Surgery | Admitting: Vascular Surgery

## 2018-09-01 ENCOUNTER — Encounter: Payer: Self-pay | Admitting: Radiology

## 2018-09-01 ENCOUNTER — Ambulatory Visit
Admission: RE | Admit: 2018-09-01 | Discharge: 2018-09-01 | Disposition: A | Discharge status: Home / Self Care | Payer: PPO | Source: Ambulatory Visit | Attending: Interventional Radiology | Admitting: Interventional Radiology

## 2018-09-01 DIAGNOSIS — IMO0002 Reserved for concepts with insufficient information to code with codable children: Secondary | ICD-10-CM

## 2018-09-01 DIAGNOSIS — T82330A Leakage of aortic (bifurcation) graft (replacement), initial encounter: Secondary | ICD-10-CM | POA: Diagnosis not present

## 2018-09-01 HISTORY — PX: IR RADIOLOGIST EVAL & MGMT: IMG5224

## 2018-09-01 MED ORDER — IOPAMIDOL (ISOVUE-370) INJECTION 76%
75.0000 mL | Freq: Once | INTRAVENOUS | Status: AC | PRN
  Administered 2018-09-01: 75 mL via INTRAVENOUS

## 2018-09-01 NOTE — Progress Notes (Signed)
Chief Complaint: History of EVAR to treat abdominal aortic aneurysm with previous detection of type II endoleak.  History of Present Illness: Christian Wiley is a 83 y.o. male previously referred by Dr. Scot Dock due to enlargement of an aortic aneurysm sac surrounding an endograft placed in 2001 with CT demonstrating a prominent endoleak within the inferior aneurysm sac in June.  Arteriography demonstrated a type II endoleak supplied by collateral branches of the right internal iliac artery.  After holding Xarelto for approximately 1 week prior to arteriography with planned embolization of the endoleak, arteriography on 07/03/2018 demonstrated no further identifiable endoleak with selective arteriography of bilateral internal iliac arteries.  Follow-up CTA is now performed to evaluate for any evidence of further endoleak or change in aneurysm sac size.  Christian Wiley is asymptomatic.  He remains off Xarelto.  His wife states that they have been trying to get up and walk every hour inside of their home to decrease risk of redevelopment of lower extremity DVT.  Past Medical History:  Diagnosis Date  . Anticoagulant long-term use    xarelto-- due to hx PE's   . Chronic constipation   . Chronic low back pain   . DDD (degenerative disc disease), lumbar   . GERD (gastroesophageal reflux disease)   . H/O endovascular stent graft for abdominal aortic aneurysm followed by dr Scot Dock   04/ 2001  s/p infarenal AAA w/ AneuRx graft stent:  last duplex 08-14-2016 repair patent, AAA sac size 4.5cm x 4.72cm (stable), no endoleak detected  . Hiatal hernia   . History of adenomatous polyp of colon    2010-- tubular adenoma and hyperplastic  . History of basal cell carcinoma excision    yrs ago  . History of BPH   . History of gout    per pt's wife gout stable as of 12-19-2016  . History of pulmonary embolus (PE)    04/ 2011 and 10/ 2014  . History of skin cancer   . Hyperlipidemia   . Hypertension     . Hypothyroidism   . Left ureteral stone   . Mild sleep apnea    per study 06-04-2016 very mild sleep apnea not obstructive , cpap not recommended  . OA (osteoarthritis)   . RBBB (right bundle branch block)   . Vascular dementia   . Vascular parkinsonism [facility])    neurologist-  dr tat  . Wears hearing aid    bilateral   . Wears partial dentures    upper    Past Surgical History:  Procedure Laterality Date  . ABDOMINAL AORTIC ENDOVASCULAR STENT GRAFT  04/ 2001   dr Scot Dock   AneuRx graft stent  . ABDOMINAL AORTOGRAM N/A 06/12/2018   Procedure: ABDOMINAL AORTOGRAM;  Surgeon: Angelia Mould, MD;  Location: [facility];  Service: Cardiovascular;  Laterality: N/A;  . CATARACT EXTRACTION W/ INTRAOCULAR LENS  IMPLANT, BILATERAL  2001 approx.  . CHOLECYSTECTOMY OPEN  1980's  . COLONOSCOPY  last one 01/ 2010  . CYSTOSCOPY/RETROGRADE/URETEROSCOPY/STONE EXTRACTION WITH BASKET Left 12/24/2016   Procedure: CYSTOSCOPY/RETROGRADE/URETEROSCOPY/STONE EXTRACTION WITH BASKET HOLMIUM LASER STENT PLACEMENT;  Surgeon: Festus Aloe, MD;  Location: [facility];  Service: Urology;  Laterality: Left;  . EXCISION/RELEASE BURSA HIP Right 07/15/2014   Procedure: RIGHT HIP BURSECTOMY WITH TENDON REPAIR ;  Surgeon: Gearlean Alf, MD;  Location: [facility];  Service: Orthopedics;  Laterality: Right;  . HOLMIUM LASER APPLICATION Left 3/71/6967   Procedure: HOLMIUM LASER APPLICATION;  Surgeon:  Festus Aloe, MD;  Location: [facility];  Service: Urology;  Laterality: Left;  . IR ANGIOGRAM PELVIS SELECTIVE OR SUPRASELECTIVE  07/03/2018  . IR ANGIOGRAM PELVIS SELECTIVE OR SUPRASELECTIVE  07/03/2018  . IR RADIOLOGIST EVAL & MGMT  06/24/2018  . IR RADIOLOGIST EVAL & MGMT  09/01/2018  . IR US GUIDE VASC ACCESS LEFT  07/03/2018  . IR US GUIDE VASC ACCESS RIGHT  07/03/2018  . KNEE ARTHROSCOPY Bilateral 1990's-- right x2 ;  left x1  . LOWER EXTREMITY ANGIOGRAM  06/12/2018    Procedure: Lower Extremity Angiogram;  Surgeon: Angelia Mould, MD;  Location: [facility];  Service: Cardiovascular;;  . POSTERIOR LAMINECTOMY / DECOMPRESSION LUMBAR SPINE  03/24/2002   L2 -- S1  . POSTERIOR LUMBAR FUSION  11/16/2010   L2 -- L4  . SHOULDER OPEN ROTATOR CUFF REPAIR Bilateral lef 11/15/2009/  right 2013 approx  . TONSILLECTOMY  child  . TOTAL KNEE ARTHROPLASTY  right 08-08-2000/  left 01-28-2003  . TRANSURETHRAL RESECTION OF PROSTATE Left 12/24/2016   Procedure: TRANSURETHRAL RESECTION OF THE PROSTATE (TURP);  Surgeon: Festus Aloe, MD;  Location: [facility];  Service: Urology;  Laterality: Left;    Allergies: Ambien [zolpidem tartrate]  Medications: Prior to Admission medications   Medication Sig Start Date End Date Taking? Authorizing Provider  acetaminophen (TYLENOL) 650 MG CR tablet Take 1,300 mg by mouth every morning.   Yes [provider]  allopurinol (ZYLOPRIM) 300 MG tablet Take 300 mg by mouth every morning.    Yes [provider]  ALPRAZolam Duanne Moron) 0.5 MG tablet Take 0.5 mg by mouth at bedtime.  01/29/17  Yes [provider]  atorvastatin (LIPITOR) 40 MG tablet Take 1 tablet by mouth at bedtime.  06/25/13  Yes [provider]  carbidopa-levodopa (SINEMET IR) 25-100 MG tablet TAKE 1 TABLET 3 (THREE) TIMES DAILY BY MOUTH. 07/13/18  Yes Tat, Rebecca S, DO  escitalopram (LEXAPRO) 10 MG tablet Take 10 mg by mouth every morning.  05/20/16  Yes [provider]  finasteride (PROSCAR) 5 MG tablet Take 5 mg daily by mouth. 07/26/17  Yes [provider]  hydroxypropyl methylcellulose / hypromellose (ISOPTO TEARS / GONIOVISC) 2.5 % ophthalmic solution Place 1 drop into both eyes daily as needed for dry eyes.   Yes [provider]  levothyroxine (SYNTHROID, LEVOTHROID) 25 MCG tablet Take 25 mcg by mouth daily before breakfast.  12/05/15  Yes [provider]  nebivolol  (BYSTOLIC) 10 MG tablet Take 5 mg by mouth every morning.    Yes [provider]  omeprazole (PRILOSEC) 40 MG capsule Take 40 mg by mouth daily.  10/03/17  Yes [provider]  Pimavanserin Tartrate (NUPLAZID) 34 MG CAPS Take 1 capsule by mouth daily. 03/13/18  Yes Tat, Eustace Quail, DO  polyethylene glycol (MIRALAX / GLYCOLAX) packet Take 17 g by mouth daily as needed for moderate constipation.    Yes [provider]  fluticasone (FLONASE) 50 MCG/ACT nasal spray Place 1 spray into both nostrils daily as needed for allergies.  09/15/17   [provider]     Family History  Problem Relation Age of Onset  . Hyperlipidemia Mother   . Hypertension Mother   . Hyperlipidemia Father   . Hypertension Father   . Heart disease Father   . Heart attack Father        died age 40  . Cancer Sister   . Hyperlipidemia Brother   . Hypertension Daughter  Social History   Socioeconomic History  . Marital status: Married    Spouse name: Not on file  . Number of children: 3  . Years of education: Not on file  . Highest education level: Not on file  Occupational History  . 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 Financial resource strain: Not on file  . Food insecurity:    Worry: Not on file    Inability: Not on file  . Transportation needs:    Medical: Not on file    Non-medical: Not on file  Tobacco Use  . Smoking status: Former Smoker    Years: 20.00    Types: Cigarettes    Last attempt to quit: 10/01/1974    Years since quitting: 43.9  . Smokeless tobacco: Never Used  Substance and Sexual Activity  . Alcohol use: No    Alcohol/week: 0.0 standard drinks  . Drug use: No  . Sexual activity: Not on file  Lifestyle  . Physical activity:    Days per week: Not on file    Minutes per session: Not on file  . Stress: Not on file  Relationships  . Social connections:    Talks on phone: Not on file    Gets together: Not  on file    Attends religious service: Not on file    Active member of club or organization: Not on file    Attends meetings of clubs or organizations: Not on file    Relationship status: Not on file  Other Topics Concern  . Not on file  Social History Narrative   Married   2 daughters, 1 son    Review of Systems: A 12 point ROS discussed and pertinent positives are indicated in the HPI above.  All other systems are negative.  Review of Systems  Constitutional: Negative.   Respiratory: Negative.   Cardiovascular: Negative.   Gastrointestinal: Negative.   Genitourinary: Negative.   Musculoskeletal: Negative.   Neurological: Negative.     Vital Signs: BP (!) 168/75   Pulse (!) 56   Temp 97.6 F (36.4 C) (Oral)   Resp 14   Ht 6' (1.829 m)   Wt 89.4 kg   SpO2 98%   BMI 26.72 kg/m   Physical Exam  Constitutional: He is oriented to person, place, and time. No distress.  Abdominal: Soft. He exhibits no distension. There is no tenderness. There is no rebound and no guarding.  Musculoskeletal: He exhibits no edema.  Neurological: He is alert and oriented to person, place, and time.  Skin: He is not diaphoretic.  Vitals reviewed.   Imaging: Ir Radiologist Eval & Mgmt  Result Date: 09/01/2018 Please refer to notes tab for details about interventional procedure. (Op Note)  Ct Angio Abd/pel W/ And/or W/o  Result Date: 09/01/2018 CLINICAL DATA:  History of EVAR in 2001 to treat abdominal aortic aneurysm. Recent demonstration by CT prominent endoleak which led to arteriography demonstrating a type II endoleak supplied by internal iliac collaterals. Additional arteriography performed off anticoagulation prior to planned embolization demonstrated spontaneous resolution of the endoleak. Follow-up CTA is now performed to evaluate for any evidence of endoleak and re-evaluate aneurysm sac size. EXAM: CT ANGIOGRAPHY ABDOMEN AND PELVIS WITH CONTRAST AND WITHOUT CONTRAST TECHNIQUE:  Multidetector CT imaging of the abdomen and pelvis was performed using the standard protocol during bolus administration of intravenous contrast. Multiplanar reconstructed images and MIPs were obtained and reviewed to evaluate the vascular anatomy.  CONTRAST:  63mL ISOVUE-370 IOPAMIDOL (ISOVUE-370) INJECTION 76% COMPARISON:  CT of the abdomen and pelvis on 06/04/2018 FINDINGS: VASCULAR Aorta: Previously noted prominent endoleak within the inferior and posterior aspect of the aneurysm sac is no longer present. There is no evidence of endoleak on arterial and delayed phases of imaging. The aneurysm sac size is stable and measures 4.9 x 5.2 cm in greatest width and AP diameter and 5.4 cm in greatest oblique diameter. The endograft shows normal patency and positioning. Celiac: Normally patent.  Normal distal branch vessel anatomy. SMA: Mild calcified plaque at the SMA origin without significant stenosis. Renals: Calcified plaque along the anterior origin of a single right renal artery causes approximately 30-40% stenosis. There are 3 separate left renal arteries demonstrating normal patency. All 3 emanate above the superior margin of the endograft. IMA: IMA trunk shows retrograde opacification but does not demonstrate perfusion of the aortic sac. Inflow: Distal endograft limbs extend into the proximal common iliac arteries bilaterally and demonstrate normal apposition. Native iliac arteries below this level are very tortuous. The distal common iliac arteries demonstrate minimal dilatation measuring 1.5 cm on the left and 1.4 cm on the right. Bilateral native iliac arteries Proximal Outflow: Common femoral arteries and femoral bifurcations demonstrate normal patency. Veins: Delayed imaging shows no venous abnormalities. Review of the MIP images confirms the above findings. NON-VASCULAR Lower chest: Stable mild scarring at both lung bases. Hepatobiliary: The liver is unremarkable and demonstrates a stable small cyst in the  posterior right lobe. The gallbladder has been removed. Stable dilatation of the common bile duct after cholecystectomy measuring approximately 11 mm. Pancreas: Unremarkable. No pancreatic ductal dilatation or surrounding inflammatory changes. Spleen: Normal in size without focal abnormality. Adrenals/Urinary Tract: Right-sided renal calculi again present. Previously noted pelvic calculus now resides in a lower pole infundibulum and measures approximately 10 mm. There are 2 other additional smaller nonobstructing right renal calculi. Stable nonobstructing upper pole calculus of the left kidney. No evidence of hydronephrosis bilaterally. No adrenal masses. The bladder is unremarkable. Stomach/Bowel: Bowel shows no evidence of obstruction or inflammation. Stable diverticulosis of the descending and sigmoid colon. Lymphatic: No enlarged lymph nodes identified. Reproductive: Prostate is unremarkable. Other: Stable small left inguinal hernia containing fat. Musculoskeletal: Stable degenerative disc disease of the lumbar spine with evidence of prior laminectomy and fusion. No hardware present. IMPRESSION: VASCULAR Stable aneurysm sac size with resolution of previously identified type II endoleak. NON-VASCULAR No acute findings. Previously noted right renal pelvic calculus is now in a lower pole infundibulum and measures 10 mm. Electronically Signed   By: Aletta Edouard M.D.   On: 09/01/2018 12:21    Labs:  CBC: Recent Labs    06/12/18 0620 07/03/18 0704  WBC  --  9.6  HGB 12.9* 13.3  HCT 38.0* 41.4  PLT  --  144*    COAGS: Recent Labs    07/03/18 0704  INR 0.95    BMP: Recent Labs    06/12/18 0620 07/03/18 0704  NA 142 140  K 3.5 3.6  CL 100 104  CO2  --  25  GLUCOSE 116* 140*  BUN 18 19  CALCIUM  --  9.4  CREATININE 1.10 0.97  GFRNONAA  --  >60  GFRAA  --  >60     Assessment and Plan:  A follow-up CTA was performed today of the abdomen and pelvis.  I reviewed findings with Mr.  and Mrs. Burnsed.  This demonstrates complete resolution of the previously identified prominent  type II endoleak within the posterior and inferior aspect of the aortic aneurysm sac.  Aneurysm sac size is stable and without enlargement since June.  I recommended continued follow-up with Dr. Scot Dock.  Aneurysm sac size can likely be followed by ultrasound with CTA as necessary, should there be evidence of future aneurysm sac enlargement.  He plans to stay off of Xarelto for now and will follow up with [name].   Electronically SignedAletta Edouard T 09/01/2018, 2:22 PM     I spent a total of 15 Minutes in face to face in clinical consultation, greater than 50% of which was counseling/coordinating care of an aortic endoleak.

## 2018-09-08 NOTE — Progress Notes (Signed)
Christian Wiley was seen today in the movement disorders clinic for neurologic consultation at the request of [name], MD.  The consultation is for the evaluation of gait change and to r/o PD.  This patient is accompanied in the office by his spouse who supplements the history.   Pt states that he has been seen by his chiropractor, [name], for LBP and she recommended he get evaluated by a neurologist as she thought he had parkinsonian features.    09/19/15 update:  Pt returns for f/u accompanied by his wife who supplements the hx.  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  We decided to give him a trial of levodopa.  Today, the patient states that he is only taking it bid instead of the prescribed tid.  He is usually taking it at 10am and 5pm.  Tremor is improved.   He did have an MRI of the brain without gadolinium on 06/28/2015.  There was no significant change compared to his prior MRI of the brain in 2007.  There is evidence of mild small vessel disease.  PT was refused last visit.  He is c/o fatigue.  Has taken his carbidopa/levodopa 25/100 this AM but has not taken his bystolic.  12/21/15 update:  The patient is following up today, accompanied by his wife who supplements the history.  The patient has history of vascular parkinsonism.  Last visit, he was only taking his levodopa twice per day, and I encouraged him to take it 3 times per day as directed.  His wife states that he still doesn't get his midday dose in time.  He was supposed to be taking the medication at 10am (wake up time)/2 pm/6pm.  He has not wanted to attend physical therapy, but continues to exercise at the gym.  He has not had any falls, but does admit that he "slid" out of the bed trying to get out (got new higher mattress).  No hallucinations.  He has had episodes of confusion and will sometimes ask his wife, "who will keep the kids."  He stopped xanax about a week ago and confusion seems  better.  He takes 2 hydrocodone at bedtime for sleep.  That hasn't helped sleep but he is resistant to giving up the medication. No lightheadedness or near syncope.  03/21/16 update:  The patient follows up today, accompanied by his wife who supplements the history.  The patient has a history of vascular parkinsonism.  Last visit, I again encouraged the patient to take his carbidopa/levodopa 3 times per day as opposed to the twice per day that he was previously taking it.  He did set an alarm but is still inconsistent with that.   He did attend physical therapy since our last visit, and I congratulated him on that.  Last visit, we talked about memory loss and I thought that medications contribute, particularly the hydrocodone and I encouraged him to decrease that to 1 tablet per night and add melatonin for sleep.  He did decrease the hydrocodone.  The melatonin didn't help him sleep.  Wife states that memory has been stable but still has trouble with telling the day.  That is not new.  07/23/16 update:  The patient follows up today, accompanied by his wife who supplements the history.  He has a history of vascular parkinsonism and he is supposed to be on carbidopa/levodopa 25/100, one tablet 3 times per day .  He almost always misses the middle  of the day dose of levodopa despite an alarm.  He denies any falls since last visit.  Has dizziness with standing but no near syncope.  Only drinking 8oz of water per day.  No hallucinations.  He does have history of memory loss, and likely vascular dementia.  He has refused any medication for this.  Memory loss is likely exacerbated by medication such as hydrocodone.  He saw [name] since our last visit and had a nocturnal polysomnogram on 06/04/2016.  His apnea-hypopnea index was 8.1.  She did prescribe clonazepam for periodic limb movements, which she felt was consistent with his diagnosis of vascular parkinsonism.  Wife states that Friday pt saw the NP at PCP  office for crying spells and she put him on neudexta.  Only been on it for 3 days.  He is still on lexapro from [name].  Pt cannot state if crying spells associated with sadness but wife thinks that they are associated with sadness.  11/26/16 update:  The patient was up today, limited by his wife who supplements the history.  The patient is supposed to be carbidopa/levodopa 25/100 at 10 AM/2 PM/6 PM, but generally only takes the first and last dose.  He has had one fall while working on [employer].  He was pulling on the crow bar and fell over while pulling on it.  He didn't get hurt.   Some lightheadedness but no syncope.  His nurse practitioner placed him on Nuedexta and his wife states that they d/c that after our last visit.  He is also on Lexapro and wellbutrin was added by the PCP 2 months ago.  His wife states that crying spells improved but she isn't sure If that is when hallucinations started or not.  She came with a long list of issues that she wrote for me.  Pt thought last week that there was a boy under the bed and got a flashlight to look under the bed to look (this happened at about 4-5 am).  He denies other hallucinations, although his wife rates in her note that he will ask her what they're going to feet all the people at her house.  After awakening from a nap, he is asking for granddaughter who lives in [place] and will state that "she was here earlier."   On one occasion, the patient's wife woke up to find the patient looking for a pistol in the closet and when she asked him what he was doing he stated that he heard someone trying to get into the house.  On another occasion he her dog scratching at the door and when she got up and looked, she stated that she knew he had been up earlier because keys were in the door.  He did attend physical/occupational therapy in November.  I have reviewed those records.  He is no longer driving after an incident in which he was supposed to be driving  himself to therapy and then would go to his primary care physician.  It turns out, that he did go to therapy, but he then went to the dentist and they told him he did not have an appointment.  He never made it to the primary care doctor.  He tried to call his wife but he did not dial the area code, so the call did not go through.  He ultimately ended up at his house several hours later.  Last labs 2 weeks ago due to blood in urine.  Saw urology and has kidney stone.    02/27/17 update:  Patient follows up today with his wife who supplements the history.  Last visit, the patient was having more hallucinations, which seemed to perhaps start after Wellbutrin was added.  We held that and his crying spells did not return and hallucinations seems somewhat better, so we decided to keep him off of that.  He is still on Lexapro.  He does state that xanax 0.25 mg was started at night.  Wife states that they had some extra and he was having trouble sleeping so they restarted it.   He is supposed to be on carbidopa/levodopa 25/100 at 10 AM/2 PM/6 PM but often times only takes the first and last dose of that medication.  He often will miss the middle of the day dose.  I did review his medical records since our last visit.  He had lithotripsy for a kidney stone.  He was also admitted to the hospital in February with urosepsis.  He was much more confused during this period of time.  Wife states that it cleared up some when he came home but not quite back to baseline.  He does keep asking where his granddaughter is.  He is asking who will be with his dog when they leave.  He will ask "what are we going to give these people for dinner" but then when asked if he sees people, he denies it or says he saw someone leaving out the door.  He is doing better with walking.  No falls.  He completed PT last week.    06/30/17 update: Patient seen today in follow-up, accompanied by his wife who supplements the history.  Patient is supposed to  be on carbidopa/levodopa 25/100 3 times per day, and wife states that he is actually doing much better at taking it tid as they are setting a phone alarm.   Last visit, we decided to start nuplazid because of hallucinations.  This is helping him.  He takes 2 tablets at dinner and he is not asking wife nearly as much "who is at the house."  Today he did ask her "if the kids are covered up" but that is pretty rare.  He is actually back to doing projects in the house (painting doors).    10/13/17 update: Patient is seen today in follow-up for parkinsonism.  He is accompanied by his wife who supplements the history.  Patient is on carbidopa/levodopa 25/100, 1 tablet 3 times per day. Wife states that they are still having trouble getting in the last pill of the day even though they have an alarm set.  Wife worries about fact that alarm may ring when they are ready to eat protein.   He remains on Nuplazid for his hallucinations, 34 mg daily.  Pt states that he is doing good.   Wife states that he does ask about "what are we going to give the kids for breakfast" and "are we going to take the kids with [place]."  She doesn't think that he sees the kids, but he asks about kids all the time.  They are not scary.  He tells me today "I saw them earlier today but not now."   Wife states that he has a "fear" of "high places" like their porch, which doesn't have a rail.  He has not had falls.  Uses cane at all times.  He has started reading again for leisure.  He helped to paint his daughters  house.  Still has chronic LBP but helps with aleve.  03/13/18 update: Patient is seen today in follow-up for vascular parkinsonism.  This patient is accompanied in the office by his spouse who supplements the history. He is on carbidopa/levodopa 25/100, 1 tablet 3 times per day.  He is also on Nuplazid for hallucinations.  He will ask his wife near daily about the "kids."  He will ask if his wife is going to feed the "kids."  Usually tells his  wife that he doesn't see the kids but he thought that they were there.  He has had no falls.  He has had no lightheadedness or near syncope.  Records have been reviewed since our last visit, including his updated lab work from November.  Patient saw the vascular surgery nurse practitioner since our last visit for abdominal aortic aneurysm, which slightly increased in size from 4.8cm to 5.0 cm.  He will follow-up with them in 6 months.  Wife c/o weight loss due to poor appetite and asks if due to nuplazid  09/15/18 update: Patient seen in follow-up for vascular parkinsonism, accompanied by his wife who supplements history.  Patient is on carbidopa/levodopa 25/100, 1 tablet 3 times per day.  He is also on Nuplazid for hallucinations. Wife states that hallucinations are better but he seems more confused.  He will sometimes set the table for 4 when there are only 2 in the home.  Sometimes he will think he needs to catch the train to work but he won't argue with his wife but will ask about it.  He will sometimes get lost in the home and go to the laundry room to go to bed instead of heading to bedroom.  Wife states she started giving him hydrocodone and looking back confusion worse since then.  Records are reviewed since her last visit.  The patient was in the hospital in July for lower extremity arteriogram.  He was subsequently sent to interventional radiology for repair/coil embolization of the endovascular leak, but once he got there and had another arteriogram, there was resolution of the endoleak.  ALLERGIES:   Allergies  Allergen Reactions  . Ambien [Zolpidem Tartrate]     Goes wild, gets anxious and confused with ambien    CURRENT MEDICATIONS:  Outpatient Encounter Medications as of 09/15/2018  Medication Sig  . allopurinol (ZYLOPRIM) 300 MG tablet Take 300 mg by mouth every morning.   Marland Kitchen ALPRAZolam (XANAX) 0.5 MG tablet Take 0.5 mg by mouth at bedtime.   Marland Kitchen atorvastatin (LIPITOR) 40 MG tablet Take 1  tablet by mouth at bedtime.   . carbidopa-levodopa (SINEMET IR) 25-100 MG tablet TAKE 1 TABLET 3 (THREE) TIMES DAILY BY MOUTH.  Marland Kitchen escitalopram (LEXAPRO) 10 MG tablet Take 10 mg by mouth every morning.   . finasteride (PROSCAR) 5 MG tablet Take 5 mg daily by mouth.  . fluticasone (FLONASE) 50 MCG/ACT nasal spray Place 1 spray into both nostrils daily as needed for allergies.   Marland Kitchen HYDROcodone-acetaminophen (NORCO/VICODIN) 5-325 MG tablet Take 1 tablet by mouth as needed for moderate pain.  . hydroxypropyl methylcellulose / hypromellose (ISOPTO TEARS / GONIOVISC) 2.5 % ophthalmic solution Place 1 drop into both eyes daily as needed for dry eyes.  Marland Kitchen levothyroxine (SYNTHROID, LEVOTHROID) 25 MCG tablet Take 25 mcg by mouth daily before breakfast.   . nebivolol (BYSTOLIC) 10 MG tablet Take 5 mg by mouth every morning.   Marland Kitchen omeprazole (PRILOSEC) 40 MG capsule Take 40 mg by mouth daily.   Marland Kitchen  Pimavanserin Tartrate (NUPLAZID) 34 MG CAPS Take 1 capsule by mouth daily.  . polyethylene glycol (MIRALAX / GLYCOLAX) packet Take 17 g by mouth daily as needed for moderate constipation.   . [DISCONTINUED] acetaminophen (TYLENOL) 650 MG CR tablet Take 1,300 mg by mouth every morning.   No facility-administered encounter medications on file as of 09/15/2018.     PAST MEDICAL HISTORY:   Past Medical History:  Diagnosis Date  . Anticoagulant long-term use    xarelto-- due to hx PE's   . Chronic constipation   . Chronic low back pain   . DDD (degenerative disc disease), lumbar   . GERD (gastroesophageal reflux disease)   . H/O endovascular stent graft for abdominal aortic aneurysm followed by dr Scot Dock   04/ 2001  s/p infarenal AAA w/ AneuRx graft stent:  last duplex 08-14-2016 repair patent, AAA sac size 4.5cm x 4.72cm (stable), no endoleak detected  . Hiatal hernia   . History of adenomatous polyp of colon    2010-- tubular adenoma and hyperplastic  . History of basal cell carcinoma excision    yrs ago  .  History of BPH   . History of gout    per pt's wife gout stable as of 12-19-2016  . History of pulmonary embolus (PE)    04/ 2011 and 10/ 2014  . History of skin cancer   . Hyperlipidemia   . Hypertension   . Hypothyroidism   . Left ureteral stone   . Mild sleep apnea    per study 06-04-2016 very mild sleep apnea not obstructive , cpap not recommended  . OA (osteoarthritis)   . RBBB (right bundle branch block)   . Vascular dementia ([place])   . Vascular parkinsonism [facility])    neurologist-  dr Kevork Joyce  . Wears hearing aid    bilateral   . Wears partial dentures    upper    PAST SURGICAL HISTORY:   Past Surgical History:  Procedure Laterality Date  . ABDOMINAL AORTIC ENDOVASCULAR STENT GRAFT  04/ 2001   dr Scot Dock   AneuRx graft stent  . ABDOMINAL AORTOGRAM N/A 06/12/2018   Procedure: ABDOMINAL AORTOGRAM;  Surgeon: Angelia Mould, MD;  Location: [place] [facility];  Service: Cardiovascular;  Laterality: N/A;  . CATARACT EXTRACTION W/ INTRAOCULAR LENS  IMPLANT, BILATERAL  2001 approx.  . CHOLECYSTECTOMY OPEN  1980's  . COLONOSCOPY  last one 01/ 2010  . CYSTOSCOPY/RETROGRADE/URETEROSCOPY/STONE EXTRACTION WITH BASKET Left 12/24/2016   Procedure: CYSTOSCOPY/RETROGRADE/URETEROSCOPY/STONE EXTRACTION WITH BASKET HOLMIUM LASER STENT PLACEMENT;  Surgeon: Festus Aloe, MD;  Location: [facility];  Service: Urology;  Laterality: Left;  . EXCISION/RELEASE BURSA HIP Right 07/15/2014   Procedure: RIGHT HIP BURSECTOMY WITH TENDON REPAIR ;  Surgeon: Gearlean Alf, MD;  Location: [facility];  Service: Orthopedics;  Laterality: Right;  . HOLMIUM LASER APPLICATION Left 2/63/7858   Procedure: HOLMIUM LASER APPLICATION;  Surgeon: Festus Aloe, MD;  Location: [facility];  Service: Urology;  Laterality: Left;  . IR ANGIOGRAM PELVIS SELECTIVE OR SUPRASELECTIVE  07/03/2018  . IR ANGIOGRAM PELVIS SELECTIVE OR SUPRASELECTIVE  07/03/2018  . IR RADIOLOGIST EVAL & MGMT   06/24/2018  . IR RADIOLOGIST EVAL & MGMT  09/01/2018  . IR US GUIDE VASC ACCESS LEFT  07/03/2018  . IR US GUIDE VASC ACCESS RIGHT  07/03/2018  . KNEE ARTHROSCOPY Bilateral 1990's-- right x2 ;  left x1  . LOWER EXTREMITY ANGIOGRAM  06/12/2018   Procedure: Lower Extremity Angiogram;  Surgeon:  Angelia Mould, MD;  Location: [place] [facility];  Service: Cardiovascular;;  . POSTERIOR LAMINECTOMY / DECOMPRESSION LUMBAR SPINE  03/24/2002   L2 -- S1  . POSTERIOR LUMBAR FUSION  11/16/2010   L2 -- L4  . SHOULDER OPEN ROTATOR CUFF REPAIR Bilateral lef 11/15/2009/  right 2013 approx  . TONSILLECTOMY  child  . TOTAL KNEE ARTHROPLASTY  right 08-08-2000/  left 01-28-2003  . TRANSURETHRAL RESECTION OF PROSTATE Left 12/24/2016   Procedure: TRANSURETHRAL RESECTION OF THE PROSTATE (TURP);  Surgeon: Festus Aloe, MD;  Location: [facility];  Service: Urology;  Laterality: Left;    SOCIAL HISTORY:   Social History   Socioeconomic History  . Marital status: Married    Spouse name: Not on file  . Number of children: 3  . Years of education: Not on file  . Highest education level: Not on file  Occupational History  . 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 Financial resource strain: Not on file  . Food insecurity:    Worry: Not on file    Inability: Not on file  . Transportation needs:    Medical: Not on file    Non-medical: Not on file  Tobacco Use  . Smoking status: Former Smoker    Years: 20.00    Types: Cigarettes    Last attempt to quit: 10/01/1974    Years since quitting: 43.9  . Smokeless tobacco: Never Used  Substance and Sexual Activity  . Alcohol use: No    Alcohol/week: 0.0 standard drinks  . Drug use: No  . Sexual activity: Not on file  Lifestyle  . Physical activity:    Days per week: Not on file    Minutes per session: Not on file  . Stress: Not on file  Relationships  . Social connections:    Talks on phone: Not  on file    Gets together: Not on file    Attends religious service: Not on file    Active member of club or organization: Not on file    Attends meetings of clubs or organizations: Not on file    Relationship status: Not on file  . Intimate partner violence:    Fear of current or ex partner: Not on file    Emotionally abused: Not on file    Physically abused: Not on file    Forced sexual activity: Not on file  Other Topics Concern  . Not on file  Social History Narrative   Married   2 daughters, 1 son    FAMILY HISTORY:   Family Status  Relation Name Status  . Mother  Deceased       alzheimer dementia  . Father  Deceased       CAD  . Brother  Alive       healthy  . Sister  Deceased       breast CA  . Sister  Alive       DJD  . Child  Alive       3, healthy  . Sister  (Not Specified)  . Brother  (Not Specified)  . Daughter  (Not Specified)    ROS:  Review of Systems  Constitutional: Negative.   HENT: Negative.   Eyes: Negative.   Respiratory: Negative.   Cardiovascular: Negative.   Gastrointestinal: Negative.   Genitourinary: Negative.   Skin: Negative.   Endo/Heme/Allergies: Negative.   Psychiatric/Behavioral: Positive for memory loss.  PHYSICAL EXAMINATION:    VITALS:   Vitals:   09/15/18 1425  BP: (!) 148/70  Pulse: (!) 56  SpO2: 96%  Weight: 194 lb (88 kg)  Height: 6' (1.829 m)   Wt Readings from Last 3 Encounters:  09/15/18 194 lb (88 kg)  09/01/18 197 lb (89.4 kg)  07/03/18 200 lb (90.7 kg)     GEN:  The patient appears stated age and is in NAD. HEENT:  Normocephalic, atraumatic.  The mucous membranes are moist. The superficial temporal arteries are without ropiness or tenderness. CV:  Bradycardic.  Regular rhythm Lungs:  CTAB Neck/HEME:  There are no carotid bruits bilaterally.  Neurological examination:  Orientation: He is alert and oriented x2.  He does remember specific details about the examiner and asks several questions that  are appropriate (same as previous visit). Montreal Cognitive Assessment  03/21/2016  Visuospatial/ Executive (0/5) 1  Naming (0/3) 2  Attention: Read list of digits (0/2) 2  Attention: Read list of letters (0/1) 1  Attention: Serial 7 subtraction starting at 100 (0/3) 1  Language: Repeat phrase (0/2) 1  Language : Fluency (0/1) 1  Abstraction (0/2) 1  Delayed Recall (0/5) 1  Orientation (0/6) 3  Total 14  Adjusted Score (based on education) 15    Cranial nerves: There is good facial symmetry. The speech is fluent and clear. Soft palate rises symmetrically and there is no tongue deviation. Hearing is intact to conversational tone. Sensation: Sensation is intact to light touch throughout Motor: Strength is 5/5 in the bilateral upper and lower extremities.   Shoulder shrug is equal and symmetric.  There is no pronator drift.   Movement examination: Tone: There is normal tone in the bilateral upper extremities.  The tone in the lower extremities is normal.  Abnormal movements: There is mild intermittent resting tremor on the right Coordination:  There is slowing with alternation of supination/pronation of the forearm on the L.   Gait and Station: The patient pushes off of the chair to arise.  He is slow.  He drags the left leg somewhat.    labs:  Lab work received from primary care physician dated October 20, 2017.  Sodium was 141, potassium 4.1, chloride 104, CO2 29, BUN 15 and creatinine 0.9.  White blood cells were 6.8, hemoglobin 13.8, hematocrit 42.0 and platelets 146.  Total cholesterol is 134, HDL 38, LDL 81, TSH 1.26  ASSESSMENT/PLAN:  1.  Parkinsonism  -I suspect that this represents vascular parkinsonism.   Continue carbidopa/levodopa 25/100, 1 tablet 3 times per day.  Missing middle of the day dose as they are trying to avoid protein.  Told to take it anyway and worry less about meals.  He wakes up late in the day as it is. 2.  Dementia with hallucinations  -no longer able  to do moca  -on nuplazid and helped but still having confusion  -d/c hydrocodone as making confusion worse.  Leery to try aricept as already bradycardic. 3.  Periodic limb movement disorder and very mild sleep apnea  -The patient saw [name] and had a sleep study on 06/04/2016.  AHI was only 8.1 and CPAP was not recommended.  The patient should sleep in the side-lying position.  She did recommend low-dose clonazepam at night. 4.  LBP  -The patient has seen Dr. Carloyn Manner and Dr. Nelva Bush along with chiropractics.  While I did see he had an MRI of his lumbar spine in 2014 that demonstrated moderate to severe neural  foraminal stenosis at L4-L5 and significant degenerative changes, they are not interested in further surgery or injections and have failed alternative (chiropractic) tx. He is taking tylenol for pain 5.  Depression  -on Lexapro, 10 mg daily  -Off Wellbutrin, which increased hallucinations. 6.  Weight loss  -still losing weight due to appetite loss.  PCP and I have both suggested 2 of supplemental shakes.  Discussed this again today 7. Dizziness  -monitor BP.    -encouraged hydration. 8.  Follow up is anticipated in the next few months, sooner should new neurologic issues arise.  Much greater than 50% of this visit was spent in counseling and coordinating care.  Total face to face time:  25 min

## 2018-09-12 DIAGNOSIS — Z23 Encounter for immunization: Secondary | ICD-10-CM | POA: Diagnosis not present

## 2018-09-14 ENCOUNTER — Ambulatory Visit: Payer: PPO | Admitting: Neurology

## 2018-09-15 ENCOUNTER — Ambulatory Visit: Payer: PPO | Admitting: Neurology

## 2018-09-15 ENCOUNTER — Encounter: Payer: Self-pay | Admitting: Neurology

## 2018-09-15 VITALS — BP 148/70 | HR 56 | Ht 72.0 in | Wt 194.0 lb

## 2018-09-15 DIAGNOSIS — R42 Dizziness and giddiness: Secondary | ICD-10-CM | POA: Diagnosis not present

## 2018-09-15 DIAGNOSIS — F0150 Vascular dementia without behavioral disturbance: Secondary | ICD-10-CM | POA: Diagnosis not present

## 2018-09-15 DIAGNOSIS — R634 Abnormal weight loss: Secondary | ICD-10-CM

## 2018-09-15 NOTE — Patient Instructions (Signed)
1.  Drink water and plenty of it!  It will help your dizziness.  Also, check BP in the evening when dizzy and let me know what that is. 2.  Stop hydrocodone 3.  Continue carbidopa/levodopa 25/100, 1 tablet at 10am/2pm/6pm 4.  Use protein shakes twice per day 5.  Exercise!

## 2018-10-13 ENCOUNTER — Ambulatory Visit: Payer: PPO | Admitting: Podiatry

## 2018-10-21 DIAGNOSIS — R82998 Other abnormal findings in urine: Secondary | ICD-10-CM | POA: Diagnosis not present

## 2018-10-21 DIAGNOSIS — Z125 Encounter for screening for malignant neoplasm of prostate: Secondary | ICD-10-CM | POA: Diagnosis not present

## 2018-10-21 DIAGNOSIS — E038 Other specified hypothyroidism: Secondary | ICD-10-CM | POA: Diagnosis not present

## 2018-10-21 DIAGNOSIS — M1A9XX0 Chronic gout, unspecified, without tophus (tophi): Secondary | ICD-10-CM | POA: Diagnosis not present

## 2018-10-21 DIAGNOSIS — I10 Essential (primary) hypertension: Secondary | ICD-10-CM | POA: Diagnosis not present

## 2018-10-21 DIAGNOSIS — R7302 Impaired glucose tolerance (oral): Secondary | ICD-10-CM | POA: Diagnosis not present

## 2018-10-23 ENCOUNTER — Ambulatory Visit: Payer: PPO | Admitting: Podiatry

## 2018-10-23 ENCOUNTER — Encounter: Payer: Self-pay | Admitting: Podiatry

## 2018-10-23 VITALS — BP 176/84 | HR 57 | Resp 16

## 2018-10-23 DIAGNOSIS — M79674 Pain in right toe(s): Secondary | ICD-10-CM | POA: Diagnosis not present

## 2018-10-23 DIAGNOSIS — B351 Tinea unguium: Secondary | ICD-10-CM

## 2018-10-23 DIAGNOSIS — M79675 Pain in left toe(s): Secondary | ICD-10-CM

## 2018-10-28 DIAGNOSIS — R7302 Impaired glucose tolerance (oral): Secondary | ICD-10-CM | POA: Diagnosis not present

## 2018-10-28 DIAGNOSIS — M1A9XX0 Chronic gout, unspecified, without tophus (tophi): Secondary | ICD-10-CM | POA: Diagnosis not present

## 2018-10-28 DIAGNOSIS — E7849 Other hyperlipidemia: Secondary | ICD-10-CM | POA: Diagnosis not present

## 2018-10-28 DIAGNOSIS — G20 Parkinson's disease: Secondary | ICD-10-CM | POA: Diagnosis not present

## 2018-10-28 DIAGNOSIS — E038 Other specified hypothyroidism: Secondary | ICD-10-CM | POA: Diagnosis not present

## 2018-10-28 DIAGNOSIS — N401 Enlarged prostate with lower urinary tract symptoms: Secondary | ICD-10-CM | POA: Diagnosis not present

## 2018-10-28 DIAGNOSIS — G629 Polyneuropathy, unspecified: Secondary | ICD-10-CM | POA: Diagnosis not present

## 2018-10-28 DIAGNOSIS — I10 Essential (primary) hypertension: Secondary | ICD-10-CM | POA: Diagnosis not present

## 2018-10-28 DIAGNOSIS — Z0000 Encounter for general adult medical examination without abnormal findings: Secondary | ICD-10-CM | POA: Diagnosis not present

## 2018-10-28 DIAGNOSIS — Z6826 Body mass index (BMI) 26.0-26.9, adult: Secondary | ICD-10-CM | POA: Diagnosis not present

## 2018-10-28 DIAGNOSIS — G4730 Sleep apnea, unspecified: Secondary | ICD-10-CM | POA: Diagnosis not present

## 2018-10-28 DIAGNOSIS — M4726 Other spondylosis with radiculopathy, lumbar region: Secondary | ICD-10-CM | POA: Diagnosis not present

## 2018-10-31 ENCOUNTER — Other Ambulatory Visit: Payer: Self-pay | Admitting: Neurology

## 2018-11-09 ENCOUNTER — Encounter: Payer: Self-pay | Admitting: Podiatry

## 2018-11-09 NOTE — Progress Notes (Signed)
Subjective: Patient presents to clinic with cc of  painful, thick, discolored, elongated toenails 1-5 b/l that become tender and cannot cut because of thickness.  Christian Wiley voices no new pedal complaints on today's visit.  Objective:  Physical Examination: Neurovascular status intact with thick, discolored brittle toenails 1-5 b/l  Assessment: Mycotic nail infection with pain 1-5 b/l  Plan:  Debride painful toenails 1-5 b/l with no iatrogenic bleeding. Follow up 3 months.

## 2018-12-21 ENCOUNTER — Telehealth: Payer: Self-pay | Admitting: Neurology

## 2018-12-21 MED ORDER — PIMAVANSERIN TARTRATE 34 MG PO CAPS: 1.0000 | ORAL_CAPSULE | Freq: Every day | ORAL | 1 refills | Status: DC

## 2018-12-21 NOTE — Telephone Encounter (Signed)
Patient's wife is calling in about the nuplazid medication coming from [facility] and that they are needing a new prescription for this. Please send that in. Thanks!

## 2018-12-21 NOTE — Telephone Encounter (Signed)
RX sent to pharmacy  

## 2018-12-28 ENCOUNTER — Ambulatory Visit: Payer: Self-pay

## 2018-12-28 DIAGNOSIS — B351 Tinea unguium: Secondary | ICD-10-CM

## 2018-12-28 DIAGNOSIS — D689 Coagulation defect, unspecified: Secondary | ICD-10-CM

## 2018-12-28 DIAGNOSIS — M79675 Pain in left toe(s): Secondary | ICD-10-CM

## 2018-12-28 DIAGNOSIS — M79674 Pain in right toe(s): Secondary | ICD-10-CM

## 2019-01-04 NOTE — Progress Notes (Signed)
HPI Presents today chief complaint of painful elongated toenails 1 through 5 bilateral  Objective: Pulses are palpable bilateral nails are thick, yellow dystrophic onychomycosis and painful palpation.   Assessment: Onychomycosis with pain in limb.  Plan: Treatment of nails in thickness and length as covered service secondary to pain.  Patient is to follow-up in 3 months at regularly scheduled intervals for routine foot care.

## 2019-01-18 NOTE — Progress Notes (Signed)
Christian Wiley was seen today in the movement disorders clinic for neurologic consultation at the request of Christian Bunting, MD.  The consultation is for the evaluation of gait change and to r/o PD.  This patient is accompanied in the office by his spouse who supplements the history.   Pt states that he has been seen by his chiropractor, Christian Wiley, for LBP and she recommended he get evaluated by a neurologist as she thought he had parkinsonian features.    09/19/15 update:  Pt returns for f/u accompanied by his wife who supplements the hx.  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  We decided to give him a trial of levodopa.  Today, the patient states that he is only taking it bid instead of the prescribed tid.  He is usually taking it at 10am and 5pm.  Tremor is improved.   He did have an MRI of the brain without gadolinium on 06/28/2015.  There was no significant change compared to his prior MRI of the brain in 2007.  There is evidence of mild small vessel disease.  PT was refused last visit.  He is c/o fatigue.  Has taken his carbidopa/levodopa 25/100 this AM but has not taken his bystolic.  12/21/15 update:  The patient is following up today, accompanied by his wife who supplements the history.  The patient has history of vascular parkinsonism.  Last visit, he was only taking his levodopa twice per day, and I encouraged him to take it 3 times per day as directed.  His wife states that he still doesn't get his midday dose in time.  He was supposed to be taking the medication at 10am (wake up time)/2 pm/6pm.  He has not wanted to attend physical therapy, but continues to exercise at the gym.  He has not had any falls, but does admit that he "slid" out of the bed trying to get out (got new higher mattress).  No hallucinations.  He has had episodes of confusion and will sometimes ask his wife, "who will keep the kids."  He stopped xanax about a week ago and confusion seems  better.  He takes 2 hydrocodone at bedtime for sleep.  That hasn't helped sleep but he is resistant to giving up the medication. No lightheadedness or near syncope.  03/21/16 update:  The patient follows up today, accompanied by his wife who supplements the history.  The patient has a history of vascular parkinsonism.  Last visit, I again encouraged the patient to take his carbidopa/levodopa 3 times per day as opposed to the twice per day that he was previously taking it.  He did set an alarm but is still inconsistent with that.   He did attend physical therapy since our last visit, and I congratulated him on that.  Last visit, we talked about memory loss and I thought that medications contribute, particularly the hydrocodone and I encouraged him to decrease that to 1 tablet per night and add melatonin for sleep.  He did decrease the hydrocodone.  The melatonin didn't help him sleep.  Wife states that memory has been stable but still has trouble with telling the day.  That is not new.  07/23/16 update:  The patient follows up today, accompanied by his wife who supplements the history.  He has a history of vascular parkinsonism and he is supposed to be on carbidopa/levodopa 25/100, one tablet 3 times per day .  He almost always misses the middle  of the day dose of levodopa despite an alarm.  He denies any falls since last visit.  Has dizziness with standing but no near syncope.  Only drinking 8oz of water per day.  No hallucinations.  He does have history of memory loss, and likely vascular dementia.  He has refused any medication for this.  Memory loss is likely exacerbated by medication such as hydrocodone.  He saw Christian Wiley since our last visit and had a nocturnal polysomnogram on 06/04/2016.  His apnea-hypopnea index was 8.1.  She did prescribe clonazepam for periodic limb movements, which she felt was consistent with his diagnosis of vascular parkinsonism.  Wife states that Friday pt saw the NP at PCP  office for crying spells and she put him on neudexta.  Only been on it for 3 days.  He is still on lexapro from Christian Wiley.  Pt cannot state if crying spells associated with sadness but wife thinks that they are associated with sadness.  11/26/16 update:  The patient was up today, limited by his wife who supplements the history.  The patient is supposed to be carbidopa/levodopa 25/100 at 10 AM/2 PM/6 PM, but generally only takes the first and last dose.  He has had one fall while working on [employer].  He was pulling on the crow bar and fell over while pulling on it.  He didn't get hurt.   Some lightheadedness but no syncope.  His nurse practitioner placed him on Nuedexta and his wife states that they d/c that after our last visit.  He is also on Lexapro and wellbutrin was added by the PCP 2 months ago.  His wife states that crying spells improved but she isn't sure If that is when hallucinations started or not.  She came with a long list of issues that she wrote for me.  Pt thought last week that there was a boy under the bed and got a flashlight to look under the bed to look (this happened at about 4-5 am).  He denies other hallucinations, although his wife rates in her note that he will ask her what they're going to feet all the people at her house.  After awakening from a nap, he is asking for granddaughter who lives in [place] and will state that "she was here earlier."   On one occasion, the patient's wife woke up to find the patient looking for a pistol in the closet and when she asked him what he was doing he stated that he heard someone trying to get into the house.  On another occasion he her dog scratching at the door and when she got up and looked, she stated that she knew he had been up earlier because keys were in the door.  He did attend physical/occupational therapy in November.  I have reviewed those records.  He is no longer driving after an incident in which he was supposed to be driving  himself to therapy and then would go to his primary care physician.  It turns out, that he did go to therapy, but he then went to the dentist and they told him he did not have an appointment.  He never made it to the primary care doctor.  He tried to call his wife but he did not dial the area code, so the call did not go through.  He ultimately ended up at his house several hours later.  Last labs 2 weeks ago due to blood in urine.  Saw urology and has kidney stone.    02/27/17 update:  Patient follows up today with his wife who supplements the history.  Last visit, the patient was having more hallucinations, which seemed to perhaps start after Wellbutrin was added.  We held that and his crying spells did not return and hallucinations seems somewhat better, so we decided to keep him off of that.  He is still on Lexapro.  He does state that xanax 0.25 mg was started at night.  Wife states that they had some extra and he was having trouble sleeping so they restarted it.   He is supposed to be on carbidopa/levodopa 25/100 at 10 AM/2 PM/6 PM but often times only takes the first and last dose of that medication.  He often will miss the middle of the day dose.  I did review his medical records since our last visit.  He had lithotripsy for a kidney stone.  He was also admitted to the hospital in February with urosepsis.  He was much more confused during this period of time.  Wife states that it cleared up some when he came home but not quite back to baseline.  He does keep asking where his granddaughter is.  He is asking who will be with his dog when they leave.  He will ask "what are we going to give these people for dinner" but then when asked if he sees people, he denies it or says he saw someone leaving out the door.  He is doing better with walking.  No falls.  He completed PT last week.    06/30/17 update: Patient seen today in follow-up, accompanied by his wife who supplements the history.  Patient is supposed to  be on carbidopa/levodopa 25/100 3 times per day, and wife states that he is actually doing much better at taking it tid as they are setting a phone alarm.   Last visit, we decided to start nuplazid because of hallucinations.  This is helping him.  He takes 2 tablets at dinner and he is not asking wife nearly as much "who is at the house."  Today he did ask her "if the kids are covered up" but that is pretty rare.  He is actually back to doing projects in the house (painting doors).    10/13/17 update: Patient is seen today in follow-up for parkinsonism.  He is accompanied by his wife who supplements the history.  Patient is on carbidopa/levodopa 25/100, 1 tablet 3 times per day. Wife states that they are still having trouble getting in the last pill of the day even though they have an alarm set.  Wife worries about fact that alarm may ring when they are ready to eat protein.   He remains on Nuplazid for his hallucinations, 34 mg daily.  Pt states that he is doing good.   Wife states that he does ask about "what are we going to give the kids for breakfast" and "are we going to take the kids with [place]."  She doesn't think that he sees the kids, but he asks about kids all the time.  They are not scary.  He tells me today "I saw them earlier today but not now."   Wife states that he has a "fear" of "high places" like their porch, which doesn't have a rail.  He has not had falls.  Uses cane at all times.  He has started reading again for leisure.  He helped to paint his daughters  house.  Still has chronic LBP but helps with aleve.  03/13/18 update: Patient is seen today in follow-up for vascular parkinsonism.  This patient is accompanied in the office by his spouse who supplements the history. He is on carbidopa/levodopa 25/100, 1 tablet 3 times per day.  He is also on Nuplazid for hallucinations.  He will ask his wife near daily about the "kids."  He will ask if his wife is going to feed the "kids."  Usually tells his  wife that he doesn't see the kids but he thought that they were there.  He has had no falls.  He has had no lightheadedness or near syncope.  Records have been reviewed since our last visit, including his updated lab work from November.  Patient saw the vascular surgery nurse practitioner since our last visit for abdominal aortic aneurysm, which slightly increased in size from 4.8cm to 5.0 cm.  He will follow-up with them in 6 months.  Wife c/o weight loss due to poor appetite and asks if due to nuplazid  09/15/18 update: Patient seen in follow-up for vascular parkinsonism, accompanied by his wife who supplements history.  Patient is on carbidopa/levodopa 25/100, 1 tablet 3 times per day.  He is also on Nuplazid for hallucinations. Wife states that hallucinations are better but he seems more confused.  He will sometimes set the table for 4 when there are only 2 in the home.  Sometimes he will think he needs to catch the train to work but he won't argue with his wife but will ask about it.  He will sometimes get lost in the home and go to the laundry room to go to bed instead of heading to bedroom.  Wife states she started giving him hydrocodone and looking back confusion worse since then.  Records are reviewed since her last visit.  The patient was in the hospital in July for lower extremity arteriogram.  He was subsequently sent to interventional radiology for repair/coil embolization of the endovascular leak, but once he got there and had another arteriogram, there was resolution of the endoleak.  01/20/19 update: Patient is seen today in follow-up for vascular parkinsonism.  The patient is accompanied by his wife who supplements the history.  Patient is on carbidopa/levodopa 25/100, 1 tablet 3 times per day.  He also is on Nuplazid for hallucinations.  Last visit, we discussed trying to taper off of the hydrocodone, as that was making confusion worse.  They state that they have done that.  However, wife wrote  me a 2 page letter that she had me read prior to coming into the room.  She felt she could not be forthright in front of the patient.  Basically, the patient has been having hallucinations, which she admits to today.  He also has been having some delusional behavior.  His wife writes "he thinks he saw me in bed with him (referring to a sailor boy) and 1 night this week he got up to see if ' sailor boy' was screwing his daughter."  He has not been aggressive, but has gotten "angry and sometimes he is really mean."  Wife also writes "when I go to the mailbox he thinks I am talking to the neighbor man across the street and actually sees him (no one actually there)."  ALLERGIES:   Allergies  Allergen Reactions  . Ambien [Zolpidem Tartrate]     Goes wild, gets anxious and confused with [place]    CURRENT MEDICATIONS:  Outpatient  Encounter Medications as of 01/20/2019  Medication Sig  . allopurinol (ZYLOPRIM) 300 MG tablet Take 300 mg by mouth every morning.   Marland Kitchen ALPRAZolam (XANAX) 0.5 MG tablet Take 0.5 mg by mouth at bedtime.   Marland Kitchen atorvastatin (LIPITOR) 40 MG tablet Take 1 tablet by mouth at bedtime.   . carbidopa-levodopa (SINEMET IR) 25-100 MG tablet TAKE 1 TABLET 3 (THREE) TIMES DAILY BY MOUTH.  Marland Kitchen escitalopram (LEXAPRO) 10 MG tablet Take 10 mg by mouth every morning.   . finasteride (PROSCAR) 5 MG tablet Take 5 mg daily by mouth.  . hydroxypropyl methylcellulose / hypromellose (ISOPTO TEARS / GONIOVISC) 2.5 % ophthalmic solution Place 1 drop into both eyes daily as needed for dry eyes.  Marland Kitchen levothyroxine (SYNTHROID, LEVOTHROID) 25 MCG tablet Take 25 mcg by mouth daily before breakfast.   . naproxen sodium (ALEVE) 220 MG tablet Take 220 mg by mouth daily as needed.  . nebivolol (BYSTOLIC) 10 MG tablet Take 5 mg by mouth every morning.   Marland Kitchen omeprazole (PRILOSEC) 40 MG capsule Take 40 mg by mouth daily.   Marland Kitchen Pimavanserin Tartrate (NUPLAZID) 34 MG CAPS Take 1 capsule by mouth daily.  . polyethylene glycol  (MIRALAX / GLYCOLAX) packet Take 17 g by mouth daily as needed for moderate constipation.   . [DISCONTINUED] fluticasone (FLONASE) 50 MCG/ACT nasal spray Place 1 spray into both nostrils daily as needed for allergies.   . [DISCONTINUED] HYDROcodone-acetaminophen (NORCO/VICODIN) 5-325 MG tablet Take 1 tablet by mouth as needed for moderate pain.   No facility-administered encounter medications on file as of 01/20/2019.     PAST MEDICAL HISTORY:   Past Medical History:  Diagnosis Date  . Anticoagulant long-term use    xarelto-- due to hx PE's   . Chronic constipation   . Chronic low back pain   . DDD (degenerative disc disease), lumbar   . GERD (gastroesophageal reflux disease)   . H/O endovascular stent graft for abdominal aortic aneurysm followed by dr Scot Dock   04/ 2001  s/p infarenal AAA w/ AneuRx graft stent:  last duplex 08-14-2016 repair patent, AAA sac size 4.5cm x 4.72cm (stable), no endoleak detected  . Hiatal hernia   . History of adenomatous polyp of colon    2010-- tubular adenoma and hyperplastic  . History of basal cell carcinoma excision    yrs ago  . History of BPH   . History of gout    per pt's wife gout stable as of 12-19-2016  . History of pulmonary embolus (PE)    04/ 2011 and 10/ 2014  . History of skin cancer   . Hyperlipidemia   . Hypertension   . Hypothyroidism   . Left ureteral stone   . Mild sleep apnea    per study 06-04-2016 very mild sleep apnea not obstructive , cpap not recommended  . OA (osteoarthritis)   . RBBB (right bundle branch block)   . Vascular dementia ([place])   . Vascular parkinsonism [facility])    neurologist-  dr tat  . Wears hearing aid    bilateral   . Wears partial dentures    upper    PAST SURGICAL HISTORY:   Past Surgical History:  Procedure Laterality Date  . ABDOMINAL AORTIC ENDOVASCULAR STENT GRAFT  04/ 2001   dr Scot Dock   AneuRx graft stent  . ABDOMINAL AORTOGRAM N/A 06/12/2018   Procedure: ABDOMINAL AORTOGRAM;  Surgeon:  Angelia Mould, MD;  Location: [place] [facility];  Service: Cardiovascular;  Laterality: N/A;  .  CATARACT EXTRACTION W/ INTRAOCULAR LENS  IMPLANT, BILATERAL  2001 approx.  . CHOLECYSTECTOMY OPEN  1980's  . COLONOSCOPY  last one 01/ 2010  . CYSTOSCOPY/RETROGRADE/URETEROSCOPY/STONE EXTRACTION WITH BASKET Left 12/24/2016   Procedure: CYSTOSCOPY/RETROGRADE/URETEROSCOPY/STONE EXTRACTION WITH BASKET HOLMIUM LASER STENT PLACEMENT;  Surgeon: Festus Aloe, MD;  Location: [facility];  Service: Urology;  Laterality: Left;  . EXCISION/RELEASE BURSA HIP Right 07/15/2014   Procedure: RIGHT HIP BURSECTOMY WITH TENDON REPAIR ;  Surgeon: Gearlean Alf, MD;  Location: [facility];  Service: Orthopedics;  Laterality: Right;  . HOLMIUM LASER APPLICATION Left 1/61/0960   Procedure: HOLMIUM LASER APPLICATION;  Surgeon: Festus Aloe, MD;  Location: [facility];  Service: Urology;  Laterality: Left;  . IR ANGIOGRAM PELVIS SELECTIVE OR SUPRASELECTIVE  07/03/2018  . IR ANGIOGRAM PELVIS SELECTIVE OR SUPRASELECTIVE  07/03/2018  . IR RADIOLOGIST EVAL & MGMT  06/24/2018  . IR RADIOLOGIST EVAL & MGMT  09/01/2018  . IR US GUIDE VASC ACCESS LEFT  07/03/2018  . IR US GUIDE VASC ACCESS RIGHT  07/03/2018  . KNEE ARTHROSCOPY Bilateral 1990's-- right x2 ;  left x1  . LOWER EXTREMITY ANGIOGRAM  06/12/2018   Procedure: Lower Extremity Angiogram;  Surgeon: Angelia Mould, MD;  Location: [place] [facility];  Service: Cardiovascular;;  . POSTERIOR LAMINECTOMY / DECOMPRESSION LUMBAR SPINE  03/24/2002   L2 -- S1  . POSTERIOR LUMBAR FUSION  11/16/2010   L2 -- L4  . SHOULDER OPEN ROTATOR CUFF REPAIR Bilateral lef 11/15/2009/  right 2013 approx  . TONSILLECTOMY  child  . TOTAL KNEE ARTHROPLASTY  right 08-08-2000/  left 01-28-2003  . TRANSURETHRAL RESECTION OF PROSTATE Left 12/24/2016   Procedure: TRANSURETHRAL RESECTION OF THE PROSTATE (TURP);  Surgeon: Festus Aloe, MD;  Location: [facility];  Service: Urology;  Laterality: Left;    SOCIAL HISTORY:   Social History   Socioeconomic History  . Marital status: Married    Spouse name: Not on file  . Number of children: 3  . Years of education: Not on file  . Highest education level: Not on file  Occupational History  . 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 Financial resource strain: Not on file  . Food insecurity:    Worry: Not on file    Inability: Not on file  . Transportation needs:    Medical: Not on file    Non-medical: Not on file  Tobacco Use  . Smoking status: Former Smoker    Years: 20.00    Types: Cigarettes    Last attempt to quit: 10/01/1974    Years since quitting: 44.3  . Smokeless tobacco: Never Used  Substance and Sexual Activity  . Alcohol use: No    Alcohol/week: 0.0 standard drinks  . Drug use: No  . Sexual activity: Not on file  Lifestyle  . Physical activity:    Days per week: Not on file    Minutes per session: Not on file  . Stress: Not on file  Relationships  . Social connections:    Talks on phone: Not on file    Gets together: Not on file    Attends religious service: Not on file    Active member of club or organization: Not on file    Attends meetings of clubs or organizations: Not on file    Relationship status: Not on file  . Intimate partner violence:    Fear of current or ex  partner: Not on file    Emotionally abused: Not on file    Physically abused: Not on file    Forced sexual activity: Not on file  Other Topics Concern  . Not on file  Social History Narrative   Married   2 daughters, 1 son    FAMILY HISTORY:   Family Status  Relation Name Status  . Mother  Deceased       alzheimer dementia  . Father  Deceased       CAD  . Brother  Alive       healthy  . Sister  Deceased       breast CA  . Sister  Alive       DJD  . Child  Alive       3, healthy  . Sister  (Not Specified)  . Brother  (Not  Specified)  . Daughter  (Not Specified)    ROS:  Review of Systems  Constitutional: Negative.   HENT: Negative.   Eyes: Negative.   Respiratory: Negative.   Cardiovascular: Negative.   Gastrointestinal: Negative.   Skin: Negative.   Psychiatric/Behavioral: Positive for hallucinations.    PHYSICAL EXAMINATION:    VITALS:   Vitals:   01/20/19 1100  BP: 114/60  Pulse: 64  SpO2: 98%  Weight: 197 lb (89.4 kg)  Height: 6' (1.829 m)   Wt Readings from Last 3 Encounters:  01/20/19 197 lb (89.4 kg)  09/15/18 194 lb (88 kg)  09/01/18 197 lb (89.4 kg)     GEN:  The patient appears stated age and is in NAD. HEENT:  Normocephalic, atraumatic.  The mucous membranes are moist. The superficial temporal arteries are without ropiness or tenderness. CV: Regular rate rhythm Lungs:  CTAB Neck/HEME:  There are no carotid bruits bilaterally.  Neurological examination:  Orientation: He is alert and oriented to person and place.  While he recognizes the examiner, he does say inappropriate comments to her [facility] Cognitive Assessment  03/21/2016  Visuospatial/ Executive (0/5) 1  Naming (0/3) 2  Attention: Read list of digits (0/2) 2  Attention: Read list of letters (0/1) 1  Attention: Serial 7 subtraction starting at 100 (0/3) 1  Language: Repeat phrase (0/2) 1  Language : Fluency (0/1) 1  Abstraction (0/2) 1  Delayed Recall (0/5) 1  Orientation (0/6) 3  Total 14  Adjusted Score (based on education) 15    Cranial nerves: There is good facial symmetry. The speech is fluent and clear.  He is hypophonic.  Soft palate rises symmetrically and there is no tongue deviation. Hearing is intact to conversational tone. Sensation: Sensation is intact to light touch throughout Motor: Strength is 5/5 in the bilateral upper and lower extremities.   Shoulder shrug is equal and symmetric.  There is no pronator drift.   Movement examination: Tone: There is normal tone in the bilateral upper  extremities.  The tone in the lower extremities is normal.  Abnormal movements: There is mild intermittent resting tremor on the right (same as last visit) Coordination:  There is slowing with alternation of supination/pronation of the forearm on the L.   Gait and Station: The patient pushes off of the chair to arise.  He is slow but walks fairly well down the hall today.  labs:  Lab work received from primary care physician dated October 20, 2017.  Sodium was 141, potassium 4.1, chloride 104, CO2 29, BUN 15 and creatinine 0.9.  White blood cells were 6.8, hemoglobin 13.8,  hematocrit 42.0 and platelets 146.  Total cholesterol is 134, HDL 38, LDL 81, TSH 1.26  ASSESSMENT/PLAN:  1.  Parkinsonism  -I suspect that this represents vascular parkinsonism.  Given that this likely is vascular parkinsonism, and he is having more hallucinations, decided to go ahead and try and taper him off of this.  Wife asked about differences between vascular parkinsonism and idiopathic Parkinson's disease and discussed this in detail today.  -Gave patients wife information [facility] and encouraged her to check this out before next visit. 2.  Dementia with hallucinations  -no longer able to do moca  -on nuplazid and helped but still having confusion  -Going to taper him off of levodopa, as above.  If still having confusion/hallucinations, will consider adding quetiapine, but would need to check EKG for QT interval.  Would also consider Depakote, given that this could help behavior/agitation. 3.  Periodic limb movement disorder and very mild sleep apnea  -The patient saw Christian Wiley and had a sleep study on 06/04/2016.  AHI was only 8.1 and CPAP was not recommended.  The patient should sleep in the side-lying position.  She did recommend low-dose clonazepam at night. 4.  LBP  -The patient has seen Dr. Carloyn Manner and Dr. Nelva Bush along with chiropractics.  While I did see he had an MRI of his lumbar spine in  2014 that demonstrated moderate to severe neural foraminal stenosis at L4-L5 and significant degenerative changes, they are not interested in further surgery or injections and have failed alternative (chiropractic) tx. He is taking tylenol for pain 5.  Depression  -on Lexapro, 10 mg daily  -Off Wellbutrin, which increased hallucinations. 6.  Weight loss  -Stable since last visit.  Wife states that he is eating chocolate ice cream. 7.  I will plan on seeing him in 8 weeks, sooner should new neurologic issues arise.  Much greater than 50% of this visit was spent in counseling and coordinating care.  Total face to face time:  30 min

## 2019-01-20 ENCOUNTER — Encounter: Payer: Self-pay | Admitting: Neurology

## 2019-01-20 ENCOUNTER — Other Ambulatory Visit: Payer: Self-pay | Admitting: Neurology

## 2019-01-20 ENCOUNTER — Ambulatory Visit: Payer: PPO | Admitting: Neurology

## 2019-01-20 VITALS — BP 114/60 | HR 64 | Ht 72.0 in | Wt 197.0 lb

## 2019-01-20 DIAGNOSIS — G214 Vascular parkinsonism: Secondary | ICD-10-CM | POA: Diagnosis not present

## 2019-01-20 DIAGNOSIS — R441 Visual hallucinations: Secondary | ICD-10-CM | POA: Diagnosis not present

## 2019-01-20 DIAGNOSIS — F0151 Vascular dementia with behavioral disturbance: Secondary | ICD-10-CM

## 2019-01-20 DIAGNOSIS — F01518 Vascular dementia, unspecified severity, with other behavioral disturbance: Secondary | ICD-10-CM

## 2019-01-20 NOTE — Patient Instructions (Signed)
1. Decrease Carbidopa Levodopa to one tablet twice daily for one week, then one tablet daily for one week, then stop.

## 2019-03-01 ENCOUNTER — Ambulatory Visit: Payer: PPO

## 2019-03-15 NOTE — Progress Notes (Signed)
Virtual Visit via Phone Note (scheduled for video visit but pt was unable and converted to phone) The purpose of this virtual visit is to provide medical care while limiting exposure to the novel coronavirus.    Consent was obtained for phone visit:  Yes.   Answered questions that patient had about telephone interaction:  Yes.   I discussed the limitations, risks, security and privacy concerns of performing an evaluation and management service by telephone. I also discussed with the patient that there may be a patient responsible charge related to this service. The patient expressed understanding and agreed to proceed.  Pt location: Home Physician Location: office Name of referring provider:  [name], MD I connected with Christian Wiley at patients initiation/request on 03/16/2019 at  1:00 PM EDT  and verified that I am speaking with the correct person using two identifiers. Pt MRN:  [id number] Pt DOB:  Aug 30, 1935 Participants:  Marissa Nestle Barish;  wife   History of Present Illness:  Patient is seen today in follow-up.  Wife provides most of the hx, but patient occasionally chimes in.  Last visit, I tapered the patient off of levodopa.  They noticed no problems going off of the medication, except tremor may be a little worse in the R hand.   I did not think it was particularly helpful, and he was becoming more confused.  I asked the patient's [facility] to [facility] into [facility], although with the coronavirus, that is currently not an option.  Wife did talk with the program and they have her name and will call her when they are back open after the pandemic is over.  Delusions may be a little better.  Wife states that they have "really good days and really horrible days."  On those days, he is confused and agitated and aggressive.  May have some hallucinations, but wife states that is not a "big problem."  Biggest problem is confusion especially late in the day.  Wife asks me about  potentially getting off of the Xanax as she read that it was "not good for older people."  She also asks me about if Prevagen would help him.  She notes that he has trouble finding the bathroom in his own home.   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  Given that this likely is vascular parkinsonism, we tapered him off of levodopa.  He had no problem getting off of it, and wife did not notice a big difference clinically and him off of it.             2.  Dementia with hallucinations             -no longer able to do moca             -on nuplazid and helped but still having confusion             -I am going to try to add Depakote ER, 250 mg daily for the agitation and confusion.  Risks, benefits, side effects and alternative therapies were discussed.  The opportunity to ask questions was given and they were answered to the best of my ability.  The patient expressed understanding and willingness to follow  the outlined treatment protocols.  -Discussed that I may need to add quetiapine, but I did not want to add that on to have a new placid without an EKG, which I cannot get right now given that he is homebound due to coronavirus pandemic.  -Patient wife asked me about Prevagen.  I told her I did not think that that would be beneficial.  -Patient's wife would like to see the patient off of Xanax.  I told her not to stop that cold [place].  He was currently on 0.5 mg at night and has been on that many years.  I told her to give a half a tablet at night for 1 month and then she can try to stop it.  He may have rebound insomnia because of it. 3.  Periodic limb movement disorder and very mild sleep apnea             -The patient saw [name] and had a sleep study on 06/04/2016.  AHI was only 8.1 and CPAP was not  recommended.  The patient should sleep in the side-lying position.  She did recommend low-dose clonazepam at night. 4.  LBP             -The patient has seen Dr. Carloyn Manner and Dr. Nelva Bush along with chiropractics.  While I did see he had an MRI of his lumbar spine in 2014 that demonstrated moderate to severe neural foraminal stenosis at L4-L5 and significant degenerative changes, they are not interested in further surgery or injections and have failed alternative (chiropractic) tx. He is taking tylenol for pain 5.  Depression             -on Lexapro, 10 mg daily             -Off Wellbutrin, which increased hallucinations. 6.  Weight loss             -Stable since last visit.  Wife states that he is eating chocolate ice cream.  Follow Up Instructions:    -I discussed the assessment and treatment plan with the patient. The patient was provided an opportunity to ask questions and all were answered. The patient agreed with the plan and demonstrated an understanding of the instructions.   The patient was advised to call back or seek an in-person evaluation if the symptoms worshen or if the condition fails to improve as anticipated.    Total Time spent in visit with the patient was:  22 min, of which more than 50% of the time was spent in counseling and/or coordinating care on safety in the home and respite care for wife.   Pt understands and agrees with the plan of care outlined.     Alonza Bogus, DO

## 2019-03-16 ENCOUNTER — Encounter: Payer: Self-pay | Admitting: *Deleted

## 2019-03-16 ENCOUNTER — Encounter: Payer: Self-pay | Admitting: Neurology

## 2019-03-16 ENCOUNTER — Telehealth (INDEPENDENT_AMBULATORY_CARE_PROVIDER_SITE_OTHER): Payer: PPO | Admitting: Neurology

## 2019-03-16 ENCOUNTER — Other Ambulatory Visit: Payer: Self-pay

## 2019-03-16 DIAGNOSIS — G4761 Periodic limb movement disorder: Secondary | ICD-10-CM | POA: Diagnosis not present

## 2019-03-16 DIAGNOSIS — M545 Low back pain: Secondary | ICD-10-CM | POA: Diagnosis not present

## 2019-03-16 DIAGNOSIS — F0390 Unspecified dementia without behavioral disturbance: Secondary | ICD-10-CM

## 2019-03-16 DIAGNOSIS — F0392 Unspecified dementia, unspecified severity, with psychotic disturbance: Secondary | ICD-10-CM

## 2019-03-16 DIAGNOSIS — F329 Major depressive disorder, single episode, unspecified: Secondary | ICD-10-CM | POA: Diagnosis not present

## 2019-03-16 DIAGNOSIS — R634 Abnormal weight loss: Secondary | ICD-10-CM

## 2019-03-16 DIAGNOSIS — G4730 Sleep apnea, unspecified: Secondary | ICD-10-CM

## 2019-03-16 DIAGNOSIS — G214 Vascular parkinsonism: Secondary | ICD-10-CM | POA: Diagnosis not present

## 2019-03-16 DIAGNOSIS — R443 Hallucinations, unspecified: Secondary | ICD-10-CM

## 2019-03-16 MED ORDER — DIVALPROEX SODIUM ER 250 MG PO TB24: 250.0000 mg | ORAL_TABLET | Freq: Every day | ORAL | 1 refills | Status: DC

## 2019-03-17 ENCOUNTER — Ambulatory Visit: Payer: PPO | Admitting: Neurology

## 2019-03-22 ENCOUNTER — Ambulatory Visit: Payer: PPO

## 2019-04-23 ENCOUNTER — Telehealth: Payer: Self-pay | Admitting: Neurology

## 2019-04-23 NOTE — Telephone Encounter (Signed)
Called spoke with [name] patient spouse Patient had PHONE VISIT appt on 03/17/19  New medication DIVALPROEX-SO -ER 250 MG patient has been on this medication for about 1 month now she has notice that he is not as alert. She has notice  memory loss. Pt is more confused. And unable to remember normal things around the house.   Jane request that patient STOP taken this medication . She is not sure why patient was put on this medication. She states that she though this was a medication for seizure which he does not have. She would like to know if he can come off to see if this changes anything.   ALPRAZOLAM 0.5 MG he is no longer taken this.  Please advise  [phone]

## 2019-04-23 NOTE — Telephone Encounter (Signed)
Let the patient know that while it may be indicated for seizure, the medication is also indicated as a mood stabilizer and if she recalls, she was complaining of agitation and that is why we started the medication.  If she wants to stop it she can.  However, agitation may get worse.  If it does, we will need to refer to psychiatry

## 2019-04-23 NOTE — Telephone Encounter (Signed)
Called patient spouse [name] she was informed and aware of Dr. Carles Collet response below. She has decided to take patient off medication.

## 2019-04-23 NOTE — Telephone Encounter (Signed)
Patient's wife called in needing to speak with you regarding his medications. Please Call. Thanks

## 2019-04-26 ENCOUNTER — Ambulatory Visit (INDEPENDENT_AMBULATORY_CARE_PROVIDER_SITE_OTHER): Payer: Self-pay | Admitting: Podiatry

## 2019-04-26 ENCOUNTER — Other Ambulatory Visit: Payer: Self-pay

## 2019-04-26 VITALS — Temp 97.5°F

## 2019-04-26 DIAGNOSIS — M79675 Pain in left toe(s): Secondary | ICD-10-CM | POA: Diagnosis not present

## 2019-04-26 DIAGNOSIS — M79674 Pain in right toe(s): Secondary | ICD-10-CM | POA: Diagnosis not present

## 2019-04-26 DIAGNOSIS — D689 Coagulation defect, unspecified: Secondary | ICD-10-CM

## 2019-04-26 DIAGNOSIS — B351 Tinea unguium: Secondary | ICD-10-CM

## 2019-04-28 NOTE — Progress Notes (Signed)
Subjective:   Patient ID: Christian Wiley, male   DOB: 84 y.o.   MRN: [id number]   HPI Patient presents with thick yellow brittle nailbeds 1-5 both feet that he cannot cut and become sore with the corners getting irritated   ROS      Objective:  Physical Exam  Neurovascular status intact with thick yellow brittle nailbeds 1-5 both feet     Assessment:  Mycotic nail infection with pain 1-5 both feet     Plan:  Debride painful nailbeds 1-5 both feet with no iatrogenic bleeding noted

## 2019-05-05 DIAGNOSIS — M4726 Other spondylosis with radiculopathy, lumbar region: Secondary | ICD-10-CM | POA: Diagnosis not present

## 2019-05-05 DIAGNOSIS — Z1339 Encounter for screening examination for other mental health and behavioral disorders: Secondary | ICD-10-CM | POA: Diagnosis not present

## 2019-05-05 DIAGNOSIS — N401 Enlarged prostate with lower urinary tract symptoms: Secondary | ICD-10-CM | POA: Diagnosis not present

## 2019-05-05 DIAGNOSIS — E039 Hypothyroidism, unspecified: Secondary | ICD-10-CM | POA: Diagnosis not present

## 2019-05-05 DIAGNOSIS — I10 Essential (primary) hypertension: Secondary | ICD-10-CM | POA: Diagnosis not present

## 2019-05-05 DIAGNOSIS — G4730 Sleep apnea, unspecified: Secondary | ICD-10-CM | POA: Diagnosis not present

## 2019-05-05 DIAGNOSIS — R7302 Impaired glucose tolerance (oral): Secondary | ICD-10-CM | POA: Diagnosis not present

## 2019-05-05 DIAGNOSIS — G20 Parkinson's disease: Secondary | ICD-10-CM | POA: Diagnosis not present

## 2019-05-05 DIAGNOSIS — G629 Polyneuropathy, unspecified: Secondary | ICD-10-CM | POA: Diagnosis not present

## 2019-05-05 DIAGNOSIS — K5909 Other constipation: Secondary | ICD-10-CM | POA: Diagnosis not present

## 2019-05-05 DIAGNOSIS — E785 Hyperlipidemia, unspecified: Secondary | ICD-10-CM | POA: Diagnosis not present

## 2019-05-05 DIAGNOSIS — M1990 Unspecified osteoarthritis, unspecified site: Secondary | ICD-10-CM | POA: Diagnosis not present

## 2019-05-14 DIAGNOSIS — N200 Calculus of kidney: Secondary | ICD-10-CM | POA: Diagnosis not present

## 2019-05-14 DIAGNOSIS — R3915 Urgency of urination: Secondary | ICD-10-CM | POA: Diagnosis not present

## 2019-05-14 DIAGNOSIS — N401 Enlarged prostate with lower urinary tract symptoms: Secondary | ICD-10-CM | POA: Diagnosis not present

## 2019-05-14 DIAGNOSIS — R351 Nocturia: Secondary | ICD-10-CM | POA: Diagnosis not present

## 2019-05-17 DIAGNOSIS — N401 Enlarged prostate with lower urinary tract symptoms: Secondary | ICD-10-CM | POA: Diagnosis not present

## 2019-05-17 DIAGNOSIS — G20 Parkinson's disease: Secondary | ICD-10-CM | POA: Diagnosis not present

## 2019-05-17 DIAGNOSIS — E039 Hypothyroidism, unspecified: Secondary | ICD-10-CM | POA: Diagnosis not present

## 2019-05-17 DIAGNOSIS — R7302 Impaired glucose tolerance (oral): Secondary | ICD-10-CM | POA: Diagnosis not present

## 2019-05-17 DIAGNOSIS — G629 Polyneuropathy, unspecified: Secondary | ICD-10-CM | POA: Diagnosis not present

## 2019-05-17 DIAGNOSIS — I10 Essential (primary) hypertension: Secondary | ICD-10-CM | POA: Diagnosis not present

## 2019-05-17 DIAGNOSIS — G4730 Sleep apnea, unspecified: Secondary | ICD-10-CM | POA: Diagnosis not present

## 2019-05-17 DIAGNOSIS — M4726 Other spondylosis with radiculopathy, lumbar region: Secondary | ICD-10-CM | POA: Diagnosis not present

## 2019-05-17 DIAGNOSIS — E785 Hyperlipidemia, unspecified: Secondary | ICD-10-CM | POA: Diagnosis not present

## 2019-05-17 DIAGNOSIS — M1990 Unspecified osteoarthritis, unspecified site: Secondary | ICD-10-CM | POA: Diagnosis not present

## 2019-05-17 DIAGNOSIS — K5909 Other constipation: Secondary | ICD-10-CM | POA: Diagnosis not present

## 2019-05-17 DIAGNOSIS — M1A9XX0 Chronic gout, unspecified, without tophus (tophi): Secondary | ICD-10-CM | POA: Diagnosis not present

## 2019-05-18 ENCOUNTER — Telehealth: Payer: Self-pay

## 2019-05-18 DIAGNOSIS — H903 Sensorineural hearing loss, bilateral: Secondary | ICD-10-CM | POA: Diagnosis not present

## 2019-05-18 NOTE — Telephone Encounter (Signed)
Wife called and wanted to make an appt for pt to be seen with Dr Scot Dock. She said that they were seeing Dr Bethel Born due to some issues that he was having. She said that she thinks somehow the ball got dropped and they were no longer being seen here for follow up visits like they should be.   Looked at last note and wife canceled appt saying it was not needed.   Called and left a message advising wife that we would get him set up for an appt for follow up.   York Cerise, CMA

## 2019-05-20 DIAGNOSIS — F329 Major depressive disorder, single episode, unspecified: Secondary | ICD-10-CM | POA: Diagnosis not present

## 2019-05-20 DIAGNOSIS — G4730 Sleep apnea, unspecified: Secondary | ICD-10-CM | POA: Diagnosis not present

## 2019-05-20 DIAGNOSIS — M4726 Other spondylosis with radiculopathy, lumbar region: Secondary | ICD-10-CM | POA: Diagnosis not present

## 2019-05-20 DIAGNOSIS — R351 Nocturia: Secondary | ICD-10-CM | POA: Diagnosis not present

## 2019-05-20 DIAGNOSIS — M1990 Unspecified osteoarthritis, unspecified site: Secondary | ICD-10-CM | POA: Diagnosis not present

## 2019-05-20 DIAGNOSIS — Z9049 Acquired absence of other specified parts of digestive tract: Secondary | ICD-10-CM | POA: Diagnosis not present

## 2019-05-20 DIAGNOSIS — Z9089 Acquired absence of other organs: Secondary | ICD-10-CM | POA: Diagnosis not present

## 2019-05-20 DIAGNOSIS — G20 Parkinson's disease: Secondary | ICD-10-CM | POA: Diagnosis not present

## 2019-05-20 DIAGNOSIS — E039 Hypothyroidism, unspecified: Secondary | ICD-10-CM | POA: Diagnosis not present

## 2019-05-20 DIAGNOSIS — N138 Other obstructive and reflux uropathy: Secondary | ICD-10-CM | POA: Diagnosis not present

## 2019-05-20 DIAGNOSIS — K5909 Other constipation: Secondary | ICD-10-CM | POA: Diagnosis not present

## 2019-05-20 DIAGNOSIS — N401 Enlarged prostate with lower urinary tract symptoms: Secondary | ICD-10-CM | POA: Diagnosis not present

## 2019-05-20 DIAGNOSIS — G629 Polyneuropathy, unspecified: Secondary | ICD-10-CM | POA: Diagnosis not present

## 2019-05-20 DIAGNOSIS — G959 Disease of spinal cord, unspecified: Secondary | ICD-10-CM | POA: Diagnosis not present

## 2019-05-20 DIAGNOSIS — Z955 Presence of coronary angioplasty implant and graft: Secondary | ICD-10-CM | POA: Diagnosis not present

## 2019-05-20 DIAGNOSIS — M1A9XX0 Chronic gout, unspecified, without tophus (tophi): Secondary | ICD-10-CM | POA: Diagnosis not present

## 2019-05-20 DIAGNOSIS — I951 Orthostatic hypotension: Secondary | ICD-10-CM | POA: Diagnosis not present

## 2019-05-20 DIAGNOSIS — I10 Essential (primary) hypertension: Secondary | ICD-10-CM | POA: Diagnosis not present

## 2019-05-20 DIAGNOSIS — K219 Gastro-esophageal reflux disease without esophagitis: Secondary | ICD-10-CM | POA: Diagnosis not present

## 2019-05-20 DIAGNOSIS — R7302 Impaired glucose tolerance (oral): Secondary | ICD-10-CM | POA: Diagnosis not present

## 2019-05-20 DIAGNOSIS — E785 Hyperlipidemia, unspecified: Secondary | ICD-10-CM | POA: Diagnosis not present

## 2019-05-20 DIAGNOSIS — E663 Overweight: Secondary | ICD-10-CM | POA: Diagnosis not present

## 2019-05-20 DIAGNOSIS — Z96653 Presence of artificial knee joint, bilateral: Secondary | ICD-10-CM | POA: Diagnosis not present

## 2019-05-20 DIAGNOSIS — F1320 Sedative, hypnotic or anxiolytic dependence, uncomplicated: Secondary | ICD-10-CM | POA: Diagnosis not present

## 2019-05-20 DIAGNOSIS — Z87891 Personal history of nicotine dependence: Secondary | ICD-10-CM | POA: Diagnosis not present

## 2019-05-23 ENCOUNTER — Emergency Department (HOSPITAL_COMMUNITY): Payer: PPO

## 2019-05-23 ENCOUNTER — Emergency Department (HOSPITAL_COMMUNITY)
Admission: EM | Admit: 2019-05-23 | Discharge: 2019-05-23 | Disposition: A | Discharge status: Home / Self Care | Payer: PPO | Attending: Emergency Medicine | Admitting: Emergency Medicine

## 2019-05-23 DIAGNOSIS — I10 Essential (primary) hypertension: Secondary | ICD-10-CM | POA: Diagnosis not present

## 2019-05-23 DIAGNOSIS — R300 Dysuria: Secondary | ICD-10-CM | POA: Insufficient documentation

## 2019-05-23 DIAGNOSIS — Z955 Presence of coronary angioplasty implant and graft: Secondary | ICD-10-CM | POA: Insufficient documentation

## 2019-05-23 DIAGNOSIS — Z85828 Personal history of other malignant neoplasm of skin: Secondary | ICD-10-CM | POA: Insufficient documentation

## 2019-05-23 DIAGNOSIS — R404 Transient alteration of awareness: Secondary | ICD-10-CM | POA: Diagnosis not present

## 2019-05-23 DIAGNOSIS — R140 Abdominal distension (gaseous): Secondary | ICD-10-CM | POA: Insufficient documentation

## 2019-05-23 DIAGNOSIS — R531 Weakness: Secondary | ICD-10-CM | POA: Diagnosis not present

## 2019-05-23 DIAGNOSIS — Z87891 Personal history of nicotine dependence: Secondary | ICD-10-CM | POA: Insufficient documentation

## 2019-05-23 DIAGNOSIS — I959 Hypotension, unspecified: Secondary | ICD-10-CM | POA: Diagnosis not present

## 2019-05-23 DIAGNOSIS — Z79899 Other long term (current) drug therapy: Secondary | ICD-10-CM | POA: Insufficient documentation

## 2019-05-23 DIAGNOSIS — E039 Hypothyroidism, unspecified: Secondary | ICD-10-CM | POA: Insufficient documentation

## 2019-05-23 DIAGNOSIS — N200 Calculus of kidney: Secondary | ICD-10-CM | POA: Diagnosis not present

## 2019-05-23 DIAGNOSIS — Z96651 Presence of right artificial knee joint: Secondary | ICD-10-CM | POA: Diagnosis not present

## 2019-05-23 DIAGNOSIS — R410 Disorientation, unspecified: Secondary | ICD-10-CM | POA: Diagnosis not present

## 2019-05-23 DIAGNOSIS — G20 Parkinson's disease: Secondary | ICD-10-CM | POA: Diagnosis not present

## 2019-05-23 DIAGNOSIS — F0150 Vascular dementia without behavioral disturbance: Secondary | ICD-10-CM | POA: Diagnosis not present

## 2019-05-23 DIAGNOSIS — K5730 Diverticulosis of large intestine without perforation or abscess without bleeding: Secondary | ICD-10-CM | POA: Diagnosis not present

## 2019-05-23 DIAGNOSIS — R4182 Altered mental status, unspecified: Secondary | ICD-10-CM | POA: Insufficient documentation

## 2019-05-23 LAB — URINALYSIS, ROUTINE W REFLEX MICROSCOPIC
Bilirubin Urine: NEGATIVE
Glucose, UA: NEGATIVE mg/dL
Hgb urine dipstick: NEGATIVE
Ketones, ur: NEGATIVE mg/dL
Leukocytes,Ua: NEGATIVE
Nitrite: NEGATIVE
Protein, ur: NEGATIVE mg/dL
Specific Gravity, Urine: 1.012 (ref 1.005–1.030)
pH: 7 (ref 5.0–8.0)

## 2019-05-23 LAB — CBC
HCT: 41.5 % (ref 39.0–52.0)
Hemoglobin: 13.8 g/dL (ref 13.0–17.0)
MCH: 31.4 pg (ref 26.0–34.0)
MCHC: 33.3 g/dL (ref 30.0–36.0)
MCV: 94.3 fL (ref 80.0–100.0)
Platelets: 132 10*3/uL — ABNORMAL LOW (ref 150–400)
RBC: 4.4 MIL/uL (ref 4.22–5.81)
RDW: 14.6 % (ref 11.5–15.5)
WBC: 9.7 10*3/uL (ref 4.0–10.5)
nRBC: 0 % (ref 0.0–0.2)

## 2019-05-23 LAB — CBG MONITORING, ED: Glucose-Capillary: 145 mg/dL — ABNORMAL HIGH (ref 70–99)

## 2019-05-23 LAB — COMPREHENSIVE METABOLIC PANEL
ALT: 30 U/L (ref 0–44)
AST: 26 U/L (ref 15–41)
Albumin: 3.9 g/dL (ref 3.5–5.0)
Alkaline Phosphatase: 192 U/L — ABNORMAL HIGH (ref 38–126)
Anion gap: 10 (ref 5–15)
BUN: 16 mg/dL (ref 8–23)
CO2: 26 mmol/L (ref 22–32)
Calcium: 9 mg/dL (ref 8.9–10.3)
Chloride: 101 mmol/L (ref 98–111)
Creatinine, Ser: 1.05 mg/dL (ref 0.61–1.24)
GFR calc Af Amer: >60 mL/min (ref >60)
GFR calc non Af Amer: >60 mL/min (ref >60)
Glucose, Bld: 163 mg/dL — ABNORMAL HIGH (ref 70–99)
Potassium: 3.9 mmol/L (ref 3.5–5.1)
Sodium: 137 mmol/L (ref 135–145)
Total Bilirubin: 1.5 mg/dL — ABNORMAL HIGH (ref 0.3–1.2)
Total Protein: 6.9 g/dL (ref 6.5–8.1)

## 2019-05-23 MED ORDER — SODIUM CHLORIDE (PF) 0.9 % IJ SOLN
INTRAMUSCULAR | Status: AC
  Filled 2019-05-23: qty 50

## 2019-05-23 MED ORDER — IOHEXOL 300 MG/ML  SOLN
100.0000 mL | Freq: Once | INTRAMUSCULAR | Status: AC | PRN
  Administered 2019-05-23: 100 mL via INTRAVENOUS

## 2019-05-23 NOTE — ED Triage Notes (Signed)
Pt brought in by EMS for c/o increased confusion and possible UTI. Wife states he has dementia and parkinsons and has been doing well but yesterday was more confused and weak. States a hx of UTI with sepsis so she wanted to get him checked before it got worse. Alert.

## 2019-05-23 NOTE — ED Notes (Signed)
Bed: WA04 Expected date:  Expected time:  Means of arrival:  Comments: EMS/confusion

## 2019-05-23 NOTE — ED Notes (Signed)
Attempted to get pt to urinate in urinal again. Will try to place condom catheter

## 2019-05-23 NOTE — ED Notes (Signed)
Patient transported to CT 

## 2019-05-23 NOTE — ED Provider Notes (Signed)
[facility] DEPT Provider Note   CSN: [id number] Arrival date & time: 05/23/19  1147     History   Chief Complaint Chief Complaint  Patient presents with  . Altered Mental Status    HPI Christian Wiley is a 84 y.o. male.     Patient brought in by EMS for increased confusion and possible urinary tract infection.  Patient's wife stated that he has dementia and Parkinson's has been doing well but yesterday was more confused and seemed to have generalized weakness.  He has a history of urinary tract infection with sepsis in the past and they wanted to make sure that was not ongoing.  Patient states that he has had some discomfort with urination.  Denies any fevers cough or upper respiratory symptoms.     Past Medical History:  Diagnosis Date  . Anticoagulant long-term use    xarelto-- due to hx PE's   . Chronic constipation   . Chronic low back pain   . DDD (degenerative disc disease), lumbar   . GERD (gastroesophageal reflux disease)   . H/O endovascular stent graft for abdominal aortic aneurysm followed by dr Scot Dock   04/ 2001  s/p infarenal AAA w/ AneuRx graft stent:  last duplex 08-14-2016 repair patent, AAA sac size 4.5cm x 4.72cm (stable), no endoleak detected  . Hiatal hernia   . History of adenomatous polyp of colon    2010-- tubular adenoma and hyperplastic  . History of basal cell carcinoma excision    yrs ago  . History of BPH   . History of gout    per pt's wife gout stable as of 12-19-2016  . History of pulmonary embolus (PE)    04/ 2011 and 10/ 2014  . History of skin cancer   . Hyperlipidemia   . Hypertension   . Hypothyroidism   . Left ureteral stone   . Mild sleep apnea    per study 06-04-2016 very mild sleep apnea not obstructive , cpap not recommended  . OA (osteoarthritis)   . RBBB (right bundle branch block)   . Vascular dementia ([place])   . Vascular parkinsonism [facility])    neurologist-  dr tat  . Wears hearing aid     bilateral   . Wears partial dentures    upper    Patient Active Problem List   Diagnosis Date Noted  . Troponin level elevated 01/09/2017  . Sepsis ([place]) 01/06/2017  . Sepsis secondary to UTI ([place]) 01/06/2017  . PBA (pseudobulbar affect) 06/24/2016  . Vascular dementia ([place]) 06/24/2016  . Greater trochanteric bursitis of right hip 07/15/2014  . Bursitis of hip 07/15/2014  . Pulmonary embolism ([place]) 10/01/2013    Class: Acute  . Thrombocytopenia ([place]) 10/01/2013    Class: Acute  . Hypertension 10/01/2013    Class: Chronic  . Abdominal aneurysm without mention of rupture 01/15/2012    Past Surgical History:  Procedure Laterality Date  . ABDOMINAL AORTIC ENDOVASCULAR STENT GRAFT  04/ 2001   dr Scot Dock   AneuRx graft stent  . ABDOMINAL AORTOGRAM N/A 06/12/2018   Procedure: ABDOMINAL AORTOGRAM;  Surgeon: Angelia Mould, MD;  Location: [facility];  Service: Cardiovascular;  Laterality: N/A;  . CATARACT EXTRACTION W/ INTRAOCULAR LENS  IMPLANT, BILATERAL  2001 approx.  . CHOLECYSTECTOMY OPEN  1980's  . COLONOSCOPY  last one 01/ 2010  . CYSTOSCOPY/RETROGRADE/URETEROSCOPY/STONE EXTRACTION WITH BASKET Left 12/24/2016   Procedure: CYSTOSCOPY/RETROGRADE/URETEROSCOPY/STONE EXTRACTION WITH BASKET HOLMIUM LASER STENT PLACEMENT;  Surgeon:  Festus Aloe, MD;  Location: [facility];  Service: Urology;  Laterality: Left;  . EXCISION/RELEASE BURSA HIP Right 07/15/2014   Procedure: RIGHT HIP BURSECTOMY WITH TENDON REPAIR ;  Surgeon: Gearlean Alf, MD;  Location: [facility];  Service: Orthopedics;  Laterality: Right;  . HOLMIUM LASER APPLICATION Left 05/15/3015   Procedure: HOLMIUM LASER APPLICATION;  Surgeon: Festus Aloe, MD;  Location: [facility];  Service: Urology;  Laterality: Left;  . IR ANGIOGRAM PELVIS SELECTIVE OR SUPRASELECTIVE  07/03/2018  . IR ANGIOGRAM PELVIS SELECTIVE OR SUPRASELECTIVE  07/03/2018  . IR RADIOLOGIST EVAL & MGMT  06/24/2018   . IR RADIOLOGIST EVAL & MGMT  09/01/2018  . IR US GUIDE VASC ACCESS LEFT  07/03/2018  . IR US GUIDE VASC ACCESS RIGHT  07/03/2018  . KNEE ARTHROSCOPY Bilateral 1990's-- right x2 ;  left x1  . LOWER EXTREMITY ANGIOGRAM  06/12/2018   Procedure: Lower Extremity Angiogram;  Surgeon: Angelia Mould, MD;  Location: [facility];  Service: Cardiovascular;;  . POSTERIOR LAMINECTOMY / DECOMPRESSION LUMBAR SPINE  03/24/2002   L2 -- S1  . POSTERIOR LUMBAR FUSION  11/16/2010   L2 -- L4  . SHOULDER OPEN ROTATOR CUFF REPAIR Bilateral lef 11/15/2009/  right 2013 approx  . TONSILLECTOMY  child  . TOTAL KNEE ARTHROPLASTY  right 08-08-2000/  left 01-28-2003  . TRANSURETHRAL RESECTION OF PROSTATE Left 12/24/2016   Procedure: TRANSURETHRAL RESECTION OF THE PROSTATE (TURP);  Surgeon: Festus Aloe, MD;  Location[facility];  Service: Urology;  Laterality: Left;        Home Medications    Prior to Admission medications   Medication Sig Start Date End Date Taking? Authorizing Provider  allopurinol (ZYLOPRIM) 300 MG tablet Take 300 mg by mouth every morning.    Yes [provider]  ALPRAZolam Duanne Moron) 0.5 MG tablet Take 0.25 mg by mouth at bedtime as needed for anxiety or sleep.  01/29/17  Yes [provider]  atorvastatin (LIPITOR) 40 MG tablet Take 40 mg by mouth at bedtime.  06/25/13  Yes [provider]  escitalopram (LEXAPRO) 10 MG tablet Take 10 mg by mouth every morning.  05/20/16  Yes [provider]  finasteride (PROSCAR) 5 MG tablet Take 5 mg daily by mouth. 07/26/17  Yes [provider]  levothyroxine (SYNTHROID, LEVOTHROID) 25 MCG tablet Take 25 mcg by mouth daily before breakfast.  12/05/15  Yes [provider]  mirabegron ER (MYRBETRIQ) 25 MG TB24 tablet Take 25 mg by mouth daily.   Yes [provider]  mirtazapine (REMERON) 15 MG tablet Take 15 mg by mouth at bedtime. 05/05/19  Yes [provider]   nebivolol (BYSTOLIC) 5 MG tablet Take 5 mg by mouth every morning.    Yes [provider]  omeprazole (PRILOSEC) 40 MG capsule Take 40 mg by mouth every other day.  10/03/17  Yes [provider]  Pimavanserin Tartrate (NUPLAZID) 34 MG CAPS Take 1 capsule by mouth daily. 12/21/18  Yes Tat, Eustace Quail, DO  polyethylene glycol (MIRALAX / GLYCOLAX) packet Take 17 g by mouth daily as needed for moderate constipation.    Yes [provider]    Family History Family History  Problem Relation Age of Onset  . Hyperlipidemia Mother   . Hypertension Mother   . Hyperlipidemia Father   . Hypertension Father   . Heart disease Father   . Heart attack Father        died age  89  . Cancer Sister   . Hyperlipidemia Brother   . Hypertension Daughter     Social History Social History   Tobacco Use  . Smoking status: Former Smoker    Years: 20.00    Types: Cigarettes    Quit date: 10/01/1974    Years since quitting: 44.6  . Smokeless tobacco: Never Used  Substance Use Topics  . Alcohol use: No    Alcohol/week: 0.0 standard drinks  . Drug use: No     Allergies   Ambien [zolpidem tartrate]   Review of Systems Review of Systems  Unable to perform ROS: Dementia     Physical Exam Updated Vital Signs BP (!) 155/86   Pulse 64   Temp 98.1 F (36.7 C)   Resp (!) 22   SpO2 99%   Physical Exam Vitals signs and nursing note reviewed.  Constitutional:      Appearance: Normal appearance. He is well-developed.  HENT:     Head: Normocephalic and atraumatic.  Eyes:     Extraocular Movements: Extraocular movements intact.     Conjunctiva/sclera: Conjunctivae normal.     Pupils: Pupils are equal, round, and reactive to light.  Neck:     Musculoskeletal: Neck supple.  Cardiovascular:     Rate and Rhythm: Normal rate and regular rhythm.     Heart sounds: No murmur.  Pulmonary:     Effort: Pulmonary effort is normal. No respiratory distress.     Breath sounds:  Normal breath sounds.  Chest:     Chest wall: No tenderness.  Abdominal:     Palpations: Abdomen is soft.     Tenderness: There is no abdominal tenderness.     Comments: Abdomen nontender  Musculoskeletal: Normal range of motion.  Skin:    General: Skin is warm and dry.  Neurological:     General: No focal deficit present.     Mental Status: He is alert. Mental status is at baseline.     Comments: Alert and will answer questions with some degree of confusion.      ED Treatments / Results  Labs (all labs ordered are listed, but only abnormal results are displayed) Labs Reviewed  COMPREHENSIVE METABOLIC PANEL - Abnormal; Notable for the following components:      Result Value   Glucose, Bld 163 (*)    Alkaline Phosphatase 192 (*)    Total Bilirubin 1.5 (*)    All other components within normal limits  CBC - Abnormal; Notable for the following components:   Platelets 132 (*)    All other components within normal limits  CBG MONITORING, ED - Abnormal; Notable for the following components:   Glucose-Capillary 145 (*)    All other components within normal limits  URINALYSIS, ROUTINE W REFLEX MICROSCOPIC   Results for orders placed or performed during the hospital encounter of 05/23/19  Comprehensive metabolic panel  Result Value Ref Range   Sodium 137 135 - 145 mmol/L   Potassium 3.9 3.5 - 5.1 mmol/L   Chloride 101 98 - 111 mmol/L   CO2 26 22 - 32 mmol/L   Glucose, Bld 163 (H) 70 - 99 mg/dL   BUN 16 8 - 23 mg/dL   Creatinine, Ser 1.05 0.61 - 1.24 mg/dL   Calcium 9.0 8.9 - 10.3 mg/dL   Total Protein 6.9 6.5 - 8.1 g/dL   Albumin 3.9 3.5 - 5.0 g/dL   AST 26 15 - 41 U/L   ALT 30 0 - 44 U/L  Alkaline Phosphatase 192 (H) 38 - 126 U/L   Total Bilirubin 1.5 (H) 0.3 - 1.2 mg/dL   GFR calc non Af Amer >60 >60 mL/min   GFR calc Af Amer >60 >60 mL/min   Anion gap 10 5 - 15  CBC  Result Value Ref Range   WBC 9.7 4.0 - 10.5 K/uL   RBC 4.40 4.22 - 5.81 MIL/uL   Hemoglobin 13.8  13.0 - 17.0 g/dL   HCT 41.5 39.0 - 52.0 %   MCV 94.3 80.0 - 100.0 fL   MCH 31.4 26.0 - 34.0 pg   MCHC 33.3 30.0 - 36.0 g/dL   RDW 14.6 11.5 - 15.5 %   Platelets 132 (L) 150 - 400 K/uL   nRBC 0.0 0.0 - 0.2 %  CBG monitoring, ED  Result Value Ref Range   Glucose-Capillary 145 (H) 70 - 99 mg/dL     EKG    Radiology Dg Chest Port 1 View  Result Date: 05/23/2019 CLINICAL DATA:  Pt brought in by EMS for c/o increased confusion and possible UTI. Wife states he has dementia and parkinsons and has been doing well but yesterday was more confused and weak. States a hx of UTI with sepsis so she wanted to get h.*comment was truncated*AMS EXAM: PORTABLE CHEST 1 VIEW COMPARISON:  None. FINDINGS: Normal mediastinum and cardiac silhouette. Normal pulmonary vasculature. No evidence of effusion, infiltrate, or pneumothorax. No acute bony abnormality. Mild intimal calcification aorta. IMPRESSION: Normal chest radiograph. Electronically Signed   By: Suzy Bouchard M.D.   On: 05/23/2019 14:59    Procedures Procedures (including critical care time)  Medications Ordered in ED Medications - No data to display   Initial Impression / Assessment and Plan / ED Course  I have reviewed the triage vital signs and the nursing notes.  Pertinent labs & imaging results that were available during my care of the patient were reviewed by me and considered in my medical decision making (see chart for details).        Patient is alert.  But does have a history of dementia.  Does not seem to have significant depressed mental status.  According to patient's wife he is showing more confusion than usual.  No obvious focal deficit.  Patient did describe some dysuria.  Patient basic labs without significant abnormalities chest x-ray negative for pneumonia.  Head CT is pending and urinalysis is still pending.  Family's main concern was to make sure he did not have a urinary tract infection.  Patient with some liver  function test abnormalities.  Patient's abdomen is nontender.  Based on this we will get CT abdomen.  Final Clinical Impressions(s) / ED Diagnoses   Final diagnoses:  Altered mental status, unspecified altered mental status type  Dysuria    ED Discharge Orders    None       [name], MD 05/23/19 1528

## 2019-05-24 DIAGNOSIS — R410 Disorientation, unspecified: Secondary | ICD-10-CM | POA: Diagnosis not present

## 2019-05-24 DIAGNOSIS — F329 Major depressive disorder, single episode, unspecified: Secondary | ICD-10-CM | POA: Diagnosis not present

## 2019-05-24 DIAGNOSIS — I10 Essential (primary) hypertension: Secondary | ICD-10-CM | POA: Diagnosis not present

## 2019-05-24 DIAGNOSIS — G20 Parkinson's disease: Secondary | ICD-10-CM | POA: Diagnosis not present

## 2019-05-25 ENCOUNTER — Emergency Department (HOSPITAL_COMMUNITY)
Admission: EM | Admit: 2019-05-25 | Discharge: 2019-05-25 | Disposition: A | Discharge status: Home / Self Care | Payer: PPO | Attending: Emergency Medicine | Admitting: Emergency Medicine

## 2019-05-25 ENCOUNTER — Encounter (HOSPITAL_COMMUNITY): Payer: Self-pay

## 2019-05-25 ENCOUNTER — Emergency Department (HOSPITAL_COMMUNITY): Payer: PPO

## 2019-05-25 ENCOUNTER — Other Ambulatory Visit: Payer: Self-pay

## 2019-05-25 DIAGNOSIS — Z87891 Personal history of nicotine dependence: Secondary | ICD-10-CM | POA: Diagnosis not present

## 2019-05-25 DIAGNOSIS — I10 Essential (primary) hypertension: Secondary | ICD-10-CM | POA: Diagnosis not present

## 2019-05-25 DIAGNOSIS — Z955 Presence of coronary angioplasty implant and graft: Secondary | ICD-10-CM | POA: Insufficient documentation

## 2019-05-25 DIAGNOSIS — E039 Hypothyroidism, unspecified: Secondary | ICD-10-CM | POA: Insufficient documentation

## 2019-05-25 DIAGNOSIS — Z209 Contact with and (suspected) exposure to unspecified communicable disease: Secondary | ICD-10-CM | POA: Diagnosis not present

## 2019-05-25 DIAGNOSIS — R531 Weakness: Secondary | ICD-10-CM | POA: Diagnosis not present

## 2019-05-25 DIAGNOSIS — Z20828 Contact with and (suspected) exposure to other viral communicable diseases: Secondary | ICD-10-CM | POA: Insufficient documentation

## 2019-05-25 DIAGNOSIS — R410 Disorientation, unspecified: Secondary | ICD-10-CM | POA: Diagnosis not present

## 2019-05-25 DIAGNOSIS — R509 Fever, unspecified: Secondary | ICD-10-CM | POA: Diagnosis not present

## 2019-05-25 DIAGNOSIS — Z85828 Personal history of other malignant neoplasm of skin: Secondary | ICD-10-CM | POA: Diagnosis not present

## 2019-05-25 DIAGNOSIS — Z96651 Presence of right artificial knee joint: Secondary | ICD-10-CM | POA: Diagnosis not present

## 2019-05-25 DIAGNOSIS — S3993XA Unspecified injury of pelvis, initial encounter: Secondary | ICD-10-CM | POA: Diagnosis not present

## 2019-05-25 DIAGNOSIS — Z79899 Other long term (current) drug therapy: Secondary | ICD-10-CM | POA: Insufficient documentation

## 2019-05-25 DIAGNOSIS — R0902 Hypoxemia: Secondary | ICD-10-CM | POA: Diagnosis not present

## 2019-05-25 DIAGNOSIS — I4510 Unspecified right bundle-branch block: Secondary | ICD-10-CM | POA: Diagnosis not present

## 2019-05-25 LAB — CBC WITH DIFFERENTIAL/PLATELET
Abs Immature Granulocytes: 0.03 10*3/uL (ref 0.00–0.07)
Basophils Absolute: 0 10*3/uL (ref 0.0–0.1)
Basophils Relative: 0 %
Eosinophils Absolute: 0 10*3/uL (ref 0.0–0.5)
Eosinophils Relative: 0 %
HCT: 39.6 % (ref 39.0–52.0)
Hemoglobin: 13.2 g/dL (ref 13.0–17.0)
Immature Granulocytes: 0 %
Lymphocytes Relative: 17 %
Lymphs Abs: 1.2 10*3/uL (ref 0.7–4.0)
MCH: 31.2 pg (ref 26.0–34.0)
MCHC: 33.3 g/dL (ref 30.0–36.0)
MCV: 93.6 fL (ref 80.0–100.0)
Monocytes Absolute: 0.3 10*3/uL (ref 0.1–1.0)
Monocytes Relative: 5 %
Neutro Abs: 5.8 10*3/uL (ref 1.7–7.7)
Neutrophils Relative %: 78 %
Platelets: 141 10*3/uL — ABNORMAL LOW (ref 150–400)
RBC: 4.23 MIL/uL (ref 4.22–5.81)
RDW: 14.8 % (ref 11.5–15.5)
WBC: 7.4 10*3/uL (ref 4.0–10.5)
nRBC: 0 % (ref 0.0–0.2)

## 2019-05-25 LAB — BASIC METABOLIC PANEL
Anion gap: 14 (ref 5–15)
BUN: 28 mg/dL — ABNORMAL HIGH (ref 8–23)
CO2: 23 mmol/L (ref 22–32)
Calcium: 8.7 mg/dL — ABNORMAL LOW (ref 8.9–10.3)
Chloride: 101 mmol/L (ref 98–111)
Creatinine, Ser: 1.14 mg/dL (ref 0.61–1.24)
GFR calc Af Amer: >60 mL/min (ref >60)
GFR calc non Af Amer: 60 mL/min — ABNORMAL LOW (ref >60)
Glucose, Bld: 161 mg/dL — ABNORMAL HIGH (ref 70–99)
Potassium: 3.5 mmol/L (ref 3.5–5.1)
Sodium: 138 mmol/L (ref 135–145)

## 2019-05-25 LAB — URINALYSIS, ROUTINE W REFLEX MICROSCOPIC
Bilirubin Urine: NEGATIVE
Glucose, UA: NEGATIVE mg/dL
Hgb urine dipstick: NEGATIVE
Ketones, ur: NEGATIVE mg/dL
Leukocytes,Ua: NEGATIVE
Nitrite: NEGATIVE
Protein, ur: NEGATIVE mg/dL
Specific Gravity, Urine: 1.015 (ref 1.005–1.030)
pH: 7 (ref 5.0–8.0)

## 2019-05-25 LAB — SARS CORONAVIRUS 2 BY RT PCR (HOSPITAL ORDER, PERFORMED IN ~~LOC~~ HOSPITAL LAB): SARS Coronavirus 2: NEGATIVE

## 2019-05-25 LAB — LACTIC ACID, PLASMA: Lactic Acid, Venous: 1.6 mmol/L (ref 0.5–1.9)

## 2019-05-25 MED ORDER — SODIUM CHLORIDE 0.9 % IV BOLUS
500.0000 mL | Freq: Once | INTRAVENOUS | Status: AC
  Administered 2019-05-25: 500 mL via INTRAVENOUS

## 2019-05-25 MED ORDER — ACETAMINOPHEN 650 MG RE SUPP
650.0000 mg | Freq: Once | RECTAL | Status: AC
  Administered 2019-05-25: 650 mg via RECTAL
  Filled 2019-05-25: qty 1

## 2019-05-25 NOTE — ED Notes (Signed)
Bed: WA09 Expected date:  Expected time:  Means of arrival:  Comments: EMS 84yo increasing weakness, seen yesterday for same

## 2019-05-25 NOTE — ED Triage Notes (Signed)
Pt BIBA from home. Pt lives there with wife. Pt c/o increased generalized weakness. Pt denies any complaints. Pt has not voided since last night.  100.3 temp with EMS.

## 2019-05-25 NOTE — Discharge Instructions (Signed)
Please follow-up with your primary care doctor tomorrow.  Return to the ED if symptoms worsen.

## 2019-05-25 NOTE — ED Notes (Signed)
Pt d/c home per MD order. Discharge summary reviewed with pt. Pt off unit via wheelchair, Pt wife out front. nursing staff 3 x assist to get pt in vehicle. Pt wife states they have help at home.

## 2019-05-25 NOTE — ED Provider Notes (Signed)
[place] DEPT Provider Note   CSN: [id number] Arrival date & time: 05/25/19  1055    History   Chief Complaint Chief Complaint  Patient presents with  . Weakness    HPI Christian Wiley is a 84 y.o. male.     The history is provided by the EMS personnel.  Weakness Severity:  Mild Onset quality:  Gradual Timing:  Constant Progression:  Worsening Chronicity:  New Context: dehydration   Relieved by:  Nothing Ineffective treatments:  Drinking fluids Associated symptoms: no abdominal pain, no arthralgias, no chest pain, no cough, no dysuria, no fever, no seizures, no shortness of breath and no vomiting   Risk factors: neurologic disease (parkinson)     Past Medical History:  Diagnosis Date  . Anticoagulant long-term use    xarelto-- due to hx PE's   . Chronic constipation   . Chronic low back pain   . DDD (degenerative disc disease), lumbar   . GERD (gastroesophageal reflux disease)   . H/O endovascular stent graft for abdominal aortic aneurysm followed by dr Scot Dock   04/ 2001  s/p infarenal AAA w/ AneuRx graft stent:  last duplex 08-14-2016 repair patent, AAA sac size 4.5cm x 4.72cm (stable), no endoleak detected  . Hiatal hernia   . History of adenomatous polyp of colon    2010-- tubular adenoma and hyperplastic  . History of basal cell carcinoma excision    yrs ago  . History of BPH   . History of gout    per pt's wife gout stable as of 12-19-2016  . History of pulmonary embolus (PE)    04/ 2011 and 10/ 2014  . History of skin cancer   . Hyperlipidemia   . Hypertension   . Hypothyroidism   . Left ureteral stone   . Mild sleep apnea    per study 06-04-2016 very mild sleep apnea not obstructive , cpap not recommended  . OA (osteoarthritis)   . RBBB (right bundle branch block)   . Vascular dementia ([place])   . 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 Wears hearing aid    bilateral   . Wears partial dentures     upper    Patient Active Problem List   Diagnosis Date Noted  . Troponin level elevated 01/09/2017  . Sepsis ([place]) 01/06/2017  . Sepsis secondary to UTI ([place]) 01/06/2017  . PBA (pseudobulbar affect) 06/24/2016  . Vascular dementia ([place]) 06/24/2016  . Greater trochanteric bursitis of right hip 07/15/2014  . Bursitis of hip 07/15/2014  . Pulmonary embolism ([place]) 10/01/2013    Class: Acute  . Thrombocytopenia ([place]) 10/01/2013    Class: Acute  . Hypertension 10/01/2013    Class: Chronic  . Abdominal aneurysm without mention of rupture 01/15/2012    Past Surgical History:  Procedure Laterality Date  . ABDOMINAL AORTIC ENDOVASCULAR STENT GRAFT  04/ 2001   dr Scot Dock   AneuRx graft stent  . ABDOMINAL AORTOGRAM N/A 06/12/2018   Procedure: ABDOMINAL AORTOGRAM;  Surgeon: Angelia Mould, MD;  Location: [place] [facility];  Service: Cardiovascular;  Laterality: N/A;  . CATARACT EXTRACTION W/ INTRAOCULAR LENS  IMPLANT, BILATERAL  2001 approx.  . CHOLECYSTECTOMY OPEN  1980's  . COLONOSCOPY  last one 01/ 2010  . CYSTOSCOPY/RETROGRADE/URETEROSCOPY/STONE EXTRACTION WITH BASKET Left 12/24/2016   Procedure: CYSTOSCOPY/RETROGRADE/URETEROSCOPY/STONE EXTRACTION WITH BASKET HOLMIUM LASER STENT PLACEMENT;  Surgeon: Festus Aloe, MD;  Location: [facility];  Service: Urology;  Laterality:  Left;  . EXCISION/RELEASE BURSA HIP Right 07/15/2014   Procedure: RIGHT HIP BURSECTOMY WITH TENDON REPAIR ;  Surgeon: Gearlean Alf, MD;  Location: [facility];  Service: Orthopedics;  Laterality: Right;  . HOLMIUM LASER APPLICATION Left 2/84/1324   Procedure: HOLMIUM LASER APPLICATION;  Surgeon: Festus Aloe, MD;  Location: [facility];  Service: Urology;  Laterality: Left;  . IR ANGIOGRAM PELVIS SELECTIVE OR SUPRASELECTIVE  07/03/2018  . IR ANGIOGRAM PELVIS SELECTIVE OR SUPRASELECTIVE  07/03/2018  . IR RADIOLOGIST EVAL & MGMT  06/24/2018  . IR RADIOLOGIST EVAL & MGMT  09/01/2018   . IR US GUIDE VASC ACCESS LEFT  07/03/2018  . IR US GUIDE VASC ACCESS RIGHT  07/03/2018  . KNEE ARTHROSCOPY Bilateral 1990's-- right x2 ;  left x1  . LOWER EXTREMITY ANGIOGRAM  06/12/2018   Procedure: Lower Extremity Angiogram;  Surgeon: Angelia Mould, MD;  Location: [place] [facility];  Service: Cardiovascular;;  . POSTERIOR LAMINECTOMY / DECOMPRESSION LUMBAR SPINE  03/24/2002   L2 -- S1  . POSTERIOR LUMBAR FUSION  11/16/2010   L2 -- L4  . SHOULDER OPEN ROTATOR CUFF REPAIR Bilateral lef 11/15/2009/  right 2013 approx  . TONSILLECTOMY  child  . TOTAL KNEE ARTHROPLASTY  right 08-08-2000/  left 01-28-2003  . TRANSURETHRAL RESECTION OF PROSTATE Left 12/24/2016   Procedure: TRANSURETHRAL RESECTION OF THE PROSTATE (TURP);  Surgeon: Festus Aloe, MD;  Location[facility];  Service: Urology;  Laterality: Left;        Home Medications    Prior to Admission medications   Medication Sig Start Date End Date Taking? Authorizing Provider  allopurinol (ZYLOPRIM) 300 MG tablet Take 300 mg by mouth every morning.     [provider]  ALPRAZolam Duanne Moron) 0.5 MG tablet Take 0.25 mg by mouth at bedtime as needed for anxiety or sleep.  01/29/17   [provider]  atorvastatin (LIPITOR) 40 MG tablet Take 40 mg by mouth at bedtime.  06/25/13   [provider]  escitalopram (LEXAPRO) 10 MG tablet Take 10 mg by mouth every morning.  05/20/16   [provider]  finasteride (PROSCAR) 5 MG tablet Take 5 mg daily by mouth. 07/26/17   [provider]  levothyroxine (SYNTHROID, LEVOTHROID) 25 MCG tablet Take 25 mcg by mouth daily before breakfast.  12/05/15   [provider]  mirabegron ER (MYRBETRIQ) 25 MG TB24 tablet Take 25 mg by mouth daily.    [provider]  mirtazapine (REMERON) 15 MG tablet Take 15 mg by mouth at bedtime. 05/05/19   [provider]  nebivolol (BYSTOLIC) 5 MG tablet Take 5 mg by mouth every morning.      [provider]  omeprazole (PRILOSEC) 40 MG capsule Take 40 mg by mouth every other day.  10/03/17   [provider]  Pimavanserin Tartrate (NUPLAZID) 34 MG CAPS Take 1 capsule by mouth daily. 12/21/18   Tat, Eustace Quail, DO  polyethylene glycol (MIRALAX / GLYCOLAX) packet Take 17 g by mouth daily as needed for moderate constipation.     [provider]    Family History Family History  Problem Relation Age of Onset  . Hyperlipidemia Mother   . Hypertension Mother   . Hyperlipidemia Father   . Hypertension Father   . Heart disease Father   . Heart attack Father        died age 32  . Cancer Sister   . Hyperlipidemia Brother   . Hypertension  Daughter     Social History Social History   Tobacco Use  . Smoking status: Former Smoker    Years: 20.00    Types: Cigarettes    Quit date: 10/01/1974    Years since quitting: 44.6  . Smokeless tobacco: Never Used  Substance Use Topics  . Alcohol use: No    Alcohol/week: 0.0 standard drinks  . Drug use: No     Allergies   Ambien [zolpidem tartrate]   Review of Systems Review of Systems  Constitutional: Negative for chills and fever.  HENT: Negative for ear pain and sore throat.   Eyes: Negative for pain and visual disturbance.  Respiratory: Negative for cough and shortness of breath.   Cardiovascular: Negative for chest pain and palpitations.  Gastrointestinal: Negative for abdominal pain and vomiting.  Genitourinary: Negative for dysuria and hematuria.  Musculoskeletal: Negative for arthralgias and back pain.  Skin: Negative for color change and rash.  Neurological: Positive for weakness. Negative for seizures and syncope.  All other systems reviewed and are negative.    Physical Exam Updated Vital Signs  ED Triage Vitals  Enc Vitals Group     BP 05/25/19 1122 135/62     Pulse Rate 05/25/19 1122 84     Resp 05/25/19 1122 16     Temp 05/25/19 1122 100.1 F (37.8 C)     Temp Source  05/25/19 1122 Oral     SpO2 05/25/19 1115 96 %     Weight 05/25/19 1125 194 lb 0.1 oz (88 kg)     Height --      Head Circumference --      Peak Flow --      Pain Score 05/25/19 1125 0     Pain Loc --      Pain Edu? --      Excl. in [place]? --     Physical Exam Vitals signs and nursing note reviewed.  Constitutional:      General: He is not in acute distress.    Appearance: He is well-developed. He is not ill-appearing.  HENT:     Head: Normocephalic and atraumatic.     Nose: Nose normal.  Eyes:     Extraocular Movements: Extraocular movements intact.     Conjunctiva/sclera: Conjunctivae normal.     Pupils: Pupils are equal, round, and reactive to light.  Neck:     Musculoskeletal: Neck supple.  Cardiovascular:     Rate and Rhythm: Normal rate and regular rhythm.     Pulses: Normal pulses.     Heart sounds: Normal heart sounds. No murmur.  Pulmonary:     Effort: Pulmonary effort is normal. No respiratory distress.     Breath sounds: Normal breath sounds.  Abdominal:     Palpations: Abdomen is soft.     Tenderness: There is no abdominal tenderness.  Musculoskeletal: Normal range of motion.        General: No tenderness.  Skin:    General: Skin is warm and dry.     Capillary Refill: Capillary refill takes less than 2 seconds.  Neurological:     General: No focal deficit present.     Mental Status: He is alert.     Cranial Nerves: No cranial nerve deficit.     Sensory: No sensory deficit.      ED Treatments / Results  Labs (all labs ordered are listed, but only abnormal results are displayed) Labs Reviewed  CBC WITH DIFFERENTIAL/PLATELET - Abnormal; Notable for the following  components:      Result Value   Platelets 141 (*)    All other components within normal limits  BASIC METABOLIC PANEL - Abnormal; Notable for the following components:   Glucose, Bld 161 (*)    BUN 28 (*)    Calcium 8.7 (*)    GFR calc non Af Amer 60 (*)    All other components within  normal limits  SARS CORONAVIRUS 2 (HOSPITAL ORDER, [place] [facility])  CULTURE, BLOOD (ROUTINE X 2)  CULTURE, BLOOD (ROUTINE X 2)  URINALYSIS, ROUTINE W REFLEX MICROSCOPIC  LACTIC ACID, PLASMA    EKG EKG Interpretation  Date/Time:  Tuesday May 25 2019 11:27:41 EDT Ventricular Rate:  87 PR Interval:    QRS Duration: 135 QT Interval:  399 QTC Calculation: 480 R Axis:   56 Text Interpretation:  Sinus rhythm Atrial premature complexes Right bundle branch block Confirmed by Lennice Sites [phone]) on 05/25/2019 11:40:37 AM   Radiology Ct Head Wo Contrast  Result Date: 05/25/2019 CLINICAL DATA:  Increased weakness EXAM: CT HEAD WITHOUT CONTRAST TECHNIQUE: Contiguous axial images were obtained from the base of the skull through the vertex without intravenous contrast. COMPARISON:  05/23/2019 FINDINGS: Brain: No evidence of acute infarction, hemorrhage, hydrocephalus, or mass lesion/mass effect. Periventricular and deep white matter hypodensity. There is a probable small arachnoid cyst about the anterior right falx unchanged from multiple prior examinations. Vascular: No hyperdense vessel or unexpected calcification. Skull: Normal. Negative for fracture or focal lesion. Sinuses/Orbits: No acute finding. Other: None. IMPRESSION: No acute intracranial pathology. Small-vessel white matter disease and global volume loss in keeping with patient age. Electronically Signed   By: Eddie Candle M.D.   On: 05/25/2019 14:41   Ct Abdomen Pelvis W Contrast  Result Date: 05/23/2019 CLINICAL DATA:  Abdominal distension. EXAM: CT ABDOMEN AND PELVIS WITH CONTRAST TECHNIQUE: Multidetector CT imaging of the abdomen and pelvis was performed using the standard protocol following bolus administration of intravenous contrast. CONTRAST:  122mL OMNIPAQUE IOHEXOL 300 MG/ML  SOLN COMPARISON:  CT abdomen 09/01/2018 FINDINGS: Lower chest: Lung bases are clear. Hepatobiliary: Postcholecystectomy. Mild  intrahepatic extrahepatic duct dilatation a similar comparison exam in favored benign in post cholecystectomy dilatation. Small subsequent liver unchanged. Pancreas: Pancreas is normal. No ductal dilatation. No pancreatic inflammation. Spleen: Normal spleen Adrenals/urinary tract: Adrenal glands normal. Bilateral nephrolithiasis. Large calculus in the RIGHT renal pelvis measuring 7 mm. No evidence obstruction. Bilateral extrarenal pelves. Ureters and bladder normal. Stomach/Bowel: Stomach, small bowel, appendix, and cecum are normal. Multiple diverticula of the descending colon sigmoid colon. Moderate volume stool in the rectosigmoid colon. No acute inflammation. No high-grade obstruction. Vascular/Lymphatic: Abdominal aortic stent graft without complication. Reproductive: Prostate normal Other: No free fluid. Musculoskeletal: No aggressive osseous lesion. Severe degenerative changes in the lumbar spine. Interbody fusion at L2-L4. IMPRESSION: 1. No acute findings in the abdomen pelvis. 2. Extensive LEFT colon diverticulosis without evidence diverticulitis. 3. Moderate volume stool in the rectosigmoid colon may suggest constipation. 4. Abdominal aortic stent graft without complication. 5. Calculus in the RIGHT renal pelvis without obstruction. Bilateral nephrolithiasis. Electronically Signed   By: Suzy Bouchard M.D.   On: 05/23/2019 16:46   Dg Pelvis Portable  Result Date: 05/25/2019 CLINICAL DATA:  Increased generalized weakness.  Fall. EXAM: PORTABLE PELVIS 1-2 VIEWS COMPARISON:  Scout image for CT scan dated 05/23/2019 FINDINGS: There is no evidence of pelvic fracture or diastasis. Moderate bilateral arthritis of the hips. Degenerative disc disease in the lower lumbar spine. Aortobiiliac  stent graft. Visualized bowel gas pattern is normal. IMPRESSION: No acute abnormality.  Arthritic changes in the hips. Electronically Signed   By: Lorriane Shire M.D.   On: 05/25/2019 12:19   Dg Chest Portable 1 View   Result Date: 05/25/2019 CLINICAL DATA:  Increasing generalized weakness. EXAM: PORTABLE CHEST 1 VIEW COMPARISON:  05/23/2019 FINDINGS: The heart size and mediastinal contours are within normal limits. Both lungs are clear. Previous resection of the distal left clavicle. No significant bone abnormality. IMPRESSION: No acute abnormalities.  Clear lungs. Aortic Atherosclerosis (ICD10-I70.0). Electronically Signed   By: Lorriane Shire M.D.   On: 05/25/2019 12:20    Procedures Procedures (including critical care time)  Medications Ordered in ED Medications  sodium chloride 0.9 % bolus 500 mL (0 mLs Intravenous Stopped 05/25/19 1253)  acetaminophen (TYLENOL) suppository 650 mg (650 mg Rectal Given 05/25/19 1249)     Initial Impression / Assessment and Plan / ED Course  I have reviewed the triage vital signs and the nursing notes.  Pertinent labs & imaging results that were available during my care of the patient were reviewed by me and considered in my medical decision making (see chart for details).     Christian Wiley is an 84 year old male history of Parkinson's who presents to the ED for concern for dehydration, worsening weakness.  Patient with temperature of 100.1 but otherwise normal vitals.  Patient had recent work-up for the same and overall unremarkable work-up at that time.  Patient overall is alert and appears comfortable.  Possibly had another fall today.  However, EMS states that patient is at his baseline but has not had any good appetite and not made any urine since last night according to patient's wife.  I have been unable to get in touch with her on the phone.  Patient was all extremities.  Has a low-grade fever will perform infectious work-up, eval for dehyrdration, electrolyte issues.  We will get a CT of the head given possible fall. EKG with no acute issues.  Patient with no source for fever.  Urinalysis normal.  Coronavirus test normal.  Chest x-ray without any signs of  infection.  Patient with no leukocytosis, no lactic acidosis.  Patient is not immunocompromise.  No concern for bacteremia.  Patient does not have any rashes.  Patient felt better after IV fluids.  Talked on the phone with patient's wife and recommend continued hydration and fever control at home.  Told to return to the ED if he developed any worsening cough, infectious symptoms.  At this time patient has no complaints.  Patient already has home health in place.  Discharged from ED in good condition.  Blood cultures have been collected and family aware that we will call if anything is positive from that standpoint.  This chart was dictated using voice recognition software.  Despite best efforts to proofread,  errors can occur which can change the documentation meaning.    Final Clinical Impressions(s) / ED Diagnoses   Final diagnoses:  Fever in adult    ED Discharge Orders    None       Lennice Sites, DO 05/25/19 1511

## 2019-05-25 NOTE — TOC Transition Note (Signed)
Transition of Care [facility]) - CM/SW Discharge Note   Patient Details  Name: BERRY GODSEY MRN: [id number] Date of Birth: 07-Jul-1935  [facility] CM/SW Contact:  Fuller Mandril, RN Phone Number: 05/25/2019, 1:49 PM   Clinical Narrative:    [facility] consulted regarding setting up home health services.   Final next level of care: [place] Barriers to Discharge: Barriers Resolved   Patient Goals and CMS Choice        Discharge Placement                       Discharge Plan and Services     Post Acute Care Choice: Home Health                Issis Lindseth J. Clydene Laming, RN, BSN, [employer] [phone] Spoke with pt at bedside regarding discharge planning for [facility]. Offered pt list of home health agencies to choose from.  Pt chose [facility] to render services. Malachy Mood of [facility] notified. Patient made aware that [facility] will be in contact in 24-48 hours.  No DME needs identified at this time.      HH Arranged: RN, PT HH Agency: [facility] Date HH Agency Contacted: 05/25/19 Time [place]: 8338 Representative spoke with at [place]: [place] Determinants of Health ([place]) Interventions     Readmission Risk Interventions No flowsheet data found.

## 2019-05-25 NOTE — ED Notes (Signed)
X RAY at bedside 

## 2019-05-26 DIAGNOSIS — N138 Other obstructive and reflux uropathy: Secondary | ICD-10-CM | POA: Diagnosis not present

## 2019-05-26 DIAGNOSIS — N401 Enlarged prostate with lower urinary tract symptoms: Secondary | ICD-10-CM | POA: Diagnosis not present

## 2019-05-26 DIAGNOSIS — K219 Gastro-esophageal reflux disease without esophagitis: Secondary | ICD-10-CM | POA: Diagnosis not present

## 2019-05-26 DIAGNOSIS — F1320 Sedative, hypnotic or anxiolytic dependence, uncomplicated: Secondary | ICD-10-CM | POA: Diagnosis not present

## 2019-05-26 DIAGNOSIS — Z9049 Acquired absence of other specified parts of digestive tract: Secondary | ICD-10-CM | POA: Diagnosis not present

## 2019-05-26 DIAGNOSIS — Z87891 Personal history of nicotine dependence: Secondary | ICD-10-CM | POA: Diagnosis not present

## 2019-05-26 DIAGNOSIS — G20 Parkinson's disease: Secondary | ICD-10-CM | POA: Diagnosis not present

## 2019-05-26 DIAGNOSIS — M1990 Unspecified osteoarthritis, unspecified site: Secondary | ICD-10-CM | POA: Diagnosis not present

## 2019-05-26 DIAGNOSIS — Z9089 Acquired absence of other organs: Secondary | ICD-10-CM | POA: Diagnosis not present

## 2019-05-26 DIAGNOSIS — R531 Weakness: Secondary | ICD-10-CM | POA: Diagnosis not present

## 2019-05-26 DIAGNOSIS — G4730 Sleep apnea, unspecified: Secondary | ICD-10-CM | POA: Diagnosis not present

## 2019-05-26 DIAGNOSIS — M4726 Other spondylosis with radiculopathy, lumbar region: Secondary | ICD-10-CM | POA: Diagnosis not present

## 2019-05-26 DIAGNOSIS — Z955 Presence of coronary angioplasty implant and graft: Secondary | ICD-10-CM | POA: Diagnosis not present

## 2019-05-26 DIAGNOSIS — K5909 Other constipation: Secondary | ICD-10-CM | POA: Diagnosis not present

## 2019-05-26 DIAGNOSIS — M1A9XX0 Chronic gout, unspecified, without tophus (tophi): Secondary | ICD-10-CM | POA: Diagnosis not present

## 2019-05-26 DIAGNOSIS — G959 Disease of spinal cord, unspecified: Secondary | ICD-10-CM | POA: Diagnosis not present

## 2019-05-26 DIAGNOSIS — I951 Orthostatic hypotension: Secondary | ICD-10-CM | POA: Diagnosis not present

## 2019-05-26 DIAGNOSIS — E785 Hyperlipidemia, unspecified: Secondary | ICD-10-CM | POA: Diagnosis not present

## 2019-05-26 DIAGNOSIS — R7302 Impaired glucose tolerance (oral): Secondary | ICD-10-CM | POA: Diagnosis not present

## 2019-05-26 DIAGNOSIS — E039 Hypothyroidism, unspecified: Secondary | ICD-10-CM | POA: Diagnosis not present

## 2019-05-26 DIAGNOSIS — I10 Essential (primary) hypertension: Secondary | ICD-10-CM | POA: Diagnosis not present

## 2019-05-26 DIAGNOSIS — Z96653 Presence of artificial knee joint, bilateral: Secondary | ICD-10-CM | POA: Diagnosis not present

## 2019-05-26 DIAGNOSIS — R351 Nocturia: Secondary | ICD-10-CM | POA: Diagnosis not present

## 2019-05-26 DIAGNOSIS — R410 Disorientation, unspecified: Secondary | ICD-10-CM | POA: Diagnosis not present

## 2019-05-26 DIAGNOSIS — F329 Major depressive disorder, single episode, unspecified: Secondary | ICD-10-CM | POA: Diagnosis not present

## 2019-05-26 DIAGNOSIS — G629 Polyneuropathy, unspecified: Secondary | ICD-10-CM | POA: Diagnosis not present

## 2019-05-26 DIAGNOSIS — E663 Overweight: Secondary | ICD-10-CM | POA: Diagnosis not present

## 2019-05-30 LAB — CULTURE, BLOOD (ROUTINE X 2)
Culture: NO GROWTH
Culture: NO GROWTH
Special Requests: ADEQUATE

## 2019-06-04 DIAGNOSIS — K219 Gastro-esophageal reflux disease without esophagitis: Secondary | ICD-10-CM | POA: Diagnosis not present

## 2019-06-04 DIAGNOSIS — F329 Major depressive disorder, single episode, unspecified: Secondary | ICD-10-CM | POA: Diagnosis not present

## 2019-06-04 DIAGNOSIS — R351 Nocturia: Secondary | ICD-10-CM | POA: Diagnosis not present

## 2019-06-04 DIAGNOSIS — M1990 Unspecified osteoarthritis, unspecified site: Secondary | ICD-10-CM | POA: Diagnosis not present

## 2019-06-04 DIAGNOSIS — Z955 Presence of coronary angioplasty implant and graft: Secondary | ICD-10-CM | POA: Diagnosis not present

## 2019-06-04 DIAGNOSIS — Z87891 Personal history of nicotine dependence: Secondary | ICD-10-CM | POA: Diagnosis not present

## 2019-06-04 DIAGNOSIS — M4726 Other spondylosis with radiculopathy, lumbar region: Secondary | ICD-10-CM | POA: Diagnosis not present

## 2019-06-04 DIAGNOSIS — G4730 Sleep apnea, unspecified: Secondary | ICD-10-CM | POA: Diagnosis not present

## 2019-06-04 DIAGNOSIS — Z9089 Acquired absence of other organs: Secondary | ICD-10-CM | POA: Diagnosis not present

## 2019-06-04 DIAGNOSIS — M1A9XX0 Chronic gout, unspecified, without tophus (tophi): Secondary | ICD-10-CM | POA: Diagnosis not present

## 2019-06-04 DIAGNOSIS — E039 Hypothyroidism, unspecified: Secondary | ICD-10-CM | POA: Diagnosis not present

## 2019-06-04 DIAGNOSIS — I951 Orthostatic hypotension: Secondary | ICD-10-CM | POA: Diagnosis not present

## 2019-06-04 DIAGNOSIS — I10 Essential (primary) hypertension: Secondary | ICD-10-CM | POA: Diagnosis not present

## 2019-06-04 DIAGNOSIS — E785 Hyperlipidemia, unspecified: Secondary | ICD-10-CM | POA: Diagnosis not present

## 2019-06-04 DIAGNOSIS — N138 Other obstructive and reflux uropathy: Secondary | ICD-10-CM | POA: Diagnosis not present

## 2019-06-04 DIAGNOSIS — G959 Disease of spinal cord, unspecified: Secondary | ICD-10-CM | POA: Diagnosis not present

## 2019-06-04 DIAGNOSIS — Z9049 Acquired absence of other specified parts of digestive tract: Secondary | ICD-10-CM | POA: Diagnosis not present

## 2019-06-04 DIAGNOSIS — G629 Polyneuropathy, unspecified: Secondary | ICD-10-CM | POA: Diagnosis not present

## 2019-06-04 DIAGNOSIS — F1320 Sedative, hypnotic or anxiolytic dependence, uncomplicated: Secondary | ICD-10-CM | POA: Diagnosis not present

## 2019-06-04 DIAGNOSIS — R7302 Impaired glucose tolerance (oral): Secondary | ICD-10-CM | POA: Diagnosis not present

## 2019-06-04 DIAGNOSIS — Z96653 Presence of artificial knee joint, bilateral: Secondary | ICD-10-CM | POA: Diagnosis not present

## 2019-06-04 DIAGNOSIS — E663 Overweight: Secondary | ICD-10-CM | POA: Diagnosis not present

## 2019-06-04 DIAGNOSIS — N401 Enlarged prostate with lower urinary tract symptoms: Secondary | ICD-10-CM | POA: Diagnosis not present

## 2019-06-04 DIAGNOSIS — G20 Parkinson's disease: Secondary | ICD-10-CM | POA: Diagnosis not present

## 2019-06-04 DIAGNOSIS — K5909 Other constipation: Secondary | ICD-10-CM | POA: Diagnosis not present

## 2019-06-07 DIAGNOSIS — I10 Essential (primary) hypertension: Secondary | ICD-10-CM | POA: Diagnosis not present

## 2019-06-07 DIAGNOSIS — I951 Orthostatic hypotension: Secondary | ICD-10-CM | POA: Diagnosis not present

## 2019-06-09 ENCOUNTER — Ambulatory Visit: Payer: PPO | Admitting: Vascular Surgery

## 2019-06-09 ENCOUNTER — Other Ambulatory Visit (HOSPITAL_COMMUNITY): Payer: PPO

## 2019-06-09 DIAGNOSIS — E785 Hyperlipidemia, unspecified: Secondary | ICD-10-CM | POA: Diagnosis not present

## 2019-06-09 DIAGNOSIS — F329 Major depressive disorder, single episode, unspecified: Secondary | ICD-10-CM | POA: Diagnosis not present

## 2019-06-09 DIAGNOSIS — Z955 Presence of coronary angioplasty implant and graft: Secondary | ICD-10-CM | POA: Diagnosis not present

## 2019-06-09 DIAGNOSIS — E039 Hypothyroidism, unspecified: Secondary | ICD-10-CM | POA: Diagnosis not present

## 2019-06-09 DIAGNOSIS — R351 Nocturia: Secondary | ICD-10-CM | POA: Diagnosis not present

## 2019-06-09 DIAGNOSIS — E663 Overweight: Secondary | ICD-10-CM | POA: Diagnosis not present

## 2019-06-09 DIAGNOSIS — M4726 Other spondylosis with radiculopathy, lumbar region: Secondary | ICD-10-CM | POA: Diagnosis not present

## 2019-06-09 DIAGNOSIS — N138 Other obstructive and reflux uropathy: Secondary | ICD-10-CM | POA: Diagnosis not present

## 2019-06-09 DIAGNOSIS — K5909 Other constipation: Secondary | ICD-10-CM | POA: Diagnosis not present

## 2019-06-09 DIAGNOSIS — Z9089 Acquired absence of other organs: Secondary | ICD-10-CM | POA: Diagnosis not present

## 2019-06-09 DIAGNOSIS — G20 Parkinson's disease: Secondary | ICD-10-CM | POA: Diagnosis not present

## 2019-06-09 DIAGNOSIS — M1990 Unspecified osteoarthritis, unspecified site: Secondary | ICD-10-CM | POA: Diagnosis not present

## 2019-06-09 DIAGNOSIS — Z87891 Personal history of nicotine dependence: Secondary | ICD-10-CM | POA: Diagnosis not present

## 2019-06-09 DIAGNOSIS — G959 Disease of spinal cord, unspecified: Secondary | ICD-10-CM | POA: Diagnosis not present

## 2019-06-09 DIAGNOSIS — Z96653 Presence of artificial knee joint, bilateral: Secondary | ICD-10-CM | POA: Diagnosis not present

## 2019-06-09 DIAGNOSIS — K219 Gastro-esophageal reflux disease without esophagitis: Secondary | ICD-10-CM | POA: Diagnosis not present

## 2019-06-09 DIAGNOSIS — R7302 Impaired glucose tolerance (oral): Secondary | ICD-10-CM | POA: Diagnosis not present

## 2019-06-09 DIAGNOSIS — I951 Orthostatic hypotension: Secondary | ICD-10-CM | POA: Diagnosis not present

## 2019-06-09 DIAGNOSIS — Z9049 Acquired absence of other specified parts of digestive tract: Secondary | ICD-10-CM | POA: Diagnosis not present

## 2019-06-09 DIAGNOSIS — G4730 Sleep apnea, unspecified: Secondary | ICD-10-CM | POA: Diagnosis not present

## 2019-06-09 DIAGNOSIS — F1320 Sedative, hypnotic or anxiolytic dependence, uncomplicated: Secondary | ICD-10-CM | POA: Diagnosis not present

## 2019-06-09 DIAGNOSIS — G629 Polyneuropathy, unspecified: Secondary | ICD-10-CM | POA: Diagnosis not present

## 2019-06-09 DIAGNOSIS — N401 Enlarged prostate with lower urinary tract symptoms: Secondary | ICD-10-CM | POA: Diagnosis not present

## 2019-06-09 DIAGNOSIS — I10 Essential (primary) hypertension: Secondary | ICD-10-CM | POA: Diagnosis not present

## 2019-06-09 DIAGNOSIS — M1A9XX0 Chronic gout, unspecified, without tophus (tophi): Secondary | ICD-10-CM | POA: Diagnosis not present

## 2019-06-14 DIAGNOSIS — R351 Nocturia: Secondary | ICD-10-CM | POA: Diagnosis not present

## 2019-06-14 DIAGNOSIS — Z87891 Personal history of nicotine dependence: Secondary | ICD-10-CM | POA: Diagnosis not present

## 2019-06-14 DIAGNOSIS — E039 Hypothyroidism, unspecified: Secondary | ICD-10-CM | POA: Diagnosis not present

## 2019-06-14 DIAGNOSIS — Z955 Presence of coronary angioplasty implant and graft: Secondary | ICD-10-CM | POA: Diagnosis not present

## 2019-06-14 DIAGNOSIS — G959 Disease of spinal cord, unspecified: Secondary | ICD-10-CM | POA: Diagnosis not present

## 2019-06-14 DIAGNOSIS — G629 Polyneuropathy, unspecified: Secondary | ICD-10-CM | POA: Diagnosis not present

## 2019-06-14 DIAGNOSIS — N138 Other obstructive and reflux uropathy: Secondary | ICD-10-CM | POA: Diagnosis not present

## 2019-06-14 DIAGNOSIS — I951 Orthostatic hypotension: Secondary | ICD-10-CM | POA: Diagnosis not present

## 2019-06-14 DIAGNOSIS — E663 Overweight: Secondary | ICD-10-CM | POA: Diagnosis not present

## 2019-06-14 DIAGNOSIS — M1990 Unspecified osteoarthritis, unspecified site: Secondary | ICD-10-CM | POA: Diagnosis not present

## 2019-06-14 DIAGNOSIS — K219 Gastro-esophageal reflux disease without esophagitis: Secondary | ICD-10-CM | POA: Diagnosis not present

## 2019-06-14 DIAGNOSIS — E785 Hyperlipidemia, unspecified: Secondary | ICD-10-CM | POA: Diagnosis not present

## 2019-06-14 DIAGNOSIS — M1A9XX0 Chronic gout, unspecified, without tophus (tophi): Secondary | ICD-10-CM | POA: Diagnosis not present

## 2019-06-14 DIAGNOSIS — F329 Major depressive disorder, single episode, unspecified: Secondary | ICD-10-CM | POA: Diagnosis not present

## 2019-06-14 DIAGNOSIS — G20 Parkinson's disease: Secondary | ICD-10-CM | POA: Diagnosis not present

## 2019-06-14 DIAGNOSIS — Z9089 Acquired absence of other organs: Secondary | ICD-10-CM | POA: Diagnosis not present

## 2019-06-14 DIAGNOSIS — K5909 Other constipation: Secondary | ICD-10-CM | POA: Diagnosis not present

## 2019-06-14 DIAGNOSIS — M4726 Other spondylosis with radiculopathy, lumbar region: Secondary | ICD-10-CM | POA: Diagnosis not present

## 2019-06-14 DIAGNOSIS — G4730 Sleep apnea, unspecified: Secondary | ICD-10-CM | POA: Diagnosis not present

## 2019-06-14 DIAGNOSIS — F1320 Sedative, hypnotic or anxiolytic dependence, uncomplicated: Secondary | ICD-10-CM | POA: Diagnosis not present

## 2019-06-14 DIAGNOSIS — N401 Enlarged prostate with lower urinary tract symptoms: Secondary | ICD-10-CM | POA: Diagnosis not present

## 2019-06-14 DIAGNOSIS — Z96653 Presence of artificial knee joint, bilateral: Secondary | ICD-10-CM | POA: Diagnosis not present

## 2019-06-14 DIAGNOSIS — Z9049 Acquired absence of other specified parts of digestive tract: Secondary | ICD-10-CM | POA: Diagnosis not present

## 2019-06-14 DIAGNOSIS — R7302 Impaired glucose tolerance (oral): Secondary | ICD-10-CM | POA: Diagnosis not present

## 2019-06-14 DIAGNOSIS — I10 Essential (primary) hypertension: Secondary | ICD-10-CM | POA: Diagnosis not present

## 2019-06-16 ENCOUNTER — Ambulatory Visit: Payer: PPO | Admitting: Vascular Surgery

## 2019-06-16 ENCOUNTER — Other Ambulatory Visit (HOSPITAL_COMMUNITY): Payer: PPO

## 2019-06-17 DIAGNOSIS — N390 Urinary tract infection, site not specified: Secondary | ICD-10-CM | POA: Diagnosis not present

## 2019-06-17 DIAGNOSIS — R410 Disorientation, unspecified: Secondary | ICD-10-CM | POA: Diagnosis not present

## 2019-06-17 DIAGNOSIS — R7302 Impaired glucose tolerance (oral): Secondary | ICD-10-CM | POA: Diagnosis not present

## 2019-06-17 DIAGNOSIS — R531 Weakness: Secondary | ICD-10-CM | POA: Diagnosis not present

## 2019-06-17 DIAGNOSIS — E785 Hyperlipidemia, unspecified: Secondary | ICD-10-CM | POA: Diagnosis not present

## 2019-06-17 DIAGNOSIS — I10 Essential (primary) hypertension: Secondary | ICD-10-CM | POA: Diagnosis not present

## 2019-06-17 DIAGNOSIS — G20 Parkinson's disease: Secondary | ICD-10-CM | POA: Diagnosis not present

## 2019-06-19 ENCOUNTER — Encounter (HOSPITAL_COMMUNITY): Payer: Self-pay

## 2019-06-19 ENCOUNTER — Inpatient Hospital Stay (HOSPITAL_COMMUNITY)
Admission: EM | Admit: 2019-06-19 | Discharge: 2019-06-25 | DRG: 871 | Disposition: A | Discharge status: Home Health | Payer: PPO | Attending: Internal Medicine | Admitting: Internal Medicine

## 2019-06-19 ENCOUNTER — Emergency Department (HOSPITAL_COMMUNITY): Payer: PPO

## 2019-06-19 ENCOUNTER — Other Ambulatory Visit: Payer: Self-pay

## 2019-06-19 DIAGNOSIS — Z8349 Family history of other endocrine, nutritional and metabolic diseases: Secondary | ICD-10-CM

## 2019-06-19 DIAGNOSIS — Y92009 Unspecified place in unspecified non-institutional (private) residence as the place of occurrence of the external cause: Secondary | ICD-10-CM | POA: Diagnosis not present

## 2019-06-19 DIAGNOSIS — N179 Acute kidney failure, unspecified: Secondary | ICD-10-CM | POA: Diagnosis not present

## 2019-06-19 DIAGNOSIS — R6520 Severe sepsis without septic shock: Secondary | ICD-10-CM | POA: Diagnosis not present

## 2019-06-19 DIAGNOSIS — A419 Sepsis, unspecified organism: Secondary | ICD-10-CM | POA: Diagnosis not present

## 2019-06-19 DIAGNOSIS — E785 Hyperlipidemia, unspecified: Secondary | ICD-10-CM | POA: Diagnosis not present

## 2019-06-19 DIAGNOSIS — I2699 Other pulmonary embolism without acute cor pulmonale: Secondary | ICD-10-CM | POA: Diagnosis not present

## 2019-06-19 DIAGNOSIS — K219 Gastro-esophageal reflux disease without esophagitis: Secondary | ICD-10-CM | POA: Diagnosis present

## 2019-06-19 DIAGNOSIS — Y9301 Activity, walking, marching and hiking: Secondary | ICD-10-CM | POA: Diagnosis present

## 2019-06-19 DIAGNOSIS — F0280 Dementia in other diseases classified elsewhere without behavioral disturbance: Secondary | ICD-10-CM | POA: Diagnosis present

## 2019-06-19 DIAGNOSIS — N139 Obstructive and reflux uropathy, unspecified: Secondary | ICD-10-CM | POA: Diagnosis present

## 2019-06-19 DIAGNOSIS — E861 Hypovolemia: Secondary | ICD-10-CM | POA: Diagnosis present

## 2019-06-19 DIAGNOSIS — Z79890 Hormone replacement therapy (postmenopausal): Secondary | ICD-10-CM

## 2019-06-19 DIAGNOSIS — Z86711 Personal history of pulmonary embolism: Secondary | ICD-10-CM | POA: Diagnosis present

## 2019-06-19 DIAGNOSIS — R402411 Glasgow coma scale score 13-15, in the field [EMT or ambulance]: Secondary | ICD-10-CM | POA: Diagnosis not present

## 2019-06-19 DIAGNOSIS — Z7189 Other specified counseling: Secondary | ICD-10-CM | POA: Diagnosis not present

## 2019-06-19 DIAGNOSIS — R54 Age-related physical debility: Secondary | ICD-10-CM | POA: Diagnosis present

## 2019-06-19 DIAGNOSIS — Z79899 Other long term (current) drug therapy: Secondary | ICD-10-CM

## 2019-06-19 DIAGNOSIS — I361 Nonrheumatic tricuspid (valve) insufficiency: Secondary | ICD-10-CM | POA: Diagnosis not present

## 2019-06-19 DIAGNOSIS — I10 Essential (primary) hypertension: Secondary | ICD-10-CM | POA: Diagnosis present

## 2019-06-19 DIAGNOSIS — I4510 Unspecified right bundle-branch block: Secondary | ICD-10-CM | POA: Diagnosis present

## 2019-06-19 DIAGNOSIS — N136 Pyonephrosis: Secondary | ICD-10-CM | POA: Diagnosis not present

## 2019-06-19 DIAGNOSIS — Z888 Allergy status to other drugs, medicaments and biological substances status: Secondary | ICD-10-CM

## 2019-06-19 DIAGNOSIS — Z8679 Personal history of other diseases of the circulatory system: Secondary | ICD-10-CM

## 2019-06-19 DIAGNOSIS — R1312 Dysphagia, oropharyngeal phase: Secondary | ICD-10-CM | POA: Diagnosis present

## 2019-06-19 DIAGNOSIS — J189 Pneumonia, unspecified organism: Secondary | ICD-10-CM | POA: Diagnosis not present

## 2019-06-19 DIAGNOSIS — E871 Hypo-osmolality and hyponatremia: Secondary | ICD-10-CM | POA: Diagnosis not present

## 2019-06-19 DIAGNOSIS — Z20828 Contact with and (suspected) exposure to other viral communicable diseases: Secondary | ICD-10-CM | POA: Diagnosis present

## 2019-06-19 DIAGNOSIS — G20A1 Parkinson's disease without dyskinesia, without mention of fluctuations: Secondary | ICD-10-CM | POA: Diagnosis present

## 2019-06-19 DIAGNOSIS — F418 Other specified anxiety disorders: Secondary | ICD-10-CM | POA: Diagnosis present

## 2019-06-19 DIAGNOSIS — R627 Adult failure to thrive: Secondary | ICD-10-CM | POA: Diagnosis present

## 2019-06-19 DIAGNOSIS — R0902 Hypoxemia: Secondary | ICD-10-CM | POA: Diagnosis not present

## 2019-06-19 DIAGNOSIS — N401 Enlarged prostate with lower urinary tract symptoms: Secondary | ICD-10-CM | POA: Diagnosis present

## 2019-06-19 DIAGNOSIS — J690 Pneumonitis due to inhalation of food and vomit: Secondary | ICD-10-CM | POA: Diagnosis not present

## 2019-06-19 DIAGNOSIS — J90 Pleural effusion, not elsewhere classified: Secondary | ICD-10-CM | POA: Diagnosis not present

## 2019-06-19 DIAGNOSIS — W010XXA Fall on same level from slipping, tripping and stumbling without subsequent striking against object, initial encounter: Secondary | ICD-10-CM | POA: Diagnosis present

## 2019-06-19 DIAGNOSIS — E039 Hypothyroidism, unspecified: Secondary | ICD-10-CM | POA: Diagnosis not present

## 2019-06-19 DIAGNOSIS — I2692 Saddle embolus of pulmonary artery without acute cor pulmonale: Secondary | ICD-10-CM | POA: Diagnosis not present

## 2019-06-19 DIAGNOSIS — I82412 Acute embolism and thrombosis of left femoral vein: Secondary | ICD-10-CM | POA: Diagnosis not present

## 2019-06-19 DIAGNOSIS — G3183 Dementia with Lewy bodies: Secondary | ICD-10-CM | POA: Diagnosis present

## 2019-06-19 DIAGNOSIS — R5381 Other malaise: Secondary | ICD-10-CM | POA: Diagnosis not present

## 2019-06-19 DIAGNOSIS — R0602 Shortness of breath: Secondary | ICD-10-CM | POA: Diagnosis not present

## 2019-06-19 DIAGNOSIS — R404 Transient alteration of awareness: Secondary | ICD-10-CM | POA: Diagnosis not present

## 2019-06-19 DIAGNOSIS — M2550 Pain in unspecified joint: Secondary | ICD-10-CM | POA: Diagnosis not present

## 2019-06-19 DIAGNOSIS — Z515 Encounter for palliative care: Secondary | ICD-10-CM

## 2019-06-19 DIAGNOSIS — R000 Tachycardia, unspecified: Secondary | ICD-10-CM | POA: Diagnosis not present

## 2019-06-19 DIAGNOSIS — Z66 Do not resuscitate: Secondary | ICD-10-CM | POA: Diagnosis present

## 2019-06-19 DIAGNOSIS — I82452 Acute embolism and thrombosis of left peroneal vein: Secondary | ICD-10-CM | POA: Diagnosis present

## 2019-06-19 DIAGNOSIS — Z87891 Personal history of nicotine dependence: Secondary | ICD-10-CM

## 2019-06-19 DIAGNOSIS — I2693 Single subsegmental pulmonary embolism without acute cor pulmonale: Secondary | ICD-10-CM | POA: Diagnosis not present

## 2019-06-19 DIAGNOSIS — G20 Parkinson's disease: Secondary | ICD-10-CM | POA: Diagnosis present

## 2019-06-19 DIAGNOSIS — N400 Enlarged prostate without lower urinary tract symptoms: Secondary | ICD-10-CM | POA: Diagnosis present

## 2019-06-19 DIAGNOSIS — R4020 Unspecified coma: Secondary | ICD-10-CM | POA: Diagnosis not present

## 2019-06-19 DIAGNOSIS — R338 Other retention of urine: Secondary | ICD-10-CM | POA: Diagnosis present

## 2019-06-19 DIAGNOSIS — N1330 Unspecified hydronephrosis: Secondary | ICD-10-CM | POA: Diagnosis not present

## 2019-06-19 DIAGNOSIS — Z7401 Bed confinement status: Secondary | ICD-10-CM | POA: Diagnosis not present

## 2019-06-19 DIAGNOSIS — I82432 Acute embolism and thrombosis of left popliteal vein: Secondary | ICD-10-CM | POA: Diagnosis present

## 2019-06-19 DIAGNOSIS — Z8249 Family history of ischemic heart disease and other diseases of the circulatory system: Secondary | ICD-10-CM

## 2019-06-19 DIAGNOSIS — R0689 Other abnormalities of breathing: Secondary | ICD-10-CM | POA: Diagnosis not present

## 2019-06-19 DIAGNOSIS — F0150 Vascular dementia without behavioral disturbance: Secondary | ICD-10-CM | POA: Diagnosis present

## 2019-06-19 DIAGNOSIS — J181 Lobar pneumonia, unspecified organism: Secondary | ICD-10-CM | POA: Diagnosis not present

## 2019-06-19 HISTORY — DX: Abdominal aortic aneurysm, without rupture, unspecified: I71.40

## 2019-06-19 HISTORY — DX: Parkinson's disease without dyskinesia, without mention of fluctuations: G20.A1

## 2019-06-19 HISTORY — DX: Essential (primary) hypertension: I10

## 2019-06-19 HISTORY — DX: Parkinson's disease: G20

## 2019-06-19 HISTORY — DX: Abdominal aortic aneurysm, without rupture: I71.4

## 2019-06-19 LAB — CBC WITH DIFFERENTIAL/PLATELET
Abs Immature Granulocytes: 0.08 10*3/uL — ABNORMAL HIGH (ref 0.00–0.07)
Basophils Absolute: 0 10*3/uL (ref 0.0–0.1)
Basophils Relative: 0 %
Eosinophils Absolute: 0.1 10*3/uL (ref 0.0–0.5)
Eosinophils Relative: 0 %
HCT: 43.1 % (ref 39.0–52.0)
Hemoglobin: 13.6 g/dL (ref 13.0–17.0)
Immature Granulocytes: 1 %
Lymphocytes Relative: 34 %
Lymphs Abs: 4.9 10*3/uL — ABNORMAL HIGH (ref 0.7–4.0)
MCH: 30.6 pg (ref 26.0–34.0)
MCHC: 31.6 g/dL (ref 30.0–36.0)
MCV: 97.1 fL (ref 80.0–100.0)
Monocytes Absolute: 0.3 10*3/uL (ref 0.1–1.0)
Monocytes Relative: 2 %
Neutro Abs: 9.1 10*3/uL — ABNORMAL HIGH (ref 1.7–7.7)
Neutrophils Relative %: 63 %
Platelets: 157 10*3/uL (ref 150–400)
RBC: 4.44 MIL/uL (ref 4.22–5.81)
RDW: 14.3 % (ref 11.5–15.5)
WBC: 14.4 10*3/uL — ABNORMAL HIGH (ref 4.0–10.5)
nRBC: 0 % (ref 0.0–0.2)

## 2019-06-19 LAB — COMPREHENSIVE METABOLIC PANEL
ALT: 29 U/L (ref 0–44)
AST: 24 U/L (ref 15–41)
Albumin: 3.1 g/dL — ABNORMAL LOW (ref 3.5–5.0)
Alkaline Phosphatase: 96 U/L (ref 38–126)
Anion gap: 12 (ref 5–15)
BUN: 28 mg/dL — ABNORMAL HIGH (ref 8–23)
CO2: 20 mmol/L — ABNORMAL LOW (ref 22–32)
Calcium: 8.6 mg/dL — ABNORMAL LOW (ref 8.9–10.3)
Chloride: 98 mmol/L (ref 98–111)
Creatinine, Ser: 1.5 mg/dL — ABNORMAL HIGH (ref 0.61–1.24)
GFR calc Af Amer: 50 mL/min — ABNORMAL LOW (ref >60)
GFR calc non Af Amer: 43 mL/min — ABNORMAL LOW (ref >60)
Glucose, Bld: 150 mg/dL — ABNORMAL HIGH (ref 70–99)
Potassium: 4.5 mmol/L (ref 3.5–5.1)
Sodium: 130 mmol/L — ABNORMAL LOW (ref 135–145)
Total Bilirubin: 0.9 mg/dL (ref 0.3–1.2)
Total Protein: 5.8 g/dL — ABNORMAL LOW (ref 6.5–8.1)

## 2019-06-19 LAB — URINALYSIS, ROUTINE W REFLEX MICROSCOPIC
Bilirubin Urine: NEGATIVE
Glucose, UA: 50 mg/dL — AB
Ketones, ur: 5 mg/dL — AB
Nitrite: NEGATIVE
Protein, ur: NEGATIVE mg/dL
Specific Gravity, Urine: 1.021 (ref 1.005–1.030)
pH: 5 (ref 5.0–8.0)

## 2019-06-19 LAB — SARS CORONAVIRUS 2 BY RT PCR (HOSPITAL ORDER, PERFORMED IN ~~LOC~~ HOSPITAL LAB): SARS Coronavirus 2: NEGATIVE

## 2019-06-19 LAB — TROPONIN I (HIGH SENSITIVITY): Troponin I (High Sensitivity): 11 ng/L (ref <18)

## 2019-06-19 LAB — LACTIC ACID, PLASMA: Lactic Acid, Venous: 4.1 mmol/L (ref 0.5–1.9)

## 2019-06-19 LAB — PROTIME-INR
INR: 1.1 (ref 0.8–1.2)
Prothrombin Time: 14.5 seconds (ref 11.4–15.2)

## 2019-06-19 LAB — APTT: aPTT: 24 seconds (ref 24–36)

## 2019-06-19 MED ORDER — SODIUM CHLORIDE 0.9 % IV BOLUS
1600.0000 mL | Freq: Once | INTRAVENOUS | Status: AC
  Administered 2019-06-19: 1600 mL via INTRAVENOUS

## 2019-06-19 MED ORDER — PIPERACILLIN-TAZOBACTAM 3.375 G IVPB
3.3750 g | Freq: Three times a day (TID) | INTRAVENOUS | Status: DC
  Administered 2019-06-20 – 2019-06-21 (×3): 3.375 g via INTRAVENOUS
  Filled 2019-06-19 (×3): qty 50

## 2019-06-19 MED ORDER — SODIUM CHLORIDE 0.9 % IV BOLUS
1000.0000 mL | Freq: Once | INTRAVENOUS | Status: AC
  Administered 2019-06-19: 1000 mL via INTRAVENOUS

## 2019-06-19 MED ORDER — IOHEXOL 350 MG/ML SOLN
100.0000 mL | Freq: Once | INTRAVENOUS | Status: AC | PRN
  Administered 2019-06-19: 100 mL via INTRAVENOUS

## 2019-06-19 MED ORDER — PIPERACILLIN-TAZOBACTAM 3.375 G IVPB 30 MIN
3.3750 g | Freq: Once | INTRAVENOUS | Status: AC
  Administered 2019-06-19: 3.375 g via INTRAVENOUS
  Filled 2019-06-19: qty 50

## 2019-06-19 MED ORDER — VANCOMYCIN HCL 10 G IV SOLR
1750.0000 mg | Freq: Once | INTRAVENOUS | Status: AC
  Administered 2019-06-19: 1750 mg via INTRAVENOUS
  Filled 2019-06-19: qty 1750

## 2019-06-19 MED ORDER — VANCOMYCIN HCL 10 G IV SOLR
1750.0000 mg | INTRAVENOUS | Status: DC
  Administered 2019-06-21: 1750 mg via INTRAVENOUS
  Filled 2019-06-19: qty 1750

## 2019-06-19 NOTE — Progress Notes (Signed)
Orthopedic Tech Progress Note Patient Details:  Helen Cuff Harkless Feb 02, 1935 [id number] Ortho wasn't needed at that time. Patient ID: TAHA DIMOND, male   DOB: 1935-09-14, 84 y.o.   MRN: [id number]   Ladell Pier [facility] 06/19/2019, 11:20 PM

## 2019-06-19 NOTE — ED Triage Notes (Signed)
Pt BIB [facility] for eval of AMS w/ fall. Pt was dx'd w/ UTI 5 days PTA, pt has been taking abx as prescribed. Tonight pt had fall from standing where knees "just gave out". No head trauma. Pt lost balance and struck elbow on door frame. [facility] 13 for EMS, hx of late parkinson's disease, baseline tremors. No known covid contacts, no respiratory sx.

## 2019-06-19 NOTE — Progress Notes (Signed)
This chaplain responded to Level 2 fall in Trauma A.  The chaplain was pastorally present for the Pt.  The chaplain understands from conversation with the MD, no needs at this time.  The chaplain is available for F/U spiritual care as needed.

## 2019-06-19 NOTE — ED Notes (Signed)
Wife [name]) [phone] home, [phone] cell

## 2019-06-19 NOTE — Progress Notes (Signed)
Pharmacy Antibiotic Note  Christian Wiley is a 84 y.o. male admitted on 06/19/2019 with sepsis.  Pharmacy has been consulted for vancomycin and zosyn dosing.  Plan: Vancomycin 1750 mg IV q36 hours Zosyn EI F/u renal function, cultures and clinical course  Height: 5\' 9"  (175.3 cm) Weight: 194 lb 0.1 oz (88 kg) IBW/kg (Calculated) : 70.7  Temp (24hrs), Avg:99.8 F (37.7 C), Min:97.8 F (36.6 C), Max:101.8 F (38.8 C)  Recent Labs  Lab 06/19/19 2225  WBC 14.4*  CREATININE 1.50*  LATICACIDVEN 4.1*    Estimated Creatinine Clearance: 41.7 mL/min (A) (by C-G formula based on SCr of 1.5 mg/dL (H)).    Allergies  Allergen Reactions  . Ambien [Zolpidem Tartrate]      Thank you for allowing pharmacy to be a part of this patient's care.  Beverlee Nims 06/19/2019 11:37 PM

## 2019-06-20 ENCOUNTER — Emergency Department (HOSPITAL_COMMUNITY): Payer: PPO

## 2019-06-20 ENCOUNTER — Encounter (HOSPITAL_COMMUNITY): Payer: Self-pay | Admitting: Internal Medicine

## 2019-06-20 ENCOUNTER — Inpatient Hospital Stay (HOSPITAL_COMMUNITY): Payer: PPO

## 2019-06-20 DIAGNOSIS — Z86711 Personal history of pulmonary embolism: Secondary | ICD-10-CM | POA: Diagnosis not present

## 2019-06-20 DIAGNOSIS — J181 Lobar pneumonia, unspecified organism: Secondary | ICD-10-CM | POA: Diagnosis not present

## 2019-06-20 DIAGNOSIS — N179 Acute kidney failure, unspecified: Secondary | ICD-10-CM | POA: Diagnosis present

## 2019-06-20 DIAGNOSIS — Z20828 Contact with and (suspected) exposure to other viral communicable diseases: Secondary | ICD-10-CM | POA: Diagnosis present

## 2019-06-20 DIAGNOSIS — Y92009 Unspecified place in unspecified non-institutional (private) residence as the place of occurrence of the external cause: Secondary | ICD-10-CM | POA: Diagnosis not present

## 2019-06-20 DIAGNOSIS — Z515 Encounter for palliative care: Secondary | ICD-10-CM

## 2019-06-20 DIAGNOSIS — Z7189 Other specified counseling: Secondary | ICD-10-CM

## 2019-06-20 DIAGNOSIS — N136 Pyonephrosis: Secondary | ICD-10-CM | POA: Diagnosis present

## 2019-06-20 DIAGNOSIS — I10 Essential (primary) hypertension: Secondary | ICD-10-CM | POA: Diagnosis present

## 2019-06-20 DIAGNOSIS — R402411 Glasgow coma scale score 13-15, in the field [EMT or ambulance]: Secondary | ICD-10-CM | POA: Diagnosis present

## 2019-06-20 DIAGNOSIS — I82452 Acute embolism and thrombosis of left peroneal vein: Secondary | ICD-10-CM | POA: Diagnosis present

## 2019-06-20 DIAGNOSIS — I2693 Single subsegmental pulmonary embolism without acute cor pulmonale: Secondary | ICD-10-CM | POA: Diagnosis present

## 2019-06-20 DIAGNOSIS — I361 Nonrheumatic tricuspid (valve) insufficiency: Secondary | ICD-10-CM | POA: Diagnosis not present

## 2019-06-20 DIAGNOSIS — I82412 Acute embolism and thrombosis of left femoral vein: Secondary | ICD-10-CM | POA: Diagnosis present

## 2019-06-20 DIAGNOSIS — R1312 Dysphagia, oropharyngeal phase: Secondary | ICD-10-CM | POA: Diagnosis present

## 2019-06-20 DIAGNOSIS — R0902 Hypoxemia: Secondary | ICD-10-CM

## 2019-06-20 DIAGNOSIS — R6520 Severe sepsis without septic shock: Secondary | ICD-10-CM | POA: Diagnosis not present

## 2019-06-20 DIAGNOSIS — I2699 Other pulmonary embolism without acute cor pulmonale: Secondary | ICD-10-CM

## 2019-06-20 DIAGNOSIS — E871 Hypo-osmolality and hyponatremia: Secondary | ICD-10-CM | POA: Diagnosis present

## 2019-06-20 DIAGNOSIS — Z66 Do not resuscitate: Secondary | ICD-10-CM | POA: Diagnosis present

## 2019-06-20 DIAGNOSIS — F418 Other specified anxiety disorders: Secondary | ICD-10-CM | POA: Diagnosis present

## 2019-06-20 DIAGNOSIS — J189 Pneumonia, unspecified organism: Secondary | ICD-10-CM | POA: Diagnosis present

## 2019-06-20 DIAGNOSIS — I82432 Acute embolism and thrombosis of left popliteal vein: Secondary | ICD-10-CM | POA: Diagnosis present

## 2019-06-20 DIAGNOSIS — G3183 Dementia with Lewy bodies: Secondary | ICD-10-CM | POA: Diagnosis present

## 2019-06-20 DIAGNOSIS — J690 Pneumonitis due to inhalation of food and vomit: Secondary | ICD-10-CM | POA: Diagnosis present

## 2019-06-20 DIAGNOSIS — R54 Age-related physical debility: Secondary | ICD-10-CM | POA: Diagnosis present

## 2019-06-20 DIAGNOSIS — E039 Hypothyroidism, unspecified: Secondary | ICD-10-CM | POA: Diagnosis present

## 2019-06-20 DIAGNOSIS — Y9301 Activity, walking, marching and hiking: Secondary | ICD-10-CM | POA: Diagnosis present

## 2019-06-20 DIAGNOSIS — N400 Enlarged prostate without lower urinary tract symptoms: Secondary | ICD-10-CM | POA: Diagnosis present

## 2019-06-20 DIAGNOSIS — F0150 Vascular dementia without behavioral disturbance: Secondary | ICD-10-CM | POA: Diagnosis present

## 2019-06-20 DIAGNOSIS — R627 Adult failure to thrive: Secondary | ICD-10-CM | POA: Diagnosis present

## 2019-06-20 DIAGNOSIS — A419 Sepsis, unspecified organism: Secondary | ICD-10-CM | POA: Diagnosis present

## 2019-06-20 DIAGNOSIS — G20 Parkinson's disease: Secondary | ICD-10-CM | POA: Diagnosis present

## 2019-06-20 DIAGNOSIS — E785 Hyperlipidemia, unspecified: Secondary | ICD-10-CM | POA: Diagnosis present

## 2019-06-20 DIAGNOSIS — I2692 Saddle embolus of pulmonary artery without acute cor pulmonale: Secondary | ICD-10-CM | POA: Diagnosis present

## 2019-06-20 DIAGNOSIS — W010XXA Fall on same level from slipping, tripping and stumbling without subsequent striking against object, initial encounter: Secondary | ICD-10-CM | POA: Diagnosis present

## 2019-06-20 LAB — HEPARIN LEVEL (UNFRACTIONATED)
Heparin Unfractionated: 0.91 IU/mL — ABNORMAL HIGH (ref 0.30–0.70)
Heparin Unfractionated: 0.57 IU/mL (ref 0.30–0.70)

## 2019-06-20 LAB — CBC
HCT: 36.4 % — ABNORMAL LOW (ref 39.0–52.0)
Hemoglobin: 12 g/dL — ABNORMAL LOW (ref 13.0–17.0)
MCH: 30.7 pg (ref 26.0–34.0)
MCHC: 33 g/dL (ref 30.0–36.0)
MCV: 93.1 fL (ref 80.0–100.0)
Platelets: 121 10*3/uL — ABNORMAL LOW (ref 150–400)
RBC: 3.91 MIL/uL — ABNORMAL LOW (ref 4.22–5.81)
RDW: 14.5 % (ref 11.5–15.5)
WBC: 16 10*3/uL — ABNORMAL HIGH (ref 4.0–10.5)
nRBC: 0 % (ref 0.0–0.2)

## 2019-06-20 LAB — APTT: aPTT: 161 seconds (ref 24–36)

## 2019-06-20 LAB — PROTIME-INR
INR: 1.4 — ABNORMAL HIGH (ref 0.8–1.2)
Prothrombin Time: 17.4 seconds — ABNORMAL HIGH (ref 11.4–15.2)

## 2019-06-20 LAB — BASIC METABOLIC PANEL
Anion gap: 9 (ref 5–15)
BUN: 25 mg/dL — ABNORMAL HIGH (ref 8–23)
CO2: 22 mmol/L (ref 22–32)
Calcium: 8.1 mg/dL — ABNORMAL LOW (ref 8.9–10.3)
Chloride: 104 mmol/L (ref 98–111)
Creatinine, Ser: 1.42 mg/dL — ABNORMAL HIGH (ref 0.61–1.24)
GFR calc Af Amer: 53 mL/min — ABNORMAL LOW (ref >60)
GFR calc non Af Amer: 46 mL/min — ABNORMAL LOW (ref >60)
Glucose, Bld: 136 mg/dL — ABNORMAL HIGH (ref 70–99)
Potassium: 3.6 mmol/L (ref 3.5–5.1)
Sodium: 135 mmol/L (ref 135–145)

## 2019-06-20 LAB — LACTIC ACID, PLASMA: Lactic Acid, Venous: 1.6 mmol/L (ref 0.5–1.9)

## 2019-06-20 LAB — ECHOCARDIOGRAM COMPLETE
Height: 72 in
Weight: 3015.89 oz

## 2019-06-20 LAB — TSH: TSH: 1.781 u[IU]/mL (ref 0.350–4.500)

## 2019-06-20 LAB — PROCALCITONIN: Procalcitonin: 15.51 ng/mL

## 2019-06-20 LAB — TROPONIN I (HIGH SENSITIVITY): Troponin I (High Sensitivity): 19 ng/L — ABNORMAL HIGH (ref <18)

## 2019-06-20 MED ORDER — HEPARIN BOLUS VIA INFUSION
4000.0000 [IU] | Freq: Once | INTRAVENOUS | Status: AC
  Administered 2019-06-20: 4000 [IU] via INTRAVENOUS
  Filled 2019-06-20: qty 4000

## 2019-06-20 MED ORDER — ACETAMINOPHEN 325 MG PO TABS: 650.0000 mg | ORAL_TABLET | Freq: Four times a day (QID) | ORAL | Status: DC | PRN

## 2019-06-20 MED ORDER — ACETAMINOPHEN 650 MG RE SUPP: 650.0000 mg | Freq: Four times a day (QID) | RECTAL | Status: DC | PRN

## 2019-06-20 MED ORDER — HEPARIN (PORCINE) 25000 UT/250ML-% IV SOLN
1200.0000 [IU]/h | INTRAVENOUS | Status: AC
  Administered 2019-06-20: 20:00:00 1200 [IU]/h via INTRAVENOUS
  Administered 2019-06-20: 1400 [IU]/h via INTRAVENOUS
  Filled 2019-06-20 (×2): qty 250

## 2019-06-20 MED ORDER — LEVOTHYROXINE SODIUM 100 MCG/5ML IV SOLN
12.5000 ug | Freq: Every day | INTRAVENOUS | Status: DC
  Administered 2019-06-20: 12.5 ug via INTRAVENOUS
  Filled 2019-06-20 (×2): qty 5

## 2019-06-20 MED ORDER — IOHEXOL 350 MG/ML SOLN
100.0000 mL | Freq: Once | INTRAVENOUS | Status: AC | PRN
  Administered 2019-06-20: 60 mL via INTRAVENOUS

## 2019-06-20 MED ORDER — SODIUM CHLORIDE 0.9% FLUSH
3.0000 mL | Freq: Two times a day (BID) | INTRAVENOUS | Status: DC
  Administered 2019-06-20 – 2019-06-23 (×4): 3 mL via INTRAVENOUS

## 2019-06-20 NOTE — Progress Notes (Signed)
Modified Barium Swallow Progress Note  Patient Details  Name: Christian Wiley MRN: [id number] Date of Birth: 1935-07-24  Today's Date: 06/20/2019  Modified Barium Swallow completed.  Full report located under Chart Review in the Imaging Section.  Brief recommendations include the following:  Clinical Impression  Pt presents with mild oropharyngeal dysphagia c/b nonrotary mastication, decreased lingual coordination, and delayed swallow initiation.  These deficits resulted in impaired bolus formation and delayed A-P transit, swallow initiation at the vallecula with solids, and piecemeal deglutition.  Pharyngeal residue was cleared with spontaneous subsequent swallows.  There was no penetration or aspiration with any consistencies.  Pt was noted to cough during evaluation in absence of penetration/aspiration. Recommend regular texture diet with thin liquid.   Swallow Evaluation Recommendations       SLP Diet Recommendations: Regular solids;Thin liquid   Liquid Administration via: Cup;Straw   Medication Administration: Whole meds with liquid   Supervision: Staff to assist with self feeding   Compensations: Minimize environmental distractions;Slow rate;Small sips/bites   Postural Changes: Seated upright at 90 degrees   Oral Care Recommendations: Oral care BID        Celedonio Savage, [place], [place] Office: [phone]; Pager (7/12): [phone] 06/20/2019,11:04 AM

## 2019-06-20 NOTE — Progress Notes (Signed)
PROGRESS NOTE    Christian Wiley  WNU:[id number] DOB: [birth year] DOA: 06/19/2019 PCP: [name], MD    Brief Narrative:   Christian Wiley is a 84 y.o. male with medical history significant for vascular dementia, Parkinson's disease, hypothyroidism, GERD, hyperlipidemia, BPH, RBBB, history of PE not on anticoagulation, AAA status post endovascular repair, depression who presents to the ED for evaluation of altered mental status after a fall.  Patient is unable to provide any history therefore entirety of history is obtained from EDP, chart review, and wife by phone.  Wife states that patient at baseline is nonverbal and only occasionally alert.  He needs help with all of his ADLs but can walk with the use of a walker.  She states that he seems to be choking every time he eats.  She says that he has been treated for UTI with cefdinir completing day 6 of 7 so far.  She says today while walking with his walker he began to fall forwards.  Luckily his wife was by and was unable to catch him and slowly sliding down to the ground.  She says he did have a small abrasion to his elbow but did not hit his head.  She called EMS and per EDP patient was noted to be hypoxic requiring nonrebreather and was brought to the ED for further evaluation.  ED Course: Initial vitals showed BP 133/55, pulse 88, RR 30, temp 101.8 Fahrenheit rectally, SPO2 97% on 4 L supplemental O2 via [place].  Labs are notable for WBC 14.4, hemoglobin 13.6, platelets 157,000, sodium 130, potassium 4.5, bicarb 20, BUN 28, creatinine 1.5, serum glucose 150, high-sensitivity troponin IL-11, lactic acid 4.1, SARS-CoV-2 test negative.  Urinalysis showed negative nitrites, trace leukocytes, 21-50 RBC/hpf, 0-5 WBC /hpf, rare bacteria on microscopy.  Blood cultures were drawn and pending.  Portable chest x-ray showed mild patchy bilateral lower lung opacities.  CT head without contrast was negative for acute intracranial  abnormality.  Atrophy and mild small vessel ischemic changes of the white matter was noted.  CTA chest PE study showed an acute PE involving the distal right main pulmonary artery with extension into the right lobar artery and upper and lower lobe segmental and subsegmental branch vessels.  A small left lower lobe subsegmental PE was also seen.  CT findings were positive for evidence of right heart strain consistent with at least submassive PE.  Changes suggestive of multifocal pneumonia worse in the bilateral lower lobes was also seen.  Patient was given 2.6 L normal saline and started on IV vancomycin and Zosyn.  PCCM were consulted who recommended hospitalist admission.  The hospitalist service was consulted to admit for further evaluation and management.  Assessment & Plan:   Principal Problem:   Sepsis due to pneumonia [facility]) Active Problems:   Parkinson disease ([place])   Vascular dementia ([place])   Hypothyroidism   History of pulmonary embolus (PE)   Depression with anxiety   Hyperlipidemia   BPH (benign prostatic hyperplasia)   AKI (acute kidney injury) ([place])   Acute pulmonary embolism (HCC)   Submassive bilateral pulmonary embolism with right heart strain Acute DVT left common femoral vein, left popliteal vein, left peroneal vein With CT evidence of right heart strain.  Patient has a history of PE and per wife was on Xarelto until  1 year ago which was discontinued due to a type II endoleak found on angiography seemingly resolution of the leak after holding anticoagulation.  Vascular ultrasound duplex bilateral lower  extremities notable for acute DVT on left. --PCCM following, appreciate assistance --Transthoracic echocardiogram pending --Continue IV heparin drip while n.p.o.; transition to [place] if able, pharmacy consulted for assistance --Continue supplemental oxygen; titrate for SPO2 greater than 92%  Pneumonia, multifocal; possible aspiration Wife reports behavior suggestive of  aspiration.  CT PA notable for multifocal pneumonia.  Procalcitonin elevated at 15.50 with a lactic acid of 1.6.  COVID-19 test negative. --WBC 14.4-->16.0 --Blood cultures x 2: Pending --Check MRSA PCR --Continue broad-spectrum antibiotics with vancomycin and Zosyn for now --Keep n.p.o. for now pending speech evaluation --Daily CBC  Dysphasia with possible aspiration: Discussed with patient's spouse, Christian Wiley today regarding his issues on swallowing.  She has noticed that he has significant cough while eating that has been present over the past 2 weeks.  Concern for possible aspiration given pneumonia as above.  Discussed with her that if he is unable to eat or drink orally, that he has advanced directives that would decline use of artificial means with tube feeding. --Speech therapy following, plan MBSS today --Continue n.p.o. for now  Acute kidney injury: Creatinine 1.50 on admission. Likely prerenal, received 2.6 L normal saline in the ED.   --Creatinine 1.50-->1.42; improved --Avoid nephrotoxins, renally dose all medications --Daily BMP  Hyponatremia: Likely from hypovolemia, received 2.6 L normal saline bolus in ED. --Na 130-->135 --Continue to monitor daily BMP  Hyperlipidemia: --Holding home atorvastatin 40 mg daily while n.p.o.  Hypothyroidism: --Convert home Synthroid 25 mcg to IV 12.5 mcg while n.p.o.  Depression: --Holding home Lexapro 10 mg daily  Vascular Parkinsonism and dementia with behavioral disturbance Failure to thrive/progressive functional decline Patient with advanced vascular parkinsonism and dementia, now essentially nonverbal and unable to perform ADLs per wife.  Is able to ambulate some with the use of a walker otherwise quality of life is poor.  I do long discussion with his wife by phone who states that he has previously told his wife and daughter that in the event of cardiac arrest or significant respiratory distress he would not want to be  resuscitated or have artificial means of nutrition. --Holding home Nuplazid 34 mg daily --Palliative care consult for assistance with further medical decision making/goals of care and family support    DVT prophylaxis: Heparin drip Code Status: DNR Family Communication: Discussed with patient's spouse, Christian Wiley over telephone today Disposition Plan: To be determined   Consultants:   Palliative care  Procedures:  VAS Bilateral LE Duplex Ultrasound Summary: Right: There is no evidence of deep vein thrombosis in the lower extremity. Left: Findings consistent with acute deep vein thrombosis involving the left common femoral vein, left popliteal vein, and left peroneal veins. Mobile thrombus noted in the left common femoral and external iliac veins  Transthoracic echocardiogram: Pending  MBSS: pending  Antimicrobials:   Vancomycin 7/11  Zosyn 7/11   Subjective:  Patient seen and examined at bedside, pleasantly confused.  No specific complaints at this time but underlying dementia limits review of systems.  Discussed with spouse, Christian Wiley via telephone this morning regarding precipitating events.  She has noted that he has been coughing much more lately with meals over the past 2 weeks.  Updated spouse on positive DVT findings on duplex ultrasound today and awaiting speech eval with MBSS for further guidance on oral intake.  Spouse did reiterate that her husband would not want any means of artificial nutrition if he was unable to take in orally and that he would not want any aggressive resuscitative measures.  She also  request visitation given his critical illness, which is very reasonable.  No other complaints/concerns.  No acute events overnight per nursing staff.  Objective: Vitals:   06/20/19 0234 06/20/19 0310 06/20/19 0555 06/20/19 0730  BP:  135/66 (!) 163/72 (!) 167/72  Pulse:  83 68 72  Resp:  (!) 33 (!) 25 (!) 25  Temp: 100 F (37.8 C) 97.7 F (36.5 C) (!) 97.5 F (36.4 C)  (!) 97.5 F (36.4 C)  TempSrc: Oral Oral Oral Oral  SpO2:  98% 100% 100%  Weight:  85.4 kg 85.5 kg   Height:  6' (1.829 m)      Intake/Output Summary (Last 24 hours) at 06/20/2019 1045 Last data filed at 06/20/2019 0900 Gross per 24 hour  Intake 3073.13 ml  Output 0 ml  Net 3073.13 ml   Filed Weights   06/19/19 2223 06/20/19 0310 06/20/19 0555  Weight: 88 kg 85.4 kg 85.5 kg    Examination:  General exam: Appears calm and comfortable, pleasantly confused Respiratory system: Coarse breath sounds bilaterally, slightly decreased bilateral bases, no wheezing, normal respiratory effort, on 4 L nasal cannula Cardiovascular system: S1 & S2 heard, RRR. No JVD, murmurs, rubs, gallops or clicks. No pedal edema. Gastrointestinal system: Abdomen is nondistended, soft and nontender. No organomegaly or masses felt. Normal bowel sounds heard. Central nervous system: Alert, pleasantly confused, no focal neurological deficits Extremities: Symmetric 5 x 5 power. Skin: No rashes, lesions or ulcers Psychiatry: Pleasantly confused, normal mood/affect    Data Reviewed: I have personally reviewed following labs and imaging studies  CBC: Recent Labs  Lab 06/19/19 2225 06/20/19 0353  WBC 14.4* 16.0*  NEUTROABS 9.1*  --   HGB 13.6 12.0*  HCT 43.1 36.4*  MCV 97.1 93.1  PLT 157 831*   Basic Metabolic Panel: Recent Labs  Lab 06/19/19 2225 06/20/19 0353  NA 130* 135  K 4.5 3.6  CL 98 104  CO2 20* 22  GLUCOSE 150* 136*  BUN 28* 25*  CREATININE 1.50* 1.42*  CALCIUM 8.6* 8.1*   GFR: Estimated Creatinine Clearance: 44 mL/min (A) (by C-G formula based on SCr of 1.42 mg/dL (H)). Liver Function Tests: Recent Labs  Lab 06/19/19 2225  AST 24  ALT 29  ALKPHOS 96  BILITOT 0.9  PROT 5.8*  ALBUMIN 3.1*   No results for input(s): LIPASE, AMYLASE in the last 168 hours. No results for input(s): AMMONIA in the last 168 hours. Coagulation Profile: Recent Labs  Lab 06/19/19 2225  06/20/19 0353  INR 1.1 1.4*   Cardiac Enzymes: No results for input(s): CKTOTAL, CKMB, CKMBINDEX, TROPONINI in the last 168 hours. BNP (last 3 results) No results for input(s): PROBNP in the last 8760 hours. HbA1C: No results for input(s): HGBA1C in the last 72 hours. CBG: No results for input(s): GLUCAP in the last 168 hours. Lipid Profile: No results for input(s): CHOL, HDL, LDLCALC, TRIG, CHOLHDL, LDLDIRECT in the last 72 hours. Thyroid Function Tests: Recent Labs    06/20/19 0353  TSH 1.781   Anemia Panel: No results for input(s): VITAMINB12, FOLATE, FERRITIN, TIBC, IRON, RETICCTPCT in the last 72 hours. Sepsis Labs: Recent Labs  Lab 06/19/19 2225 06/20/19 0040 06/20/19 0353  PROCALCITON  --   --  15.51  LATICACIDVEN 4.1* 1.6  --     Recent Results (from the past 240 hour(s))  SARS Coronavirus 2 (CEPHEID- Performed in [place] hospital lab), Hosp Order     Status: None   Collection Time: 06/19/19 10:28 PM  Specimen: Urine, Catheterized; Nasopharyngeal  Result Value Ref Range Status   SARS Coronavirus 2 NEGATIVE NEGATIVE Final    Comment: (NOTE) If result is NEGATIVE SARS-CoV-2 target nucleic acids are NOT DETECTED. The SARS-CoV-2 RNA is generally detectable in upper and lower  respiratory specimens during the acute phase of infection. The lowest  concentration of SARS-CoV-2 viral copies this assay can detect is 250  copies / mL. A negative result does not preclude SARS-CoV-2 infection  and should not be used as the sole basis for treatment or other  patient management decisions.  A negative result may occur with  improper specimen collection / handling, submission of specimen other  than nasopharyngeal swab, presence of viral mutation(s) within the  areas targeted by this assay, and inadequate number of viral copies  (<250 copies / mL). A negative result must be combined with clinical  observations, patient history, and epidemiological information. If  result is POSITIVE SARS-CoV-2 target nucleic acids are DETECTED. The SARS-CoV-2 RNA is generally detectable in upper and lower  respiratory specimens dur ing the acute phase of infection.  Positive  results are indicative of active infection with SARS-CoV-2.  Clinical  correlation with patient history and other diagnostic information is  necessary to determine patient infection status.  Positive results do  not rule out bacterial infection or co-infection with other viruses. If result is PRESUMPTIVE POSTIVE SARS-CoV-2 nucleic acids MAY BE PRESENT.   A presumptive positive result was obtained on the submitted specimen  and confirmed on repeat testing.  While 2019 novel coronavirus  (SARS-CoV-2) nucleic acids may be present in the submitted sample  additional confirmatory testing may be necessary for epidemiological  and / or clinical management purposes  to differentiate between  SARS-CoV-2 and other Sarbecovirus currently known to infect humans.  If clinically indicated additional testing with an alternate test  methodology [phone]) is advised. The SARS-CoV-2 RNA is generally  detectable in upper and lower respiratory sp ecimens during the acute  phase of infection. The expected result is Negative. Fact Sheet for Patients:  StrictlyIdeas.no Fact Sheet for Healthcare Providers: BankingDealers.co.za This test is not yet approved or cleared by the [place] FDA and has been authorized for detection and/or diagnosis of SARS-CoV-2 by FDA under an Emergency Use Authorization (EUA).  This EUA will remain in effect (meaning this test can be used) for the duration of the COVID-19 declaration under Section 564(b)(1) of the Act, 21 U.S.C. section 360bbb-3(b)(1), unless the authorization is terminated or revoked sooner. Performed at [facility], [place] [address] [place], [place] [address]          Radiology Studies: Ct Head Wo  Contrast  Result Date: 06/20/2019 CLINICAL DATA:  Altered LOC EXAM: CT HEAD WITHOUT CONTRAST TECHNIQUE: Contiguous axial images were obtained from the base of the skull through the vertex without intravenous contrast. COMPARISON:  None. FINDINGS: Brain: No acute territorial infarction, hemorrhage or intracranial mass. Moderate atrophy. Mild small vessel ischemic changes of the white matter. Prominent ventricles secondary to atrophy Vascular: No hyperdense vessels.  Carotid vascular calcification Skull: Normal. Negative for fracture or focal lesion. Sinuses/Orbits: Chronic appearing deformity of right maxillary sinus. Sinuses are clear Other: None IMPRESSION: 1. No CT evidence for acute intracranial abnormality. 2. Atrophy and mild small vessel ischemic changes of the white matter Electronically Signed   By: Donavan Foil M.D.   On: 06/20/2019 00:40   Ct Angio Chest Pe W And/or Wo Contrast  Result Date: 06/20/2019 CLINICAL DATA:  Shortness of breath EXAM: CT ANGIOGRAPHY CHEST WITH CONTRAST TECHNIQUE: Multidetector CT imaging of the chest was performed using the standard protocol during bolus administration of intravenous contrast. Multiplanar CT image reconstructions and MIPs were obtained to evaluate the vascular anatomy. CONTRAST:  71mL OMNIPAQUE IOHEXOL 350 MG/ML SOLN COMPARISON:  Chest x-ray 06/19/2019 FINDINGS: Cardiovascular: Satisfactory opacification of the pulmonary arteries to the segmental level. Acute saddle embolus on the right, with thrombus visualized in the right upper lobe segmental branch vessels, distal right main pulmonary artery, descending right pulmonary artery and right lower lobe segmental and subsegmental branch vessels. Small nonocclusive emboli within left lower lobe subsegmental branch vessels. RV LV ratio is elevated at 1.3. Moderate aortic atherosclerosis without aneurysm. Coronary vascular calcification. Normal heart size. No pericardial effusion Mediastinum/Nodes: Midline  trachea. No thyroid mass. No significant adenopathy. Mild distal esophageal thickening with hiatal hernia Lungs/Pleura: Consolidation and ground-glass density in the bilateral lower lobes. Central lobular foci of ground-glass density and nodularity within the bilateral upper lobes, also suspicious for pneumonia. No pleural effusion. Upper Abdomen: No acute abnormality. Musculoskeletal: Degenerative changes. No acute or suspicious abnormality. Review of the MIP images confirms the above findings. IMPRESSION: 1. Positive for acute pulmonary emboli involving the distal right main pulmonary artery with extension of thrombus into right inter lobar artery and upper and lower lobe segmental and subsegmental branch vessels. Small subsegmental pulmonary emboli in the left lower lobe. Positive for acute PE with CT evidence of right heart strain (RV/LV Ratio = 1.3) consistent with at least submassive (intermediate risk) PE. The presence of right heart strain has been associated with an increased risk of morbidity and mortality. Please activate Code PE by paging [phone]. 2. Multifocal pneumonia, worst in the bilateral lower lobes. Critical Value/emergent results were called by telephone at the time of interpretation on 06/20/2019 at 12:52 am to Dr. Franchot Heidelberg , who verbally acknowledged these results. Aortic Atherosclerosis (ICD10-I70.0). Electronically Signed   By: Donavan Foil M.D.   On: 06/20/2019 00:52   Dg Chest Portable 1 View  Result Date: 06/19/2019 CLINICAL DATA:  Shortness of breath EXAM: PORTABLE CHEST 1 VIEW COMPARISON:  None. FINDINGS: Mild patchy lingular/left lower lobe opacity. Mild right basilar opacity. No frank interstitial edema.  No pleural effusion or pneumothorax. The heart is normal in size. Degenerative changes of the thoracic spine. IMPRESSION: Mild patchy bilateral lower lung opacities, suspicious for pneumonia. Electronically Signed   By: Julian Hy M.D.   On: 06/19/2019 22:32    Vas [place] Lower Extremity Venous (dvt)  Result Date: 06/20/2019  Lower Venous Study Indications: Pulmonary embolism.  Comparison Study: No prior study on file for comparison. Performing Technologist: Sharion Dove RVS  Examination Guidelines: A complete evaluation includes B-mode imaging, spectral Doppler, color Doppler, and power Doppler as needed of all accessible portions of each vessel. Bilateral testing is considered an integral part of a complete examination. Limited examinations for reoccurring indications may be performed as noted.  +---------+---------------+---------+-----------+----------+-------+  RIGHT     Compressibility Phasicity Spontaneity Properties Summary  +---------+---------------+---------+-----------+----------+-------+  CFV       Full            Yes       Yes                             +---------+---------------+---------+-----------+----------+-------+  SFJ       Full                                                      +---------+---------------+---------+-----------+----------+-------+  FV Prox   Full                                                      +---------+---------------+---------+-----------+----------+-------+  FV Mid    Full                                                      +---------+---------------+---------+-----------+----------+-------+  FV Distal Full                                                      +---------+---------------+---------+-----------+----------+-------+  PFV       Full                                                      +---------+---------------+---------+-----------+----------+-------+  POP       Full            Yes       Yes                             +---------+---------------+---------+-----------+----------+-------+  PTV       Full                                                      +---------+---------------+---------+-----------+----------+-------+  PERO      Full                                                       +---------+---------------+---------+-----------+----------+-------+   +---------+---------------+---------+-----------+----------+------------------+  LEFT      Compressibility Phasicity Spontaneity Properties Summary             +---------+---------------+---------+-----------+----------+------------------+  CFV       Partial                                          Mobile acute                                                                    thrombus            +---------+---------------+---------+-----------+----------+------------------+  SFJ       Full                                                                 +---------+---------------+---------+-----------+----------+------------------+  FV Prox   Full                                                                 +---------+---------------+---------+-----------+----------+------------------+  FV Mid    Full                                                                 +---------+---------------+---------+-----------+----------+------------------+  FV Distal Full                                                                 +---------+---------------+---------+-----------+----------+------------------+  PFV       Full                                                                 +---------+---------------+---------+-----------+----------+------------------+  POP       Partial         Yes       Yes                    Acute               +---------+---------------+---------+-----------+----------+------------------+  PTV       Full                                                                 +---------+---------------+---------+-----------+----------+------------------+  PERO      None                                             Acute               +---------+---------------+---------+-----------+----------+------------------+  EIV       Partial                                          mobile acute         +---------+---------------+---------+-----------+----------+------------------+  CIV                       Yes       Yes                    distal              +---------+---------------+---------+-----------+----------+------------------+  Summary: Right: There is no evidence of deep vein thrombosis in the lower extremity. Left: Findings consistent with acute deep vein thrombosis involving the left common femoral vein, left popliteal vein, and left peroneal veins. Mobile thrombus noted in the left common femoral and external iliac veins  *See table(s) above for measurements and observations.    Preliminary         Scheduled Meds:  levothyroxine  12.5 mcg Intravenous Daily   sodium chloride flush  3 mL Intravenous Q12H   Continuous Infusions:  heparin 1,400 Units/hr (06/20/19 0338)   piperacillin-tazobactam (ZOSYN)  IV 3.375 g (06/20/19 0808)   [START ON 06/21/2019] vancomycin       LOS: 0 days    Time spent: 26 minutes    Reonna Finlayson J [place] DO [facility] Pager [phone]  If 7PM-7AM, please contact night-coverage [email or web address] Password [facility] 06/20/2019, 10:45 AM

## 2019-06-20 NOTE — Progress Notes (Signed)
ANTICOAGULATION CONSULT NOTE - Initial Consult  Pharmacy Consult for heparin Indication: pulmonary embolus  Allergies  Allergen Reactions  . Ambien [Zolpidem Tartrate]     Patient Measurements: Height: 6' (182.9 cm) Weight: 188 lb 7.9 oz (85.5 kg) IBW/kg (Calculated) : 77.6  Vital Signs: Temp: 97.5 F (36.4 C) (07/12 0730) Temp Source: Oral (07/12 0730) BP: 167/72 (07/12 0730) Pulse Rate: 72 (07/12 0730)  Labs: Recent Labs    06/19/19 2225 06/20/19 0040 06/20/19 0353 06/20/19 1110  HGB 13.6  --  12.0*  --   HCT 43.1  --  36.4*  --   PLT 157  --  121*  --   APTT 24  --  161*  --   LABPROT 14.5  --  17.4*  --   INR 1.1  --  1.4*  --   HEPARINUNFRC  --   --   --  0.91*  CREATININE 1.50*  --  1.42*  --   TROPONINIHS 11 19*  --   --     Estimated Creatinine Clearance: 44 mL/min (A) (by C-G formula based on SCr of 1.42 mg/dL (H)).   Medical History: Past Medical History:  Diagnosis Date  . Abdominal aortic aneurysm, without rupture ([place])   . HTN (hypertension)   . Parkinson disease ([place])   . Vascular dementia [facility])     Assessment: 84yo male presents to ED for weakness, found to be hypoxic >> CT reveals PNA and PE w/ evidence of RHS. Pharmacy consulted for heparin management. Patient with supratherapeutic heparin level this AM. CBC with slight decrease in Hgb from 13.6 to 12, pltc down from 157 to 121. No issues with heparin infusion or s/sx of bleeding per RN this AM.  Goal of Therapy:  Heparin level 0.3-0.7 units/ml Monitor platelets by anticoagulation protocol: Yes   Plan:  Decrease heparin infusion to 1200 units/hr F/u heparin level in 8 hours Monitor daily heparin level and CBC Monitor for s/sx of bleeding  Thank you for allowing pharmacy to be a part of this patient's care.  Leron Croak, PharmD PGY2 Oncology/Hematology Pharmacy Resident 06/20/2019 11:43 AM

## 2019-06-20 NOTE — Evaluation (Signed)
Clinical/Bedside Swallow Evaluation Patient Details  Name: Christian Wiley MRN: [id number] Date of Birth: December 08, 1935  Today's Date: 06/20/2019 Time: SLP Start Time (ACUTE ONLY): 0902 SLP Stop Time (ACUTE ONLY): 0909 SLP Time Calculation (min) (ACUTE ONLY): 7 min  Past Medical History:  Past Medical History:  Diagnosis Date  . Abdominal aortic aneurysm, without rupture ([place])   . HTN (hypertension)   . Parkinson disease ([place])   . Vascular dementia [facility])    Past Surgical History: History reviewed. No pertinent surgical history. HPI:  Christian Wiley is a 84 y.o. male with medical history significant for vascular dementia, Parkinson's disease, hypothyroidism, GERD, hyperlipidemia, BPH, RBBB, history of PE not on anticoagulation, AAA status post endovascular repair, depression who presents to the ED for evaluation of altered mental status after a fall.  Pt is nonverbal at baseline. Head CT 7/12 with no acute findings.  CXR 7/11 with BLL opacities concerning for pneumonia.  Per chart review, pt has hx of aspiration as reported by wife, but there is no further information available regarding baseline swallow function.   Assessment / Plan / Recommendation Clinical Impression  Pt presents with mild oral dysphagia and clinical indicators of pharyngeal dysphagia.  With regular solid trails, pt exhibited frontal munching patter and nonrotary chewing, but acheived adequate oral clearance.  No difficulties were noted with puree consistency.  With thin liquid, pt exhibited difficulty with cup sips but was able to siphon from straw.  Pt requried multiple swallows and there was a mild, delayed throat clear x1.  Pt has hx PD and dementia and CXR 7/12 is concerning for pneumonia.  Per chart review, pt has hx of aspiration and wife has noted coughing with PO intake.  Unfortunately, no additional information regarding swallow function is available in Care Everywhere at this time.  Pt would benefit from MBSS prior  to initiation of PO diet.  Instrumental swallow study to be completed later today. SLP Visit Diagnosis: Dysphagia, oropharyngeal phase (R13.12)    Aspiration Risk  Moderate aspiration risk    Diet Recommendation NPO   Medication Administration: Via alternative means    Other  Recommendations Oral Care Recommendations: Oral care BID   Follow up Recommendations   Pending results of MBSS     Frequency and Duration   Pending results of MBSS         Prognosis   Pending results of MBSS     Swallow Study   General Date of Onset: 06/19/19 HPI: Christian Wiley is a 84 y.o. male with medical history significant for vascular dementia, Parkinson's disease, hypothyroidism, GERD, hyperlipidemia, BPH, RBBB, history of PE not on anticoagulation, AAA status post endovascular repair, depression who presents to the ED for evaluation of altered mental status after a fall.  Pt is nonverbal at baseline. Head CT 7/12 with no acute findings.  CXR 7/11 with BLL opacities concerning for pneumonia.  Per chart review, pt has hx of aspiration as reported by wife, but there is no further information available regarding baseline swallow function. Type of Study: Bedside Swallow Evaluation Diet Prior to this Study: NPO Temperature Spikes Noted: No Respiratory Status: Nasal cannula History of Recent Intubation: No Behavior/Cognition: Pleasant mood;Requires cueing;Alert;Cooperative Oral Cavity Assessment: Within Functional Limits Oral Cavity - Dentition: Missing dentition(dentures not available) Patient Positioning: Upright in bed Baseline Vocal Quality: Normal Volitional Cough: Weak Volitional Swallow: Able to elicit    Oral/Motor/Sensory Function Overall Oral Motor/Sensory Function: Mild impairment Facial ROM: Reduced left Facial Symmetry: Abnormal symmetry  left Lingual ROM: (CNT) Lingual Symmetry: (CNT) Lingual Strength: (CNT) Velum: Within Functional Limits Mandible: Within Functional Limits   Ice  Chips Ice chips: Not tested   Thin Liquid Thin Liquid: Impaired Pharyngeal  Phase Impairments: Throat Clearing - Delayed;Multiple swallows    Nectar Thick Nectar Thick Liquid: Not tested   Honey Thick Honey Thick Liquid: Not tested   Puree Puree: Within functional limits Presentation: Spoon   Solid     Solid: Impaired Oral Phase Impairments: Impaired mastication Oral Phase Functional Implications: Impaired mastication      Celedonio Savage, MA, [place] Office: [phone]; Pager [phone] 06/20/2019,9:22 AM

## 2019-06-20 NOTE — ED Notes (Signed)
ED TO INPATIENT HANDOFF REPORT  ED Nurse Name and Phone #: [phone]  S Name/Age/Gender Christian Wiley 84 y.o. male Room/Bed: 017C/017C  Code Status   Code Status: Not on file  Home/SNF/Other Home Patient oriented to: self Is this baseline? Yes   Triage Complete: Triage complete  Chief Complaint LEVEL 2  Triage Note Pt BIB [facility] for eval of AMS w/ fall. Pt was dx'd w/ UTI 5 days PTA, pt has been taking abx as prescribed. Tonight pt had fall from standing where knees "just gave out". No head trauma. Pt lost balance and struck elbow on door frame. [facility] 13 for EMS, hx of late parkinson's disease, baseline tremors. No known covid contacts, no respiratory sx.    Allergies Allergies  Allergen Reactions  . Ambien [Zolpidem Tartrate]     Level of Care/Admitting Diagnosis ED Disposition    ED Disposition Condition [facility]: [place] [[id number]]  Level of Care: Progressive [102]  Covid Evaluation: Confirmed COVID Negative  Diagnosis: Sepsis due to pneumonia [facility]) [[id number]]  Admitting Physician: Lenore Cordia [[id number]]  Attending Physician: Lenore Cordia [[id number]]  Estimated length of stay: past midnight tomorrow  Certification:: I certify this patient will need inpatient services for at least 2 midnights  PT Class (Do Not Modify): Inpatient [101]  PT Acc Code (Do Not Modify): Private [1]       B Medical/Surgery History Past Medical History:  Diagnosis Date  . Abdominal aortic aneurysm, without rupture ([place])   . HTN (hypertension)   . Parkinson disease ([place])   . Vascular dementia [facility])    History reviewed. No pertinent surgical history.   A IV Location/Drains/Wounds Patient Lines/Drains/Airways Status   Active Line/Drains/Airways    Name:   Placement date:   Placement time:   Site:   Days:   Peripheral IV 06/19/19 Anterior;Left Forearm   06/19/19    2209    Forearm   1   Peripheral IV 06/19/19 Right Forearm   06/19/19     2239    Forearm   1          Intake/Output Last 24 hours  Intake/Output Summary (Last 24 hours) at 06/20/2019 0101 Last data filed at 06/19/2019 2224 Gross per 24 hour  Intake 350 ml  Output 0 ml  Net 350 ml    Labs/Imaging Results for orders placed or performed during the hospital encounter of 06/19/19 (from the past 48 hour(s))  CBC with Differential     Status: Abnormal   Collection Time: 06/19/19 10:25 PM  Result Value Ref Range   WBC 14.4 (H) 4.0 - 10.5 K/uL   RBC 4.44 4.22 - 5.81 MIL/uL   Hemoglobin 13.6 13.0 - 17.0 g/dL   HCT 43.1 39.0 - 52.0 %   MCV 97.1 80.0 - 100.0 fL   MCH 30.6 26.0 - 34.0 pg   MCHC 31.6 30.0 - 36.0 g/dL   RDW 14.3 11.5 - 15.5 %   Platelets 157 150 - 400 K/uL   nRBC 0.0 0.0 - 0.2 %   Neutrophils Relative % 63 %   Neutro Abs 9.1 (H) 1.7 - 7.7 K/uL   Lymphocytes Relative 34 %   Lymphs Abs 4.9 (H) 0.7 - 4.0 K/uL   Monocytes Relative 2 %   Monocytes Absolute 0.3 0.1 - 1.0 K/uL   Eosinophils Relative 0 %   Eosinophils Absolute 0.1 0.0 - 0.5 K/uL   Basophils Relative 0 %  Basophils Absolute 0.0 0.0 - 0.1 K/uL   Immature Granulocytes 1 %   Abs Immature Granulocytes 0.08 (H) 0.00 - 0.07 K/uL    Comment: Performed at [facility], [place] [address] [place], [place] [address]  Comprehensive metabolic panel     Status: Abnormal   Collection Time: 06/19/19 10:25 PM  Result Value Ref Range   Sodium 130 (L) 135 - 145 mmol/L   Potassium 4.5 3.5 - 5.1 mmol/L   Chloride 98 98 - 111 mmol/L   CO2 20 (L) 22 - 32 mmol/L   Glucose, Bld 150 (H) 70 - 99 mg/dL   BUN 28 (H) 8 - 23 mg/dL   Creatinine, Ser 1.50 (H) 0.61 - 1.24 mg/dL   Calcium 8.6 (L) 8.9 - 10.3 mg/dL   Total Protein 5.8 (L) 6.5 - 8.1 g/dL   Albumin 3.1 (L) 3.5 - 5.0 g/dL   AST 24 15 - 41 U/L   ALT 29 0 - 44 U/L   Alkaline Phosphatase 96 38 - 126 U/L   Total Bilirubin 0.9 0.3 - 1.2 mg/dL   GFR calc non Af Amer 43 (L) >60 mL/min   GFR calc Af Amer 50 (L) >60 mL/min   Anion gap 12 5 - 15     Comment: Performed at [facility], [place] [address], [place], [place] [address]  Troponin I (High Sensitivity)     Status: None   Collection Time: 06/19/19 10:25 PM  Result Value Ref Range   Troponin I (High Sensitivity) 11 <18 ng/L    Comment: (NOTE) Elevated high sensitivity troponin I (hsTnI) values and significant  changes across serial measurements may suggest ACS but many other  chronic and acute conditions are known to elevate hsTnI results.  Refer to the "Links" section for chest pain algorithms and additional  guidance. Performed at [facility], [place] [address], [place], [place] [address]   Lactic acid, plasma     Status: Abnormal   Collection Time: 06/19/19 10:25 PM  Result Value Ref Range   Lactic Acid, Venous 4.1 (HH) 0.5 - 1.9 mmol/L    Comment: CRITICAL RESULT CALLED TO, READ BACK BY AND VERIFIED WITH: Kaevon Cotta,P RN 06/19/2019 2309 JORDANS Performed at [facility], [place] [address], [place], [place] [address]   APTT     Status: None   Collection Time: 06/19/19 10:25 PM  Result Value Ref Range   aPTT 24 24 - 36 seconds    Comment: Performed at [place] [address], [place], [place] [address]  Protime-INR     Status: None   Collection Time: 06/19/19 10:25 PM  Result Value Ref Range   Prothrombin Time 14.5 11.4 - 15.2 seconds   INR 1.1 0.8 - 1.2    Comment: (NOTE) INR goal varies based on device and disease states. Performed at [facility], [place] [address], [place], [place] [address]   Urinalysis, Routine w reflex microscopic     Status: Abnormal   Collection Time: 06/19/19 10:28 PM  Result Value Ref Range   Color, Urine YELLOW YELLOW   APPearance HAZY (A) CLEAR   Specific Gravity, Urine 1.021 1.005 - 1.030   pH 5.0 5.0 - 8.0   Glucose, UA 50 (A) NEGATIVE mg/dL   Hgb urine dipstick MODERATE (A) NEGATIVE   Bilirubin Urine NEGATIVE NEGATIVE   Ketones, ur 5 (A) NEGATIVE mg/dL   Protein, ur NEGATIVE NEGATIVE mg/dL    Nitrite NEGATIVE  NEGATIVE   Leukocytes,Ua TRACE (A) NEGATIVE   RBC / HPF 21-50 0 - 5 RBC/hpf   WBC, UA 0-5 0 - 5 WBC/hpf   Bacteria, UA RARE (A) NONE SEEN   Squamous Epithelial / LPF 0-5 0 - 5   Mucus PRESENT    Hyaline Casts, UA PRESENT     Comment: Performed at [facility], [place] [address] [place], [place] [address]  SARS Coronavirus 2 (CEPHEID- Performed in [place] [facility] lab), Hosp Order     Status: None   Collection Time: 06/19/19 10:28 PM   Specimen: Urine, Catheterized; Nasopharyngeal  Result Value Ref Range   SARS Coronavirus 2 NEGATIVE NEGATIVE    Comment: (NOTE) If result is NEGATIVE SARS-CoV-2 target nucleic acids are NOT DETECTED. The SARS-CoV-2 RNA is generally detectable in upper and lower  respiratory specimens during the acute phase of infection. The lowest  concentration of SARS-CoV-2 viral copies this assay can detect is 250  copies / mL. A negative result does not preclude SARS-CoV-2 infection  and should not be used as the sole basis for treatment or other  patient management decisions.  A negative result may occur with  improper specimen collection / handling, submission of specimen other  than nasopharyngeal swab, presence of viral mutation(s) within the  areas targeted by this assay, and inadequate number of viral copies  (<250 copies / mL). A negative result must be combined with clinical  observations, patient history, and epidemiological information. If result is POSITIVE SARS-CoV-2 target nucleic acids are DETECTED. The SARS-CoV-2 RNA is generally detectable in upper and lower  respiratory specimens dur ing the acute phase of infection.  Positive  results are indicative of active infection with SARS-CoV-2.  Clinical  correlation with patient history and other diagnostic information is  necessary to determine patient infection status.  Positive results do  not rule out bacterial infection or co-infection with other viruses. If result is  PRESUMPTIVE POSTIVE SARS-CoV-2 nucleic acids MAY BE PRESENT.   A presumptive positive result was obtained on the submitted specimen  and confirmed on repeat testing.  While 2019 novel coronavirus  (SARS-CoV-2) nucleic acids may be present in the submitted sample  additional confirmatory testing may be necessary for epidemiological  and / or clinical management purposes  to differentiate between  SARS-CoV-2 and other Sarbecovirus currently known to infect humans.  If clinically indicated additional testing with an alternate test  methodology [phone]) is advised. The SARS-CoV-2 RNA is generally  detectable in upper and lower respiratory sp ecimens during the acute  phase of infection. The expected result is Negative. Fact Sheet for Patients:  StrictlyIdeas.no Fact Sheet for Healthcare Providers: BankingDealers.co.za This test is not yet approved or cleared by the [place] FDA and has been authorized for detection and/or diagnosis of SARS-CoV-2 by FDA under an Emergency Use Authorization (EUA).  This EUA will remain in effect (meaning this test can be used) for the duration of the COVID-19 declaration under Section 564(b)(1) of the Act, 21 U.S.C. section 360bbb-3(b)(1), unless the authorization is terminated or revoked sooner. Performed at [facility], [place] [address], [place], [place] [address]    Ct Head Wo Contrast  Result Date: 06/20/2019 CLINICAL DATA:  Altered LOC EXAM: CT HEAD WITHOUT CONTRAST TECHNIQUE: Contiguous axial images were obtained from the base of the skull through the vertex without intravenous contrast. COMPARISON:  None. FINDINGS: Brain: No acute territorial infarction, hemorrhage or intracranial mass. Moderate atrophy. Mild small vessel ischemic changes  of the white matter. Prominent ventricles secondary to atrophy Vascular: No hyperdense vessels.  Carotid vascular calcification Skull: Normal. Negative for  fracture or focal lesion. Sinuses/Orbits: Chronic appearing deformity of right maxillary sinus. Sinuses are clear Other: None IMPRESSION: 1. No CT evidence for acute intracranial abnormality. 2. Atrophy and mild small vessel ischemic changes of the white matter Electronically Signed   By: Donavan Foil M.D.   On: 06/20/2019 00:40   Ct Angio Chest Pe W And/or Wo Contrast  Result Date: 06/20/2019 CLINICAL DATA:  Shortness of breath EXAM: CT ANGIOGRAPHY CHEST WITH CONTRAST TECHNIQUE: Multidetector CT imaging of the chest was performed using the standard protocol during bolus administration of intravenous contrast. Multiplanar CT image reconstructions and MIPs were obtained to evaluate the vascular anatomy. CONTRAST:  23mL OMNIPAQUE IOHEXOL 350 MG/ML SOLN COMPARISON:  Chest x-ray 06/19/2019 FINDINGS: Cardiovascular: Satisfactory opacification of the pulmonary arteries to the segmental level. Acute saddle embolus on the right, with thrombus visualized in the right upper lobe segmental branch vessels, distal right main pulmonary artery, descending right pulmonary artery and right lower lobe segmental and subsegmental branch vessels. Small nonocclusive emboli within left lower lobe subsegmental branch vessels. RV LV ratio is elevated at 1.3. Moderate aortic atherosclerosis without aneurysm. Coronary vascular calcification. Normal heart size. No pericardial effusion Mediastinum/Nodes: Midline trachea. No thyroid mass. No significant adenopathy. Mild distal esophageal thickening with hiatal hernia Lungs/Pleura: Consolidation and ground-glass density in the bilateral lower lobes. Central lobular foci of ground-glass density and nodularity within the bilateral upper lobes, also suspicious for pneumonia. No pleural effusion. Upper Abdomen: No acute abnormality. Musculoskeletal: Degenerative changes. No acute or suspicious abnormality. Review of the MIP images confirms the above findings. IMPRESSION: 1. Positive for acute  pulmonary emboli involving the distal right main pulmonary artery with extension of thrombus into right inter lobar artery and upper and lower lobe segmental and subsegmental branch vessels. Small subsegmental pulmonary emboli in the left lower lobe. Positive for acute PE with CT evidence of right heart strain (RV/LV Ratio = 1.3) consistent with at least submassive (intermediate risk) PE. The presence of right heart strain has been associated with an increased risk of morbidity and mortality. Please activate Code PE by paging [phone]. 2. Multifocal pneumonia, worst in the bilateral lower lobes. Critical Value/emergent results were called by telephone at the time of interpretation on 06/20/2019 at 12:52 am to Dr. Franchot Heidelberg , who verbally acknowledged these results. Aortic Atherosclerosis (ICD10-I70.0). Electronically Signed   By: Donavan Foil M.D.   On: 06/20/2019 00:52   Dg Chest Portable 1 View  Result Date: 06/19/2019 CLINICAL DATA:  Shortness of breath EXAM: PORTABLE CHEST 1 VIEW COMPARISON:  None. FINDINGS: Mild patchy lingular/left lower lobe opacity. Mild right basilar opacity. No frank interstitial edema.  No pleural effusion or pneumothorax. The heart is normal in size. Degenerative changes of the thoracic spine. IMPRESSION: Mild patchy bilateral lower lung opacities, suspicious for pneumonia. Electronically Signed   By: Julian Hy M.D.   On: 06/19/2019 22:32    Pending Labs Unresulted Labs (From admission, onward)    Start     Ordered   06/19/19 2220  Blood culture (routine x 2)  BLOOD CULTURE X 2,   STAT     06/19/19 2219   06/19/19 2219  Urine culture  ONCE - STAT,   STAT     06/19/19 2219   06/19/19 2219  Lactic acid, plasma  Now then every 2 hours,   STAT  06/19/19 2219          Vitals/Pain Today's Vitals   06/20/19 0000 06/20/19 0015 06/20/19 0030 06/20/19 0045  BP: (!) 128/55 132/70 137/64 (!) 152/69  Pulse: 91 91 94 (!) 101  Resp:      Temp:       TempSrc:      SpO2: 97% 97% 94% 96%  Weight:      Height:        Isolation Precautions Airborne and Contact precautions  Medications Medications  vancomycin (VANCOCIN) 1,750 mg in sodium chloride 0.9 % 500 mL IVPB (1,750 mg Intravenous New Bag/Given 06/19/19 2337)  piperacillin-tazobactam (ZOSYN) IVPB 3.375 g (has no administration in time range)  vancomycin (VANCOCIN) 1,750 mg in sodium chloride 0.9 % 500 mL IVPB (has no administration in time range)  sodium chloride 0.9 % bolus 1,000 mL (0 mLs Intravenous Stopped 06/19/19 2337)  piperacillin-tazobactam (ZOSYN) IVPB 3.375 g (0 g Intravenous Stopped 06/19/19 2322)  sodium chloride 0.9 % bolus 1,600 mL (1,600 mLs Intravenous New Bag/Given 06/19/19 2342)  iohexol (OMNIPAQUE) 350 MG/ML injection 100 mL (100 mLs Intravenous Contrast Given 06/19/19 2357)  iohexol (OMNIPAQUE) 350 MG/ML injection 100 mL (60 mLs Intravenous Contrast Given 06/20/19 0028)    Mobility walks with person assist High fall risk   Focused Assessments Pulmonary Assessment Handoff:  Lung sounds: Bilateral Breath Sounds: Diminished O2 Device: Nasal Cannula O2 Flow Rate (L/min): 4 L/min      R Recommendations: See Admitting Provider Note  Report given to:   Additional Notes:

## 2019-06-20 NOTE — ED Provider Notes (Addendum)
[facility] EMERGENCY DEPARTMENT Provider Note   CSN: [id number] Arrival date & time: 06/19/19  2206    History   Chief Complaint Chief Complaint  Patient presents with  . Fall    HPI Christian Wiley is a 84 y.o. male presenting for weakness.  Level V caveat due to dementia.  History obtained by EMS. Per EMS, they were called to the house when pt fell, and was weak and could not stand,. Weakness occurred prior to the fall. He is responsive, but confused. He is currently being tx for a UTI, unknown abx. Pt was hypoxic in the 80's on arrival, placed on NRB and sats improved.   Additional history obtained from pt's wife. She states he has been tx for a kidney infection for 6 days after blood work showed a uti/kidney infection. He is on a cephalosporin. He has not had fevers at home. He has a h/o of problems with aspiration, today when he was eating he started coughing and was coughing for 2 hrs. They were walking together when he was leaning forward and fell, hitting his L elbow. He did not hit his head or lose consciousness. He was unable to stand up and was very weak, so she called EMS. She denies anyone else at home who is sick. She denies contact with covid-19 positive person.      HPI  Past Medical History:  Diagnosis Date  . Abdominal aortic aneurysm, without rupture ([place])   . HTN (hypertension)   . Parkinson disease ([place])   . Vascular dementia [facility])     Patient Active Problem List   Diagnosis Date Noted  . Sepsis due to pneumonia ([place]) 06/20/2019  . Parkinson disease ([place])   . Vascular dementia ([place])   . Hypothyroidism   . History of pulmonary embolus (PE)   . Depression with anxiety   . Hyperlipidemia   . BPH (benign prostatic hyperplasia)   . AKI (acute kidney injury) ([place])     History reviewed. No pertinent surgical history.      Home Medications    Prior to Admission medications   Not on File    Family History Family History   Problem Relation Age of Onset  . Hyperlipidemia Mother   . Hypertension Mother   . Hyperlipidemia Father   . Hypertension Father   . Heart disease Father     Social History Social History   Tobacco Use  . Smoking status: Former Smoker    Years: 20.00  . Smokeless tobacco: Never Used  Substance Use Topics  . Alcohol use: Not Currently  . Drug use: Not Currently     Allergies   Ambien [zolpidem tartrate]   Review of Systems Review of Systems  Unable to perform ROS: Dementia  Neurological: Positive for weakness.     Physical Exam Updated Vital Signs BP 132/70   Pulse 91   Temp (S) (!) 101.8 F (38.8 C) (Rectal)   Resp (!) 30   Ht 5\' 9"  (1.753 m)   Wt 88 kg   SpO2 97%   BMI 28.65 kg/m   Physical Exam Vitals signs and nursing note reviewed.  Constitutional:      Appearance: He is well-developed. He is ill-appearing.     Comments: Elderly male who appears ill  HENT:     Head: Normocephalic and atraumatic.     Mouth/Throat:     Mouth: Mucous membranes are cyanotic.     Comments: Duskiness surrounding lips Eyes:  Conjunctiva/sclera: Conjunctivae normal.     Pupils: Pupils are equal, round, and reactive to light.  Neck:     Musculoskeletal: Normal range of motion and neck supple.  Cardiovascular:     Rate and Rhythm: Normal rate and regular rhythm.  Pulmonary:     Effort: Tachypnea and accessory muscle usage present.     Breath sounds: Normal breath sounds.     Comments: tachypneic with accessory muscle use. Clear lung sounds Abdominal:     General: There is no distension.     Palpations: Abdomen is soft. There is no mass.     Tenderness: There is no abdominal tenderness. There is no guarding or rebound.  Musculoskeletal: Normal range of motion.  Skin:    General: Skin is warm and dry.     Capillary Refill: Capillary refill takes less than 2 seconds.  Neurological:     Comments: Per wife, pt alert to baseline      ED Treatments / Results   Labs (all labs ordered are listed, but only abnormal results are displayed) Labs Reviewed  CBC WITH DIFFERENTIAL/PLATELET - Abnormal; Notable for the following components:      Result Value   WBC 14.4 (*)    Neutro Abs 9.1 (*)    Lymphs Abs 4.9 (*)    Abs Immature Granulocytes 0.08 (*)    All other components within normal limits  COMPREHENSIVE METABOLIC PANEL - Abnormal; Notable for the following components:   Sodium 130 (*)    CO2 20 (*)    Glucose, Bld 150 (*)    BUN 28 (*)    Creatinine, Ser 1.50 (*)    Calcium 8.6 (*)    Total Protein 5.8 (*)    Albumin 3.1 (*)    GFR calc non Af Amer 43 (*)    GFR calc Af Amer 50 (*)    All other components within normal limits  URINALYSIS, ROUTINE W REFLEX MICROSCOPIC - Abnormal; Notable for the following components:   APPearance HAZY (*)    Glucose, UA 50 (*)    Hgb urine dipstick MODERATE (*)    Ketones, ur 5 (*)    Leukocytes,Ua TRACE (*)    Bacteria, UA RARE (*)    All other components within normal limits  LACTIC ACID, PLASMA - Abnormal; Notable for the following components:   Lactic Acid, Venous 4.1 (*)    All other components within normal limits  SARS CORONAVIRUS 2 (HOSPITAL ORDER, [place] [facility])  URINE CULTURE  CULTURE, BLOOD (ROUTINE X 2)  CULTURE, BLOOD (ROUTINE X 2)  APTT  PROTIME-INR  LACTIC ACID, PLASMA  TROPONIN I (HIGH SENSITIVITY)  TROPONIN I (HIGH SENSITIVITY)    EKG None  Radiology Dg Chest Portable 1 View  Result Date: 06/19/2019 CLINICAL DATA:  Shortness of breath EXAM: PORTABLE CHEST 1 VIEW COMPARISON:  None. FINDINGS: Mild patchy lingular/left lower lobe opacity. Mild right basilar opacity. No frank interstitial edema.  No pleural effusion or pneumothorax. The heart is normal in size. Degenerative changes of the thoracic spine. IMPRESSION: Mild patchy bilateral lower lung opacities, suspicious for pneumonia. Electronically Signed   By: Julian Hy M.D.   On: 06/19/2019  22:32    Procedures .Critical Care Performed by: Franchot Heidelberg, PA-C Authorized by: Franchot Heidelberg, PA-C   Critical care provider statement:    Critical care time (minutes):  45   Critical care time was exclusive of:  Separately billable procedures and treating other patients and teaching time  Critical care was necessary to treat or prevent imminent or life-threatening deterioration of the following conditions:  Respiratory failure and sepsis   Critical care was time spent personally by me on the following activities:  Blood draw for specimens, development of treatment plan with patient or surrogate, discussions with consultants, examination of patient, evaluation of patient's response to treatment, obtaining history from patient or surrogate, ordering and performing treatments and interventions, ordering and review of laboratory studies, ordering and review of radiographic studies, pulse oximetry, re-evaluation of patient's condition and review of old charts   I assumed direction of critical care for this patient from another provider in my specialty: no   Comments:     Pt in respiratory distress on arrival with hypoxia, tachypnea and accessory muscle use. Found to be septic. Fluid and abx started. Pt admitted.    (including critical care time)  Medications Ordered in ED Medications  vancomycin (VANCOCIN) 1,750 mg in sodium chloride 0.9 % 500 mL IVPB (1,750 mg Intravenous New Bag/Given 06/19/19 2337)  piperacillin-tazobactam (ZOSYN) IVPB 3.375 g (has no administration in time range)  vancomycin (VANCOCIN) 1,750 mg in sodium chloride 0.9 % 500 mL IVPB (has no administration in time range)  sodium chloride 0.9 % bolus 1,000 mL (0 mLs Intravenous Stopped 06/19/19 2337)  piperacillin-tazobactam (ZOSYN) IVPB 3.375 g (0 g Intravenous Stopped 06/19/19 2322)  sodium chloride 0.9 % bolus 1,600 mL (1,600 mLs Intravenous New Bag/Given 06/19/19 2342)  iohexol (OMNIPAQUE) 350 MG/ML injection 100  mL (100 mLs Intravenous Contrast Given 06/19/19 2357)  iohexol (OMNIPAQUE) 350 MG/ML injection 100 mL (60 mLs Intravenous Contrast Given 06/20/19 0028)     Initial Impression / Assessment and Plan / ED Course  I have reviewed the triage vital signs and the nursing notes.  Pertinent labs & imaging results that were available during my care of the patient were reviewed by me and considered in my medical decision making (see chart for details).        Patient presenting for evaluation of generalized weakness.  On arrival, patient is ill-appearing, he is tachypneic with accessory muscle use.  He was hypoxic on room air, has mild cyanosis around his lips.  Concern for respiratory versus cardiac cause of his symptoms.  Consider infection versus PE versus ACS.  There was concern about his GCS after the fall, however after talking to the wife it appears that this is his baseline.  I have very low suspicion that the fall was the cause of his change in mental status.  Much higher concern that this is due to hypoxia.  Will obtain labs, chest x-ray, COVID, CTA to rule PE, CT head.  Case discussed with attending, Dr. Maryan Rued evaluated the patient.  Chest x-ray viewed interpreted by me, shows concern for bilateral lower lobe pneumonia.  Patient with a history of aspiration.  Labs concerning for sepsis, elevated white count at 14 and lactic of 4.1.  Patient is febrile at 101.8.  Blood pressure soft in the low 100s.  Code sepsis called, antibiotics and fluids started per sepsis protocol. Patient with an AKI with creatinine of 1.5.  Urine shows trace leuks and rare bacteria, he is currently being treated for a UTI.  COVID negative.  Will call for admission.  Discussed with Dr. Posey Pronto [facility], patient to be admitted.  Discussed findings and plan with patient's wife, she is agreeable to admission.  She is requesting an update tomorrow (contact info in notes)  Final Clinical Impressions(s) /  ED Diagnoses   Final diagnoses:  Sepsis with acute renal failure without septic shock, due to unspecified organism, unspecified acute renal failure type ([place])  Pneumonia of both lower lobes due to infectious organism [facility])  Hypoxia    ED Discharge Orders    None       Franchot Heidelberg, PA-C 06/20/19 0036   Addendum: radiologist called stating pt has saddle PE with R heart strain. Additional, multifocal PNA. Will consult with critical care. Per chart review, pt is supposed to be on xarelto for previous PEs.  Discussed with Dr. James Ivanoff from [facility], who recommends heparin, echo in AM, and CC team will consult.  Discussed with hospitalist, who states wife said patient has been off his blood thinner for the past year.     Franchot Heidelberg, PA-C 06/20/19 1820    Blanchie Dessert, MD 06/20/19 1529

## 2019-06-20 NOTE — Progress Notes (Signed)
[place] for heparin Indication: pulmonary embolus  Allergies  Allergen Reactions  . Ambien [Zolpidem Tartrate] Other (See Comments)    Caused anxiety and confusion    Patient Measurements: Height: 6' (182.9 cm) Weight: 188 lb 7.9 oz (85.5 kg) IBW/kg (Calculated) : 77.6  Vital Signs: Temp: 98.3 F (36.8 C) (07/12 1557) Temp Source: Oral (07/12 1557) BP: 158/75 (07/12 1557) Pulse Rate: 68 (07/12 1557)  Labs: Recent Labs    06/19/19 2225 06/20/19 0040 06/20/19 0353 06/20/19 1110 06/20/19 1932  HGB 13.6  --  12.0*  --   --   HCT 43.1  --  36.4*  --   --   PLT 157  --  121*  --   --   APTT 24  --  161*  --   --   LABPROT 14.5  --  17.4*  --   --   INR 1.1  --  1.4*  --   --   HEPARINUNFRC  --   --   --  0.91* 0.57  CREATININE 1.50*  --  1.42*  --   --   TROPONINIHS 11 19*  --   --   --     Estimated Creatinine Clearance: 44 mL/min (A) (by C-G formula based on SCr of 1.42 mg/dL (H)).   Medical History: Past Medical History:  Diagnosis Date  . Abdominal aortic aneurysm, without rupture ([place])   . HTN (hypertension)   . Parkinson disease ([place])   . Vascular dementia [facility])     Assessment: 84yo male presents to ED for weakness, found to be hypoxic >> CT reveals PNA and PE w/ evidence of RHS. Pharmacy consulted for heparin management.  -heparin level is at goal after decrease to 1200 units/hr  Goal of Therapy:  Heparin level 0.3-0.7 units/ml Monitor platelets by anticoagulation protocol: Yes   Plan:  No heparin changes needed Monitor daily heparin level and CBC  Thank you for allowing pharmacy to be a part of this patient's care.  Leron Croak, PharmD PGY2 Oncology/Hematology Pharmacy Resident 06/20/2019 8:32 PM

## 2019-06-20 NOTE — H&P (Signed)
History and Physical    Christian Wiley NTI:[id number], [id number] DOB: 07-Feb-1935 DOA: 06/19/2019  PCP: [name], MD  Patient coming from: Home  I have personally briefly reviewed patient's old medical records in [place]  Chief Complaint: Altered mental status and fall  HPI: Note: Patient has 2 charts MRN [id number] MRN [id number]  Christian Wiley is a 84 y.o. male with medical history significant for vascular dementia, Parkinson's disease, hypothyroidism, GERD, hyperlipidemia, BPH, RBBB, history of PE not on anticoagulation, AAA status post endovascular repair, depression who presents to the ED for evaluation of altered mental status after a fall.  Patient is unable to provide any history therefore entirety of history is obtained from EDP, chart review, and wife by phone.  Wife states that patient at baseline is nonverbal and only occasionally alert.  He needs help with all of his ADLs but can walk with the use of a walker.  She states that he seems to be choking every time he eats.  She says that he has been treated for UTI with cefdinir completing day 6 of 7 so far.  She says today while walking with his walker he began to fall forwards.  Luckily his wife was by and was unable to catch him and slowly sliding down to the ground.  She says he did have a small abrasion to his elbow but did not hit his head.  She called EMS and per EDP patient was noted to be hypoxic requiring nonrebreather and was brought to the ED for further evaluation.  ED Course:  Initial vitals showed BP 133/55, pulse 88, RR 30, temp 101.8 Fahrenheit rectally, SPO2 97% on 4 L supplemental O2 via [place].  Labs are notable for WBC 14.4, hemoglobin 13.6, platelets 157,000, sodium 130, potassium 4.5, bicarb 20, BUN 28, creatinine 1.5, serum glucose 150, high-sensitivity troponin IL-11, lactic acid 4.1, SARS-CoV-2 test negative.  Urinalysis showed negative nitrites, trace leukocytes, 21-50 RBC/hpf, 0-5 WBC  /hpf, rare bacteria on microscopy.  Blood cultures were drawn and pending.  Portable chest x-ray showed mild patchy bilateral lower lung opacities.  CT head without contrast was negative for acute intracranial abnormality.  Atrophy and mild small vessel ischemic changes of the white matter was noted.  CTA chest PE study showed an acute PE involving the distal right main pulmonary artery with extension into the right lobar artery and upper and lower lobe segmental and subsegmental branch vessels.  A small left lower lobe subsegmental PE was also seen.  CT findings were positive for evidence of right heart strain consistent with at least submassive PE.  Changes suggestive of multifocal pneumonia worse in the bilateral lower lobes was also seen.  Patient was given 2.6 L normal saline and started on IV vancomycin and Zosyn.  PCCM were consulted who recommended hospitalist admission.  The hospitalist service was consulted to admit for further evaluation and management.   Review of Systems:  Unable to obtain full review of systems due to patient's dementia.   Past Medical History:  Diagnosis Date   Abdominal aortic aneurysm, without rupture (HCC)    HTN (hypertension)    Parkinson disease ([place])    Vascular dementia ([place])     History reviewed. No pertinent surgical history.  Social History:  reports that he has quit smoking. He quit after 20.00 years of use. He has never used smokeless tobacco. He reports previous alcohol use. He reports previous drug use.  Allergies  Allergen Reactions   Ambien [Zolpidem  Tartrate]     Family History  Problem Relation Age of Onset   Hyperlipidemia Mother    Hypertension Mother    Hyperlipidemia Father    Hypertension Father    Heart disease Father      Prior to Admission medications   Not on File    Physical Exam: Vitals:   06/20/19 0000 06/20/19 0015 06/20/19 0030 06/20/19 0045  BP: (!) 128/55 132/70 137/64 (!) 152/69  Pulse: 91  91 94 (!) 101  Resp:      Temp:      TempSrc:      SpO2: 97% 97% 94% 96%  Weight:      Height:       Exam limited due to patient cooperation. Constitutional: Chronically ill-appearing elderly man resting supine in bed, very somnolent opens eyes to verbal stimuli, not following simple commands Eyes: PERRL, lids and conjunctivae normal ENMT: Mucous membranes are dry. Posterior pharynx clear of any exudate or lesions. Neck: normal, supple, no masses. Respiratory: Bibasilar crackles.  Normal respiratory effort. No accessory muscle use.  O2 saturation 98% on 3 L supplemental O2 via [place]. Cardiovascular: Regular rate and rhythm, no murmurs / rubs / gallops. No extremity edema. 2+ pedal pulses. Abdomen: no tenderness, no masses palpated. No hepatosplenomegaly. Bowel sounds positive.  Musculoskeletal: no clubbing / cyanosis. No joint deformity upper and lower extremities.  Cogwheel rigidity of both upper extremities. Skin: no rashes, lesions, ulcers. No induration Neurologic: Limited as patient very somnolent, not following commands. Psychiatric: Somnolent, opens eyes but not really interactive.  Will only state his wife's name but otherwise not answering other orientation questions.   Labs on Admission: I have personally reviewed following labs and imaging studies  CBC: Recent Labs  Lab 06/19/19 2225  WBC 14.4*  NEUTROABS 9.1*  HGB 13.6  HCT 43.1  MCV 97.1  PLT 299   Basic Metabolic Panel: Recent Labs  Lab 06/19/19 2225  NA 130*  K 4.5  CL 98  CO2 20*  GLUCOSE 150*  BUN 28*  CREATININE 1.50*  CALCIUM 8.6*   GFR: Estimated Creatinine Clearance: 41.7 mL/min (A) (by C-G formula based on SCr of 1.5 mg/dL (H)). Liver Function Tests: Recent Labs  Lab 06/19/19 2225  AST 24  ALT 29  ALKPHOS 96  BILITOT 0.9  PROT 5.8*  ALBUMIN 3.1*   No results for input(s): LIPASE, AMYLASE in the last 168 hours. No results for input(s): AMMONIA in the last 168 hours. Coagulation  Profile: Recent Labs  Lab 06/19/19 2225  INR 1.1   Cardiac Enzymes: No results for input(s): CKTOTAL, CKMB, CKMBINDEX, TROPONINI in the last 168 hours. BNP (last 3 results) No results for input(s): PROBNP in the last 8760 hours. HbA1C: No results for input(s): HGBA1C in the last 72 hours. CBG: No results for input(s): GLUCAP in the last 168 hours. Lipid Profile: No results for input(s): CHOL, HDL, LDLCALC, TRIG, CHOLHDL, LDLDIRECT in the last 72 hours. Thyroid Function Tests: No results for input(s): TSH, T4TOTAL, FREET4, T3FREE, THYROIDAB in the last 72 hours. Anemia Panel: No results for input(s): VITAMINB12, FOLATE, FERRITIN, TIBC, IRON, RETICCTPCT in the last 72 hours. Urine analysis:    Component Value Date/Time   COLORURINE YELLOW 06/19/2019 2228   APPEARANCEUR HAZY (A) 06/19/2019 2228   LABSPEC 1.021 06/19/2019 2228   PHURINE 5.0 06/19/2019 2228   GLUCOSEU 50 (A) 06/19/2019 2228   HGBUR MODERATE (A) 06/19/2019 2228   BILIRUBINUR NEGATIVE 06/19/2019 2228   KETONESUR 5 (A) 06/19/2019 2228  PROTEINUR NEGATIVE 06/19/2019 2228   NITRITE NEGATIVE 06/19/2019 2228   LEUKOCYTESUR TRACE (A) 06/19/2019 2228    Radiological Exams on Admission: Ct Head Wo Contrast  Result Date: 06/20/2019 CLINICAL DATA:  Altered LOC EXAM: CT HEAD WITHOUT CONTRAST TECHNIQUE: Contiguous axial images were obtained from the base of the skull through the vertex without intravenous contrast. COMPARISON:  None. FINDINGS: Brain: No acute territorial infarction, hemorrhage or intracranial mass. Moderate atrophy. Mild small vessel ischemic changes of the white matter. Prominent ventricles secondary to atrophy Vascular: No hyperdense vessels.  Carotid vascular calcification Skull: Normal. Negative for fracture or focal lesion. Sinuses/Orbits: Chronic appearing deformity of right maxillary sinus. Sinuses are clear Other: None IMPRESSION: 1. No CT evidence for acute intracranial abnormality. 2. Atrophy and mild  small vessel ischemic changes of the white matter Electronically Signed   By: Donavan Foil M.D.   On: 06/20/2019 00:40   Ct Angio Chest Pe W And/or Wo Contrast  Result Date: 06/20/2019 CLINICAL DATA:  Shortness of breath EXAM: CT ANGIOGRAPHY CHEST WITH CONTRAST TECHNIQUE: Multidetector CT imaging of the chest was performed using the standard protocol during bolus administration of intravenous contrast. Multiplanar CT image reconstructions and MIPs were obtained to evaluate the vascular anatomy. CONTRAST:  73mL OMNIPAQUE IOHEXOL 350 MG/ML SOLN COMPARISON:  Chest x-ray 06/19/2019 FINDINGS: Cardiovascular: Satisfactory opacification of the pulmonary arteries to the segmental level. Acute saddle embolus on the right, with thrombus visualized in the right upper lobe segmental branch vessels, distal right main pulmonary artery, descending right pulmonary artery and right lower lobe segmental and subsegmental branch vessels. Small nonocclusive emboli within left lower lobe subsegmental branch vessels. RV LV ratio is elevated at 1.3. Moderate aortic atherosclerosis without aneurysm. Coronary vascular calcification. Normal heart size. No pericardial effusion Mediastinum/Nodes: Midline trachea. No thyroid mass. No significant adenopathy. Mild distal esophageal thickening with hiatal hernia Lungs/Pleura: Consolidation and ground-glass density in the bilateral lower lobes. Central lobular foci of ground-glass density and nodularity within the bilateral upper lobes, also suspicious for pneumonia. No pleural effusion. Upper Abdomen: No acute abnormality. Musculoskeletal: Degenerative changes. No acute or suspicious abnormality. Review of the MIP images confirms the above findings. IMPRESSION: 1. Positive for acute pulmonary emboli involving the distal right main pulmonary artery with extension of thrombus into right inter lobar artery and upper and lower lobe segmental and subsegmental branch vessels. Small subsegmental  pulmonary emboli in the left lower lobe. Positive for acute PE with CT evidence of right heart strain (RV/LV Ratio = 1.3) consistent with at least submassive (intermediate risk) PE. The presence of right heart strain has been associated with an increased risk of morbidity and mortality. Please activate Code PE by paging [phone]. 2. Multifocal pneumonia, worst in the bilateral lower lobes. Critical Value/emergent results were called by telephone at the time of interpretation on 06/20/2019 at 12:52 am to Dr. Franchot Heidelberg , who verbally acknowledged these results. Aortic Atherosclerosis (ICD10-I70.0). Electronically Signed   By: Donavan Foil M.D.   On: 06/20/2019 00:52   Dg Chest Portable 1 View  Result Date: 06/19/2019 CLINICAL DATA:  Shortness of breath EXAM: PORTABLE CHEST 1 VIEW COMPARISON:  None. FINDINGS: Mild patchy lingular/left lower lobe opacity. Mild right basilar opacity. No frank interstitial edema.  No pleural effusion or pneumothorax. The heart is normal in size. Degenerative changes of the thoracic spine. IMPRESSION: Mild patchy bilateral lower lung opacities, suspicious for pneumonia. Electronically Signed   By: Julian Hy M.D.   On: 06/19/2019 22:32    EKG:  Ordered and pending.  Assessment/Plan Principal Problem:   Sepsis due to pneumonia [facility]) Active Problems:   Parkinson disease ([place])   Vascular dementia ([place])   Hypothyroidism   History of pulmonary embolus (PE)   Depression with anxiety   Hyperlipidemia   BPH (benign prostatic hyperplasia)   AKI (acute kidney injury) (HCC)  Christian Wiley is a 84 y.o. male with medical history significant for vascular dementia, Parkinson's disease, hypothyroidism, GERD, hyperlipidemia, BPH, RBBB, history of PE not on anticoagulation, depression with anxiety who is admitted with sepsis due to pneumonia.  Note: Patient has 2 charts MRN [id number] MRN [id number]  Acute pulmonary embolism of the right main pulmonary  artery: With CT evidence of right heart strain.  Patient has a history of PE and per wife was on Xarelto until  1 year ago which was discontinued due to a type II endoleak found on angiography seemingly resolution of the leak after holding anticoagulation. -Start IV heparin -Echocardiogram in a.m. -Continue supplemental oxygen -PCCM to follow  Bilateral lower lobe pneumonia: Patient started on broad-spectrum antibiotics in the ED.  Blood cultures ordered and pending.  Wife reports behavior suggestive of aspiration. -Will continue broad antibiotics for now, narrow as able -Follow-up blood cultures -Check procalcitonin -Keep n.p.o. for now  Acute kidney injury: Likely prerenal, received 2.6 L normal saline in the ED.  Hold further fluids for now and recheck labs in a.m.  Hyponatremia: Likely from hypovolemia, will recheck in a.m. after receiving 2.6 L normal saline in the ED.  Hyperlipidemia: Holding home atorvastatin 40 mg daily while n.p.o.  Hypothyroidism: Convert home Synthroid 25 mcg to IV 12.5 mcg while n.p.o.  Depression: Holding home Lexapro 10 mg daily Failed.  Vascular Parkinsonism and dementia with behavioral disturbance Failure to thrive/progressive functional decline Goals of care: Patient with advanced vascular parkinsonism and dementia, now essentially nonverbal and unable to perform ADLs per wife.  Is able to ambulate some with the use of a walker otherwise quality of life is poor.  I do long discussion with his wife by phone who states that he has previously told his wife and daughter that in the event of cardiac arrest or significant respiratory distress he would not want to be resuscitated.  Wife also agrees and states that she wants [place] to treat the current conditions with anticoagulation and antibiotics as needed however patient is DNR/DNI.  She is agreeable to palliative care/hospice consultation if not having significant improvement over the next 24  hours. -Holding home Nuplazid 34 mg daily -CODE STATUS is DNR/DNI   DVT prophylaxis: Heparin gtt Code Status: DNR/DNI, confirmed with patient's wife Family Communication: Discussed at length with patient's wife Mikel Cella by phone.  Home phone is [phone], cell phone is [phone]. Disposition Plan: Pending clinical progress Consults called: PCCM Admission status: Admit - It is my clinical opinion that admission to INPATIENT is reasonable and necessary because of the expectation that this patient will require hospital care that crosses at least 2 midnights to treat this condition based on the medical complexity of the problems presented.  Given the aforementioned information, the predictability of an adverse outcome is felt to be significant.   Zada Finders MD [facility]  If 7PM-7AM, please contact night-coverage [email or web address]  06/20/2019, 12:55 AM

## 2019-06-20 NOTE — Consult Note (Addendum)
NAME:  Christian Wiley, MRN:  [id number], DOB:  12/08/1935, LOS: 0 ADMISSION DATE:  06/19/2019, CONSULTATION DATE:  06/20/2019 REFERRING MD:  Zada Finders CHIEF COMPLAINT:  SOB/PE  Brief History   84yo M PMH vascular dementia-minimally verbally communicative, alert to person only at baseline, Parkinson's disease, hypothyroidism, GERD, hyperlipidemia, BPH, RBBB, history of PE not on anticoagulation, AAA status post endovascular repair, depression admitted for acute PE involving the distal right main pulmonary artery with extension into the right lobar artery and upper and lower lobe segmental and subsegmental branch vessels.  A small left lower lobe subsegmental PE was also seen.  CT findings were positive for evidence of right heart strain consistent with at least submassive PE.   History of present illness   [name]. Chaidez is an 84 year old male PMH vascular dementia- minimally verbally communicative, alert to person only at baseline, Parkinson's disease, hypothyroidism, GERD, hyperlipidemia, BPH, RBBB, history of PE not on anticoagulation, AAA status post endovascular repair, depression presented to the emergency department via EMS complaining of weakness and fall.  Per EMR after the patient was weak and fell he remained weak and unable to stand. He was responsive but confused which is his baseline.  He is currently being treated for urinary tract infection with Cefdinir on day 6 of 7. 80% SPO2 on room air and placed on nonrebreather.  His wife denied fever at home. He has a history of aspiration and she was concerned that he aspirated today after he was eating as he coughed and continue to do so for 2 hours.  The wife stated as they were walking together he was leaning forward and fell, hitting his left elbow.  He did not hit his head or lose consciousness.  He was unable to stand and weak therefore she summoned EMS.  There are no sick contacts.  In the ER he was placed on 4 L supplemental O2 via  nasal cannula with SPO2 of 97%, BP 133/55, pulse 88, respirations 30, temperature 101.8 F rectally.  WBC 14.4.  Hemoglobin 13.6.  Platelets 157.  Creatinine 1.5.  High-sensitivity troponin XI.  Lactic acid 4.1.  SARS-CoV 2 negative.   CT of the head negative for acute intracranial abnormality.  Atrophy and mild small vessel ischemic changes of white matter was noted.  CTA of the chest showed acute PE involving the distal right main pulmonary artery with extension into the right lobar artery and upper and lower lobe segmental and subsegmental branch vessels.  A small left lower lobe subsegmental PE was also seen.  CT findings were positive for evidence of right heart strain consistent with at least submassive PE.  Changes suggestive of multifocal pneumonia worse in the bilateral lower lobes was also seen.  Patient received 2.6 L of normal saline, broad-spectrum antibiotics initiated.  He was admitted to hospitalist service.  Therapeutic heparin infusion initiated.  Per wife was on Xarelto until  1 year ago which was discontinued due to a type II endoleak found on angiography seemingly resolution of the leak after holding anticoagulation.  PCCM consulted for management assistance.  Past Medical History  Vascular dementia Parkinson's disease Hypothyroidism GERD Hyperlipidemia BPH  right bundle branch block  history of PE not on anticoagulation AAA status post endovascular repair   depression  Significant Hospital Events   7/12 admitted  Consults:  PCCM  Procedures:  NA  Significant Diagnostic Tests:  7/12: CT of the head negative for acute intracranial abnormality.  Atrophy and mild small vessel  ischemic changes of white matter was noted.  7/12:CTA of the chest showed acute PE involving the distal right main pulmonary artery with extension into the right lobar artery and upper and lower lobe segmental and subsegmental branch vessels.  A small left lower lobe subsegmental PE was also  seen.  CT findings were positive for evidence of right heart strain consistent with at least submassive PE.  Changes suggestive of multifocal pneumonia worse in the bilateral lower lobes was also seen.  Micro Data:  SARS Coronavirus 2 7/12: Urine>> 7/12: Blood>>  Antimicrobials:  Pip-tazo 7/11>> Vancomycin 7/11>> Cefdinir, today is day 6 of 7  Interim history/subjective:  Patient seen and evaluated in 4E23, currently being moved from gurney to hospital bed.  He is alert to person only.  Denies pain.  States he had shortness of breath prior to coming to the hospital, none now.  Objective   Blood pressure (!) 142/73, pulse 95, temperature (P) 97.7 F (36.5 C), temperature source (P) Oral, resp. rate (!) 30, height 5\' 9"  (1.753 m), weight 88 kg, SpO2 100 %.        Intake/Output Summary (Last 24 hours) at 06/20/2019 0311 Last data filed at 06/20/2019 0256 Gross per 24 hour  Intake 2950 ml  Output 0 ml  Net 2950 ml   Filed Weights   06/19/19 2223  Weight: 88 kg    Examination: General: Elderly, well-developed, well-nourished male lying supine, no apparent distress HENT/Neck: Normocephalic, pupils equal round reactive to light.  Moist mucous membranes.  No JVD.  No thyromegaly. Lungs: Bilateral breath sounds present, diminished bases, full nonlabored Cardiovascular: Regular rate and rhythm.  S1-S2.  No murmur rub or gallop. Abdomen: Positive bowel sounds x4.  Soft, nontender, nondistended.  No masses, pulsations, guarding, rigidity Extremities: Moves all extremities spontaneously, no edema.  +2 distal pulses Neuro: Alert to person only.  He does not know where he is, what the day is, time of year or what year it is.  Difficulty finding words.  Follows commands with upper extremities.  Does not follow commands with lower extremities or resist passive movement of lower extremities. GU: Condom cath in place  Resolved Hospital Problem list   NA  Assessment & Plan:  Submassive PE  with resultant hypoxia and RV strain The patient is already on therapeutic heparin. Lactic acid normalized. Per hospitalist H&P discussion with the patient's wife she is agreeable to treat current condition with anticoagulation and antibiotics as needed but request DNR/DNI.  She is agreeable to palliative care/hospice if no significant improvement over the next 24 hours. Plan Continue heparin infusion Echo pending Supplemental O2 for SPO2 greater than or equal to 92% Lower EXT duplex  Agree with palliative care/hospice pending response  Thank you for the opportunity to participate in this patient's care.   Best practice:  Diet: NPO  Pain/Anxiety/Delirium protocol (if indicated): prn VAP protocol (if indicated): not indicated DVT prophylaxis: therapeutic heparin GI prophylaxis: per primary Glucose control: per primary  Mobility: bed rest Code Status: DNR/DNI Family Communication: updated by primary team Disposition: 4E  Labs   CBC: Recent Labs  Lab 06/19/19 2225  WBC 14.4*  NEUTROABS 9.1*  HGB 13.6  HCT 43.1  MCV 97.1  PLT 166    Basic Metabolic Panel: Recent Labs  Lab 06/19/19 2225  NA 130*  K 4.5  CL 98  CO2 20*  GLUCOSE 150*  BUN 28*  CREATININE 1.50*  CALCIUM 8.6*   GFR: Estimated Creatinine Clearance: 41.7 mL/min (A) (  by C-G formula based on SCr of 1.5 mg/dL (H)). Recent Labs  Lab 06/19/19 2225 06/20/19 0040  WBC 14.4*  --   LATICACIDVEN 4.1* 1.6    Liver Function Tests: Recent Labs  Lab 06/19/19 2225  AST 24  ALT 29  ALKPHOS 96  BILITOT 0.9  PROT 5.8*  ALBUMIN 3.1*   No results for input(s): LIPASE, AMYLASE in the last 168 hours. No results for input(s): AMMONIA in the last 168 hours.  ABG No results found for: PHART, PCO2ART, PO2ART, HCO3, TCO2, ACIDBASEDEF, O2SAT   Coagulation Profile: Recent Labs  Lab 06/19/19 2225  INR 1.1    Cardiac Enzymes: No results for input(s): CKTOTAL, CKMB, CKMBINDEX, TROPONINI in the last 168 hours.   HbA1C: No results found for: HGBA1C  CBG: No results for input(s): GLUCAP in the last 168 hours.  Review of Systems:   Complete and negative except as noted in HPI  Past Medical History   Abdominal aortic aneurysm, without rupture ([place]), HTN (hypertension), Parkinson disease ([place]), and Vascular dementia ([place]).   Surgical History   Endovascular repair AAA  Social History   reports that he has quit smoking. He quit after 20.00 years of use. He has never used smokeless tobacco. He reports previous alcohol use. He reports previous drug use.   Family History   His family history includes Heart disease in his father; Hyperlipidemia in his father and mother; Hypertension in his father and mother.   Allergies Allergies  Allergen Reactions  . Ambien [Zolpidem Tartrate]      Home Medications  Prior to Admission medications   Not on File     Critical care time: I spent 45 minutes in direct patient care including reviewing data,  discussing with other providers, assessment, planning and stabilization and documentation. Time is exclusive to this patient and does not include procedures.      Francine Graven, MSN, AGACNP  Pager [phone] or if no answer [phone] [facility]monary [facility]

## 2019-06-20 NOTE — Progress Notes (Signed)
Attempted report x 2 

## 2019-06-20 NOTE — Plan of Care (Signed)
  Problem: Health Behavior/Discharge Planning: Goal: Ability to manage health-related needs will improve Outcome: Not Progressing   

## 2019-06-20 NOTE — Consult Note (Signed)
Consultation Note Date: 06/20/2019   Patient Name: Christian Wiley  DOB: [birth year]  MRN: [id number]  Age / Sex: 84 y.o., male  PCP: [name], MD Referring Physician: [place] Eric J, DO  Reason for Consultation: Establishing goals of care  HPI/Patient Profile: 84 y.o. male  with past medical history of vascular dementia, Parkinson's disease, hypothyroidism, GERD, HLD, BPH, h/o PE 2011 and 2014 (no anticoagulation s/t type II endoleak), AAA s/p endovascular repair, depression admitted on 06/19/2019 from home with AMS and fall and found to have submassive saddle PE with right heart strain as well as bilateral multifocal pneumonia with concern from family about aspiration at home.   Clinical Assessment and Goals of Care: I met today at [name]'s bedside. He is awake and alert. He is oriented to person but not to place or situation. He could speak and converse with me appropriately but is confused.   His wife arrived to bedside and we were able to have a good discussion. She tells me that [name] was doing well and fairly independent with a cane prior to mid June. She says that she took him to [facility] ED with confusion and weakness and concern for UTI. He was d/c home but they have struggled over the past month with mostly bedbound and significant decrease in functional status.   We discussed this decline and Ms. Delashmit's caregiver struggles. She has been trying to care for him at home but this is becoming very difficult for her. She is very much interested in more help at home or even short term rehab. We agreed that we will continue to discuss options and resources based on his progress.   We discussed GOC and Mrs. Bergquist confirms DNR and NO feeding tube. She is tearful thinking that he could be approaching EOL. She tells me that she is very aware of Parkinson's trajectory and admits his QOL has been  declining but she had not considered that they could be approaching this time. She is tearful but tells me that she believes if his time to die comes that she believes he is ready and that they have had a wonderful life together. She found great relief when I reassured her that whatever the future holds for her husband that she will not have to go this alone and this is the purpose of palliative care for support and guidance.   I encouraged her to focus on self care while her husband is hospitalized. She desires treatment of PE (beginning anticoagulants) and pneumonia. I will continue to follow and support Mr. and Mrs. Feighner.   Primary Decision Maker HCPOA wife [name] Kitch    SUMMARY OF RECOMMENDATIONS   - DNR, no feeding tubes - Continue current treatment - Will continue support and recommendations for care dependent on outcomes - Wife explains that a priority is that he does not suffer  Code Status/Advance Care Planning:  DNR   Symptom Management:   Per primary. Treat any pain or discomfort given main goal for minimizing suffering  while continuing supportive care.   SLP following for extensive coughing with intake. He did well on MBSS but continues with severe coughing while eating solids in particular. Will change diet to dysphagia 3 to see if this helps - discussed with wife and she agrees.   Palliative Prophylaxis:   Aspiration, Bowel Regimen, Delirium Protocol and Frequent Pain Assessment  Psycho-social/Spiritual:   Desire for further Chaplaincy support:yes  Additional Recommendations: Caregiving  Support/Resources, Education on Hospice and Grief/Bereavement Support  Prognosis:   TBD. Overall long term prognosis poor with Parkinson's disease and vascular dementia.   Discharge Planning: To Be Determined      Primary Diagnoses: Present on Admission: . Sepsis due to pneumonia (HCC)   I have reviewed the medical record, interviewed the patient and family, and  examined the patient. The following aspects are pertinent.  Past Medical History:  Diagnosis Date  . Abdominal aortic aneurysm, without rupture (HCC)   . HTN (hypertension)   . Parkinson disease (HCC)   . Vascular dementia (HCC)    Social History   Socioeconomic History  . Marital status: Married    Spouse name: Not on file  . Number of children: Not on file  . Years of education: Not on file  . Highest education level: Not on file  Occupational History  . Not on file  Social Needs  . Financial resource strain: Not on file  . Food insecurity    Worry: Not on file    Inability: Not on file  . Transportation needs    Medical: Not on file    Non-medical: Not on file  Tobacco Use  . Smoking status: Former Smoker    Years: 20.00  . Smokeless tobacco: Never Used  Substance and Sexual Activity  . Alcohol use: Not Currently  . Drug use: Not Currently  . Sexual activity: Not on file  Lifestyle  . Physical activity    Days per week: Not on file    Minutes per session: Not on file  . Stress: Not on file  Relationships  . Social connections    Talks on phone: Not on file    Gets together: Not on file    Attends religious service: Not on file    Active member of club or organization: Not on file    Attends meetings of clubs or organizations: Not on file    Relationship status: Not on file  Other Topics Concern  . Not on file  Social History Narrative  . Not on file   Family History  Problem Relation Age of Onset  . Hyperlipidemia Mother   . Hypertension Mother   . Hyperlipidemia Father   . Hypertension Father   . Heart disease Father    Scheduled Meds: . levothyroxine  12.5 mcg Intravenous Daily  . sodium chloride flush  3 mL Intravenous Q12H   Continuous Infusions: . heparin 1,400 Units/hr (06/20/19 0338)  . piperacillin-tazobactam (ZOSYN)  IV 3.375 g (06/20/19 0808)  . [START ON 06/21/2019] vancomycin     PRN Meds:.acetaminophen **OR** acetaminophen  Allergies  Allergen Reactions  . Ambien [Zolpidem Tartrate]    Review of Systems  Unable to perform ROS: Dementia  Constitutional:       He had no complaints; denied pain    Physical Exam Vitals signs and nursing note reviewed.  Constitutional:      General: He is not in acute distress.    Comments: Elderly, frail   Cardiovascular:     Rate and   Rhythm: Normal rate.  Pulmonary:     Effort: Pulmonary effort is normal. No tachypnea, accessory muscle usage or respiratory distress.  Abdominal:     General: Abdomen is flat.  Neurological:     Mental Status: He is alert.     Comments: Oriented to person only     Vital Signs: BP (!) 167/72   Pulse 72   Temp (!) 97.5 F (36.4 C) (Oral)   Resp (!) 25   Ht 6' (1.829 m)   Wt 85.5 kg   SpO2 100%   BMI 25.56 kg/m  Pain Scale: PAINAD       SpO2: SpO2: 100 % O2 Device:SpO2: 100 % O2 Flow Rate: .O2 Flow Rate (L/min): 2 L/min  IO: Intake/output summary:   Intake/Output Summary (Last 24 hours) at 06/20/2019 0909 Last data filed at 06/20/2019 0256 Gross per 24 hour  Intake 2950 ml  Output 0 ml  Net 2950 ml    LBM: Last BM Date: (prior to arrival) Baseline Weight: Weight: 88 kg Most recent weight: Weight: 85.5 kg     Palliative Assessment/Data:     Time In: 1400 Time Out: 1520 Time Total: 80 min Greater than 50%  of this time was spent counseling and coordinating care related to the above assessment and plan.  Signed by:  , NP Palliative Medicine Team Pager # [phone] (M-F 8a-5p) Team Phone # [phone] (Nights/Weekends)                

## 2019-06-20 NOTE — ED Notes (Signed)
Wife called and updated on patient's room assignment and given the number to the nurses station on 4E. Wife stated she had spoken as well to the admitting physician

## 2019-06-20 NOTE — Progress Notes (Signed)
Attempted report no answer

## 2019-06-20 NOTE — Progress Notes (Signed)
Patient wife called and updated by nurse

## 2019-06-20 NOTE — Progress Notes (Signed)
Critical Ptt 161. Will pass along to dayshift

## 2019-06-20 NOTE — Progress Notes (Signed)
MD made aware of Vascular U/S results via text page

## 2019-06-20 NOTE — Progress Notes (Signed)
ANTICOAGULATION CONSULT NOTE - Initial Consult  Pharmacy Consult for heparin Indication: pulmonary embolus  Allergies  Allergen Reactions  . Ambien [Zolpidem Tartrate]     Patient Measurements: Height: 5\' 9"  (175.3 cm) Weight: 194 lb 0.1 oz (88 kg) IBW/kg (Calculated) : 70.7  Vital Signs: Temp: 101.8 F (38.8 C) (07/11 2233) Temp Source: Rectal (07/11 2233) BP: 149/74 (07/12 0100) Pulse Rate: 96 (07/12 0100)  Labs: Recent Labs    06/19/19 2225  HGB 13.6  HCT 43.1  PLT 157  APTT 24  LABPROT 14.5  INR 1.1  CREATININE 1.50*  TROPONINIHS 11    Estimated Creatinine Clearance: 41.7 mL/min (A) (by C-G formula based on SCr of 1.5 mg/dL (H)).   Medical History: Past Medical History:  Diagnosis Date  . Abdominal aortic aneurysm, without rupture ([place])   . HTN (hypertension)   . Parkinson disease ([place])   . Vascular dementia [facility])     Assessment: 84yo male presents to ED for weakness, found to be hypoxic >> CT reveals PNA and PE w/ evidence of RHS, started on ABX and now to start heparin.  Goal of Therapy:  Heparin level 0.3-0.7 units/ml Monitor platelets by anticoagulation protocol: Yes   Plan:  Will give heparin 4000 units IV bolus x1 followed by gtt at 1400 units/hr and monitor heparin levels and CBC.  Wynona Neat, PharmD, BCPS  06/20/2019,1:24 AM

## 2019-06-20 NOTE — Progress Notes (Signed)
VASCULAR LAB PRELIMINARY  PRELIMINARY  PRELIMINARY  PRELIMINARY  Bilateral lower extremity venous duplex completed.    Preliminary report:  See CV proc for preliminary results.  Gave critical results to Earlie Server, RN, who will text the doctor.   Lorance Pickeral, RVT 06/20/2019, 10:27 AM

## 2019-06-21 DIAGNOSIS — A419 Sepsis, unspecified organism: Principal | ICD-10-CM

## 2019-06-21 DIAGNOSIS — J189 Pneumonia, unspecified organism: Secondary | ICD-10-CM

## 2019-06-21 LAB — HEPARIN LEVEL (UNFRACTIONATED): Heparin Unfractionated: 0.36 IU/mL (ref 0.30–0.70)

## 2019-06-21 LAB — BASIC METABOLIC PANEL
Anion gap: 14 (ref 5–15)
BUN: 42 mg/dL — ABNORMAL HIGH (ref 8–23)
CO2: 18 mmol/L — ABNORMAL LOW (ref 22–32)
Calcium: 8.5 mg/dL — ABNORMAL LOW (ref 8.9–10.3)
Chloride: 103 mmol/L (ref 98–111)
Creatinine, Ser: 2.87 mg/dL — ABNORMAL HIGH (ref 0.61–1.24)
GFR calc Af Amer: 23 mL/min — ABNORMAL LOW (ref >60)
GFR calc non Af Amer: 20 mL/min — ABNORMAL LOW (ref >60)
Glucose, Bld: 115 mg/dL — ABNORMAL HIGH (ref 70–99)
Potassium: 3.9 mmol/L (ref 3.5–5.1)
Sodium: 135 mmol/L (ref 135–145)

## 2019-06-21 LAB — CBC
HCT: 33.9 % — ABNORMAL LOW (ref 39.0–52.0)
Hemoglobin: 11.3 g/dL — ABNORMAL LOW (ref 13.0–17.0)
MCH: 30.6 pg (ref 26.0–34.0)
MCHC: 33.3 g/dL (ref 30.0–36.0)
MCV: 91.9 fL (ref 80.0–100.0)
Platelets: 125 10*3/uL — ABNORMAL LOW (ref 150–400)
RBC: 3.69 MIL/uL — ABNORMAL LOW (ref 4.22–5.81)
RDW: 14.5 % (ref 11.5–15.5)
WBC: 15.7 10*3/uL — ABNORMAL HIGH (ref 4.0–10.5)
nRBC: 0 % (ref 0.0–0.2)

## 2019-06-21 LAB — URINE CULTURE: Culture: NO GROWTH

## 2019-06-21 LAB — MAGNESIUM: Magnesium: 1.7 mg/dL (ref 1.7–2.4)

## 2019-06-21 LAB — VANCOMYCIN, RANDOM: Vancomycin Rm: 32

## 2019-06-21 LAB — CREATININE, SERUM
Creatinine, Ser: 3.31 mg/dL — ABNORMAL HIGH (ref 0.61–1.24)
GFR calc Af Amer: 19 mL/min — ABNORMAL LOW (ref >60)
GFR calc non Af Amer: 16 mL/min — ABNORMAL LOW (ref >60)

## 2019-06-21 LAB — MRSA PCR SCREENING: MRSA by PCR: NEGATIVE

## 2019-06-21 LAB — PROCALCITONIN: Procalcitonin: 22.33 ng/mL

## 2019-06-21 MED ORDER — APIXABAN 5 MG PO TABS
10.0000 mg | ORAL_TABLET | Freq: Two times a day (BID) | ORAL | Status: DC
  Administered 2019-06-21: 09:00:00 10 mg via ORAL
  Filled 2019-06-21: qty 2

## 2019-06-21 MED ORDER — SODIUM CHLORIDE 0.9 % IV SOLN
INTRAVENOUS | Status: DC
  Administered 2019-06-21 – 2019-06-25 (×7): via INTRAVENOUS

## 2019-06-21 MED ORDER — SODIUM CHLORIDE 0.9 % IV SOLN
1.0000 g | INTRAVENOUS | Status: DC
  Administered 2019-06-21 – 2019-06-25 (×5): 1 g via INTRAVENOUS
  Filled 2019-06-21 (×4): qty 1
  Filled 2019-06-21: qty 10

## 2019-06-21 MED ORDER — SODIUM CHLORIDE 0.9 % IV BOLUS
1000.0000 mL | Freq: Once | INTRAVENOUS | Status: AC
  Administered 2019-06-21: 1000 mL via INTRAVENOUS

## 2019-06-21 MED ORDER — HEPARIN (PORCINE) 25000 UT/250ML-% IV SOLN: 1200.0000 [IU]/h | INTRAVENOUS | Status: DC

## 2019-06-21 MED ORDER — VANCOMYCIN VARIABLE DOSE PER UNSTABLE RENAL FUNCTION (PHARMACIST DOSING): Status: DC

## 2019-06-21 MED ORDER — APIXABAN 5 MG PO TABS: 5.0000 mg | ORAL_TABLET | Freq: Two times a day (BID) | ORAL | Status: DC

## 2019-06-21 MED ORDER — LEVOTHYROXINE SODIUM 25 MCG PO TABS
25.0000 ug | ORAL_TABLET | Freq: Every day | ORAL | Status: DC
  Administered 2019-06-21 – 2019-06-25 (×5): 25 ug via ORAL
  Filled 2019-06-21 (×5): qty 1

## 2019-06-21 MED ORDER — SODIUM CHLORIDE 0.9 % IV SOLN
500.0000 mg | INTRAVENOUS | Status: AC
  Administered 2019-06-21 – 2019-06-25 (×5): 500 mg via INTRAVENOUS
  Filled 2019-06-21 (×7): qty 500

## 2019-06-21 MED ORDER — ENSURE ENLIVE PO LIQD
237.0000 mL | Freq: Two times a day (BID) | ORAL | Status: DC
  Administered 2019-06-22 – 2019-06-25 (×7): 237 mL via ORAL

## 2019-06-21 MED ORDER — HEPARIN (PORCINE) 25000 UT/250ML-% IV SOLN
1200.0000 [IU]/h | INTRAVENOUS | Status: AC
  Administered 2019-06-22 – 2019-06-23 (×2): 1200 [IU]/h via INTRAVENOUS
  Filled 2019-06-21 (×3): qty 250

## 2019-06-21 NOTE — Progress Notes (Signed)
Occupational Therapy Evaluation Patient Details Name: Christian Wiley MRN: [id number] DOB: Aug 20, 1935 Today's Date: 06/21/2019    History of Present Illness Christian Wiley is a 84 y.o. male with medical history significant for vascular dementia, Parkinson's disease, hypothyroidism, GERD, hyperlipidemia, BPH, RBBB, history of PE not on anticoagulation, AAA status post endovascular repair, depression who presents to the ED for evaluation of altered mental status after a fall.   Pt with sepsis with PNA, Bil PE,  and LLE DVT.    Clinical Impression   PTA, pt lived at home with his wife and was ambulating with a walker with min A of elderly wife. Wife assisted with bathing and dressing. Pt enjoys listening to "50s rock and roll" and talking about sports. Pt presents with significant functional decline and requires Mod A +2 with sit - stand using Stedy and Max A with ADL tasks. Pt will benefit from rehab at SNF. Will follow acutely.     Follow Up Recommendations  SNF;Supervision/Assistance - 24 hour    Equipment Recommendations  None recommended by OT    Recommendations for Other Services       Precautions / Restrictions Precautions Precautions: Fall Restrictions Weight Bearing Restrictions: No      Mobility Bed Mobility Overal bed mobility: Needs Assistance Bed Mobility: Rolling;Sit to Supine;Supine to Sit Rolling: Mod assist   Supine to sit: Mod assist Sit to supine: Mod assist      Transfers Overall transfer level: Needs assistance   Transfers: Sit to/from Stand Sit to Stand: Mod assist;From elevated surface;+2 physical assistance         General transfer comment: Posture improved with repetition but unable to move flaps behind bottom    Balance Overall balance assessment: Needs assistance Sitting-balance support: No upper extremity supported;Feet supported Sitting balance-Leahy Scale: Fair Sitting balance - Comments: Able to sit EOB with min guard assist.    Standing balance support: During functional activity;Single extremity supported Standing balance-Leahy Scale: Poor                             ADL either performed or assessed with clinical judgement   ADL Overall ADL's : Needs assistance/impaired Eating/Feeding: Minimal assistance;Sitting   Grooming: Moderate assistance;Sitting   Upper Body Bathing: Moderate assistance;Sitting   Lower Body Bathing: Maximal assistance;Sit to/from stand   Upper Body Dressing : Maximal assistance;Sitting   Lower Body Dressing: Maximal assistance;Sit to/from stand       Toileting- Water quality scientist and Hygiene: Total assistance Toileting - Clothing Manipulation Details (indicate cue type and reason): incontinenet during session     Functional mobility during ADLs: Moderate assistance;+2 for physical assistance General ADL Comments: unable to achieve full upright posture using Stedy. Unable to move flaps behind bottom. Pt sits unexpectedly and only alb eot tolerate short periods of standing although completed @ 10 repetitions of sit - stnad.      Vision         Perception     Praxis      Pertinent Vitals/Pain Pain Assessment: Faces Faces Pain Scale: No hurt     Hand Dominance Right   Extremity/Trunk Assessment Upper Extremity Assessment Upper Extremity Assessment: Generalized weakness   Lower Extremity Assessment Lower Extremity Assessment: Defer to PT evaluation   Cervical / Trunk Assessment Cervical / Trunk Assessment: Other exceptions(stong posterior lean)   Communication Communication Communication: Other (comment)(Difficult to understand pt as he was coughing throughout eva)   Cognition Arousal/Alertness:  Awake/alert Behavior During Therapy: Flat affect Overall Cognitive Status: History of cognitive impairments - at baseline Area of Impairment: Orientation;Following commands;Safety/judgement;Awareness;Problem solving;Attention;Memory                  Orientation Level: Disoriented to;Time;Situation;Place(Able to state [place]) Current Attention Level: Sustained Memory: Decreased short-term memory Following Commands: Follows one step commands with increased time Safety/Judgement: Decreased awareness of safety;Decreased awareness of deficits Awareness: Intellectual Problem Solving: Slow processing;Difficulty sequencing;Requires verbal cues;Requires tactile cues;Decreased initiation General Comments: Dementia per chart   General Comments  Likes "rock and roll"; enjoyed playing sports    Exercises Exercises: Other exercises Other Exercises Other Exercises: sit - stand with use of Stedy x 10   Shoulder Instructions      Home Living Family/patient expects to be discharged to:: Skilled nursing facility Living Arrangements: Spouse/significant other Available Help at Discharge: Family;Available 24 hours/day Type of Home: House                                  Prior Functioning/Environment Level of Independence: Needs assistance  Gait / Transfers Assistance Needed: Was walking with a cane until June, then has been weaker adn spending more time in bed. Wife asssted him with a walker until recent fall ADL's / Homemaking Assistance Needed: needs assistance with bathing and dressing. Recently hired outsdie help with bathing            OT Problem List: Decreased strength;Decreased range of motion;Decreased activity tolerance;Impaired balance (sitting and/or standing);Decreased coordination;Decreased cognition;Decreased safety awareness;Decreased knowledge of use of DME or AE;Cardiopulmonary status limiting activity      OT Treatment/Interventions: Self-care/ADL training;Therapeutic exercise;Neuromuscular education;DME and/or AE instruction;Therapeutic activities;Cognitive remediation/compensation;Patient/family education;Balance training    OT Goals(Current goals can be found in the care plan section) Acute Rehab  OT Goals Patient Stated Goal: unable to state OT Goal Formulation: With patient/family Time For Goal Achievement: 07/05/19 Potential to Achieve Goals: Good  OT Frequency: Min 2X/week   Barriers to D/C:            Co-evaluation              AM-PAC OT "6 Clicks" Daily Activity     Outcome Measure Help from another person eating meals?: A Little Help from another person taking care of personal grooming?: A Little Help from another person toileting, which includes using toliet, bedpan, or urinal?: Total Help from another person bathing (including washing, rinsing, drying)?: A Lot Help from another person to put on and taking off regular upper body clothing?: A Lot Help from another person to put on and taking off regular lower body clothing?: A Lot 6 Click Score: 13   End of Session Equipment Utilized During Treatment: Gait belt Nurse Communication: Mobility status;Need for lift equipment  Activity Tolerance: Patient tolerated treatment well Patient left: in bed;with call bell/phone within reach;with bed alarm set;with family/visitor present(Chair position)  OT Visit Diagnosis: Unsteadiness on feet (R26.81);Other abnormalities of gait and mobility (R26.89);Muscle weakness (generalized) (M62.81);History of falling (Z91.81);Other symptoms and signs involving cognitive function                Time: 4944-9675 OT Time Calculation (min): 45 min Charges:  OT General Charges $OT Visit: 1 Visit OT Evaluation $OT Eval Moderate Complexity: 1 Mod OT Treatments $Self Care/Home Management : 23-37 mins  Maurie Boettcher, OT/L   Acute OT Clinical Specialist [place] Pager [phone] Office [phone]  Christian Wiley,Christian Wiley 06/21/2019, 4:59 PM

## 2019-06-21 NOTE — Progress Notes (Addendum)
Palliative:  HPI: 84 y.o. male  with past medical history of vascular dementia, Parkinson's disease, hypothyroidism, GERD, HLD, BPH, h/o PE 2011 and 2014 (no anticoagulation s/t type II endoleak), AAA s/p endovascular repair, depression admitted on 06/19/2019 from home with AMS and fall and found to have submassive saddle PE with right heart strain as well as bilateral multifocal pneumonia with concern from family about aspiration at home. He continue 06/21/19 with coughing with any intake but with renal decline.   Mr. Biondolillo is much unchanged today. He continues to be alert and pleasantly confused without distress. He has on mittens today as he was more confused and restless last night and trying to protect IV.   I called and spoke with Mrs. Aboud (she plans to come visit later today but unsure when). She has just received update from RN. We spoke about decline in renal function and she is understandable concerned. We discussed that we monitoring his hydration status, adjusting medications, and carefully monitoring renal function as well as avoiding medications/interventions that could further harm the kidneys. She understands that if his renal function continues to decline that options are limited.  I did not discuss specifically with her but I do not feel that they would desire dialysis given our conversation yesterday (as well as that he would be an extremely poor candidate for dialysis given underlying dementia and Parkinson's).   I reassured Mrs. Zielinski that even though her husband is critically ill with multiple serious acute issues (pneumonia sepsis, PE) that he continues to appear without distress of any kind. He appears comfortable and pleasant. I reminded her that we will continue to support them and make sure that he continues to be comfortable and without distress.   All questions/concerns addressed. Emotional support provided. HR regular rate and rhythm.   Exam: Alert, confused. No  distress. Pleasantly confused. + cough. Breathing regular, unlabored.   Plan: - Continue supportive care.  - Comfort is priority.  - Overall wife does not seem interested in aggressive or invasive interventions but will need to be further discussed as it arises.   [place], NP Palliative Medicine Team Pager [phone] (Please see [email or web address] for schedule) Team Phone [phone]    Greater than 50%  of this time was spent counseling and coordinating care related to the above assessment and plan

## 2019-06-21 NOTE — Plan of Care (Signed)
  Problem: Education: Goal: Knowledge of General Education information will improve Description: Including pain rating scale, medication(s)/side effects and non-pharmacologic comfort measures Outcome: Not Progressing   Problem: Health Behavior/Discharge Planning: Goal: Ability to manage health-related needs will improve Outcome: Not Progressing   Problem: Clinical Measurements: Goal: Diagnostic test results will improve Outcome: Not Progressing   Problem: Activity: Goal: Risk for activity intolerance will decrease Outcome: Not Progressing

## 2019-06-21 NOTE — Progress Notes (Signed)
PROGRESS NOTE    Christian Wiley  XBM:[id number] DOB: [birth year] DOA: 06/19/2019 PCP: [name], MD    Brief Narrative:   Christian Wiley is a 84 y.o. male with medical history significant for vascular dementia, Parkinson's disease, hypothyroidism, GERD, hyperlipidemia, BPH, RBBB, history of PE not on anticoagulation, AAA status post endovascular repair, depression who presents to the ED for evaluation of altered mental status after a fall.  Patient is unable to provide any history therefore entirety of history is obtained from EDP, chart review, and wife by phone.  Wife states that patient at baseline is nonverbal and only occasionally alert.  He needs help with all of his ADLs but can walk with the use of a walker.  She states that he seems to be choking every time he eats. She says that he has been treated for UTI with cefdinir completing day 6 of 7 so far.  She says today while walking with his walker he began to fall forwards.  Luckily his wife was by and was unable to catch him and slowly sliding down to the ground.  She says he did have a small abrasion to his elbow but did not hit his head.  She called EMS and per EDP patient was noted to be hypoxic requiring nonrebreather and was brought to the ED for further evaluation.  ED Course: Initial vitals showed BP 133/55, pulse 88, RR 30, temp 101.8 Fahrenheit rectally, SPO2 97% on 4 L supplemental O2 via [place].  Labs are notable for WBC 14.4, hemoglobin 13.6, platelets 157,000, sodium 130, potassium 4.5, bicarb 20, BUN 28, creatinine 1.5, serum glucose 150, high-sensitivity troponin IL-11, lactic acid 4.1, SARS-CoV-2 test negative.  Urinalysis showed negative nitrites, trace leukocytes, 21-50 RBC/hpf, 0-5 WBC /hpf, rare bacteria on microscopy.  Blood cultures were drawn and pending.  Portable chest x-ray showed mild patchy bilateral lower lung opacities.  CT head without contrast was negative for acute intracranial abnormality.   Atrophy and mild small vessel ischemic changes of the white matter was noted.  CTA chest PE study showed an acute PE involving the distal right main pulmonary artery with extension into the right lobar artery and upper and lower lobe segmental and subsegmental branch vessels.  A small left lower lobe subsegmental PE was also seen.  CT findings were positive for evidence of right heart strain consistent with at least submassive PE.  Changes suggestive of multifocal pneumonia worse in the bilateral lower lobes was also seen.  Patient was given 2.6 L normal saline and started on IV vancomycin and Zosyn.  PCCM were consulted who recommended hospitalist admission.  The hospitalist service was consulted to admit for further evaluation and management.  Assessment & Plan:   Principal Problem:   Sepsis due to pneumonia [facility]) Active Problems:   Parkinson disease ([place])   Vascular dementia ([place])   Hypothyroidism   History of pulmonary embolus (PE)   Depression with anxiety   Hyperlipidemia   BPH (benign prostatic hyperplasia)   AKI (acute kidney injury) ([place])   Acute pulmonary embolism (HCC)   Goals of care, counseling/discussion   Palliative care encounter   Submassive bilateral pulmonary embolism with right heart strain Acute DVT left common femoral vein, left popliteal vein, left peroneal vein With CT evidence of right heart strain.  Patient has a history of PE and per wife was on Xarelto until  1 year ago which was discontinued due to a type II endoleak found on angiography seemingly resolution of the  leak after holding anticoagulation.  Vascular ultrasound duplex bilateral lower extremities notable for acute DVT on left.  Transthoracic echocardiogram with EF 32-35%, diastolic dysfunction, no mention of heart strain. --PCCM following, appreciate assistance --Continue IV heparin drip; will await transition to Eliquis until renal function improves --Continue supplemental oxygen; titrate for  SPO2 greater than 92%  Pneumonia, multifocal; possible aspiration Wife reports behavior suggestive of aspiration.  CT PA notable for multifocal pneumonia.  Procalcitonin elevated at 15.50 with a lactic acid of 1.6.  COVID-19 test negative. --WBC 14.4-->16.0-->15.7 --Blood cultures x 2: NG x 24h --MRSA PCR negative --We will change antibiotics from vancomycin/Zosyn to azithromycin/ceftriaxone today --Dysphagia diet per speech therapy --Daily CBC  Dysphasia with possible aspiration: Discussed with patient's spouse, [name] today regarding his issues on swallowing.  She has noticed that he has significant cough while eating that has been present over the past 2 weeks.  Concern for possible aspiration given pneumonia as above.  Discussed with her that if he is unable to eat or drink orally, that he has advanced directives that would decline use of artificial means with tube feeding. --Speech therapy following, no signs of aspiration on MBS S yesterday --Dysphagia diet --Aspiration precautions  Acute kidney injury: Creatinine 1.50 on admission. Likely prerenal, received 2.6 L normal saline in the ED.   --Creatinine 1.50-->1.42-->2.87 --NS infusion --Avoid nephrotoxins, renally dose all medications --Daily BMP  Hyponatremia: Likely from hypovolemia, received 2.6 L normal saline bolus in ED. --Na 130-->135-->135 --NS infusion --Continue to monitor daily BMP  Hyperlipidemia: --continue home atorvastatin 40 mg daily  Hypothyroidism: --Continue Synthroid 25 mcg p.o. daily  Depression: --Lexapro 10 mg p.o. daily  Vascular Parkinsonism and dementia with behavioral disturbance Failure to thrive/progressive functional decline Patient with advanced vascular parkinsonism and dementia, now essentially nonverbal and unable to perform ADLs per wife.  Is able to ambulate some with the use of a walker otherwise quality of life is poor.  I do long discussion with his wife by phone who states  that he has previously told his wife and daughter that in the event of cardiac arrest or significant respiratory distress he would not want to be resuscitated or have artificial means of nutrition. --Continue Nuplazid 34 mg daily --Palliative care consult for assistance with further medical decision making/goals of care and family support    DVT prophylaxis: Heparin drip Code Status: DNR Family Communication: Discussed with patient's spouse, [name] over telephone today Disposition Plan: To be determined   Consultants:   Palliative care  Procedures:  VAS Bilateral LE Duplex Ultrasound Summary: Right: There is no evidence of deep vein thrombosis in the lower extremity. Left: Findings consistent with acute deep vein thrombosis involving the left common femoral vein, left popliteal vein, and left peroneal veins. Mobile thrombus noted in the left common femoral and external iliac veins  Transthoracic echocardiogram: Pending  MBSS: pending  Antimicrobials:   Vancomycin 7/11 - 7/13  Zosyn 7/11 - 7/13  Azithromycin 7/13>>  Ceftriaxone 7/13>>   Subjective:  Patient seen and examined at bedside, pleasantly confused.  Nursing reports severe coughing episode when trying to eat breakfast this morning.  Was seen by speech therapy yesterday, cleared for dysphagia diet with no signs of aspiration noted on MBSS.  Continues on IV heparin for acute PE/DVT.  Creatinine increased today, will hold off on transition to Eliquis until renal function improves.  MRSA PCR negative, will de-escalate antibiotics in accordance with committee acquired pneumonia guidelines.  No other complaints today from patient.  Denies headache, no shortness of breath, no chest pain, no abdominal pain.  No other acute concerns overnight per nursing staff.  Objective: Vitals:   06/20/19 2042 06/21/19 0004 06/21/19 0350 06/21/19 0845  BP: (!) 159/66 (!) 124/92  (!) 141/72  Pulse: 70 69  70  Resp: 18 (!) 25  (!) 30    Temp: 98.2 F (36.8 C) 97.8 F (36.6 C) 98.7 F (37.1 C) 97.9 F (36.6 C)  TempSrc: Oral Oral Oral Oral  SpO2: 97% 98%  95%  Weight:   86.2 kg   Height:        Intake/Output Summary (Last 24 hours) at 06/21/2019 1304 Last data filed at 06/21/2019 0449 Gross per 24 hour  Intake 278.87 ml  Output 225 ml  Net 53.87 ml   Filed Weights   06/20/19 0310 06/20/19 0555 06/21/19 0350  Weight: 85.4 kg 85.5 kg 86.2 kg    Examination:  General exam: Appears calm and comfortable, pleasantly confused Respiratory system: Coarse breath sounds bilaterally, slightly decreased bilateral bases, no wheezing, normal respiratory effort, on 2 L nasal cannula Cardiovascular system: S1 & S2 heard, RRR. No JVD, murmurs, rubs, gallops or clicks. No pedal edema. Gastrointestinal system: Abdomen is nondistended, soft and nontender. No organomegaly or masses felt. Normal bowel sounds heard. Central nervous system: Alert, pleasantly confused, no focal neurological deficits Extremities: Symmetric 5 x 5 power. Skin: No rashes, lesions or ulcers Psychiatry: Pleasantly confused, normal mood/affect    Data Reviewed: I have personally reviewed following labs and imaging studies  CBC: Recent Labs  Lab 06/19/19 2225 06/20/19 0353 06/21/19 0344  WBC 14.4* 16.0* 15.7*  NEUTROABS 9.1*  --   --   HGB 13.6 12.0* 11.3*  HCT 43.1 36.4* 33.9*  MCV 97.1 93.1 91.9  PLT 157 121* 962*   Basic Metabolic Panel: Recent Labs  Lab 06/19/19 2225 06/20/19 0353 06/21/19 0344 06/21/19 0902  NA 130* 135 135  --   K 4.5 3.6 3.9  --   CL 98 104 103  --   CO2 20* 22 18*  --   GLUCOSE 150* 136* 115*  --   BUN 28* 25* 42*  --   CREATININE 1.50* 1.42* 2.87* 3.31*  CALCIUM 8.6* 8.1* 8.5*  --   MG  --   --  1.7  --    GFR: Estimated Creatinine Clearance: 18.9 mL/min (A) (by C-G formula based on SCr of 3.31 mg/dL (H)). Liver Function Tests: Recent Labs  Lab 06/19/19 2225  AST 24  ALT 29  ALKPHOS 96  BILITOT 0.9   PROT 5.8*  ALBUMIN 3.1*   No results for input(s): LIPASE, AMYLASE in the last 168 hours. No results for input(s): AMMONIA in the last 168 hours. Coagulation Profile: Recent Labs  Lab 06/19/19 2225 06/20/19 0353  INR 1.1 1.4*   Cardiac Enzymes: No results for input(s): CKTOTAL, CKMB, CKMBINDEX, TROPONINI in the last 168 hours. BNP (last 3 results) No results for input(s): PROBNP in the last 8760 hours. HbA1C: No results for input(s): HGBA1C in the last 72 hours. CBG: No results for input(s): GLUCAP in the last 168 hours. Lipid Profile: No results for input(s): CHOL, HDL, LDLCALC, TRIG, CHOLHDL, LDLDIRECT in the last 72 hours. Thyroid Function Tests: Recent Labs    06/20/19 0353  TSH 1.781   Anemia Panel: No results for input(s): VITAMINB12, FOLATE, FERRITIN, TIBC, IRON, RETICCTPCT in the last 72 hours. Sepsis Labs: Recent Labs  Lab 06/19/19 2225 06/20/19 0040 06/20/19 0353 06/21/19 0344  PROCALCITON  --   --  15.51 22.33  LATICACIDVEN 4.1* 1.6  --   --     Recent Results (from the past 240 hour(s))  Blood culture (routine x 2)     Status: None (Preliminary result)   Collection Time: 06/19/19 10:10 PM   Specimen: BLOOD RIGHT FOREARM  Result Value Ref Range Status   Specimen Description BLOOD RIGHT FOREARM  Final   Special Requests   Final    BOTTLES DRAWN AEROBIC AND ANAEROBIC Blood Culture adequate volume   Culture   Final    NO GROWTH < 24 HOURS Performed at [facility], [place] [address] [place], [place] [address]    Report Status PENDING  Incomplete  Urine culture     Status: None   Collection Time: 06/19/19 10:28 PM   Specimen: Urine, Clean Catch  Result Value Ref Range Status   Specimen Description URINE, CLEAN CATCH  Final   Special Requests NONE  Final   Culture   Final    NO GROWTH Performed at [facility], [place] [address] [place], [place] [address]    Report Status 06/21/2019 FINAL  Final  SARS Coronavirus 2 (CEPHEID- Performed  in [place] hospital lab), Hosp Order     Status: None   Collection Time: 06/19/19 10:28 PM   Specimen: Urine, Catheterized; Nasopharyngeal  Result Value Ref Range Status   SARS Coronavirus 2 NEGATIVE NEGATIVE Final    Comment: (NOTE) If result is NEGATIVE SARS-CoV-2 target nucleic acids are NOT DETECTED. The SARS-CoV-2 RNA is generally detectable in upper and lower  respiratory specimens during the acute phase of infection. The lowest  concentration of SARS-CoV-2 viral copies this assay can detect is 250  copies / mL. A negative result does not preclude SARS-CoV-2 infection  and should not be used as the sole basis for treatment or other  patient management decisions.  A negative result may occur with  improper specimen collection / handling, submission of specimen other  than nasopharyngeal swab, presence of viral mutation(s) within the  areas targeted by this assay, and inadequate number of viral copies  (<250 copies / mL). A negative result must be combined with clinical  observations, patient history, and epidemiological information. If result is POSITIVE SARS-CoV-2 target nucleic acids are DETECTED. The SARS-CoV-2 RNA is generally detectable in upper and lower  respiratory specimens dur ing the acute phase of infection.  Positive  results are indicative of active infection with SARS-CoV-2.  Clinical  correlation with patient history and other diagnostic information is  necessary to determine patient infection status.  Positive results do  not rule out bacterial infection or co-infection with other viruses. If result is PRESUMPTIVE POSTIVE SARS-CoV-2 nucleic acids MAY BE PRESENT.   A presumptive positive result was obtained on the submitted specimen  and confirmed on repeat testing.  While 2019 novel coronavirus  (SARS-CoV-2) nucleic acids may be present in the submitted sample  additional confirmatory testing may be necessary for epidemiological  and / or clinical  management purposes  to differentiate between  SARS-CoV-2 and other Sarbecovirus currently known to infect humans.  If clinically indicated additional testing with an alternate test  methodology [phone]) is advised. The SARS-CoV-2 RNA is generally  detectable in upper and lower respiratory sp ecimens during the acute  phase of infection. The expected result is Negative. Fact Sheet for Patients:  StrictlyIdeas.no Fact Sheet for Healthcare Providers: BankingDealers.co.za This test is not yet approved or cleared by  the [employer] and has been authorized for detection and/or diagnosis of SARS-CoV-2 by FDA under an Emergency Use Authorization (EUA).  This EUA will remain in effect (meaning this test can be used) for the duration of the COVID-19 declaration under Section 564(b)(1) of the Act, 21 U.S.C. section 360bbb-3(b)(1), unless the authorization is terminated or revoked sooner. Performed at [facility], [place] [address], [place], [place] [address]   Blood culture (routine x 2)     Status: None (Preliminary result)   Collection Time: 06/19/19 10:32 PM   Specimen: BLOOD  Result Value Ref Range Status   Specimen Description BLOOD LEFT ANTECUBITAL  Final   Special Requests   Final    BOTTLES DRAWN AEROBIC AND ANAEROBIC Blood Culture adequate volume   Culture   Final    NO GROWTH < 24 HOURS Performed at [facility], [place] [address], [place], [place] [address]    Report Status PENDING  Incomplete  MRSA PCR Screening     Status: None   Collection Time: 06/21/19  8:33 AM   Specimen: Nasal Mucosa; Nasopharyngeal  Result Value Ref Range Status   MRSA by PCR NEGATIVE NEGATIVE Final    Comment:        The GeneXpert MRSA Assay (FDA approved for NASAL specimens only), is one component of a comprehensive MRSA colonization surveillance program. It is not intended to diagnose MRSA infection nor to guide or monitor treatment  for MRSA infections. Performed at [facility], [place] [address], [place], [place] [address]          Radiology Studies: Ct Head Wo Contrast  Result Date: 06/20/2019 CLINICAL DATA:  Altered LOC EXAM: CT HEAD WITHOUT CONTRAST TECHNIQUE: Contiguous axial images were obtained from the base of the skull through the vertex without intravenous contrast. COMPARISON:  None. FINDINGS: Brain: No acute territorial infarction, hemorrhage or intracranial mass. Moderate atrophy. Mild small vessel ischemic changes of the white matter. Prominent ventricles secondary to atrophy Vascular: No hyperdense vessels.  Carotid vascular calcification Skull: Normal. Negative for fracture or focal lesion. Sinuses/Orbits: Chronic appearing deformity of right maxillary sinus. Sinuses are clear Other: None IMPRESSION: 1. No CT evidence for acute intracranial abnormality. 2. Atrophy and mild small vessel ischemic changes of the white matter Electronically Signed   By: Donavan Foil M.D.   On: 06/20/2019 00:40   Ct Angio Chest Pe W And/or Wo Contrast  Result Date: 06/20/2019 CLINICAL DATA:  Shortness of breath EXAM: CT ANGIOGRAPHY CHEST WITH CONTRAST TECHNIQUE: Multidetector CT imaging of the chest was performed using the standard protocol during bolus administration of intravenous contrast. Multiplanar CT image reconstructions and MIPs were obtained to evaluate the vascular anatomy. CONTRAST:  64mL OMNIPAQUE IOHEXOL 350 MG/ML SOLN COMPARISON:  Chest x-ray 06/19/2019 FINDINGS: Cardiovascular: Satisfactory opacification of the pulmonary arteries to the segmental level. Acute saddle embolus on the right, with thrombus visualized in the right upper lobe segmental branch vessels, distal right main pulmonary artery, descending right pulmonary artery and right lower lobe segmental and subsegmental branch vessels. Small nonocclusive emboli within left lower lobe subsegmental branch vessels. RV LV ratio is elevated at 1.3. Moderate  aortic atherosclerosis without aneurysm. Coronary vascular calcification. Normal heart size. No pericardial effusion Mediastinum/Nodes: Midline trachea. No thyroid mass. No significant adenopathy. Mild distal esophageal thickening with hiatal hernia Lungs/Pleura: Consolidation and ground-glass density in the bilateral lower lobes. Central lobular foci of ground-glass density and nodularity within the bilateral upper lobes, also suspicious for  pneumonia. No pleural effusion. Upper Abdomen: No acute abnormality. Musculoskeletal: Degenerative changes. No acute or suspicious abnormality. Review of the MIP images confirms the above findings. IMPRESSION: 1. Positive for acute pulmonary emboli involving the distal right main pulmonary artery with extension of thrombus into right inter lobar artery and upper and lower lobe segmental and subsegmental branch vessels. Small subsegmental pulmonary emboli in the left lower lobe. Positive for acute PE with CT evidence of right heart strain (RV/LV Ratio = 1.3) consistent with at least submassive (intermediate risk) PE. The presence of right heart strain has been associated with an increased risk of morbidity and mortality. Please activate Code PE by paging [phone]. 2. Multifocal pneumonia, worst in the bilateral lower lobes. Critical Value/emergent results were called by telephone at the time of interpretation on 06/20/2019 at 12:52 am to Dr. Franchot Heidelberg , who verbally acknowledged these results. Aortic Atherosclerosis (ICD10-I70.0). Electronically Signed   By: Donavan Foil M.D.   On: 06/20/2019 00:52   Dg Chest Portable 1 View  Result Date: 06/19/2019 CLINICAL DATA:  Shortness of breath EXAM: PORTABLE CHEST 1 VIEW COMPARISON:  None. FINDINGS: Mild patchy lingular/left lower lobe opacity. Mild right basilar opacity. No frank interstitial edema.  No pleural effusion or pneumothorax. The heart is normal in size. Degenerative changes of the thoracic spine. IMPRESSION:  Mild patchy bilateral lower lung opacities, suspicious for pneumonia. Electronically Signed   By: Julian Hy M.D.   On: 06/19/2019 22:32   Dg Swallowing Func-speech Pathology  Result Date: 06/20/2019 Objective Swallowing Evaluation: Type of Study: Bedside Swallow Evaluation  Patient Details Name: TJAY VELAZQUEZ MRN: [id number] Date of Birth: 12-17-1934 Today's Date: 06/20/2019 Time: SLP Start Time (ACUTE ONLY): 1023 -SLP Stop Time (ACUTE ONLY): 1033 SLP Time Calculation (min) (ACUTE ONLY): 10 min Past Medical History: Past Medical History: Diagnosis Date  Abdominal aortic aneurysm, without rupture ([place])   HTN (hypertension)   Parkinson disease ([place])   Vascular dementia ([place])  Past Surgical History: No past surgical history on file. HPI: Christian Wiley is a 84 y.o. male with medical history significant for vascular dementia, Parkinson's disease, hypothyroidism, GERD, hyperlipidemia, BPH, RBBB, history of PE not on anticoagulation, AAA status post endovascular repair, depression who presents to the ED for evaluation of altered mental status after a fall.  Pt is nonverbal at baseline. Head CT 7/12 with no acute findings.  CXR 7/11 with BLL opacities concerning for pneumonia.  Per chart review, pt has hx of aspiration as reported by wife, but there is no further information available regarding baseline swallow function.  Subjective: Pt awake, alert, pleasant, participative Assessment / Plan / Recommendation CHL IP CLINICAL IMPRESSIONS 06/20/2019 Clinical Impression Pt presents with mild oropharyngeal dysphagia c/b nonrotary mastication, decreased lingual coordination, and delayed swallow initiation.  These deficits resulted in impaired bolus formation and delayed A-P transit, swallow initiation at the vallecula with solids, and piecemeal deglutition.  Pharyngeal residue was cleared with spontaneous subsequent swallows.  There was no penetration or aspiration with any consistencies.  Pt was noted to cough  during evaluation in absence of penetration/aspiration. Recommend regular texture diet with thin liquid. SLP Visit Diagnosis -- Attention and concentration deficit following -- Frontal lobe and executive function deficit following -- Impact on safety and function --   CHL IP TREATMENT RECOMMENDATION 06/20/2019 Treatment Recommendations Therapy as outlined in treatment plan below   Prognosis 06/20/2019 Prognosis for Safe Diet Advancement Good Barriers to Reach Goals -- Barriers/Prognosis Comment -- CHL IP DIET RECOMMENDATION 06/20/2019  SLP Diet Recommendations Regular solids;Thin liquid Liquid Administration via Cup;Straw Medication Administration Whole meds with liquid Compensations Minimize environmental distractions;Slow rate;Small sips/bites Postural Changes Seated upright at 90 degrees   CHL IP OTHER RECOMMENDATIONS 06/20/2019 Recommended Consults -- Oral Care Recommendations Oral care BID Other Recommendations --   No flowsheet data found.  CHL IP FREQUENCY AND DURATION 06/20/2019 Speech Therapy Frequency (ACUTE ONLY) min 1 x/week Treatment Duration 1 week      CHL IP ORAL PHASE 06/20/2019 Oral Phase -- Oral - Pudding Teaspoon -- Oral - Pudding Cup -- Oral - Honey Teaspoon -- Oral - Honey Cup -- Oral - Nectar Teaspoon -- Oral - Nectar Cup -- Oral - Nectar Straw -- Oral - Thin Teaspoon -- Oral - Thin Cup WFL Oral - Thin Straw WFL Oral - Puree Delayed oral transit;Lingual pumping Oral - Mech Soft -- Oral - Regular Impaired mastication;Delayed oral transit Oral - Multi-Consistency -- Oral - Pill Lingual pumping Oral Phase - Comment --  CHL IP PHARYNGEAL PHASE 06/20/2019 Pharyngeal Phase Impaired Pharyngeal- Pudding Teaspoon -- Pharyngeal -- Pharyngeal- Pudding Cup -- Pharyngeal -- Pharyngeal- Honey Teaspoon -- Pharyngeal -- Pharyngeal- Honey Cup -- Pharyngeal -- Pharyngeal- Nectar Teaspoon -- Pharyngeal -- Pharyngeal- Nectar Cup -- Pharyngeal -- Pharyngeal- Nectar Straw -- Pharyngeal -- Pharyngeal- Thin Teaspoon --  Pharyngeal -- Pharyngeal- Thin Cup [facility] Pharyngeal Material does not enter airway Pharyngeal- Thin Straw WFL Pharyngeal Material does not enter airway Pharyngeal- Puree Delayed swallow initiation-vallecula Pharyngeal Material does not enter airway Pharyngeal- Mechanical Soft -- Pharyngeal -- Pharyngeal- Regular Delayed swallow initiation-vallecula Pharyngeal Material does not enter airway Pharyngeal- Multi-consistency -- Pharyngeal -- Pharyngeal- Pill Delayed swallow initiation-vallecula Pharyngeal Material does not enter airway Pharyngeal Comment --  CHL IP CERVICAL ESOPHAGEAL PHASE 06/20/2019 Cervical Esophageal Phase WFL Pudding Teaspoon -- Pudding Cup -- Honey Teaspoon -- Honey Cup -- Nectar Teaspoon -- Nectar Cup -- Nectar Straw -- Thin Teaspoon -- Thin Cup -- Thin Straw -- Puree -- Mechanical Soft -- Regular -- Multi-consistency -- Pill -- Cervical Esophageal Comment -- Celedonio Savage, MA, CCC-SLP Acute Rehabilitation Services Office: [phone] 06/20/2019, 11:05 AM              Vas [place] Lower Extremity Venous (dvt)  Result Date: 06/20/2019  Lower Venous Study Indications: Pulmonary embolism.  Comparison Study: No prior study on file for comparison. Performing Technologist: Sharion Dove RVS  Examination Guidelines: A complete evaluation includes B-mode imaging, spectral Doppler, color Doppler, and power Doppler as needed of all accessible portions of each vessel. Bilateral testing is considered an integral part of a complete examination. Limited examinations for reoccurring indications may be performed as noted.  +---------+---------------+---------+-----------+----------+-------+  RIGHT     Compressibility Phasicity Spontaneity Properties Summary  +---------+---------------+---------+-----------+----------+-------+  CFV       Full            Yes       Yes                             +---------+---------------+---------+-----------+----------+-------+  SFJ       Full                                                       +---------+---------------+---------+-----------+----------+-------+  FV Prox   Full                                                      +---------+---------------+---------+-----------+----------+-------+  FV Mid    Full                                                      +---------+---------------+---------+-----------+----------+-------+  FV Distal Full                                                      +---------+---------------+---------+-----------+----------+-------+  PFV       Full                                                      +---------+---------------+---------+-----------+----------+-------+  POP       Full            Yes       Yes                             +---------+---------------+---------+-----------+----------+-------+  PTV       Full                                                      +---------+---------------+---------+-----------+----------+-------+  PERO      Full                                                      +---------+---------------+---------+-----------+----------+-------+   +---------+---------------+---------+-----------+----------+------------------+  LEFT      Compressibility Phasicity Spontaneity Properties Summary             +---------+---------------+---------+-----------+----------+------------------+  CFV       Partial                                          Mobile acute                                                                    thrombus            +---------+---------------+---------+-----------+----------+------------------+  SFJ       Full                                                                 +---------+---------------+---------+-----------+----------+------------------+  FV Prox  Full                                                                 +---------+---------------+---------+-----------+----------+------------------+  FV Mid    Full                                                                  +---------+---------------+---------+-----------+----------+------------------+  FV Distal Full                                                                 +---------+---------------+---------+-----------+----------+------------------+  PFV       Full                                                                 +---------+---------------+---------+-----------+----------+------------------+  POP       Partial         Yes       Yes                    Acute               +---------+---------------+---------+-----------+----------+------------------+  PTV       Full                                                                 +---------+---------------+---------+-----------+----------+------------------+  PERO      None                                             Acute               +---------+---------------+---------+-----------+----------+------------------+  EIV       Partial                                          mobile acute        +---------+---------------+---------+-----------+----------+------------------+  CIV                       Yes       Yes                    distal              +---------+---------------+---------+-----------+----------+------------------+  Summary: Right: There is no evidence of deep vein thrombosis in the lower extremity. Left: Findings consistent with acute deep vein thrombosis involving the left common femoral vein, left popliteal vein, and left peroneal veins. Mobile thrombus noted in the left common femoral and external iliac veins  *See table(s) above for measurements and observations.    Preliminary         Scheduled Meds:  levothyroxine  25 mcg Oral Q0600   sodium chloride flush  3 mL Intravenous Q12H   vancomycin variable dose per unstable renal function (pharmacist dosing)   Does not apply See admin instructions   Continuous Infusions:  heparin     piperacillin-tazobactam (ZOSYN)  IV 3.375 g (06/21/19 0347)     LOS: 1 day    Time spent:  26 minutes    Tesla Bochicchio J [place] DO [facility] Pager [phone]  If 7PM-7AM, please contact night-coverage [email or web address] Password [facility] 06/21/2019, 1:04 PM

## 2019-06-21 NOTE — Progress Notes (Signed)
Pt spouse contacted with update on pt status.

## 2019-06-21 NOTE — Progress Notes (Signed)
Pharmacy Antibiotic and Anticoagulation Note  Christian Wiley is a 84 y.o. male admitted on 06/19/2019 with sepsis and pulmonary embolus.  Pharmacy has been consulted for vancomycin, Zosyn, and heparin dosing. Patient's serum creatinine increased from 1.42 to 3.31 on 7/13.   Anticoag: PE >> heparin gtt. - CTA chest PE studyshowed an acute PE involving the distal right main pulmonary artery with extension into the right lobar artery and upper and lower lobe segmental and subsegmental branch vessels. - Previously planned to transition to apixaban on 7/13 AM. Discussed with MD and held transition to given Scr inc. - HL 0.36 on 7/13 at goal. Hgb dec. Plt 125  ID: Sepsis D3 abx - WBC dec 16 >> 15.7, PCT increased 15.51 >> 22.33, afebrile CXR mild patchy bilateral lower lung opacities Possible aspiration due to dysphasia   - Vanc 1750 q36 due 7/13 and dose held due to Scr 3.31. Vanc random level 32. Transition to random level dosing. - Previous dose regimen: vanc 1750mg  q36 calculated AUC 512.7 based on SrCr 1.5   Vancomycin 7/11 >> Zosyn 7/11 >>  Bcx 7/11: ngtd Covid 7/11: negative  MRSA PCR 7/13: sent  Goals of Therapy: Heparin level 0.3-0.7 units/ml Monitor platelets by anticoagulation protocol: Yes  Plan: Hold dose of vancomycin 1750mg  IV q36h on 7/13 due to elevated serum creatinine. Check vancomycin random level 7/14 at 0500. Continue Zosyn 3.375g IV q8h. Continue heparin 1200 units/hr Check heparin level 7/14 AM and CBC daily.    Height: 6' (182.9 cm) Weight: 190 lb 0.6 oz (86.2 kg) IBW/kg (Calculated) : 77.6  Temp (24hrs), Avg:98.1 F (36.7 C), Min:97.8 F (36.6 C), Max:98.7 F (37.1 C)  Recent Labs  Lab 06/19/19 2225 06/20/19 0040 06/20/19 0353 06/21/19 0344 06/21/19 0902  WBC 14.4*  --  16.0* 15.7*  --   CREATININE 1.50*  --  1.42* 2.87* 3.31*  LATICACIDVEN 4.1* 1.6  --   --   --   VANCORANDOM  --   --   --   --  32    Estimated Creatinine Clearance:  18.9 mL/min (A) (by C-G formula based on SCr of 3.31 mg/dL (H)).    Allergies  Allergen Reactions  . Ambien [Zolpidem Tartrate] Other (See Comments)    Caused anxiety and confusion    Cristela Felt, PharmD PGY1 Pharmacy Resident [employer]: [phone]

## 2019-06-21 NOTE — Progress Notes (Signed)
Nurse tech reported that pt began coughing while she was assisting him with breakfast.  She stopped feeding him at that time.  Upon my assessment he was still coughing, probably about 20 minutes after breakfast discontinued.  Weak cough, crackles noted in bilateral lung bases.  MD notified, received order for repeat SLP eval.

## 2019-06-21 NOTE — Evaluation (Signed)
Physical Therapy Evaluation Patient Details Name: Christian Wiley MRN: [id number] DOB: 11-Aug-1935 Today's Date: 06/21/2019   History of Present Illness  Christian Wiley is a 84 y.o. male with medical history significant for vascular dementia, Parkinson's disease, hypothyroidism, GERD, hyperlipidemia, BPH, RBBB, history of PE not on anticoagulation, AAA status post endovascular repair, depression who presents to the ED for evaluation of altered mental status after a fall.   Pt with sepsis with PNA, Bil PE,  and LLE DVT.   Clinical Impression  Pt admitted with above diagnosis. Pt currently with functional limitations due to the deficits listed below (see PT Problem List). Pt was able to sit EOB for 5 min with min guard assist. Could not stand with mod assist of 1.  Pt coughing throughout evaluation. Notified nursing who placed pt on 2LO2 due to desat to 87%.  Nursing also contacted MD who ordered repeat swallow test.  Will follow acutetly and progress as pt able.   Pt will benefit from skilled PT to increase their independence and safety with mobility to allow discharge to the venue listed below.      Follow Up Recommendations SNF;Supervision/Assistance - 24 hour    Equipment Recommendations  Other (comment)(TBA)    Recommendations for Other Services       Precautions / Restrictions Precautions Precautions: Fall Restrictions Weight Bearing Restrictions: No      Mobility  Bed Mobility Overal bed mobility: Needs Assistance Bed Mobility: Rolling;Sidelying to Sit;Sit to Supine Rolling: Mod assist Sidelying to sit: Mod assist   Sit to supine: Mod assist   General bed mobility comments: Pt needed mod asist to move LEs off bed and for elevation of trunk.  Also mod assist to get pt back in bed.   Transfers Overall transfer level: Needs assistance Equipment used: 1 person hand held assist Transfers: Sit to/from Stand Sit to Stand: Mod assist;From elevated surface         General  transfer comment: Pt unable to stand with +1 mod assist.  Pt able to clear buttocks but could not stand fully upright.  Several attempts made. At end of treatment, desat to 87-89% on RA.  Nurse placed pt on 2LO2 and sats 93-94%.   Ambulation/Gait                Stairs            Wheelchair Mobility    Modified Rankin (Stroke Patients Only)       Balance Overall balance assessment: Needs assistance Sitting-balance support: No upper extremity supported;Feet supported Sitting balance-Leahy Scale: Fair Sitting balance - Comments: Able to sit EOB with min guard assist.   Standing balance support: During functional activity;Single extremity supported Standing balance-Leahy Scale: Poor Standing balance comment: unable to stand fully upright with +1 mod assist.                              Pertinent Vitals/Pain Pain Assessment: No/denies pain    Home Living Family/patient expects to be discharged to:: Private residence Living Arrangements: Spouse/significant other Available Help at Discharge: Family;Available 24 hours/day Type of Home: House           Additional Comments: Pt unable to answer questions due to confusion.  Unable to obtain info about home situation due to confusion.     Prior Function Level of Independence: Independent         Comments: Pt states he was independent PTA.  Unsure if he used a device.       Hand Dominance        Extremity/Trunk Assessment   Upper Extremity Assessment Upper Extremity Assessment: Defer to OT evaluation    Lower Extremity Assessment Lower Extremity Assessment: Generalized weakness    Cervical / Trunk Assessment Cervical / Trunk Assessment: Normal  Communication   Communication: Other (comment)(Difficult to understand pt as he was coughing throughout eva)  Cognition Arousal/Alertness: Awake/alert Behavior During Therapy: WFL for tasks assessed/performed Overall Cognitive Status: History of  cognitive impairments - at baseline Area of Impairment: Orientation;Following commands;Safety/judgement;Awareness;Problem solving                 Orientation Level: Disoriented to;Time;Situation(Able to state [place])     Following Commands: Follows one step commands inconsistently;Follows one step commands with increased time Safety/Judgement: Decreased awareness of safety;Decreased awareness of deficits   Problem Solving: Slow processing;Difficulty sequencing;Requires verbal cues;Requires tactile cues;Decreased initiation General Comments: Dementia per chart      General Comments      Exercises     Assessment/Plan    PT Assessment Patient needs continued PT services  PT Problem List Decreased activity tolerance;Decreased balance;Decreased mobility;Decreased knowledge of use of DME;Decreased safety awareness;Decreased knowledge of precautions;Cardiopulmonary status limiting activity;Decreased cognition       PT Treatment Interventions DME instruction;Gait training;Functional mobility training;Therapeutic activities;Therapeutic exercise;Balance training;Patient/family education    PT Goals (Current goals can be found in the Care Plan section)  Acute Rehab PT Goals Patient Stated Goal: unable to state PT Goal Formulation: With patient Time For Goal Achievement: 07/05/19 Potential to Achieve Goals: Good    Frequency Min 3X/week   Barriers to discharge Decreased caregiver support      Co-evaluation               AM-PAC PT "6 Clicks" Mobility  Outcome Measure Help needed turning from your back to your side while in a flat bed without using bedrails?: A Lot Help needed moving from lying on your back to sitting on the side of a flat bed without using bedrails?: A Lot Help needed moving to and from a bed to a chair (including a wheelchair)?: Total Help needed standing up from a chair using your arms (e.g., wheelchair or bedside chair)?: A Lot Help needed to  walk in hospital room?: Total Help needed climbing 3-5 steps with a railing? : Total 6 Click Score: 9    End of Session Equipment Utilized During Treatment: Gait belt;Oxygen Activity Tolerance: Patient limited by fatigue(limited by incr cough and desat, nurse made aware) Patient left: in bed;with call bell/phone within reach;with bed alarm set Nurse Communication: Mobility status(nurse made aware of pt coughing constantly and desat ) PT Visit Diagnosis: Unsteadiness on feet (R26.81);Muscle weakness (generalized) (M62.81)    Time: 9629-5284 PT Time Calculation (min) (ACUTE ONLY): 16 min   Charges:   PT Evaluation $PT Eval Moderate Complexity: [place] Pager:  [phone]  Office:  [phone]    Denice Paradise 06/21/2019, 9:57 AM

## 2019-06-21 NOTE — Progress Notes (Signed)
  Speech Language Pathology Treatment: Dysphagia  Patient Details Name: Christian Wiley MRN: [id number] DOB: 1934-12-24 Today's Date: 06/21/2019 Time: 1500-1530 SLP Time Calculation (min) (ACUTE ONLY): 30 min  Assessment / Plan / Recommendation Clinical Impression  Patient seen to address dysphagia goals with SLP providing education to patient's spouse who was in room. SLP reviewed MBS films with patient's spouse, described and demonstrated s/s of aspiration, discussed safe PO intake strategies (she was already implementing majority of them at home prior to this admission), discussed caregiver support and recommendation to seek out [facility] aide if patient discharges home, and discussed progressive nature of Parkinson's on swallow function. She verbalized understanding. Patient observed with PO's of thin liquids via straw sips and although he exhibited delayed swallow initiation and multiple swallows, no overt s/s of aspiration. As per MBS, he did not exhibit any penetration or aspiration, but did exhibit swallow initiation delay and mild pharyngeal residuals with cleared with subsequent swallows.  Swallow evaluation was reordered today secondary to reported patient coughing with breakfast, however as he had MBS on 7/12, a new BSE is not warranted at this time. Of note, SLP who performed MBS reported that patient was coughing in the absence of penetration or aspiration during testing.     HPI HPI: Christian Wiley is a 83 y.o. male with medical history significant for vascular dementia, Parkinson's disease, hypothyroidism, GERD, hyperlipidemia, BPH, RBBB, history of PE not on anticoagulation, AAA status post endovascular repair, depression who presents to the ED for evaluation of altered mental status after a fall.  Pt is nonverbal at baseline. Head CT 7/12 with no acute findings.  CXR 7/11 with BLL opacities concerning for pneumonia.  Per chart review, pt has hx of aspiration as reported by wife, but there  is no further information available regarding baseline swallow function.      SLP Plan  Continue with current plan of care       Recommendations  Diet recommendations: Dysphagia 3 (mechanical soft) Liquids provided via: Cup;Straw Medication Administration: Whole meds with liquid Supervision: Full supervision/cueing for compensatory strategies;Trained caregiver to feed patient Compensations: Minimize environmental distractions;Slow rate;Small sips/bites Postural Changes and/or Swallow Maneuvers: Seated upright 90 degrees                Oral Care Recommendations: Oral care BID Follow up Recommendations: Skilled Nursing facility;24 hour supervision/assistance;Home health SLP SLP Visit Diagnosis: Dysphagia, oropharyngeal phase (R13.12) Plan: Continue with current plan of care       GO                Dannial Monarch 06/21/2019, 5:28 PM  Sonia Baller, MA, [place] [facility] Pager: [phone]

## 2019-06-21 NOTE — Progress Notes (Signed)
ANTICOAGULATION CONSULT NOTE - Initial Consult  Pharmacy Consult for apixaban Indication: PE/DVT  Allergies  Allergen Reactions  . Ambien [Zolpidem Tartrate] Other (See Comments)    Caused anxiety and confusion    Patient Measurements: Height: 6' (182.9 cm) Weight: 190 lb 0.6 oz (86.2 kg) IBW/kg (Calculated) : 77.6  Vital Signs: Temp: 98.7 F (37.1 C) (07/13 0350) Temp Source: Oral (07/13 0350) BP: 124/92 (07/13 0004) Pulse Rate: 69 (07/13 0004)  Labs: Recent Labs    06/19/19 2225 06/20/19 0040 06/20/19 0353 06/20/19 1110 06/20/19 1932 06/21/19 0344  HGB 13.6  --  12.0*  --   --  11.3*  HCT 43.1  --  36.4*  --   --  33.9*  PLT 157  --  121*  --   --  125*  APTT 24  --  161*  --   --   --   LABPROT 14.5  --  17.4*  --   --   --   INR 1.1  --  1.4*  --   --   --   HEPARINUNFRC  --   --   --  0.91* 0.57 0.36  CREATININE 1.50*  --  1.42*  --   --  2.87*  TROPONINIHS 11 19*  --   --   --   --     Estimated Creatinine Clearance: 21.8 mL/min (A) (by C-G formula based on SCr of 2.87 mg/dL (H)).   Medical History: Past Medical History:  Diagnosis Date  . Abdominal aortic aneurysm, without rupture ([place])   . HTN (hypertension)   . Parkinson disease ([place])   . Vascular dementia ([place])     Medications:  Medications Prior to Admission  Medication Sig Dispense Refill Last Dose  . allopurinol (ZYLOPRIM) 300 MG tablet Take 300 mg by mouth daily with breakfast.   06/19/2019 at am  . ALPRAZolam (XANAX) 0.5 MG tablet Take 0.5 mg by mouth daily as needed for anxiety.   1-2 weeks ago  . atorvastatin (LIPITOR) 40 MG tablet Take 40 mg by mouth at bedtime.   06/18/2019 at pm  . cefdinir (OMNICEF) 300 MG capsule Take 300 mg by mouth 2 (two) times daily.   06/19/2019 at supper time  . escitalopram (LEXAPRO) 10 MG tablet Take 10 mg by mouth daily with breakfast.   06/19/2019 at am  . finasteride (PROSCAR) 5 MG tablet Take 5 mg by mouth daily with breakfast.   06/19/2019 at am  .  levothyroxine (SYNTHROID) 25 MCG tablet Take 25 mcg by mouth daily before breakfast.   06/19/2019 at am  . nebivolol (BYSTOLIC) 5 MG tablet Take 5 mg by mouth daily with breakfast.   06/19/2019 at 1000  . Pimavanserin Tartrate (NUPLAZID) 34 MG CAPS Take 34 mg by mouth at bedtime.   06/18/2019 at pm  . omeprazole (PRILOSEC) 40 MG capsule Take 40 mg by mouth See admin instructions. Take one capsule (40 mg) by mouth every Monday, Wednesday, Friday nights   week ago   Scheduled:  . levothyroxine  25 mcg Oral Q0600  . sodium chloride flush  3 mL Intravenous Q12H   Infusions:  . heparin 1,200 Units/hr (06/20/19 2029)  . piperacillin-tazobactam (ZOSYN)  IV 3.375 g (06/21/19 0347)  . sodium chloride    . vancomycin      Assessment: 84yo male with confirmed PE and DVT to transition from UFH to apixaban.  Plan:  Will start apixaban 10mg  PO BID x7d then 5mg  PO BID; will  stop heparin gtt at time of first dose.  Start anticoagulation education.  Wynona Neat, PharmD, BCPS  06/21/2019,7:32 AM

## 2019-06-21 NOTE — Progress Notes (Signed)
Consulted with [facility] and [facility] ref to pt's wife being able to visit. Christian Wiley is able to visit, must follow all protocols. Jerald Kief, RN

## 2019-06-21 NOTE — Progress Notes (Signed)
NAME:  Christian Wiley, MRN:  [id number], DOB:  [birth year], LOS: 1 ADMISSION DATE:  06/19/2019, CONSULTATION DATE:  06/20/2019 REFERRING MD:  Zada Finders CHIEF COMPLAINT:  SOB/PE  Brief History   84yo M PMH vascular dementia-minimally verbally communicative, alert to person only at baseline, Parkinson's disease, hypothyroidism, GERD, hyperlipidemia, BPH, RBBB, history of PE not on anticoagulation, AAA status post endovascular repair, depression admitted for acute PE involving the distal right main pulmonary artery with extension into the right lobar artery and upper and lower lobe segmental and subsegmental branch vessels.  A small left lower lobe subsegmental PE was also seen.  CT findings were positive for evidence of right heart strain consistent with at least submassive PE.   Past Medical History  Vascular dementia Parkinson's disease Hypothyroidism GERD Hyperlipidemia BPH  right bundle branch block  history of PE not on anticoagulation AAA status post endovascular repair   depression  Significant Hospital Events   7/12 Admitted  Consults:  PCCM  Procedures:     Significant Diagnostic Tests:  CT of the head 7/12 >>  negative for acute intracranial abnormality.  Atrophy and mild small vessel ischemic changes of white matter was noted.  CTA Chest 7/12 >> showed acute PE involving the distal right main pulmonary artery with extension into the right lobar artery and upper and lower lobe segmental and subsegmental branch vessels.  A small left lower lobe subsegmental PE was also seen.  CT findings were positive for evidence of right heart strain consistent with at least submassive PE.  Changes suggestive of multifocal pneumonia worse in the bilateral lower lobes was also seen.  LE Venous Duplex 7/12 >> acute DVT of the left common femoral vein, left popliteal vein, and left peroneal veins, mobile thrombus in left common femoral & external iliac veins   ECHO 7/12 >> LVEF 60-65%,  RA normal in size, RV with normal systolic function   Micro Data:  SARS Coronavirus 2 7/12 >> negative Urine 7/12 >> negative  Blood 7/12 >>  Antimicrobials:  Cefdinir (outpatient, was on D6/7 on day of admit) Pip-tazo 7/11 >> Vancomycin 7/11 >> 7/13  Interim history/subjective:  Pt reports ongoing cough, difficultly coughing "it up".    Objective   Blood pressure (!) 141/72, pulse 70, temperature 97.9 F (36.6 C), temperature source Oral, resp. rate (!) 30, height 6' (1.829 m), weight 86.2 kg, SpO2 95 %.        Intake/Output Summary (Last 24 hours) at 06/21/2019 1030 Last data filed at 06/21/2019 0449 Gross per 24 hour  Intake 438.86 ml  Output 225 ml  Net 213.86 ml   Filed Weights   06/20/19 0310 06/20/19 0555 06/21/19 0350  Weight: 85.4 kg 85.5 kg 86.2 kg    Examination: General: elderly male lying in bed in NAD HEENT: MM pink/moist, moist repetitive cough  Neuro: Awake, alert, oriented to self, place  CV: s1s2 rrr, no m/r/g PULM: even/non-labored, lungs bilaterally with rhonchi  LJ:QGBE, non-tender, bsx4 active  Extremities: warm/dry, no edema  Skin: no rashes or lesions  Resolved Hospital Problem list      Assessment & Plan:   Submassive PE with Hypoxia and RV strain -on heparin gtt -prior cessation of anticoagulation due to AAA endo leak  DVT LLE  -clot up to common femoral with mobile clot in femoral and external iliac veins P: Heparin gtt per pharmacy  Will need life long anticoagulation, wife opted for continuation  Transition to [place] when clinically stable  DNR /  DNI  Agree with Palliative Care input  Continue O2 to support sats >90% Discussed LE clot with attending MD, hold on IVC filter    Best practice:  Diet: NPO  Pain/Anxiety/Delirium protocol (if indicated): prn VAP protocol (if indicated): not indicated DVT prophylaxis: therapeutic heparin GI prophylaxis: per primary Glucose control: per primary  Mobility: bed rest Code Status:  DNR/DNI Family Communication:per primary team Disposition: 4E  PCCM will be available PRN.  Please call back if new needs arise.   Labs   CBC: Recent Labs  Lab 06/19/19 2225 06/20/19 0353 06/21/19 0344  WBC 14.4* 16.0* 15.7*  NEUTROABS 9.1*  --   --   HGB 13.6 12.0* 11.3*  HCT 43.1 36.4* 33.9*  MCV 97.1 93.1 91.9  PLT 157 121* 125*    Basic Metabolic Panel: Recent Labs  Lab 06/19/19 2225 06/20/19 0353 06/21/19 0344 06/21/19 0902  NA 130* 135 135  --   K 4.5 3.6 3.9  --   CL 98 104 103  --   CO2 20* 22 18*  --   GLUCOSE 150* 136* 115*  --   BUN 28* 25* 42*  --   CREATININE 1.50* 1.42* 2.87* 3.31*  CALCIUM 8.6* 8.1* 8.5*  --   MG  --   --  1.7  --    GFR: Estimated Creatinine Clearance: 18.9 mL/min (A) (by C-G formula based on SCr of 3.31 mg/dL (H)). Recent Labs  Lab 06/19/19 2225 06/20/19 0040 06/20/19 0353 06/21/19 0344  PROCALCITON  --   --  15.51 22.33  WBC 14.4*  --  16.0* 15.7*  LATICACIDVEN 4.1* 1.6  --   --     Liver Function Tests: Recent Labs  Lab 06/19/19 2225  AST 24  ALT 29  ALKPHOS 96  BILITOT 0.9  PROT 5.8*  ALBUMIN 3.1*   No results for input(s): LIPASE, AMYLASE in the last 168 hours. No results for input(s): AMMONIA in the last 168 hours.  ABG No results found for: PHART, PCO2ART, PO2ART, HCO3, TCO2, ACIDBASEDEF, O2SAT   Coagulation Profile: Recent Labs  Lab 06/19/19 2225 06/20/19 0353  INR 1.1 1.4*    Cardiac Enzymes: No results for input(s): CKTOTAL, CKMB, CKMBINDEX, TROPONINI in the last 168 hours.  HbA1C: No results found for: HGBA1C  CBG: No results for input(s): GLUCAP in the last 168 hours.   Noe Gens, NP-C [facility] Pgr: [phone] or if no answer [phone] 06/21/2019, 10:30 AM

## 2019-06-22 ENCOUNTER — Inpatient Hospital Stay (HOSPITAL_COMMUNITY): Payer: PPO

## 2019-06-22 DIAGNOSIS — R6520 Severe sepsis without septic shock: Secondary | ICD-10-CM

## 2019-06-22 DIAGNOSIS — N179 Acute kidney failure, unspecified: Secondary | ICD-10-CM

## 2019-06-22 LAB — CBC
HCT: 33.5 % — ABNORMAL LOW (ref 39.0–52.0)
Hemoglobin: 11.1 g/dL — ABNORMAL LOW (ref 13.0–17.0)
MCH: 30.7 pg (ref 26.0–34.0)
MCHC: 33.1 g/dL (ref 30.0–36.0)
MCV: 92.8 fL (ref 80.0–100.0)
Platelets: 128 10*3/uL — ABNORMAL LOW (ref 150–400)
RBC: 3.61 MIL/uL — ABNORMAL LOW (ref 4.22–5.81)
RDW: 14.6 % (ref 11.5–15.5)
WBC: 13.4 10*3/uL — ABNORMAL HIGH (ref 4.0–10.5)
nRBC: 0 % (ref 0.0–0.2)

## 2019-06-22 LAB — BASIC METABOLIC PANEL
Anion gap: 11 (ref 5–15)
BUN: 48 mg/dL — ABNORMAL HIGH (ref 8–23)
CO2: 18 mmol/L — ABNORMAL LOW (ref 22–32)
Calcium: 8.2 mg/dL — ABNORMAL LOW (ref 8.9–10.3)
Chloride: 107 mmol/L (ref 98–111)
Creatinine, Ser: 4.28 mg/dL — ABNORMAL HIGH (ref 0.61–1.24)
GFR calc Af Amer: 14 mL/min — ABNORMAL LOW (ref >60)
GFR calc non Af Amer: 12 mL/min — ABNORMAL LOW (ref >60)
Glucose, Bld: 108 mg/dL — ABNORMAL HIGH (ref 70–99)
Potassium: 3.9 mmol/L (ref 3.5–5.1)
Sodium: 136 mmol/L (ref 135–145)

## 2019-06-22 LAB — PROCALCITONIN: Procalcitonin: 8.55 ng/mL

## 2019-06-22 LAB — APTT: aPTT: 79 seconds — ABNORMAL HIGH (ref 24–36)

## 2019-06-22 LAB — HEPARIN LEVEL (UNFRACTIONATED): Heparin Unfractionated: 2.1 IU/mL — ABNORMAL HIGH (ref 0.30–0.70)

## 2019-06-22 NOTE — Progress Notes (Signed)
ANTICOAGULATION CONSULT NOTE - Follow Up Consult  Pharmacy Consult for heparin Indication: pulmonary embolus  Allergies  Allergen Reactions  . Ambien [Zolpidem Tartrate] Other (See Comments)    Caused anxiety and confusion    Patient Measurements: Height: 6' (182.9 cm) Weight: 193 lb 12.6 oz (87.9 kg) IBW/kg (Calculated) : 77.6 Heparin Dosing Weight: 85.4 kg   Vital Signs: Temp: 97.9 F (36.6 C) (07/14 0838) Temp Source: Oral (07/14 0838) BP: 170/72 (07/14 0854) Pulse Rate: 74 (07/14 0900)  Labs: Recent Labs    06/19/19 2225 06/20/19 0040 06/20/19 0353  06/20/19 1932 06/21/19 0344 06/21/19 0902 06/22/19 0548 06/22/19 0845  HGB 13.6  --  12.0*  --   --  11.3*  --  11.1*  --   HCT 43.1  --  36.4*  --   --  33.9*  --  33.5*  --   PLT 157  --  121*  --   --  125*  --  128*  --   APTT 24  --  161*  --   --   --   --   --  79*  LABPROT 14.5  --  17.4*  --   --   --   --   --   --   INR 1.1  --  1.4*  --   --   --   --   --   --   HEPARINUNFRC  --   --   --    < > 0.57 0.36  --  2.10*  --   CREATININE 1.50*  --  1.42*  --   --  2.87* 3.31* 4.28*  --   TROPONINIHS 11 19*  --   --   --   --   --   --   --    < > = values in this interval not displayed.    Estimated Creatinine Clearance: 14.6 mL/min (A) (by C-G formula based on SCr of 4.28 mg/dL (H)).   Assessment: 84 yo male admitted 06/19/2019 with sepsis/PNA and pulmonary embolus. On 7/14 on heparin 1200 units/hr, heparin level of 2.1 was elevated due to one dose of apixaban administered on 7/13. APTT 79 is at goal. Hgb slightly decreased from 13.6 on 7/11 to 11.1 on 7/14. Platelets low. Scr increased from 1.42 to 4.28.  Goal of Therapy:  Heparin level 0.3-0.7 units/ml  APTT goal 66-102 sec Monitor platelets by anticoagulation protocol: Yes   Plan:  Continue heparin 1200 units/hr Check heparin level, aPTT, and CBC daily Monitor for S&S of bleeding  Monitor serum creatinine    Cristela Felt, PharmD PGY1 Pharmacy  Resident [employer]: [phone]   06/22/2019,11:13 AM

## 2019-06-22 NOTE — Plan of Care (Signed)
  Problem: Nutrition: Goal: Adequate nutrition will be maintained Outcome: Not Progressing   

## 2019-06-22 NOTE — Progress Notes (Signed)
  Speech Language Pathology Treatment: Dysphagia  Patient Details Name: Christian Wiley MRN: [id number] DOB: September 22, 1935 Today's Date: 06/22/2019 Time: 1151-1202 SLP Time Calculation (min) (ACUTE ONLY): 11 min  Assessment / Plan / Recommendation Clinical Impression  Pt has oral preparation that appears to be consistent with MBS, with munching-like mastication pattern that takes additional time. Pt uses lingual sweeps with Mod I and SLP offered liquid washes throughout intake. Agree that mechanical soft is appropriate to accommodate oral preparation. He had a single cough after multiple consecutive straw sips, but this was not replicated across additional trials. Per MBS, this is not necessarily indicative of decreased airway protection. Would continue current diet and precautions.   HPI HPI: Christian Wiley is a 84 y.o. male with medical history significant for vascular dementia, Parkinson's disease, hypothyroidism, GERD, hyperlipidemia, BPH, RBBB, history of PE not on anticoagulation, AAA status post endovascular repair, depression who presents to the ED for evaluation of altered mental status after a fall.  Pt is nonverbal at baseline. Head CT 7/12 with no acute findings.  CXR 7/11 with BLL opacities concerning for pneumonia.  Per chart review, pt has hx of aspiration as reported by wife, but there is no further information available regarding baseline swallow function.      SLP Plan  Continue with current plan of care       Recommendations  Diet recommendations: Dysphagia 3 (mechanical soft);Thin liquid Liquids provided via: Cup;Straw Medication Administration: Whole meds with liquid Supervision: Full supervision/cueing for compensatory strategies;Trained caregiver to feed patient Compensations: Minimize environmental distractions;Slow rate;Small sips/bites Postural Changes and/or Swallow Maneuvers: Seated upright 90 degrees                Oral Care Recommendations: Oral care  BID Follow up Recommendations: Skilled Nursing facility;24 hour supervision/assistance;Home health SLP SLP Visit Diagnosis: Dysphagia, oropharyngeal phase (R13.12) Plan: Continue with current plan of care       GO                Venita Sheffield Lc Joynt 06/22/2019, 12:57 PM   Pollyann Glen, M.A. [place] Acute Environmental education officer [phone] Office [phone]

## 2019-06-22 NOTE — Progress Notes (Signed)
Palliative:  HPI: 84 y.o.malewith past medical history of vascular dementia, Parkinson's disease, hypothyroidism, GERD, HLD, BPH, h/o PE2011 and 2014(no anticoagulation s/t type II endoleak), AAA s/p endovascular repair, depressionadmitted on 7/11/2020from homewithAMS and fall and found to have submassive saddle PE with right heart strain as well as bilateral multifocal pneumonia with concern from family about aspiration at home. He continue 06/21/19 with coughing with any intake but with renal decline. 06/22/19 cough improved with careful, slow hand feeding and renal function hopefully s/t to urinary retention and foley placed.   I met today at Mr. Sasaki's bedside. He is sleeping comfortably. He remains stable but creatinine continues to climb. RN reports that they have placed foley catheter as he appears to be retaining urine. Hopefully this will be the answer to his declining renal function.   I spoke with wife, [name], and she is anxious about his renal function. We discussed that the foley catheter could be the answer to this problem and I would not worry about this until we recheck his renal function tomorrow. She further tells me that she spoke with [place]. [place] yesterday about sending her husband to SNF rehab but she has changed her mind. She has decided that she would like to take him home as she recognizes that he will likely continue to decline at SNF out of his familiar environment and she believes home will be best for him. I agree with her but want to ensure that she has the support to help her at home. She tells me that she would like for hospice to help them at home as she feels that they will best help them maintain his QOL given his underlying dementia and Parkinson's disease - I feel this would be an appropriate plan. [name] has no further questions/concerns. Emotional support provided.   Updated Dr. [place]   Exam: Sleeping (I did not awaken). Breathing regular and unlabored on  room air. Abd flat. No distress. No edema.   Plan: - Optimize medically and then proceed with return home with hospice care to follow.  - Coughing has improved with giving very small bites and taking time with careful hand feeding.  - Will continue to monitor renal function.   25 min  Vinie Sill, NP Palliative Medicine Team Pager [phone] (Please see [email or web address] for schedule) Team Phone [phone]    Greater than 50%  of this time was spent counseling and coordinating care related to the above assessment and plan   The above conversation was completed via telephone due to the visitor restrictions during the COVID-19 pandemic. Thorough chart review and discussion with necessary members of the care team was completed as part of assessment.

## 2019-06-22 NOTE — Progress Notes (Signed)
PROGRESS NOTE    Christian Wiley  WER:[id number] DOB: 1935/06/18 DOA: 06/19/2019 PCP: [name], MD    Brief Narrative:   Christian Wiley is a 84 y.o. male with medical history significant for vascular dementia, Parkinson's disease, hypothyroidism, GERD, hyperlipidemia, BPH, RBBB, history of PE not on anticoagulation, AAA status post endovascular repair, depression who presents to the ED for evaluation of altered mental status after a fall.  Patient is unable to provide any history therefore entirety of history is obtained from EDP, chart review, and wife by phone.  Wife states that patient at baseline is nonverbal and only occasionally alert.  He needs help with all of his ADLs but can walk with the use of a walker.  She states that he seems to be choking every time he eats. She says that he has been treated for UTI with cefdinir completing day 6 of 7 so far.  She says today while walking with his walker he began to fall forwards.  Luckily his wife was by and was unable to catch him and slowly sliding down to the ground.  She says he did have a small abrasion to his elbow but did not hit his head.  She called EMS and per EDP patient was noted to be hypoxic requiring nonrebreather and was brought to the ED for further evaluation.  ED Course: Initial vitals showed BP 133/55, pulse 88, RR 30, temp 101.8 Fahrenheit rectally, SPO2 97% on 4 L supplemental O2 via [place].  Labs are notable for WBC 14.4, hemoglobin 13.6, platelets 157,000, sodium 130, potassium 4.5, bicarb 20, BUN 28, creatinine 1.5, serum glucose 150, high-sensitivity troponin IL-11, lactic acid 4.1, SARS-CoV-2 test negative.  Urinalysis showed negative nitrites, trace leukocytes, 21-50 RBC/hpf, 0-5 WBC /hpf, rare bacteria on microscopy.  Blood cultures were drawn and pending.  Portable chest x-ray showed mild patchy bilateral lower lung opacities.  CT head without contrast was negative for acute intracranial abnormality.   Atrophy and mild small vessel ischemic changes of the white matter was noted.  CTA chest PE study showed an acute PE involving the distal right main pulmonary artery with extension into the right lobar artery and upper and lower lobe segmental and subsegmental branch vessels.  A small left lower lobe subsegmental PE was also seen.  CT findings were positive for evidence of right heart strain consistent with at least submassive PE.  Changes suggestive of multifocal pneumonia worse in the bilateral lower lobes was also seen.  Patient was given 2.6 L normal saline and started on IV vancomycin and Zosyn.  PCCM were consulted who recommended hospitalist admission.  The hospitalist service was consulted to admit for further evaluation and management.  Assessment & Plan:   Principal Problem:   Sepsis due to pneumonia [facility]) Active Problems:   Parkinson disease ([place])   Vascular dementia ([place])   Hypothyroidism   History of pulmonary embolus (PE)   Depression with anxiety   Hyperlipidemia   BPH (benign prostatic hyperplasia)   AKI (acute kidney injury) ([place])   Acute pulmonary embolism (HCC)   Goals of care, counseling/discussion   Palliative care encounter   Submassive bilateral pulmonary embolism with right heart strain Acute DVT left common femoral vein, left popliteal vein, left peroneal vein With CT evidence of right heart strain.  Patient has a history of PE and per wife was on Xarelto until  1 year ago which was discontinued due to a type II endoleak found on angiography seemingly resolution of the  leak after holding anticoagulation.  Vascular ultrasound duplex bilateral lower extremities notable for acute DVT on left.  Transthoracic echocardiogram with EF 54-65%, diastolic dysfunction, no mention of heart strain. --PCCM following, appreciate assistance --Continue IV heparin drip; will await transition to Eliquis until renal function improves --Continue supplemental oxygen; titrate for  SPO2 greater than 92%  Pneumonia, multifocal; possible aspiration Wife reports behavior suggestive of aspiration.  CT PA notable for multifocal pneumonia.  Procalcitonin elevated at 15.50 with a lactic acid of 1.6.  COVID-19 test negative. --WBC 14.4-->16.0-->15.7-->13.4 --Blood cultures x 2: NG x 2 days --MRSA PCR negative --vancomycin/Zosyn changed to azithromycin/ceftriaxone 7/13 --Dysphagia diet per speech therapy --Daily CBC  Dysphasia with possible aspiration: Discussed with patient's spouse, [name] today regarding his issues on swallowing.  She has noticed that he has significant cough while eating that has been present over the past 2 weeks.  Concern for possible aspiration given pneumonia as above.  Discussed with her that if he is unable to eat or drink orally, that he has advanced directives that would decline use of artificial means with tube feeding. --Speech therapy following, no signs of aspiration on MBS S yesterday --Dysphagia diet --Aspiration precautions  Acute kidney injury: Creatinine 1.50 on admission. Likely prerenal, received 2.6 L normal saline in the ED.   --Creatinine 1.50-->1.42-->2.87-->3.31-->4.28 --bladder scan with >685 retained urine 7/14; distended abd --foley cath placed with >1540mL urine out --check renal u/s --Avoid nephrotoxins, renally dose all medications --Daily BMP  Hyponatremia: Likely from hypovolemia, received 2.6 L normal saline bolus in ED. --Na 130-->135-->135-->136 --gentle IVF hydration --Continue to monitor daily BMP  Hyperlipidemia: --continue home atorvastatin 40 mg daily  Hypothyroidism: --Continue Synthroid 25 mcg p.o. daily  Depression: --Lexapro 10 mg p.o. daily  Vascular Parkinsonism and dementia with behavioral disturbance Failure to thrive/progressive functional decline Patient with advanced vascular parkinsonism and dementia, now essentially nonverbal and unable to perform ADLs per wife.  Is able to ambulate  some with the use of a walker otherwise quality of life is poor.  I do long discussion with his wife by phone who states that he has previously told his wife and daughter that in the event of cardiac arrest or significant respiratory distress he would not want to be resuscitated or have artificial means of nutrition. --Continue Nuplazid 34 mg daily --Palliative care consult for assistance with further medical decision making/goals of care and family support    DVT prophylaxis: Heparin drip; plan to transition to Eliquis when renal function improves Code Status: DNR Family Communication: none Disposition Plan: To be determined   Consultants:   Palliative care  Procedures:  VAS Bilateral LE Duplex Ultrasound Summary: Right: There is no evidence of deep vein thrombosis in the lower extremity. Left: Findings consistent with acute deep vein thrombosis involving the left common femoral vein, left popliteal vein, and left peroneal veins. Mobile thrombus noted in the left common femoral and external iliac veins  Transthoracic echocardiogram: 7/12 IMPRESSIONS   1. The left ventricle has normal systolic function with an ejection fraction of 60-65%. The cavity size was normal. Left ventricular diastolic Doppler parameters are consistent with impaired relaxation.  2. The right ventricle has normal systolic function. The cavity was normal. There is no increase in right ventricular wall thickness.  3. The aortic valve is tricuspid. Mild thickening of the aortic valve. Mild calcification of the aortic valve.  MBSS: Pt presents with mild oropharyngeal dysphagia c/b nonrotary mastication, decreased lingual coordination, and delayed swallow initiation.  These deficits  resulted in impaired bolus formation and delayed A-P transit, swallow initiation at the vallecula with solids, and piecemeal deglutition.  Pharyngeal residue was cleared with spontaneous subsequent swallows.  There was no penetration or  aspiration with any consistencies.  Pt was noted to cough during evaluation in absence of penetration/aspiration. Recommend regular texture diet with thin liquid.  Antimicrobials:   Vancomycin 7/11 - 7/13  Zosyn 7/11 - 7/13  Azithromycin 7/13>>  Ceftriaxone 7/13>>   Subjective:  Patient seen and examined at bedside, pleasantly confused.  Patient with small urinary output with condom catheter.  Noted abdominal distention.  Bladder scan this morning shows greater than 685 mL's retained urine.  Foley catheter placed with return of greater than 1500 mL's of urine.  Continues on heparin drip for acute PE/DVT.  Creatinine trending up, etiology likely obstructive uropathy.  Check renal ultrasound.  Awaiting improvement of creatinine to transition to NOAC.  No other complaints today from patient.  No family present at bedside.  Denies headache, no shortness of breath, no chest pain, no abdominal pain.  No other acute concerns overnight per nursing staff.  Objective: Vitals:   06/22/19 0854 06/22/19 0900 06/22/19 1100 06/22/19 1137  BP: (!) 170/72   124/62  Pulse:  74 68 62  Resp: (!) 23 (!) 30 15 (!) 24  Temp:    98.1 F (36.7 C)  TempSrc:    Oral  SpO2: 97% 99% 97% 97%  Weight:      Height:        Intake/Output Summary (Last 24 hours) at 06/22/2019 1204 Last data filed at 06/22/2019 1046 Gross per 24 hour  Intake 1322.1 ml  Output 2026 ml  Net -703.9 ml   Filed Weights   06/20/19 0555 06/21/19 0350 06/22/19 0402  Weight: 85.5 kg 86.2 kg 87.9 kg    Examination:  General exam: Appears calm and comfortable, pleasantly confused Respiratory system: Coarse breath sounds bilaterally, slightly decreased bilateral bases, no wheezing, normal respiratory effort, on 2 L nasal cannula Cardiovascular system: S1 & S2 heard, RRR. No JVD, murmurs, rubs, gallops or clicks. No pedal edema. Gastrointestinal system: Abdomen is distended in the suprapubic region, soft and nontender. No organomegaly  or masses felt. Normal bowel sounds heard. Central nervous system: Alert, pleasantly confused, no focal neurological deficits Extremities: Symmetric 5 x 5 power. Skin: No rashes, lesions or ulcers Psychiatry: Pleasantly confused, normal mood/affect    Data Reviewed: I have personally reviewed following labs and imaging studies  CBC: Recent Labs  Lab 06/19/19 2225 06/20/19 0353 06/21/19 0344 06/22/19 0548  WBC 14.4* 16.0* 15.7* 13.4*  NEUTROABS 9.1*  --   --   --   HGB 13.6 12.0* 11.3* 11.1*  HCT 43.1 36.4* 33.9* 33.5*  MCV 97.1 93.1 91.9 92.8  PLT 157 121* 125* 865*   Basic Metabolic Panel: Recent Labs  Lab 06/19/19 2225 06/20/19 0353 06/21/19 0344 06/21/19 0902 06/22/19 0548  NA 130* 135 135  --  136  K 4.5 3.6 3.9  --  3.9  CL 98 104 103  --  107  CO2 20* 22 18*  --  18*  GLUCOSE 150* 136* 115*  --  108*  BUN 28* 25* 42*  --  48*  CREATININE 1.50* 1.42* 2.87* 3.31* 4.28*  CALCIUM 8.6* 8.1* 8.5*  --  8.2*  MG  --   --  1.7  --   --    GFR: Estimated Creatinine Clearance: 14.6 mL/min (A) (by C-G formula based on SCr  of 4.28 mg/dL (H)). Liver Function Tests: Recent Labs  Lab 06/19/19 2225  AST 24  ALT 29  ALKPHOS 96  BILITOT 0.9  PROT 5.8*  ALBUMIN 3.1*   No results for input(s): LIPASE, AMYLASE in the last 168 hours. No results for input(s): AMMONIA in the last 168 hours. Coagulation Profile: Recent Labs  Lab 06/19/19 2225 06/20/19 0353  INR 1.1 1.4*   Cardiac Enzymes: No results for input(s): CKTOTAL, CKMB, CKMBINDEX, TROPONINI in the last 168 hours. BNP (last 3 results) No results for input(s): PROBNP in the last 8760 hours. HbA1C: No results for input(s): HGBA1C in the last 72 hours. CBG: No results for input(s): GLUCAP in the last 168 hours. Lipid Profile: No results for input(s): CHOL, HDL, LDLCALC, TRIG, CHOLHDL, LDLDIRECT in the last 72 hours. Thyroid Function Tests: Recent Labs    06/20/19 0353  TSH 1.781   Anemia Panel: No  results for input(s): VITAMINB12, FOLATE, FERRITIN, TIBC, IRON, RETICCTPCT in the last 72 hours. Sepsis Labs: Recent Labs  Lab 06/19/19 2225 06/20/19 0040 06/20/19 0353 06/21/19 0344 06/22/19 0548  PROCALCITON  --   --  15.51 22.33 8.55  LATICACIDVEN 4.1* 1.6  --   --   --     Recent Results (from the past 240 hour(s))  Blood culture (routine x 2)     Status: None (Preliminary result)   Collection Time: 06/19/19 10:10 PM   Specimen: BLOOD RIGHT FOREARM  Result Value Ref Range Status   Specimen Description BLOOD RIGHT FOREARM  Final   Special Requests   Final    BOTTLES DRAWN AEROBIC AND ANAEROBIC Blood Culture adequate volume   Culture   Final    NO GROWTH 2 DAYS Performed at [facility], [place] [address], [place], [place] [address]    Report Status PENDING  Incomplete  Urine culture     Status: None   Collection Time: 06/19/19 10:28 PM   Specimen: Urine, Clean Catch  Result Value Ref Range Status   Specimen Description URINE, CLEAN CATCH  Final   Special Requests NONE  Final   Culture   Final    NO GROWTH Performed at [facility], [place] [address], [place], [place] [address]    Report Status 06/21/2019 FINAL  Final  SARS Coronavirus 2 (CEPHEID- Performed in [place] [facility]), Hosp Order     Status: None   Collection Time: 06/19/19 10:28 PM   Specimen: Urine, Catheterized; Nasopharyngeal  Result Value Ref Range Status   SARS Coronavirus 2 NEGATIVE NEGATIVE Final    Comment: (NOTE) If result is NEGATIVE SARS-CoV-2 target nucleic acids are NOT DETECTED. The SARS-CoV-2 RNA is generally detectable in upper and lower  respiratory specimens during the acute phase of infection. The lowest  concentration of SARS-CoV-2 viral copies this assay can detect is 250  copies / mL. A negative result does not preclude SARS-CoV-2 infection  and should not be used as the sole basis for treatment or other  patient management decisions.  A negative result may occur with    improper specimen collection / handling, submission of specimen other  than nasopharyngeal swab, presence of viral mutation(s) within the  areas targeted by this assay, and inadequate number of viral copies  (<250 copies / mL). A negative result must be combined with clinical  observations, patient history, and epidemiological information. If result is POSITIVE SARS-CoV-2 target nucleic acids are DETECTED. The SARS-CoV-2 RNA is generally detectable in upper and lower  respiratory specimens dur ing the acute phase of infection.  Positive  results are indicative of active infection with SARS-CoV-2.  Clinical  correlation with patient history and other diagnostic information is  necessary to determine patient infection status.  Positive results do  not rule out bacterial infection or co-infection with other viruses. If result is PRESUMPTIVE POSTIVE SARS-CoV-2 nucleic acids MAY BE PRESENT.   A presumptive positive result was obtained on the submitted specimen  and confirmed on repeat testing.  While 2019 novel coronavirus  (SARS-CoV-2) nucleic acids may be present in the submitted sample  additional confirmatory testing may be necessary for epidemiological  and / or clinical management purposes  to differentiate between  SARS-CoV-2 and other Sarbecovirus currently known to infect humans.  If clinically indicated additional testing with an alternate test  methodology [phone]) is advised. The SARS-CoV-2 RNA is generally  detectable in upper and lower respiratory sp ecimens during the acute  phase of infection. The expected result is Negative. Fact Sheet for Patients:  StrictlyIdeas.no Fact Sheet for Healthcare Providers: BankingDealers.co.za This test is not yet approved or cleared by the [place] FDA and has been authorized for detection and/or diagnosis of SARS-CoV-2 by FDA under an Emergency Use Authorization (EUA).  This EUA will  remain in effect (meaning this test can be used) for the duration of the COVID-19 declaration under Section 564(b)(1) of the Act, 21 U.S.C. section 360bbb-3(b)(1), unless the authorization is terminated or revoked sooner. Performed at [facility], [place] [address] [place], [place] [address]   Blood culture (routine x 2)     Status: None (Preliminary result)   Collection Time: 06/19/19 10:32 PM   Specimen: BLOOD  Result Value Ref Range Status   Specimen Description BLOOD LEFT ANTECUBITAL  Final   Special Requests   Final    BOTTLES DRAWN AEROBIC AND ANAEROBIC Blood Culture adequate volume   Culture   Final    NO GROWTH 2 DAYS Performed at [facility], [place] [address] [place], [place] [address]    Report Status PENDING  Incomplete  MRSA PCR Screening     Status: None   Collection Time: 06/21/19  8:33 AM   Specimen: Nasal Mucosa; Nasopharyngeal  Result Value Ref Range Status   MRSA by PCR NEGATIVE NEGATIVE Final    Comment:        The GeneXpert MRSA Assay (FDA approved for NASAL specimens only), is one component of a comprehensive MRSA colonization surveillance program. It is not intended to diagnose MRSA infection nor to guide or monitor treatment for MRSA infections. Performed at [facility], [place] [address] [place], [place] [address]          Radiology Studies: No results found.      Scheduled Meds:  feeding supplement (ENSURE ENLIVE)  237 mL Oral BID BM   levothyroxine  25 mcg Oral Q0600   sodium chloride flush  3 mL Intravenous Q12H   Continuous Infusions:  sodium chloride 75 mL/hr at 06/22/19 0638   azithromycin Stopped (06/21/19 1515)   cefTRIAXone (ROCEPHIN)  IV 1 g (06/21/19 1553)   heparin       LOS: 2 days    Time spent: 37 minutes    Daquisha Clermont J [place] DO [facility] Pager [phone]  If 7PM-7AM, please contact night-coverage [email or web address] Password [facility] 06/22/2019, 12:04 PM

## 2019-06-22 NOTE — Progress Notes (Signed)
Since foley cath insertion, pt has had bloody urine output. Pt is currently on heparin drip, informed Dr. [place] no new orders given at this time. Will continue to monitor.

## 2019-06-23 DIAGNOSIS — N179 Acute kidney failure, unspecified: Secondary | ICD-10-CM | POA: Diagnosis present

## 2019-06-23 LAB — BASIC METABOLIC PANEL
Anion gap: 11 (ref 5–15)
BUN: 25 mg/dL — ABNORMAL HIGH (ref 8–23)
CO2: 21 mmol/L — ABNORMAL LOW (ref 22–32)
Calcium: 8.7 mg/dL — ABNORMAL LOW (ref 8.9–10.3)
Chloride: 106 mmol/L (ref 98–111)
Creatinine, Ser: 1.51 mg/dL — ABNORMAL HIGH (ref 0.61–1.24)
GFR calc Af Amer: 49 mL/min — ABNORMAL LOW (ref >60)
GFR calc non Af Amer: 42 mL/min — ABNORMAL LOW (ref >60)
Glucose, Bld: 102 mg/dL — ABNORMAL HIGH (ref 70–99)
Potassium: 3.4 mmol/L — ABNORMAL LOW (ref 3.5–5.1)
Sodium: 138 mmol/L (ref 135–145)

## 2019-06-23 LAB — CBC
HCT: 32.8 % — ABNORMAL LOW (ref 39.0–52.0)
Hemoglobin: 11 g/dL — ABNORMAL LOW (ref 13.0–17.0)
MCH: 30.8 pg (ref 26.0–34.0)
MCHC: 33.5 g/dL (ref 30.0–36.0)
MCV: 91.9 fL (ref 80.0–100.0)
Platelets: 147 10*3/uL — ABNORMAL LOW (ref 150–400)
RBC: 3.57 MIL/uL — ABNORMAL LOW (ref 4.22–5.81)
RDW: 14.6 % (ref 11.5–15.5)
WBC: 10.2 10*3/uL (ref 4.0–10.5)
nRBC: 0 % (ref 0.0–0.2)

## 2019-06-23 LAB — APTT: aPTT: 71 seconds — ABNORMAL HIGH (ref 24–36)

## 2019-06-23 LAB — HEPARIN LEVEL (UNFRACTIONATED): Heparin Unfractionated: 0.74 IU/mL — ABNORMAL HIGH (ref 0.30–0.70)

## 2019-06-23 MED ORDER — APIXABAN 5 MG PO TABS: 5.0000 mg | ORAL_TABLET | Freq: Two times a day (BID) | ORAL | Status: DC

## 2019-06-23 MED ORDER — TAMSULOSIN HCL 0.4 MG PO CAPS
0.4000 mg | ORAL_CAPSULE | Freq: Every day | ORAL | Status: DC
  Administered 2019-06-23 – 2019-06-25 (×3): 0.4 mg via ORAL
  Filled 2019-06-23 (×3): qty 1

## 2019-06-23 MED ORDER — APIXABAN 5 MG PO TABS
10.0000 mg | ORAL_TABLET | Freq: Two times a day (BID) | ORAL | Status: DC
  Administered 2019-06-23 – 2019-06-25 (×4): 10 mg via ORAL
  Filled 2019-06-23 (×4): qty 2

## 2019-06-23 MED ORDER — POTASSIUM CHLORIDE 20 MEQ/15ML (10%) PO SOLN
40.0000 meq | Freq: Once | ORAL | Status: AC
  Administered 2019-06-23: 10:00:00 40 meq via ORAL
  Filled 2019-06-23: qty 30

## 2019-06-23 MED ORDER — ORAL CARE MOUTH RINSE
15.0000 mL | Freq: Two times a day (BID) | OROMUCOSAL | Status: DC
  Administered 2019-06-23 – 2019-06-24 (×4): 15 mL via OROMUCOSAL

## 2019-06-23 MED ORDER — APIXABAN 5 MG PO TABS: 10.0000 mg | ORAL_TABLET | Freq: Two times a day (BID) | ORAL | Status: DC

## 2019-06-23 NOTE — Progress Notes (Signed)
Palliative:  HPI: 84 y.o.malewith past medical history of vascular dementia, Parkinson's disease, hypothyroidism, GERD, HLD, BPH, h/o PE2011 and 2014(no anticoagulation s/t type II endoleak), AAA s/p endovascular repair, depressionadmitted on 7/11/2020from homewithAMS and fall and found to have submassive saddle PE with right heart strain as well as bilateral multifocal pneumonia with concern from family about aspiration at home.He continue 06/21/19 with coughing with any intake but with renal decline. 06/22/19 cough improved with careful, slow hand feeding and renal function hopefully s/t to urinary retention and foley placed. 06/23/19 renal function much improved but urine is bloody but Hgb remains stable - will continue to monitor.   I met today at Christian Wiley's bedside. He is resting comfortably. He greets me and tells me "I'm okay" when I ask how he is doing. He has no complaints and denies pain or SOB. RN and NT report coughing much improved and tolerating intake much better. Creatinine is much improved after foley placed. Urine is blood-tinged but Hgb has remained stable.   I called and spoke with wife, Christian Wiley, and reported above findings. She is very pleased and feels that he is making a turn around today. We further discussed what this means and she believes that we should hold on hospice care but she would like to still take him home. She is interested in resources to help assist her at home. All questions concerns addressed. Emotional support provided.   Exam: Alert, pleasantly confused. Mittens off. Foley with blood-tinged urine but fairly moderate amount of blood but no clots. Breathing, regular, unlabored on room air. Abd soft, non-tender.   Plan: - Home with [facility] outpatient palliative care (RN and Education officer, museum). They can assist with need to transition to hospice in future depending on [place] and outcomes. I discussed with [facility] liaison who confirmed that patient  would be eligible for [facility] program.  - Home health to be arranged by [facility].  - CMRN to see if bath aide can be arranged via [facility].  - I updated wife with above resources.  - Discussed with Dr. [place] and [facility].   [place], NP Palliative Medicine Team Pager [phone] (Please see [email or web address] for schedule) Team Phone [phone]    Greater than 50%  of this time was spent counseling and coordinating care related to the above assessment and plan

## 2019-06-23 NOTE — Progress Notes (Signed)
Physical Therapy Treatment Patient Details Name: Christian Wiley MRN: [id number] DOB: [birth year] Today's Date: 06/23/2019    History of Present Illness Christian Wiley is a 84 y.o. male with medical history significant for vascular dementia, Parkinson's disease, hypothyroidism, GERD, hyperlipidemia, BPH, RBBB, history of PE not on anticoagulation, AAA status post endovascular repair, depression who presents to the ED for evaluation of altered mental status after a fall.   Pt with sepsis with PNA, Bil PE,  and LLE DVT.     PT Comments    Pt wife in room on entry and pt reports being tired but wife able to convince to participate. Pt min A to get to EoB, with multiple attempts to come to standing pt unable to extend knees and posteriorly tilt pelvis to come to fully upright. Then sat EoB and performed LE exercises especially knee extension to aide in standing. Pt attempted standing x2 more and still unable to come to standing. PT continues to recommend SNF level rehab. PT will continue to follow acutely.    Follow Up Recommendations  SNF;Supervision/Assistance - 24 hour     Equipment Recommendations  Other (comment)(TBA)       Precautions / Restrictions Precautions Precautions: Fall Restrictions Weight Bearing Restrictions: No    Mobility  Bed Mobility Overal bed mobility: Needs Assistance Bed Mobility: Rolling;Sidelying to Sit;Sit to Supine Rolling: Min assist Sidelying to sit: Min assist   Sit to supine: Min assist   General bed mobility comments: min A for trunk to upright and LE mangement back into bed after session  Transfers Overall transfer level: Needs assistance Equipment used: Rolling walker (2 wheeled) Transfers: Sit to/from Stand Sit to Stand: From elevated surface;+2 physical assistance;Total assist         General transfer comment: Pt unable to posteriorly rotate pelvis to come to fully upright, initially could not extend at knees. Pt attempted approx 6  times total however unable to achieve fully upright even from elevated surface and total A        Balance Overall balance assessment: Needs assistance Sitting-balance support: No upper extremity supported;Feet supported Sitting balance-Leahy Scale: Fair Sitting balance - Comments: Able to sit EOB with min guard assist.   Standing balance support: During functional activity;Single extremity supported Standing balance-Leahy Scale: Poor Standing balance comment: unable to stand fully upright with +1 mod assist.                             Cognition Arousal/Alertness: Awake/alert Behavior During Therapy: WFL for tasks assessed/performed Overall Cognitive Status: History of cognitive impairments - at baseline Area of Impairment: Orientation;Following commands;Safety/judgement;Awareness;Problem solving                 Orientation Level: Disoriented to;Time;Situation(Able to state [place])     Following Commands: Follows one step commands inconsistently;Follows one step commands with increased time Safety/Judgement: Decreased awareness of safety;Decreased awareness of deficits   Problem Solving: Slow processing;Difficulty sequencing;Requires verbal cues;Requires tactile cues;Decreased initiation General Comments: pt wife in room and reports dementia and Parkinsons       Exercises General Exercises - Lower Extremity Long Arc Quad: AROM;Both;10 reps;Seated Hip Flexion/Marching: AROM;Both;10 reps;Seated    General Comments General comments (skin integrity, edema, etc.): Pt wife in room and pt seemed content to participate in therapy, VSS      Pertinent Vitals/Pain Pain Assessment: No/denies pain           PT Goals (current  goals can now be found in the care plan section) Acute Rehab PT Goals Patient Stated Goal: unable to state PT Goal Formulation: With patient Time For Goal Achievement: 07/05/19 Potential to Achieve Goals: Good    Frequency     Min 3X/week      PT Plan Current plan remains appropriate       AM-PAC PT "6 Clicks" Mobility   Outcome Measure  Help needed turning from your back to your side while in a flat bed without using bedrails?: A Lot Help needed moving from lying on your back to sitting on the side of a flat bed without using bedrails?: A Lot Help needed moving to and from a bed to a chair (including a wheelchair)?: Total Help needed standing up from a chair using your arms (e.g., wheelchair or bedside chair)?: A Lot Help needed to walk in hospital room?: Total Help needed climbing 3-5 steps with a railing? : Total 6 Click Score: 9    End of Session Equipment Utilized During Treatment: Gait belt Activity Tolerance: Patient limited by fatigue Patient left: in bed;with call bell/phone within reach;with bed alarm set;with family/visitor present Nurse Communication: Mobility status PT Visit Diagnosis: Unsteadiness on feet (R26.81);Muscle weakness (generalized) (M62.81)     Time: 0712-1975 PT Time Calculation (min) (ACUTE ONLY): 25 min  Charges:  $Therapeutic Exercise: 8-22 mins $Therapeutic Activity: 8-22 mins                     Markesha Hannig B. Migdalia Dk PT, DPT Acute Rehabilitation Services Pager [phone] Office [phone]    [place] 06/23/2019, 5:52 PM

## 2019-06-23 NOTE — Progress Notes (Addendum)
ANTICOAGULATION CONSULT NOTE - Follow Up Consult  Pharmacy Consult for heparin Indication: pulmonary embolus  Allergies  Allergen Reactions  . Ambien [Zolpidem Tartrate] Other (See Comments)    Caused anxiety and confusion    Patient Measurements: Height: 6' (182.9 cm) Weight: 188 lb 7.9 oz (85.5 kg) IBW/kg (Calculated) : 77.6 Heparin Dosing Weight: 85.4 kg   Vital Signs: Temp: 97.5 F (36.4 C) (07/15 0950) Temp Source: Oral (07/15 0950) BP: 160/68 (07/15 0950) Pulse Rate: 117 (07/15 0950)  Labs: Recent Labs    06/21/19 0344 06/21/19 0902 06/22/19 0548 06/22/19 0845 06/23/19 0423  HGB 11.3*  --  11.1*  --  11.0*  HCT 33.9*  --  33.5*  --  32.8*  PLT 125*  --  128*  --  147*  APTT  --   --   --  79* 71*  HEPARINUNFRC 0.36  --  2.10*  --  0.74*  CREATININE 2.87* 3.31* 4.28*  --  1.51*    Estimated Creatinine Clearance: 41.4 mL/min (A) (by C-G formula based on SCr of 1.51 mg/dL (H)).   Assessment: 84 yo male admitted 06/19/2019 with sepsis/PNA and pulmonary embolus. One dose of apixaban was administered on 7/13. Patient noted with AKI and appears closer to baseline -SCr= 1,5, CBC stable -aPTT= 71  Goal of Therapy:  Heparin level 0.3-0.7 units/ml  APTT goal 66-102 sec Monitor platelets by anticoagulation protocol: Yes   Plan:  Continue heparin 1200 units/hr Check heparin level, aPTT, and CBC daily Consider starting apixaban if not procedures planned  Hildred Laser, PharmD Clinical Pharmacist **Pharmacist phone directory can now be found on [email or web address] ([email or web address]).  Listed under [place].  Addendum -adding apixaban  Plan Cont heparin 1200 units/hr; stop at 10pm Start apixaban 10mg  bid on 7/15; change to 5mg  po bid on 7/23  Hildred Laser, PharmD Clinical Pharmacist **Pharmacist phone directory can now be found on [email or web address] ([email or web address]).  Listed under [place].

## 2019-06-23 NOTE — Progress Notes (Signed)
Pt wife updated via phone

## 2019-06-23 NOTE — Progress Notes (Signed)
Initial Nutrition Assessment  DOCUMENTATION CODES:   Not applicable  INTERVENTION:    Continue Ensure Enlive po BID, each supplement provides 350 kcal and 20 grams of protein  Add Magic cup TID with meals, each supplement provides 290 kcal and 9 grams of protein  NUTRITION DIAGNOSIS:   Increased nutrient needs related to acute illness as evidenced by estimated needs.  GOAL:   Patient will meet greater than or equal to 90% of their needs  MONITOR:   PO intake, Supplement acceptance, Weight trends, Labs, I & O's, Skin  REASON FOR ASSESSMENT:   Malnutrition Screening Tool    ASSESSMENT:   Patient with PMH significant for vascular dementia, Parkinson's disease, GERD, HLD, BPH, and AAA s/p endovascular repair. Presents this admission with acute PE and bilateral lower lobe PNA.   Unable to speak with pt at bedside. SLP recommend DYS3 diet with thin liquids. Meal completions charted as 10-100% for pt's last three meals. PMT spoke with wife. No artifical feeding desired. RD to supplementation to maximize kcal and protein this stay.   Weight history limited in chart. Admission weight: 88 kg  Current weight: 85.5 kg   I/O: -2,521 ml since admit UOP: 7,000 ml x 24 hrs   Drips: NS @ 75 ml/hr Labs: K 3.4 (L) Cr 1.51- trending down  Diet Order:   Diet Order            DIET DYS 3 Room service appropriate? No; Fluid consistency: Thin  Diet effective now              EDUCATION NEEDS:   Not appropriate for education at this time  Skin:  Skin Assessment: Skin Integrity Issues: Skin Integrity Issues:: Other (Comment) Other: skin tear- L elbow  Last BM:  7/14  Height:   Ht Readings from Last 1 Encounters:  06/20/19 6' (1.829 m)    Weight:   Wt Readings from Last 1 Encounters:  06/23/19 85.5 kg    Ideal Body Weight:  80.9 kg  BMI:  Body mass index is 25.56 kg/m.  Estimated Nutritional Needs:   Kcal:  2150-2350 kcal  Protein:  105-115 grams  Fluid:   >/= 2.1 L/day   Mariana Single RD, LDN Clinical Nutrition Pager # - [phone]

## 2019-06-23 NOTE — Care Management Important Message (Signed)
Important Message  Patient Details  Name: CAMARI WISHAM MRN: [id number] Date of Birth: 1935-04-28   Medicare Important Message Given:  Yes     Shelda Altes 06/23/2019, 12:58 PM

## 2019-06-23 NOTE — Progress Notes (Signed)
PROGRESS NOTE    Christian Wiley  RSW:[id number] DOB: 03/18/1935 DOA: 06/19/2019 PCP: [name], MD    Brief Narrative:   Christian Wiley is a 84 y.o. male with medical history significant for vascular dementia, Parkinson's disease, hypothyroidism, GERD, hyperlipidemia, BPH, RBBB, history of PE not on anticoagulation, AAA status post endovascular repair, depression who presents to the ED for evaluation of altered mental status after a fall.  Patient is unable to provide any history therefore entirety of history is obtained from EDP, chart review, and wife by phone.  Wife states that patient at baseline is nonverbal and only occasionally alert.  He needs help with all of his ADLs but can walk with the use of a walker.  She states that he seems to be choking every time he eats. She says that he has been treated for UTI with cefdinir completing day 6 of 7 so far.  She says today while walking with his walker he began to fall forwards.  Luckily his wife was by and was unable to catch him and slowly sliding down to the ground.  She says he did have a small abrasion to his elbow but did not hit his head.  She called EMS and per EDP patient was noted to be hypoxic requiring nonrebreather and was brought to the ED for further evaluation.  ED Course: Initial vitals showed BP 133/55, pulse 88, RR 30, temp 101.8 Fahrenheit rectally, SPO2 97% on 4 L supplemental O2 via [place].  Labs are notable for WBC 14.4, hemoglobin 13.6, platelets 157,000, sodium 130, potassium 4.5, bicarb 20, BUN 28, creatinine 1.5, serum glucose 150, high-sensitivity troponin IL-11, lactic acid 4.1, SARS-CoV-2 test negative.  Urinalysis showed negative nitrites, trace leukocytes, 21-50 RBC/hpf, 0-5 WBC /hpf, rare bacteria on microscopy.  Blood cultures were drawn and pending.  Portable chest x-ray showed mild patchy bilateral lower lung opacities.  CT head without contrast was negative for acute intracranial abnormality.   Atrophy and mild small vessel ischemic changes of the white matter was noted.  CTA chest PE study showed an acute PE involving the distal right main pulmonary artery with extension into the right lobar artery and upper and lower lobe segmental and subsegmental branch vessels.  A small left lower lobe subsegmental PE was also seen.  CT findings were positive for evidence of right heart strain consistent with at least submassive PE.  Changes suggestive of multifocal pneumonia worse in the bilateral lower lobes was also seen.  Patient was given 2.6 L normal saline and started on IV vancomycin and Zosyn.  PCCM were consulted who recommended hospitalist admission.  The hospitalist service was consulted to admit for further evaluation and management.  Assessment & Plan:   Principal Problem:   Sepsis due to pneumonia [facility]) Active Problems:   Parkinson disease ([place])   Vascular dementia ([place])   Hypothyroidism   History of pulmonary embolus (PE)   Depression with anxiety   Hyperlipidemia   BPH (benign prostatic hyperplasia)   AKI (acute kidney injury) ([place])   Acute pulmonary embolism (HCC)   Goals of care, counseling/discussion   Palliative care encounter   Submassive bilateral pulmonary embolism with right heart strain Acute DVT left common femoral vein, left popliteal vein, left peroneal vein With CT evidence of right heart strain.  Patient has a history of PE and per wife was on Xarelto until  1 year ago which was discontinued due to a type II endoleak found on angiography seemingly resolution of the  leak after holding anticoagulation.  Vascular ultrasound duplex bilateral lower extremities notable for acute DVT on left.  Transthoracic echocardiogram with EF 58-52%, diastolic dysfunction, no mention of heart strain. --PCCM following, appreciate assistance --Continue IV heparin drip; will await transition to Eliquis until renal function improves --Continue supplemental oxygen; titrate for  SPO2 greater than 92%  Pneumonia, multifocal; possible aspiration Wife reports behavior suggestive of aspiration.  CT PA notable for multifocal pneumonia.  Procalcitonin elevated at 15.50 with a lactic acid of 1.6.  COVID-19 test negative. --WBC 14.4-->16.0-->15.7-->13.4-->10.2 --Blood cultures x 2: NG x 3 days --MRSA PCR negative --vancomycin/Zosyn changed to azithromycin/ceftriaxone 7/13 --Dysphagia diet per speech therapy --Daily CBC  Dysphasia with possible aspiration: Discussed with patient's spouse, Christian Wiley today regarding his issues on swallowing.  She has noticed that he has significant cough while eating that has been present over the past 2 weeks.  Concern for possible aspiration given pneumonia as above.  Discussed with her that if he is unable to eat or drink orally, that he has advanced directives that would decline use of artificial means with tube feeding. --Speech therapy following, no signs of aspiration on MBS S yesterday --Dysphagia diet --Aspiration precautions  Acute kidney injury: Creatinine 1.50 on admission. Likely prerenal, received 2.6 L normal saline in the ED.   --Creatinine 1.50-->1.42-->2.87-->3.31-->4.28-->1.51 --bladder scan with >685 retained urine 7/14; distended abd --foley cath placed with >1527mL urine out --continue Foley catheter today, likely attempt voiding trial tomorrow --Tamsulosin 0.4 mg p.o. daily --Avoid nephrotoxins, renally dose all medications --Daily BMP  Hyponatremia: Likely from hypovolemia, received 2.6 L normal saline bolus in ED. --Na 130-->135-->135-->136 --gentle IVF hydration --Continue to monitor daily BMP  Hyperlipidemia: --continue home atorvastatin 40 mg daily  Hypothyroidism: --Continue Synthroid 25 mcg p.o. daily  Depression: --Lexapro 10 mg p.o. daily  Vascular Parkinsonism and dementia with behavioral disturbance Failure to thrive/progressive functional decline Patient with advanced vascular parkinsonism  and dementia, now essentially nonverbal and unable to perform ADLs per wife.  Is able to ambulate some with the use of a walker otherwise quality of life is poor.  I do long discussion with his wife by phone who states that he has previously told his wife and daughter that in the event of cardiac arrest or significant respiratory distress he would not want to be resuscitated or have artificial means of nutrition. --Continue Nuplazid 34 mg daily --Palliative care consult for assistance with further medical decision making/goals of care and family support    DVT prophylaxis: Heparin drip; plan to transition to Eliquis when renal function improves, likely tomorrow Code Status: DNR Family Communication: none Disposition Plan: PT/OT recommends SNF, likely will discharge home with home health services given his dementia and family wishes   Consultants:   Palliative care  Procedures:  VAS Bilateral LE Duplex Ultrasound Summary: Right: There is no evidence of deep vein thrombosis in the lower extremity. Left: Findings consistent with acute deep vein thrombosis involving the left common femoral vein, left popliteal vein, and left peroneal veins. Mobile thrombus noted in the left common femoral and external iliac veins  Transthoracic echocardiogram: 7/12 IMPRESSIONS   1. The left ventricle has normal systolic function with an ejection fraction of 60-65%. The cavity size was normal. Left ventricular diastolic Doppler parameters are consistent with impaired relaxation.  2. The right ventricle has normal systolic function. The cavity was normal. There is no increase in right ventricular wall thickness.  3. The aortic valve is tricuspid. Mild thickening of the aortic valve.  Mild calcification of the aortic valve.  MBSS: Pt presents with mild oropharyngeal dysphagia c/b nonrotary mastication, decreased lingual coordination, and delayed swallow initiation.  These deficits resulted in impaired bolus  formation and delayed A-P transit, swallow initiation at the vallecula with solids, and piecemeal deglutition.  Pharyngeal residue was cleared with spontaneous subsequent swallows.  There was no penetration or aspiration with any consistencies.  Pt was noted to cough during evaluation in absence of penetration/aspiration. Recommend regular texture diet with thin liquid.  Antimicrobials:   Vancomycin 7/11 - 7/13  Zosyn 7/11 - 7/13  Azithromycin 7/13>>  Ceftriaxone 7/13>>   Subjective:  Patient seen and examined at bedside, pleasantly confused.  Creatinine much improved following Foley catheter insertion yesterday.  Continues on heparin drip for acute PE/DVT.  No other complaints today from patient.  No family present at bedside, but updated wife via telephone this morning about events and plan of care.  Denies headache, no shortness of breath, no chest pain, no abdominal pain.  No other acute concerns overnight per nursing staff.  Objective: Vitals:   06/22/19 1940 06/22/19 2354 06/23/19 0440 06/23/19 0950  BP: 128/79 (!) 157/69 (!) 146/69 (!) 160/68  Pulse: 74 74 89 (!) 117  Resp: (!) 29 (!) 29 (!) 31 (!) 22  Temp: 98.1 F (36.7 C) 97.9 F (36.6 C) 98.1 F (36.7 C) (!) 97.5 F (36.4 C)  TempSrc: Axillary Oral Oral Oral  SpO2: 99% 99% 98% 98%  Weight:   85.5 kg   Height:        Intake/Output Summary (Last 24 hours) at 06/23/2019 1007 Last data filed at 06/23/2019 0936 Gross per 24 hour  Intake 2645.58 ml  Output 6701 ml  Net -4055.42 ml   Filed Weights   06/21/19 0350 06/22/19 0402 06/23/19 0440  Weight: 86.2 kg 87.9 kg 85.5 kg    Examination:  General exam: Appears calm and comfortable, pleasantly confused Respiratory system: Coarse breath sounds bilaterally, slightly decreased bilateral bases, no wheezing, normal respiratory effort, on room air Cardiovascular system: S1 & S2 heard, RRR. No JVD, murmurs, rubs, gallops or clicks. No pedal edema. Gastrointestinal  system: Abdomen soft and nontender.  Nondistended.  No organomegaly or masses felt. Normal bowel sounds heard. GU: Foley catheter noted, slightly blood-tinged urine Central nervous system: Alert, pleasantly confused, no focal neurological deficits Extremities: Symmetric 5 x 5 power. Skin: No rashes, lesions or ulcers Psychiatry: Pleasantly confused, normal mood/affect    Data Reviewed: I have personally reviewed following labs and imaging studies  CBC: Recent Labs  Lab 06/19/19 2225 06/20/19 0353 06/21/19 0344 06/22/19 0548 06/23/19 0423  WBC 14.4* 16.0* 15.7* 13.4* 10.2  NEUTROABS 9.1*  --   --   --   --   HGB 13.6 12.0* 11.3* 11.1* 11.0*  HCT 43.1 36.4* 33.9* 33.5* 32.8*  MCV 97.1 93.1 91.9 92.8 91.9  PLT 157 121* 125* 128* 284*   Basic Metabolic Panel: Recent Labs  Lab 06/19/19 2225 06/20/19 0353 06/21/19 0344 06/21/19 0902 06/22/19 0548 06/23/19 0423  NA 130* 135 135  --  136 138  K 4.5 3.6 3.9  --  3.9 3.4*  CL 98 104 103  --  107 106  CO2 20* 22 18*  --  18* 21*  GLUCOSE 150* 136* 115*  --  108* 102*  BUN 28* 25* 42*  --  48* 25*  CREATININE 1.50* 1.42* 2.87* 3.31* 4.28* 1.51*  CALCIUM 8.6* 8.1* 8.5*  --  8.2* 8.7*  MG  --   --  1.7  --   --   --    GFR: Estimated Creatinine Clearance: 41.4 mL/min (A) (by C-G formula based on SCr of 1.51 mg/dL (H)). Liver Function Tests: Recent Labs  Lab 06/19/19 2225  AST 24  ALT 29  ALKPHOS 96  BILITOT 0.9  PROT 5.8*  ALBUMIN 3.1*   No results for input(s): LIPASE, AMYLASE in the last 168 hours. No results for input(s): AMMONIA in the last 168 hours. Coagulation Profile: Recent Labs  Lab 06/19/19 2225 06/20/19 0353  INR 1.1 1.4*   Cardiac Enzymes: No results for input(s): CKTOTAL, CKMB, CKMBINDEX, TROPONINI in the last 168 hours. BNP (last 3 results) No results for input(s): PROBNP in the last 8760 hours. HbA1C: No results for input(s): HGBA1C in the last 72 hours. CBG: No results for input(s): GLUCAP  in the last 168 hours. Lipid Profile: No results for input(s): CHOL, HDL, LDLCALC, TRIG, CHOLHDL, LDLDIRECT in the last 72 hours. Thyroid Function Tests: No results for input(s): TSH, T4TOTAL, FREET4, T3FREE, THYROIDAB in the last 72 hours. Anemia Panel: No results for input(s): VITAMINB12, FOLATE, FERRITIN, TIBC, IRON, RETICCTPCT in the last 72 hours. Sepsis Labs: Recent Labs  Lab 06/19/19 2225 06/20/19 0040 06/20/19 0353 06/21/19 0344 06/22/19 0548  PROCALCITON  --   --  15.51 22.33 8.55  LATICACIDVEN 4.1* 1.6  --   --   --     Recent Results (from the past 240 hour(s))  Blood culture (routine x 2)     Status: None (Preliminary result)   Collection Time: 06/19/19 10:10 PM   Specimen: BLOOD RIGHT FOREARM  Result Value Ref Range Status   Specimen Description BLOOD RIGHT FOREARM  Final   Special Requests   Final    BOTTLES DRAWN AEROBIC AND ANAEROBIC Blood Culture adequate volume   Culture   Final    NO GROWTH 3 DAYS Performed at [facility], [place] [address] [place], [place] [address]    Report Status PENDING  Incomplete  Urine culture     Status: None   Collection Time: 06/19/19 10:28 PM   Specimen: Urine, Clean Catch  Result Value Ref Range Status   Specimen Description URINE, CLEAN CATCH  Final   Special Requests NONE  Final   Culture   Final    NO GROWTH Performed at [facility], [place] [address], [place], [place] [address]    Report Status 06/21/2019 FINAL  Final  SARS Coronavirus 2 (CEPHEID- Performed in [place] [facility] lab), Hosp Order     Status: None   Collection Time: 06/19/19 10:28 PM   Specimen: Urine, Catheterized; Nasopharyngeal  Result Value Ref Range Status   SARS Coronavirus 2 NEGATIVE NEGATIVE Final    Comment: (NOTE) If result is NEGATIVE SARS-CoV-2 target nucleic acids are NOT DETECTED. The SARS-CoV-2 RNA is generally detectable in upper and lower  respiratory specimens during the acute phase of infection. The lowest   concentration of SARS-CoV-2 viral copies this assay can detect is 250  copies / mL. A negative result does not preclude SARS-CoV-2 infection  and should not be used as the sole basis for treatment or other  patient management decisions.  A negative result may occur with  improper specimen collection / handling, submission of specimen other  than nasopharyngeal swab, presence of viral mutation(s) within the  areas targeted by this assay, and inadequate number of viral copies  (<250 copies / mL). A negative result must be combined with clinical  observations,  patient history, and epidemiological information. If result is POSITIVE SARS-CoV-2 target nucleic acids are DETECTED. The SARS-CoV-2 RNA is generally detectable in upper and lower  respiratory specimens dur ing the acute phase of infection.  Positive  results are indicative of active infection with SARS-CoV-2.  Clinical  correlation with patient history and other diagnostic information is  necessary to determine patient infection status.  Positive results do  not rule out bacterial infection or co-infection with other viruses. If result is PRESUMPTIVE POSTIVE SARS-CoV-2 nucleic acids MAY BE PRESENT.   A presumptive positive result was obtained on the submitted specimen  and confirmed on repeat testing.  While 2019 novel coronavirus  (SARS-CoV-2) nucleic acids may be present in the submitted sample  additional confirmatory testing may be necessary for epidemiological  and / or clinical management purposes  to differentiate between  SARS-CoV-2 and other Sarbecovirus currently known to infect humans.  If clinically indicated additional testing with an alternate test  methodology [phone]) is advised. The SARS-CoV-2 RNA is generally  detectable in upper and lower respiratory sp ecimens during the acute  phase of infection. The expected result is Negative. Fact Sheet for Patients:  StrictlyIdeas.no Fact Sheet  for Healthcare Providers: BankingDealers.co.za This test is not yet approved or cleared by the [place] FDA and has been authorized for detection and/or diagnosis of SARS-CoV-2 by FDA under an Emergency Use Authorization (EUA).  This EUA will remain in effect (meaning this test can be used) for the duration of the COVID-19 declaration under Section 564(b)(1) of the Act, 21 U.S.C. section 360bbb-3(b)(1), unless the authorization is terminated or revoked sooner. Performed at [facility], [place] [address] [place], [place] [address]   Blood culture (routine x 2)     Status: None (Preliminary result)   Collection Time: 06/19/19 10:32 PM   Specimen: BLOOD  Result Value Ref Range Status   Specimen Description BLOOD LEFT ANTECUBITAL  Final   Special Requests   Final    BOTTLES DRAWN AEROBIC AND ANAEROBIC Blood Culture adequate volume   Culture   Final    NO GROWTH 3 DAYS Performed at [facility], [place] [address] [place], [place] [address]    Report Status PENDING  Incomplete  MRSA PCR Screening     Status: None   Collection Time: 06/21/19  8:33 AM   Specimen: Nasal Mucosa; Nasopharyngeal  Result Value Ref Range Status   MRSA by PCR NEGATIVE NEGATIVE Final    Comment:        The GeneXpert MRSA Assay (FDA approved for NASAL specimens only), is one component of a comprehensive MRSA colonization surveillance program. It is not intended to diagnose MRSA infection nor to guide or monitor treatment for MRSA infections. Performed at [facility], [place] [address] [place], [place] [address]          Radiology Studies: US Renal  Result Date: 06/22/2019 CLINICAL DATA:  Acute renal failure.  Hypertension. EXAM: RENAL / URINARY TRACT ULTRASOUND COMPLETE COMPARISON:  None. FINDINGS: Right Kidney: Renal measurements: 13.4 x 5.3 x 5.7 cm = volume: 211 mL. Echogenicity is within normal limits. Cortex is mildly thinned circumferentially. No mass or  hydronephrosis visualized. Left Kidney: Renal measurements: 12 x 6 x 4.4 cm = volume: 167 mL. Cortical echogenicity and thickness is within normal limits. No mass seen. There is hydronephrosis, mild to moderate in degree. Bladder: Not seen, decompressed by Foley catheter. Incidental note made of bilateral pleural effusions. IMPRESSION: 1. LEFT-sided  hydronephrosis, mild to moderate in degree. 2. RIGHT renal cortex is mildly thinned circumferentially. RIGHT kidney otherwise unremarkable. No hydronephrosis. 3. Bladder decompressed by Foley catheter. 4. Bilateral pleural effusions. Electronically Signed   By: Franki Cabot M.D.   On: 06/22/2019 14:24        Scheduled Meds: . feeding supplement (ENSURE ENLIVE)  237 mL Oral BID BM  . levothyroxine  25 mcg Oral Q0600  . mouth rinse  15 mL Mouth Rinse BID  . sodium chloride flush  3 mL Intravenous Q12H  . tamsulosin  0.4 mg Oral Daily   Continuous Infusions: . sodium chloride 75 mL/hr at 06/23/19 0600  . azithromycin Stopped (06/22/19 1732)  . cefTRIAXone (ROCEPHIN)  IV 1 g (06/22/19 1505)  . heparin 1,200 Units/hr (06/23/19 0600)     LOS: 3 days    Time spent: 37 minutes    Eric J [place] DO [facility] Pager [phone]  If 7PM-7AM, please contact night-coverage [email or web address] Password [email or web address] 06/23/2019, 10:07 AM

## 2019-06-23 NOTE — Care Management (Signed)
Per Amilllio S. W/ INVISION Co Pay amount for Eliquis 5 mg. twice a day$23.30 for 30 day supply 60 for 30.Marland Kitchen No PA required, This is a teir 3 medication,no deductible required for this plan. Pt. may use any pharmacy.

## 2019-06-23 NOTE — Progress Notes (Signed)
Spoke with [place] with [facility] to see about possible [facility] services. Pt does not have ACO banner in epic- and per conversation with [place]- pt's HTA plan is not under [facility] however may be under either [facility] or [facility] community based care management. [place] will reach out to them to see if pt under one of them and then they can f/u for transition needs. Pt. Would be eligible for community PC follow up and is in-network with [facility] should wife want to use them for PC follow in the home. CM will continue to follow and reach out to wife for transition needs and planning.

## 2019-06-23 NOTE — Plan of Care (Signed)
?  Problem: Clinical Measurements: ?Goal: Will remain free from infection ?Outcome: Progressing ?  ?

## 2019-06-23 NOTE — Consult Note (Signed)
   [facility] CM Inpatient Consult   06/23/2019  Christian Wiley [place] Nov 17, 1935 [id number]   Thank you for the consult. We have reviewed your referral request and this patient's care management services are with [facility]. [facility] has partnered with [facility] ([facility]).   For questions, please contact:  Natividad Brood, RN BSN [facility] Liaison  [phone] business mobile phone Toll free office [phone]  Fax number: [phone] [email or web address]

## 2019-06-24 LAB — BASIC METABOLIC PANEL
Anion gap: 10 (ref 5–15)
BUN: 16 mg/dL (ref 8–23)
CO2: 22 mmol/L (ref 22–32)
Calcium: 8.8 mg/dL — ABNORMAL LOW (ref 8.9–10.3)
Chloride: 106 mmol/L (ref 98–111)
Creatinine, Ser: 1.04 mg/dL (ref 0.61–1.24)
GFR calc Af Amer: >60 mL/min (ref >60)
GFR calc non Af Amer: >60 mL/min (ref >60)
Glucose, Bld: 127 mg/dL — ABNORMAL HIGH (ref 70–99)
Potassium: 3.7 mmol/L (ref 3.5–5.1)
Sodium: 138 mmol/L (ref 135–145)

## 2019-06-24 LAB — CBC
HCT: 33.5 % — ABNORMAL LOW (ref 39.0–52.0)
Hemoglobin: 11.1 g/dL — ABNORMAL LOW (ref 13.0–17.0)
MCH: 30.6 pg (ref 26.0–34.0)
MCHC: 33.1 g/dL (ref 30.0–36.0)
MCV: 92.3 fL (ref 80.0–100.0)
Platelets: 150 10*3/uL (ref 150–400)
RBC: 3.63 MIL/uL — ABNORMAL LOW (ref 4.22–5.81)
RDW: 14.5 % (ref 11.5–15.5)
WBC: 8.7 10*3/uL (ref 4.0–10.5)
nRBC: 0 % (ref 0.0–0.2)

## 2019-06-24 LAB — CULTURE, BLOOD (ROUTINE X 2)
Culture: NO GROWTH
Culture: NO GROWTH
Special Requests: ADEQUATE
Special Requests: ADEQUATE

## 2019-06-24 MED ORDER — HYDRALAZINE HCL 20 MG/ML IJ SOLN
10.0000 mg | Freq: Four times a day (QID) | INTRAMUSCULAR | Status: DC | PRN
  Administered 2019-06-24: 10 mg via INTRAVENOUS
  Filled 2019-06-24: qty 1

## 2019-06-24 MED ORDER — HYDRALAZINE HCL 20 MG/ML IJ SOLN
5.0000 mg | Freq: Once | INTRAMUSCULAR | Status: AC
  Administered 2019-06-24: 5 mg via INTRAVENOUS
  Filled 2019-06-24: qty 1

## 2019-06-24 MED ORDER — AMLODIPINE BESYLATE 5 MG PO TABS
5.0000 mg | ORAL_TABLET | Freq: Every day | ORAL | Status: DC
  Administered 2019-06-24 – 2019-06-25 (×2): 5 mg via ORAL
  Filled 2019-06-24 (×2): qty 1

## 2019-06-24 NOTE — Progress Notes (Signed)
PROGRESS NOTE    Jamail Cullers Rowlette  GYI:[id number] DOB: [birth year] DOA: 06/19/2019 PCP: [name], MD    Brief Narrative:   Christian Wiley is a 84 y.o. male with medical history significant for vascular dementia, Parkinson's disease, hypothyroidism, GERD, hyperlipidemia, BPH, RBBB, history of PE not on anticoagulation, AAA status post endovascular repair, depression who presents to the ED for evaluation of altered mental status after a fall.  Patient is unable to provide any history therefore entirety of history is obtained from EDP, chart review, and wife by phone.  Wife states that patient at baseline is nonverbal and only occasionally alert.  He needs help with all of his ADLs but can walk with the use of a walker.  She states that he seems to be choking every time he eats. She says that he has been treated for UTI with cefdinir completing day 6 of 7 so far.  She says today while walking with his walker he began to fall forwards.  Luckily his wife was by and was unable to catch him and slowly sliding down to the ground.  She says he did have a small abrasion to his elbow but did not hit his head.  She called EMS and per EDP patient was noted to be hypoxic requiring nonrebreather and was brought to the ED for further evaluation.  ED Course: Initial vitals showed BP 133/55, pulse 88, RR 30, temp 101.8 Fahrenheit rectally, SPO2 97% on 4 L supplemental O2 via [place].  Labs are notable for WBC 14.4, hemoglobin 13.6, platelets 157,000, sodium 130, potassium 4.5, bicarb 20, BUN 28, creatinine 1.5, serum glucose 150, high-sensitivity troponin IL-11, lactic acid 4.1, SARS-CoV-2 test negative.  Urinalysis showed negative nitrites, trace leukocytes, 21-50 RBC/hpf, 0-5 WBC /hpf, rare bacteria on microscopy.  Blood cultures were drawn and pending.  Portable chest x-ray showed mild patchy bilateral lower lung opacities.  CT head without contrast was negative for acute intracranial abnormality.   Atrophy and mild small vessel ischemic changes of the white matter was noted.  CTA chest PE study showed an acute PE involving the distal right main pulmonary artery with extension into the right lobar artery and upper and lower lobe segmental and subsegmental branch vessels.  A small left lower lobe subsegmental PE was also seen.  CT findings were positive for evidence of right heart strain consistent with at least submassive PE.  Changes suggestive of multifocal pneumonia worse in the bilateral lower lobes was also seen.  Patient was given 2.6 L normal saline and started on IV vancomycin and Zosyn.  PCCM were consulted who recommended hospitalist admission.  The hospitalist service was consulted to admit for further evaluation and management.  Assessment & Plan:   Principal Problem:   Sepsis due to pneumonia [facility]) Active Problems:   Parkinson disease ([place])   Vascular dementia ([place])   Hypothyroidism   History of pulmonary embolus (PE)   Depression with anxiety   Hyperlipidemia   BPH (benign prostatic hyperplasia)   AKI (acute kidney injury) ([place])   Acute pulmonary embolism (HCC)   Goals of care, counseling/discussion   Palliative care encounter   Acute renal failure (ARF) ([place])   Submassive bilateral pulmonary embolism with right heart strain Acute DVT left common femoral vein, left popliteal vein, left peroneal vein With CT evidence of right heart strain.  Patient has a history of PE and per wife was on Xarelto until  1 year ago which was discontinued due to a type II endoleak  found on angiography seemingly resolution of the leak after holding anticoagulation.  Vascular ultrasound duplex bilateral lower extremities notable for acute DVT on left.  Transthoracic echocardiogram with EF 08-65%, diastolic dysfunction, no mention of heart strain. --PCCM following, appreciate assistance --IV heparin transition to Eliquis on 06/23/2019 --Oxygenating well on room air  Pneumonia,  multifocal; possible aspiration Wife reports behavior suggestive of aspiration.  CT PA notable for multifocal pneumonia.  Procalcitonin elevated at 15.50 with a lactic acid of 1.6.  COVID-19 test negative. --WBC 14.4-->16.0-->15.7-->13.4-->10.2-->8.7 --Blood cultures x 2: NG x 4 days --MRSA PCR negative --vancomycin/Zosyn changed to azithromycin/ceftriaxone 7/13 --Dysphagia diet per speech therapy --Daily CBC  Dysphasia with possible aspiration: Discussed with patient's spouse, [name] today regarding his issues on swallowing.  She has noticed that he has significant cough while eating that has been present over the past 2 weeks.  Concern for possible aspiration given pneumonia as above.  Discussed with her that if he is unable to eat or drink orally, that he has advanced directives that would decline use of artificial means with tube feeding. --Speech therapy following, no signs of aspiration on MBS S yesterday --Dysphagia diet --Aspiration precautions  Acute kidney injury: Creatinine 1.50 on admission. Likely prerenal, received 2.6 L normal saline in the ED.   --Creatinine 1.50-->1.42-->2.87-->3.31-->4.28-->1.51-->1.04 --bladder scan with >685 retained urine 7/14; distended abd --foley cath placed with >1519mL urine out on 7/14 --Tamsulosin 0.4 mg p.o. daily --DC Foley today, voiding trial, bladder scan postvoid or if no void in 6 hours --Avoid nephrotoxins, renally dose all medications --Daily BMP  Hyponatremia: Likely from hypovolemia, received 2.6 L normal saline bolus in ED. --Na 130-->135-->135-->136-->138 --gentle IVF hydration --Continue to monitor daily BMP  Hyperlipidemia: --continue home atorvastatin 40 mg daily  Hypothyroidism: --Continue Synthroid 25 mcg p.o. daily  Depression: --Lexapro 10 mg p.o. daily  Essential hypertension BP 172/84 this morning. --Start amlodipine 5 mg p.o. daily --IV hydralazine 10 mg every 6 hours as needed --Continue to monitor  blood pressure closely  Vascular Parkinsonism and dementia with behavioral disturbance Failure to thrive/progressive functional decline Patient with advanced vascular parkinsonism and dementia, now essentially nonverbal and unable to perform ADLs per wife.  Is able to ambulate some with the use of a walker otherwise quality of life is poor.  I do long discussion with his wife by phone who states that he has previously told his wife and daughter that in the event of cardiac arrest or significant respiratory distress he would not want to be resuscitated or have artificial means of nutrition. --Continue Nuplazid 34 mg daily --Palliative care following and will continue to follow outpatient    DVT prophylaxis:Eliquis  Code Status: DNR Family Communication: none Disposition Plan: PT/OT recommends SNF, likely will discharge home with home health services given his dementia and family wishes; plan discharge 06/25/2019   Consultants:   Palliative care  Procedures:  VAS Bilateral LE Duplex Ultrasound Summary: Right: There is no evidence of deep vein thrombosis in the lower extremity. Left: Findings consistent with acute deep vein thrombosis involving the left common femoral vein, left popliteal vein, and left peroneal veins. Mobile thrombus noted in the left common femoral and external iliac veins  Transthoracic echocardiogram: 7/12 IMPRESSIONS   1. The left ventricle has normal systolic function with an ejection fraction of 60-65%. The cavity size was normal. Left ventricular diastolic Doppler parameters are consistent with impaired relaxation.  2. The right ventricle has normal systolic function. The cavity was normal. There is no increase  in right ventricular wall thickness.  3. The aortic valve is tricuspid. Mild thickening of the aortic valve. Mild calcification of the aortic valve.  MBSS: Pt presents with mild oropharyngeal dysphagia c/b nonrotary mastication, decreased lingual  coordination, and delayed swallow initiation.  These deficits resulted in impaired bolus formation and delayed A-P transit, swallow initiation at the vallecula with solids, and piecemeal deglutition.  Pharyngeal residue was cleared with spontaneous subsequent swallows.  There was no penetration or aspiration with any consistencies.  Pt was noted to cough during evaluation in absence of penetration/aspiration. Recommend regular texture diet with thin liquid.  Antimicrobials:   Vancomycin 7/11 - 7/13  Zosyn 7/11 - 7/13  Azithromycin 7/13>>  Ceftriaxone 7/13>>   Subjective:  Patient seen and examined at bedside, pleasantly confused.  Renal function now back to normal.  Transition to Eliquis yesterday.  Continues with Foley catheter for obstructive uropathy, will attempt voiding trial today. No other complaints today from patient.  No family present at bedside, but updated wife via telephone this morning about events and plan of care.  Denies headache, no shortness of breath, no chest pain, no abdominal pain.  No other acute concerns overnight per nursing staff.  Objective: Vitals:   06/24/19 0437 06/24/19 0505 06/24/19 0802 06/24/19 1147  BP: (!) 184/108 140/70 (!) 157/81 (!) 172/84  Pulse: 90 87 74 80  Resp: (!) 28 (!) 23 (!) 23 17  Temp:   97.8 F (36.6 C) (!) 97.5 F (36.4 C)  TempSrc:   Oral Oral  SpO2: 98% 96% 99% 98%  Weight:      Height:        Intake/Output Summary (Last 24 hours) at 06/24/2019 1209 Last data filed at 06/24/2019 0801 Gross per 24 hour  Intake 2573.76 ml  Output 5775 ml  Net -3201.24 ml   Filed Weights   06/22/19 0402 06/23/19 0440 06/24/19 0412  Weight: 87.9 kg 85.5 kg 85.2 kg    Examination:  General exam: Appears calm and comfortable, pleasantly confused Respiratory system: Coarse breath sounds bilaterally, slightly decreased bilateral bases, no wheezing, normal respiratory effort, on room air Cardiovascular system: S1 & S2 heard, RRR. No JVD,  murmurs, rubs, gallops or clicks. No pedal edema. Gastrointestinal system: Abdomen soft and nontender.  Nondistended.  No organomegaly or masses felt. Normal bowel sounds heard. GU: Foley catheter noted, slightly blood-tinged urine Central nervous system: Alert, pleasantly confused, no focal neurological deficits Extremities: Symmetric 5 x 5 power. Skin: No rashes, lesions or ulcers Psychiatry: Pleasantly confused, normal mood/affect    Data Reviewed: I have personally reviewed following labs and imaging studies  CBC: Recent Labs  Lab 06/19/19 2225 06/20/19 0353 06/21/19 0344 06/22/19 0548 06/23/19 0423 06/24/19 0327  WBC 14.4* 16.0* 15.7* 13.4* 10.2 8.7  NEUTROABS 9.1*  --   --   --   --   --   HGB 13.6 12.0* 11.3* 11.1* 11.0* 11.1*  HCT 43.1 36.4* 33.9* 33.5* 32.8* 33.5*  MCV 97.1 93.1 91.9 92.8 91.9 92.3  PLT 157 121* 125* 128* 147* 073   Basic Metabolic Panel: Recent Labs  Lab 06/20/19 0353 06/21/19 0344 06/21/19 0902 06/22/19 0548 06/23/19 0423 06/24/19 0327  NA 135 135  --  136 138 138  K 3.6 3.9  --  3.9 3.4* 3.7  CL 104 103  --  107 106 106  CO2 22 18*  --  18* 21* 22  GLUCOSE 136* 115*  --  108* 102* 127*  BUN 25* 42*  --  48* 25* 16  CREATININE 1.42* 2.87* 3.31* 4.28* 1.51* 1.04  CALCIUM 8.1* 8.5*  --  8.2* 8.7* 8.8*  MG  --  1.7  --   --   --   --    GFR: Estimated Creatinine Clearance: 60.1 mL/min (by C-G formula based on SCr of 1.04 mg/dL). Liver Function Tests: Recent Labs  Lab 06/19/19 2225  AST 24  ALT 29  ALKPHOS 96  BILITOT 0.9  PROT 5.8*  ALBUMIN 3.1*   No results for input(s): LIPASE, AMYLASE in the last 168 hours. No results for input(s): AMMONIA in the last 168 hours. Coagulation Profile: Recent Labs  Lab 06/19/19 2225 06/20/19 0353  INR 1.1 1.4*   Cardiac Enzymes: No results for input(s): CKTOTAL, CKMB, CKMBINDEX, TROPONINI in the last 168 hours. BNP (last 3 results) No results for input(s): PROBNP in the last 8760 hours.  HbA1C: No results for input(s): HGBA1C in the last 72 hours. CBG: No results for input(s): GLUCAP in the last 168 hours. Lipid Profile: No results for input(s): CHOL, HDL, LDLCALC, TRIG, CHOLHDL, LDLDIRECT in the last 72 hours. Thyroid Function Tests: No results for input(s): TSH, T4TOTAL, FREET4, T3FREE, THYROIDAB in the last 72 hours. Anemia Panel: No results for input(s): VITAMINB12, FOLATE, FERRITIN, TIBC, IRON, RETICCTPCT in the last 72 hours. Sepsis Labs: Recent Labs  Lab 06/19/19 2225 06/20/19 0040 06/20/19 0353 06/21/19 0344 06/22/19 0548  PROCALCITON  --   --  15.51 22.33 8.55  LATICACIDVEN 4.1* 1.6  --   --   --     Recent Results (from the past 240 hour(s))  Blood culture (routine x 2)     Status: None (Preliminary result)   Collection Time: 06/19/19 10:10 PM   Specimen: BLOOD RIGHT FOREARM  Result Value Ref Range Status   Specimen Description BLOOD RIGHT FOREARM  Final   Special Requests   Final    BOTTLES DRAWN AEROBIC AND ANAEROBIC Blood Culture adequate volume   Culture   Final    NO GROWTH 4 DAYS Performed at [facility], [place] [address], [place], [place] [address]    Report Status PENDING  Incomplete  Urine culture     Status: None   Collection Time: 06/19/19 10:28 PM   Specimen: Urine, Clean Catch  Result Value Ref Range Status   Specimen Description URINE, CLEAN CATCH  Final   Special Requests NONE  Final   Culture   Final    NO GROWTH Performed at [facility], [place] [address] [place], [place] [address]    Report Status 06/21/2019 FINAL  Final  SARS Coronavirus 2 (CEPHEID- Performed in [place] hospital lab), Hosp Order     Status: None   Collection Time: 06/19/19 10:28 PM   Specimen: Urine, Catheterized; Nasopharyngeal  Result Value Ref Range Status   SARS Coronavirus 2 NEGATIVE NEGATIVE Final    Comment: (NOTE) If result is NEGATIVE SARS-CoV-2 target nucleic acids are NOT DETECTED. The SARS-CoV-2 RNA is generally detectable  in upper and lower  respiratory specimens during the acute phase of infection. The lowest  concentration of SARS-CoV-2 viral copies this assay can detect is 250  copies / mL. A negative result does not preclude SARS-CoV-2 infection  and should not be used as the sole basis for treatment or other  patient management decisions.  A negative result may occur with  improper specimen collection / handling, submission of specimen other  than nasopharyngeal swab, presence of viral mutation(s) within the  areas targeted by this assay, and inadequate number of viral copies  (<250 copies / mL). A negative result must be combined with clinical  observations, patient history, and epidemiological information. If result is POSITIVE SARS-CoV-2 target nucleic acids are DETECTED. The SARS-CoV-2 RNA is generally detectable in upper and lower  respiratory specimens dur ing the acute phase of infection.  Positive  results are indicative of active infection with SARS-CoV-2.  Clinical  correlation with patient history and other diagnostic information is  necessary to determine patient infection status.  Positive results do  not rule out bacterial infection or co-infection with other viruses. If result is PRESUMPTIVE POSTIVE SARS-CoV-2 nucleic acids MAY BE PRESENT.   A presumptive positive result was obtained on the submitted specimen  and confirmed on repeat testing.  While 2019 novel coronavirus  (SARS-CoV-2) nucleic acids may be present in the submitted sample  additional confirmatory testing may be necessary for epidemiological  and / or clinical management purposes  to differentiate between  SARS-CoV-2 and other Sarbecovirus currently known to infect humans.  If clinically indicated additional testing with an alternate test  methodology [phone]) is advised. The SARS-CoV-2 RNA is generally  detectable in upper and lower respiratory sp ecimens during the acute  phase of infection. The expected result is  Negative. Fact Sheet for Patients:  StrictlyIdeas.no Fact Sheet for Healthcare Providers: BankingDealers.co.za This test is not yet approved or cleared by the [place] FDA and has been authorized for detection and/or diagnosis of SARS-CoV-2 by FDA under an Emergency Use Authorization (EUA).  This EUA will remain in effect (meaning this test can be used) for the duration of the COVID-19 declaration under Section 564(b)(1) of the Act, 21 U.S.C. section 360bbb-3(b)(1), unless the authorization is terminated or revoked sooner. Performed at [facility], [place] [address], [place], [place] [address]   Blood culture (routine x 2)     Status: None (Preliminary result)   Collection Time: 06/19/19 10:32 PM   Specimen: BLOOD  Result Value Ref Range Status   Specimen Description BLOOD LEFT ANTECUBITAL  Final   Special Requests   Final    BOTTLES DRAWN AEROBIC AND ANAEROBIC Blood Culture adequate volume   Culture   Final    NO GROWTH 4 DAYS Performed at [facility], [place] [address], [place], [place] [address]    Report Status PENDING  Incomplete  MRSA PCR Screening     Status: None   Collection Time: 06/21/19  8:33 AM   Specimen: Nasal Mucosa; Nasopharyngeal  Result Value Ref Range Status   MRSA by PCR NEGATIVE NEGATIVE Final    Comment:        The GeneXpert MRSA Assay (FDA approved for NASAL specimens only), is one component of a comprehensive MRSA colonization surveillance program. It is not intended to diagnose MRSA infection nor to guide or monitor treatment for MRSA infections. Performed at [facility], [place] [address] [place], [place] [address]          Radiology Studies: US Renal  Result Date: 06/22/2019 CLINICAL DATA:  Acute renal failure.  Hypertension. EXAM: RENAL / URINARY TRACT ULTRASOUND COMPLETE COMPARISON:  None. FINDINGS: Right Kidney: Renal measurements: 13.4 x 5.3 x 5.7 cm = volume: 211 mL.  Echogenicity is within normal limits. Cortex is mildly thinned circumferentially. No mass or hydronephrosis visualized. Left Kidney: Renal measurements: 12 x 6 x 4.4 cm = volume: 167 mL. Cortical echogenicity and thickness is within normal limits. No mass  seen. There is hydronephrosis, mild to moderate in degree. Bladder: Not seen, decompressed by Foley catheter. Incidental note made of bilateral pleural effusions. IMPRESSION: 1. LEFT-sided hydronephrosis, mild to moderate in degree. 2. RIGHT renal cortex is mildly thinned circumferentially. RIGHT kidney otherwise unremarkable. No hydronephrosis. 3. Bladder decompressed by Foley catheter. 4. Bilateral pleural effusions. Electronically Signed   By: Franki Cabot M.D.   On: 06/22/2019 14:24        Scheduled Meds: . amLODipine  5 mg Oral Daily  . apixaban  10 mg Oral BID   Followed by  . [START ON 07/01/2019] apixaban  5 mg Oral BID  . feeding supplement (ENSURE ENLIVE)  237 mL Oral BID BM  . levothyroxine  25 mcg Oral Q0600  . mouth rinse  15 mL Mouth Rinse BID  . sodium chloride flush  3 mL Intravenous Q12H  . tamsulosin  0.4 mg Oral Daily   Continuous Infusions: . sodium chloride 75 mL/hr at 06/24/19 0630  . azithromycin Stopped (06/23/19 1438)  . cefTRIAXone (ROCEPHIN)  IV Stopped (06/23/19 1305)     LOS: 4 days    Time spent: 47 minutes    Reannah Totten J [place] DO [facility] Pager [phone]  If 7PM-7AM, please contact night-coverage [email or web address] Password [facility] 06/24/2019, 12:09 PM

## 2019-06-24 NOTE — Plan of Care (Signed)
  Problem: Clinical Measurements: Goal: Diagnostic test results will improve Outcome: Progressing   

## 2019-06-25 DIAGNOSIS — N139 Obstructive and reflux uropathy, unspecified: Secondary | ICD-10-CM | POA: Diagnosis present

## 2019-06-25 LAB — BASIC METABOLIC PANEL
Anion gap: 9 (ref 5–15)
BUN: 11 mg/dL (ref 8–23)
CO2: 21 mmol/L — ABNORMAL LOW (ref 22–32)
Calcium: 8.6 mg/dL — ABNORMAL LOW (ref 8.9–10.3)
Chloride: 105 mmol/L (ref 98–111)
Creatinine, Ser: 0.81 mg/dL (ref 0.61–1.24)
GFR calc Af Amer: >60 mL/min (ref >60)
GFR calc non Af Amer: >60 mL/min (ref >60)
Glucose, Bld: 122 mg/dL — ABNORMAL HIGH (ref 70–99)
Potassium: 3.4 mmol/L — ABNORMAL LOW (ref 3.5–5.1)
Sodium: 135 mmol/L (ref 135–145)

## 2019-06-25 MED ORDER — POTASSIUM CHLORIDE CRYS ER 20 MEQ PO TBCR
40.0000 meq | EXTENDED_RELEASE_TABLET | Freq: Once | ORAL | Status: AC
  Administered 2019-06-25: 40 meq via ORAL
  Filled 2019-06-25: qty 2

## 2019-06-25 MED ORDER — ELIQUIS 5 MG VTE STARTER PACK: ORAL_TABLET | ORAL | 0 refills | Status: AC

## 2019-06-25 MED ORDER — TAMSULOSIN HCL 0.4 MG PO CAPS: 0.4000 mg | ORAL_CAPSULE | Freq: Every day | ORAL | 0 refills | Status: AC

## 2019-06-25 MED ORDER — CEFDINIR 300 MG PO CAPS: 300.0000 mg | ORAL_CAPSULE | Freq: Two times a day (BID) | ORAL | 0 refills | Status: AC

## 2019-06-25 NOTE — Progress Notes (Signed)
Physical Therapy Treatment Patient Details Name: Christian Wiley MRN: [id number] DOB: 1935-01-19 Today's Date: 06/25/2019    History of Present Illness Christian Wiley is a 84 y.o. male with medical history significant for vascular dementia, Parkinson's disease, hypothyroidism, GERD, hyperlipidemia, BPH, RBBB, history of PE not on anticoagulation, AAA status post endovascular repair, depression who presents to the ED for evaluation of altered mental status after a fall.   Pt with sepsis with PNA, Bil PE,  and LLE DVT.     PT Comments    Pt requires max encouragement to participate and attempts to lay back down often during session.  Pt able to partially stand with max A but unable to come fully upright. Still recommend SNF due to high level of care needed.   Follow Up Recommendations  SNF;Supervision/Assistance - 24 hour     Equipment Recommendations       Recommendations for Other Services       Precautions / Restrictions Precautions Precautions: Fall Restrictions Weight Bearing Restrictions: No    Mobility  Bed Mobility     Rolling: Min assist Sidelying to sit: Mod assist Supine to sit: Mod assist Sit to supine: Min assist   General bed mobility comments: pt requires assist for trunk and LEs  Transfers   Equipment used: Rolling walker (2 wheeled) Transfers: Sit to/from Stand Sit to Stand: Max assist;From elevated surface         General transfer comment: attempted x 3 to perform sit <> stand from elevated bed. pt able to get partially up but unable to fully extend trunk and knees due to pelvic anterior tilt.  pt continues to keep attempting to lay down in between trials, requires max encouragement to participate  Ambulation/Gait                 Stairs             Wheelchair Mobility    Modified Rankin (Stroke Patients Only)       Balance       Sitting balance - Comments: initially pt requires max A for sitting balance, progresses to  min guard with cues for UE/LE placement                                    Cognition Arousal/Alertness: Awake/alert Behavior During Therapy: WFL for tasks assessed/performed Overall Cognitive Status: History of cognitive impairments - at baseline                                        Exercises      General Comments        Pertinent Vitals/Pain Faces Pain Scale: No hurt    Home Living                      Prior Function            PT Goals (current goals can now be found in the care plan section) Acute Rehab PT Goals Patient Stated Goal: unable to state Time For Goal Achievement: 07/05/19 Potential to Achieve Goals: Good Progress towards PT goals: Progressing toward goals    Frequency    Min 3X/week      PT Plan Current plan remains appropriate    Co-evaluation  AM-PAC PT "6 Clicks" Mobility   Outcome Measure  Help needed turning from your back to your side while in a flat bed without using bedrails?: A Lot Help needed moving from lying on your back to sitting on the side of a flat bed without using bedrails?: A Lot Help needed moving to and from a bed to a chair (including a wheelchair)?: Total Help needed standing up from a chair using your arms (e.g., wheelchair or bedside chair)?: Total Help needed to walk in hospital room?: Total Help needed climbing 3-5 steps with a railing? : Total 6 Click Score: 8    End of Session Equipment Utilized During Treatment: Gait belt Activity Tolerance: Patient limited by fatigue Patient left: in bed;with call bell/phone within reach;with bed alarm set Nurse Communication: Mobility status PT Visit Diagnosis: Unsteadiness on feet (R26.81);Muscle weakness (generalized) (M62.81)     Time: 8502-7741 PT Time Calculation (min) (ACUTE ONLY): 15 min  Charges:  $Therapeutic Activity: 8-22 mins                     Isabelle Course, PT,  DPT   Christian Wiley 06/25/2019, 9:48 AM

## 2019-06-25 NOTE — Discharge Instructions (Signed)
Community-Acquired Pneumonia, Adult Pneumonia is an infection of the lungs. It causes swelling in the airways of the lungs. Mucus and fluid may also build up inside the airways. One type of pneumonia can happen while a person is in a hospital. A different type can happen when a person is not in a hospital (community-acquired pneumonia).  What are the causes?  This condition is caused by germs (viruses, bacteria, or fungi). Some types of germs can be passed from one person to another. This can happen when you breathe in droplets from the cough or sneeze of an infected person. What increases the risk? You are more likely to develop this condition if you:  Have a long-term (chronic) disease, such as: ? Chronic obstructive pulmonary disease (COPD). ? Asthma. ? Cystic fibrosis. ? Congestive heart failure. ? Diabetes. ? Kidney disease.  Have HIV.  Have sickle cell disease.  Have had your spleen removed.  Do not take good care of your teeth and mouth (poor dental hygiene).  Have a medical condition that increases the risk of breathing in droplets from your own mouth and nose.  Have a weakened body defense system (immune system).  Are a smoker.  Travel to areas where the germs that cause this illness are common.  Are around certain animals or the places they live. What are the signs or symptoms?  A dry cough.  A wet (productive) cough.  Fever.  Sweating.  Chest pain. This often happens when breathing deeply or coughing.  Fast breathing or trouble breathing.  Shortness of breath.  Shaking chills.  Feeling tired (fatigue).  Muscle aches. How is this treated? Treatment for this condition depends on many things. Most adults can be treated at home. In some cases, treatment must happen in a hospital. Treatment may include:  Medicines given by mouth or through an IV tube.  Being given extra oxygen.  Respiratory therapy. In rare cases, treatment for very bad  pneumonia may include:  Using a machine to help you breathe.  Having a procedure to remove fluid from around your lungs. Follow these instructions at home: Medicines  Take over-the-counter and prescription medicines only as told by your doctor. ? Only take cough medicine if you are losing sleep.  If you were prescribed an antibiotic medicine, take it as told by your doctor. Do not stop taking the antibiotic even if you start to feel better. General instructions   Sleep with your head and neck raised (elevated). You can do this by sleeping in a recliner or by putting a few pillows under your head.  Rest as needed. Get at least 8 hours of sleep each night.  Drink enough water to keep your pee (urine) pale yellow.  Eat a healthy diet that includes plenty of vegetables, fruits, whole grains, low-fat dairy products, and lean protein.  Do not use any products that contain nicotine or tobacco. These include cigarettes, e-cigarettes, and chewing tobacco. If you need help quitting, ask your doctor.  Keep all follow-up visits as told by your doctor. This is important. How is this prevented? A shot (vaccine) can help prevent pneumonia. Shots are often suggested for:  People older than 82 years of age.  People older than 82 years of age who: ? Are having cancer treatment. ? Have long-term (chronic) lung disease. ? Have problems with their body's defense system. You may also prevent pneumonia if you take these actions:  Get the flu (influenza) shot every year.  Go to the dentist  as often as told.  Wash your hands often. If you cannot use soap and water, use hand sanitizer. Contact a doctor if:  You have a fever.  You lose sleep because your cough medicine does not help. Get help right away if:  You are short of breath and it gets worse.  You have more chest pain.  Your sickness gets worse. This is very serious if: ? You are an older adult. ? Your body's defense system is  weak.  You cough up blood. Summary  Pneumonia is an infection of the lungs.  Most adults can be treated at home. Some will need treatment in a hospital.  Drink enough water to keep your pee pale yellow.  Get at least 8 hours of sleep each night. This information is not intended to replace advice given to you by your health care provider. Make sure you discuss any questions you have with your health care provider. Document Released: 05/13/2008 Document Revised: 03/17/2019 Document Reviewed: 07/23/2018 Elsevier Patient Education  [place]. Pulmonary Embolism  A pulmonary embolism (PE) is a sudden blockage or decrease of blood flow in one or both lungs. Most blockages come from a blood clot that forms in the vein of a lower leg, thigh, or arm (deep vein thrombosis, DVT) and travels to the lungs. A clot is blood that has thickened into a gel or solid. PE is a dangerous and life-threatening condition that needs to be treated right away. What are the causes? This condition is usually caused by a blood clot that forms in a vein and moves to the lungs. In rare cases, it may be caused by air, fat, part of a tumor, or other tissue that moves through the veins and into the lungs. What increases the risk? The following factors may make you more likely to develop this condition:  Experiencing a traumatic injury, such as breaking a hip or leg.  Having: ? A spinal cord injury. ? Orthopedic surgery, especially hip or knee replacement. ? Any major surgery. ? A stroke. ? DVT. ? Blood clots or blood clotting disease. ? Long-term (chronic) lung or heart disease. ? Cancer treated with chemotherapy. ? A central venous catheter.  Taking medicines that contain estrogen. These include birth control pills and hormone replacement therapy.  Being: ? Pregnant. ? In the period of time after your baby is delivered (postpartum). ? Older than age 44. ? Overweight. ? A smoker, especially if you  have other risks. What are the signs or symptoms? Symptoms of this condition usually start suddenly and include:  Shortness of breath during activity or at rest.  Coughing, coughing up blood, or coughing up blood-tinged mucus.  Chest pain that is often worse with deep breaths.  Rapid or irregular heartbeat.  Feeling light-headed or dizzy.  Fainting.  Feeling anxious.  Fever.  Sweating.  Pain and swelling in a leg. This is a symptom of DVT, which can lead to PE. How is this diagnosed? This condition may be diagnosed based on:  Your medical history.  A physical exam.  Blood tests.  CT pulmonary angiogram. This test checks blood flow in and around your lungs.  Ventilation-perfusion scan, also called a lung VQ scan. This test measures air flow and blood flow to the lungs.  An ultrasound of the legs. How is this treated? Treatment for this condition depends on many factors, such as the cause of your PE, your risk for bleeding or developing more clots, and other medical conditions  you have. Treatment aims to remove, dissolve, or stop blood clots from forming or growing larger. Treatment may include:  Medicines, such as: ? Blood thinning medicines (anticoagulants) to stop clots from forming. ? Medicines that dissolve clots (thrombolytics).  Procedures, such as: ? Using a flexible tube to remove a blood clot (embolectomy) or to deliver medicine to destroy it (catheter-directed thrombolysis). ? Inserting a filter into a large vein that carries blood to the heart (inferior vena cava). This filter (vena cava filter) catches blood clots before they reach the lungs. ? Surgery to remove the clot (surgical embolectomy). This is rare. You may need a combination of immediate, long-term (up to 3 months after diagnosis), and extended (more than 3 months after diagnosis) treatments. Your treatment may continue for several months (maintenance therapy). You and your health care provider  will work together to choose the treatment program that is best for you. Follow these instructions at home: Medicines  Take over-the-counter and prescription medicines only as told by your health care provider.  If you are taking an anticoagulant medicine: ? Take the medicine every day at the same time each day. ? Understand what foods and drugs interact with your medicine. ? Understand the side effects of this medicine, including excessive bruising or bleeding. Ask your health care provider or pharmacist about other side effects. General instructions  Wear a medical alert bracelet or carry a medical alert card that says you have had a PE and lists what medicines you take.  Ask your health care provider when you may return to your normal activities. Avoid sitting or lying for a long time without moving.  Maintain a healthy weight. Ask your health care provider what weight is healthy for you.  Do not use any products that contain nicotine or tobacco, such as cigarettes, e-cigarettes, and chewing tobacco. If you need help quitting, ask your health care provider.  Talk with your health care provider about any travel plans. It is important to make sure that you are still able to take your medicine while on trips.  Keep all follow-up visits as told by your health care provider. This is important. Contact a health care provider if:  You missed a dose of your blood thinner medicine. Get help right away if:  You have: ? New or increased pain, swelling, warmth, or redness in an arm or leg. ? Numbness or tingling in an arm or leg. ? Shortness of breath during activity or at rest. ? A fever. ? Chest pain. ? A rapid or irregular heartbeat. ? A severe headache. ? Vision changes. ? A serious fall or accident, or you hit your head. ? Stomach (abdominal) pain. ? Blood in your vomit, stool, or urine. ? A cut that will not stop bleeding.  You cough up blood.  You feel light-headed or  dizzy.  You cannot move your arms or legs.  You are confused or have memory loss. These symptoms may represent a serious problem that is an emergency. Do not wait to see if the symptoms will go away. Get medical help right away. Call your local emergency services (911 in the [place] Do not drive yourself to the hospital. Summary  A pulmonary embolism (PE) is a sudden blockage or decrease of blood flow in one or both lungs. PE is a dangerous and life-threatening condition that needs to be treated right away.  Treatments for this condition usually include medicines to thin your blood (anticoagulants) or medicines to break apart blood clots (  thrombolytics).  If you are given blood thinners, it is important to take the medicine every day at the same time each day.  Understand what foods and drugs interact with any medicines that you are taking.  If you have signs of PE or DVT, call your local emergency services (911 in the [place] This information is not intended to replace advice given to you by your health care provider. Make sure you discuss any questions you have with your health care provider. Document Released: 11/22/2000 Document Revised: 09/02/2018 Document Reviewed: 09/02/2018 Elsevier Patient Education  2020 [place], Adult An indwelling urinary catheter is a thin tube that is put into your bladder. The tube helps to drain pee (urine) out of your body. The tube goes in through your urethra. Your urethra is where pee comes out of your body. Your pee will come out through the catheter, then it will go into a bag (drainage bag). Take good care of your catheter so it will work well. How to wear your catheter and bag Supplies needed  Sticky tape (adhesive tape) or a leg strap.  Alcohol wipe or soap and water (if you use tape).  A clean towel (if you use tape).  Large overnight bag.  Smaller bag (leg bag). Wearing your catheter Attach your  catheter to your leg with tape or a leg strap.  Make sure the catheter is not pulled tight.  If a leg strap gets wet, take it off and put on a dry strap.  If you use tape to hold the bag on your leg: 1. Use an alcohol wipe or soap and water to wash your skin where the tape made it sticky before. 2. Use a clean towel to pat-dry that skin. 3. Use new tape to make the bag stay on your leg. Wearing your bags You should have been given a large overnight bag.  You may wear the overnight bag in the day or night.  Always have the overnight bag lower than your bladder.  Do not let the bag touch the floor.  Before you go to sleep, put a clean plastic bag in a wastebasket. Then hang the overnight bag inside the wastebasket. You should also have a smaller leg bag that fits under your clothes.  Always wear the leg bag below your knee.  Do not wear your leg bag at night. How to care for your skin and catheter Supplies needed  A clean washcloth.  Water and mild soap.  A clean towel. Caring for your skin and catheter      Clean the skin around your catheter every day: ? Wash your hands with soap and water. ? Wet a clean washcloth in warm water and mild soap. ? Clean the skin around your urethra. ? If you are male: ? Gently spread the folds of skin around your vagina (labia). ? With the washcloth in your other hand, wipe the inner side of your labia on each side. Wipe from front to back. ? If you are male: ? Pull back any skin that covers the end of your penis (foreskin). ? With the washcloth in your other hand, wipe your penis in small circles. Start wiping at the tip of your penis, then move away from the catheter. ? Move the foreskin back in place, if needed. ? With your free hand, hold the catheter close to where it goes into your body. ? Keep holding the catheter during cleaning so it does not  get pulled out. ? With the washcloth in your other hand, clean the catheter. ? Only  wipe downward on the catheter. ? Do not wipe upward toward your body. Doing this may push germs into your urethra and cause infection. ? Use a clean towel to pat-dry the catheter and the skin around it. Make sure to wipe off all soap. ? Wash your hands with soap and water.  Shower every day. Do not take baths.  Do not use cream, ointment, or lotion on the area where the catheter goes into your body, unless your doctor tells you to.  Do not use powders, sprays, or lotions on your genital area.  Check your skin around the catheter every day for signs of infection. Check for: ? Redness, swelling, or pain. ? Fluid or blood. ? Warmth. ? Pus or a bad smell. How to empty the bag Supplies needed  Rubbing alcohol.  Gauze pad or cotton ball.  Tape or a leg strap. Emptying the bag Pour the pee out of your bag when it is ?- full, or at least 2-3 times a day. Do this for your overnight bag and your leg bag. 1. Wash your hands with soap and water. 2. Separate (detach) the bag from your leg. 3. Hold the bag over the toilet or a clean pail. Keep the bag lower than your hips and bladder. This is so the pee (urine) does not go back into the tube. 4. Open the pour spout. It is at the bottom of the bag. 5. Empty the pee into the toilet or pail. Do not let the pour spout touch any surface. 6. Put rubbing alcohol on a gauze pad or cotton ball. 7. Use the gauze pad or cotton ball to clean the pour spout. 8. Close the pour spout. 9. Attach the bag to your leg with tape or a leg strap. 10. Wash your hands with soap and water. Follow instructions for cleaning the drainage bag:  From the product maker.  As told by your doctor. How to change the bag Supplies needed  Alcohol wipes.  A clean bag.  Tape or a leg strap. Changing the bag Replace your bag when it starts to leak, smell bad, or look dirty. 1. Wash your hands with soap and water. 2. Separate the dirty bag from your leg. 3. Pinch the  catheter with your fingers so that pee does not spill out. 4. Separate the catheter tube from the bag tube where these tubes connect (at the connection valve). Do not let the tubes touch any surface. 5. Clean the end of the catheter tube with an alcohol wipe. Use a different alcohol wipe to clean the end of the bag tube. 6. Connect the catheter tube to the tube of the clean bag. 7. Attach the clean bag to your leg with tape or a leg strap. Do not make the bag tight on your leg. 8. Wash your hands with soap and water. General rules   Never pull on your catheter. Never try to take it out. Doing that can hurt you.  Always wash your hands before and after you touch your catheter or bag. Use a mild, fragrance-free soap. If you do not have soap and water, use hand sanitizer.  Always make sure there are no twists or bends (kinks) in the catheter tube.  Always make sure there are no leaks in the catheter or bag.  Drink enough fluid to keep your pee pale yellow.  Do not take  baths, swim, or use a hot tub.  If you are male, wipe from front to back after you poop (have a bowel movement). Contact a doctor if:  Your pee is cloudy.  Your pee smells worse than usual.  Your catheter gets clogged.  Your catheter leaks.  Your bladder feels full. Get help right away if:  You have redness, swelling, or pain where the catheter goes into your body.  You have fluid, blood, pus, or a bad smell coming from the area where the catheter goes into your body.  Your skin feels warm where the catheter goes into your body.  You have a fever.  You have pain in your: ? Belly (abdomen). ? Legs. ? Lower back. ? Bladder.  You see blood in the catheter.  Your pee is pink or red.  You feel sick to your stomach (nauseous).  You throw up (vomit).  You have chills.  Your pee is not draining into the bag.  Your catheter gets pulled out. Summary  An indwelling urinary catheter is a thin tube  that is placed into the bladder to help drain pee (urine) out of the body.  The catheter is placed into the part of the body that drains pee from the bladder (urethra).  Taking good care of your catheter will keep it working properly and help prevent problems.  Always wash your hands before and after touching your catheter or bag.  Never pull on your catheter or try to take it out. This information is not intended to replace advice given to you by your health care provider. Make sure you discuss any questions you have with your health care provider. Document Released: 03/22/2013 Document Revised: 03/19/2019 Document Reviewed: 07/11/2017 Elsevier Patient Education  2020 [employer].

## 2019-06-25 NOTE — Progress Notes (Signed)
[facility] here to get pt. Foley emptied, belongings pack and sent with him. Wife, [name], was called and notified patient is on the way.

## 2019-06-25 NOTE — TOC Transition Note (Signed)
Transition of Care [facility]) - CM/SW Discharge Note Marvetta Gibbons RN, BSN Transitions of Care Unit 4E- RN Case Manager [phone]  Patient Details  Name: Christian Wiley MRN: [id number] Date of Birth: 1935/09/29  Transition of Care [facility]) CM/SW Contact:  Dawayne Patricia, RN Phone Number: 06/25/2019, 12:04 PM   Clinical Narrative:    Pt stable for transition home, orders placed for HHRN/PT/OT/SLP/aide- pt non verbal- call placed to pt's wife-Christian Wiley, per TC discussed transition needs and PT recommendations- wife continues to state she wants to try bringing pt home with [facility] as she feels pt will do best at home with his dementia. Choice provided to wife for [facility]- she states they have used [facility] in past and wants to use them again.She reports they have RW, BSC, and shower stool at home. Discussed ongoing PC with [facility]- which wife confirms she would like, pt will transport home via [place]. Wife also request info on private duty - info shared with wife. Call made to [facility] with [facility] for [facility] referral- referral has been accepted. Call also made to [facility] with [facility] for community PC follow up.   Final next level of care: Home w Home Health Services Barriers to Discharge: No Barriers Identified   Patient Goals and CMS Choice Patient states their goals for this hospitalization and ongoing recovery are:: "to hopefully get him back to walking with walker"(per wife) CMS Medicare.gov Compare Post Acute Care list provided to:: Patient Represenative (must comment)(wife) Choice offered to / list presented to : Spouse  Discharge Placement  Home with [facility]                     Discharge Plan and Services In-house Referral: [facility], Hospice / Palliative Care Discharge Planning Services: CM Consult Post Acute Care Choice: Home Health          DME Arranged: N/A DME Agency: NA       HH Arranged: RN, PT, OT, Nurse's Aide, Speech Therapy HH Agency: [place] Date [place]: 06/25/19 Time [place]: 1203 Representative spoke with at [place]: [place] ([place]) Interventions     Readmission Risk Interventions Readmission Risk Prevention Plan 06/25/2019  Transportation Screening Complete  PCP or Specialist Appt within 5-7 Days Complete  Home Care Screening Complete  Medication Review (RN CM) Complete

## 2019-06-25 NOTE — Discharge Summary (Signed)
Physician Discharge Summary  Christian Wiley UVO:[id number] DOB: 08-30-1935 DOA: 06/19/2019  PCP: [name], MD  Admit date: 06/19/2019 Discharge date: 06/25/2019  Admitted From: Home Disposition:  Home  Recommendations for Outpatient Follow-up:  1. Follow up with PCP in 1 week 2. Follow-up with urology, Dr. Junious Silk in 1-2 weeks for obstructive uropathy with Foley catheter in place 3. New start on tamsulosin 4. Started on Eliquis for acute pulmonary embolism/DVT  Home Health: Yes, PT/OT, RN Equipment/Devices: Foley catheter, placed 06/24/2019  Discharge Condition: Stable CODE STATUS: DNR Diet recommendation: Heart Healthy  History of present illness:  Arias Weinert Readlingis a 84 y.o.malewith medical history significant forvascular dementia, Parkinson's disease, hypothyroidism, GERD, hyperlipidemia, BPH, RBBB, history of PE not on anticoagulation,AAA status post endovascular repair,depression who presents to the ED for evaluationof altered mental status after a fall.Patient is unable to provide any history therefore entirety of history is obtained from EDP, chart review, and wife by phone.  Wife states that patient at baseline is nonverbal and only occasionally alert. He needs help with all of his ADLs but can walk with the use of a walker. She states that he seems to be choking every time he eats. She says that he has been treated for UTI with cefdinir completing day 6 of 7 so far.  She says today while walking with his walker he began to fall forwards. Luckily his wife was by and was unable to catch him and slowly sliding down to the ground. She says he did have a small abrasion to his elbow but did not hit his head. She called EMS and per EDP patient was noted to be hypoxic requiring nonrebreather and was brought to the ED for further evaluation.  ED Course:Initial vitals showed BP 133/55, pulse 88, RR 30, temp 101.8 Fahrenheit rectally, SPO2 97% on 4 L supplemental  O2 via [place].  Labs are notable for WBC 14.4, hemoglobin 13.6, platelets 157,000, sodium 130, potassium 4.5, bicarb 20, BUN 28, creatinine 1.5, serum glucose 150, high-sensitivity troponin IL-11, lactic acid 4.1, SARS-CoV-2 test negative.  Urinalysis showed negative nitrites, trace leukocytes, 21-50 RBC/hpf, 0-5 WBC /hpf, rare bacteria on microscopy.Blood cultures were drawn and pending.  Portable chest x-ray showed mild patchy bilateral lower lung opacities.  CT head without contrast was negative for acute intracranial abnormality. Atrophy and mild small vessel ischemic changes of the white matter was noted.  CTA chest PE studyshowed an acute PE involving the distal right main pulmonary artery with extension into the right lobar artery and upper and lower lobe segmental and subsegmental branch vessels. A small left lower lobe subsegmental PE was also seen. CT findings were positive for evidence of right heart strain consistent with at least submassive PE. Changes suggestive of multifocal pneumonia worse in the bilateral lower lobes was also seen.  Patient was given 2.6 L normal saline and started on IV vancomycin and Zosyn.PCCM were consulted who recommended hospitalist admission. The hospitalist service was consulted to admit for further evaluation and management.   Hospital course:  Submassive bilateral pulmonary embolism with right heart strain Acute DVT left common femoral vein, left popliteal vein, left peroneal vein With CT evidence of right heart strain. Patient has a history of PE and per wife was on Xarelto until 1 year ago which was discontinued due to a type II endoleak found on angiography seemingly resolution of the leak after holding anticoagulation.  Vascular ultrasound duplex bilateral lower extremities notable for acute DVT on left.  Transthoracic echocardiogram with  EF 09-98%, diastolic dysfunction, no mention of heart strain.  Initially pulmonary critical care  was consulted for consideration of catheter directed thrombolysis; but without right heart strain appreciated on echocardiogram recommended transition of IV heparin drip to Eliquis.  Patient's hemoglobin remained stable and tolerated anticoagulation while hospitalized.  He was saturating well on room air following discharge.  Patient will need follow-up with his PCP for further refills of his Eliquis.  Pneumonia, multifocal; possible aspiration Wife reports behavior suggestive of aspiration.  CT PA notable for multifocal pneumonia.  Procalcitonin elevated at 15.50 with a lactic acid of 1.6.  COVID-19 test negative.  Patient was started on broad-spectrum antibiotics with vancomycin and Zosyn initially and de-escalated to azithromycin and ceftriaxone.  He was evaluated by speech therapy with no signs of penetration on MBS S.  His blood cultures remain negative during the hospitalization and his white count improved to 8.7 at time of discharge.  Patient completed a 5-day course of azithromycin while inpatient, and will continue antibiotics with cefdinir 300 mg p.o. twice daily for an additional 5 days to complete a 10-day course.  Dysphasia with possible aspiration: Discussed with patient's spouse, [name] today regarding his issues on swallowing.  She has noticed that he has significant cough while eating that has been present over the past 2 weeks.  Concern for possible aspiration given pneumonia as above.  Discussed with her that if he is unable to eat or drink orally, that he has advanced directives that would decline use of artificial means with tube feeding. Speech therapy followed during the hospital course, no signs of aspiration on MBSS yesterday.  Continue aspiration precautions following discharge.  Acute kidney injury: Creatinine 1.50 on admission.  Etiology likely combination prerenal azotemia combined with obstructive uropathy.  Despite IV fluid hydration and treatment of his underlying pneumonia  as above, his creatinine continued to trend up to a high of 4.28.  Notable abdominal distention on physical exam with bladder scan with greater than 6 or 95 mL retained urine.  A Foley catheter was initially placed with return of 1500 mL's of urine.  He was started on tamsulosin 0.4 mg p.o. daily, continued on his finasteride.  Attempted voiding trial on 06/24/2019 unsuccessful with recurrent obstruction; which his Foley catheter was replaced on 06/24/2019.  Discussed with wife, will need follow-up with urology outpatient in 1-2 weeks.  We will continue Foley catheter outpatient.  Prescription sent for tamsulosin 0.4 mg p.o. daily.  Continue finasteride.  Creatinine at time of discharge 0.81.  Hyponatremia: Sodium 130 on admission.  Likely from hypovolemia, received 2.6 L normal saline bolus in ED. improved, will sodium 135 at time of discharge.  Continue to encourage increased oral intake.   Hyperlipidemia: continue home atorvastatin 40 mg daily  Hypothyroidism: Continue Synthroid 25 mcg p.o. daily  Depression: Lexapro 10 mg p.o. daily  Essential hypertension: Continue home Bystolic  Vascular Parkinsonismand dementia with behavioral disturbance Failure to thrive/progressive functional decline Patient with advanced vascular parkinsonism and dementia, now essentially nonverbal and unable to perform ADLs per wife. Is able to ambulate some with the use of a walker otherwise quality of life is poor. Discussion with his wife by phone who states that he has previously told his wife and daughter that in the event of cardiac arrest or significant respiratory distress he would not want to be resuscitated or have artificial means of nutrition. Continue Nuplazid 34 mg daily. Palliative care  will continue to follow patient outpatient.    Discharge Diagnoses:  Principal Problem:   Sepsis due to pneumonia [facility]) Active Problems:   Parkinson disease ([place])   Vascular dementia ([place])   Hypothyroidism    History of pulmonary embolus (PE)   Depression with anxiety   Hyperlipidemia   BPH (benign prostatic hyperplasia)   Acute pulmonary embolism (HCC)   Goals of care, counseling/discussion   Palliative care encounter   Acute renal failure (ARF) ([place])   Obstructive uropathy    Discharge Instructions  Discharge Instructions    Call MD for:  difficulty breathing, headache or visual disturbances   Complete by: As directed    Call MD for:  extreme fatigue   Complete by: As directed    Call MD for:  persistant dizziness or light-headedness   Complete by: As directed    Call MD for:  persistant nausea and vomiting   Complete by: As directed    Call MD for:  severe uncontrolled pain   Complete by: As directed    Call MD for:  temperature >100.4   Complete by: As directed    Diet - low sodium heart healthy   Complete by: As directed    Increase activity slowly   Complete by: As directed      Allergies as of 06/25/2019      Reactions   Ambien [zolpidem Tartrate] Other (See Comments)   Caused anxiety and confusion      Medication List    TAKE these medications   allopurinol 300 MG tablet Commonly known as: ZYLOPRIM Take 300 mg by mouth daily with breakfast.   ALPRAZolam 0.5 MG tablet Commonly known as: XANAX Take 0.5 mg by mouth daily as needed for anxiety.   atorvastatin 40 MG tablet Commonly known as: LIPITOR Take 40 mg by mouth at bedtime.   cefdinir 300 MG capsule Commonly known as: OMNICEF Take 1 capsule (300 mg total) by mouth 2 (two) times daily for 5 days.   Eliquis DVT/PE Starter Pack 5 MG Tabs Take as directed on package: start with two-5mg  tablets twice daily for 7 days. On day 8, switch to one-5mg  tablet twice daily.   escitalopram 10 MG tablet Commonly known as: LEXAPRO Take 10 mg by mouth daily with breakfast.   finasteride 5 MG tablet Commonly known as: PROSCAR Take 5 mg by mouth daily with breakfast.   levothyroxine 25 MCG tablet Commonly known  as: SYNTHROID Take 25 mcg by mouth daily before breakfast.   nebivolol 5 MG tablet Commonly known as: BYSTOLIC Take 5 mg by mouth daily with breakfast.   Nuplazid 34 MG Caps Generic drug: Pimavanserin Tartrate Take 34 mg by mouth at bedtime.   omeprazole 40 MG capsule Commonly known as: PRILOSEC Take 40 mg by mouth See admin instructions. Take one capsule (40 mg) by mouth every Monday, Wednesday, Friday nights   tamsulosin 0.4 MG Caps capsule Commonly known as: FLOMAX Take 1 capsule (0.4 mg total) by mouth daily. Start taking on: June 26, 2019      Follow-up Information    [name], MD. Schedule an appointment as soon as possible for a visit in 1 week(s).   Specialty: Internal Medicine Contact information: [place] [address] [phone]        Festus Aloe, MD. Schedule an appointment as soon as possible for a visit in 1 week(s).   Specialty: Urology Contact information: [address] [place] [address] [phone]          Allergies  Allergen Reactions  .  Ambien [Zolpidem Tartrate] Other (See Comments)    Caused anxiety and confusion    Consultations:  Palliative care   Procedures/Studies: Ct Head Wo Contrast  Result Date: 06/20/2019 CLINICAL DATA:  Altered LOC EXAM: CT HEAD WITHOUT CONTRAST TECHNIQUE: Contiguous axial images were obtained from the base of the skull through the vertex without intravenous contrast. COMPARISON:  None. FINDINGS: Brain: No acute territorial infarction, hemorrhage or intracranial mass. Moderate atrophy. Mild small vessel ischemic changes of the white matter. Prominent ventricles secondary to atrophy Vascular: No hyperdense vessels.  Carotid vascular calcification Skull: Normal. Negative for fracture or focal lesion. Sinuses/Orbits: Chronic appearing deformity of right maxillary sinus. Sinuses are clear Other: None IMPRESSION: 1. No CT evidence for acute intracranial abnormality. 2. Atrophy and  mild small vessel ischemic changes of the white matter Electronically Signed   By: Donavan Foil M.D.   On: 06/20/2019 00:40   Ct Angio Chest Pe W And/or Wo Contrast  Result Date: 06/20/2019 CLINICAL DATA:  Shortness of breath EXAM: CT ANGIOGRAPHY CHEST WITH CONTRAST TECHNIQUE: Multidetector CT imaging of the chest was performed using the standard protocol during bolus administration of intravenous contrast. Multiplanar CT image reconstructions and MIPs were obtained to evaluate the vascular anatomy. CONTRAST:  71mL OMNIPAQUE IOHEXOL 350 MG/ML SOLN COMPARISON:  Chest x-ray 06/19/2019 FINDINGS: Cardiovascular: Satisfactory opacification of the pulmonary arteries to the segmental level. Acute saddle embolus on the right, with thrombus visualized in the right upper lobe segmental branch vessels, distal right main pulmonary artery, descending right pulmonary artery and right lower lobe segmental and subsegmental branch vessels. Small nonocclusive emboli within left lower lobe subsegmental branch vessels. RV LV ratio is elevated at 1.3. Moderate aortic atherosclerosis without aneurysm. Coronary vascular calcification. Normal heart size. No pericardial effusion Mediastinum/Nodes: Midline trachea. No thyroid mass. No significant adenopathy. Mild distal esophageal thickening with hiatal hernia Lungs/Pleura: Consolidation and ground-glass density in the bilateral lower lobes. Central lobular foci of ground-glass density and nodularity within the bilateral upper lobes, also suspicious for pneumonia. No pleural effusion. Upper Abdomen: No acute abnormality. Musculoskeletal: Degenerative changes. No acute or suspicious abnormality. Review of the MIP images confirms the above findings. IMPRESSION: 1. Positive for acute pulmonary emboli involving the distal right main pulmonary artery with extension of thrombus into right inter lobar artery and upper and lower lobe segmental and subsegmental branch vessels. Small subsegmental  pulmonary emboli in the left lower lobe. Positive for acute PE with CT evidence of right heart strain (RV/LV Ratio = 1.3) consistent with at least submassive (intermediate risk) PE. The presence of right heart strain has been associated with an increased risk of morbidity and mortality. Please activate Code PE by paging [phone]. 2. Multifocal pneumonia, worst in the bilateral lower lobes. Critical Value/emergent results were called by telephone at the time of interpretation on 06/20/2019 at 12:52 am to Dr. Franchot Heidelberg , who verbally acknowledged these results. Aortic Atherosclerosis (ICD10-I70.0). Electronically Signed   By: Donavan Foil M.D.   On: 06/20/2019 00:52   US Renal  Result Date: 06/22/2019 CLINICAL DATA:  Acute renal failure.  Hypertension. EXAM: RENAL / URINARY TRACT ULTRASOUND COMPLETE COMPARISON:  None. FINDINGS: Right Kidney: Renal measurements: 13.4 x 5.3 x 5.7 cm = volume: 211 mL. Echogenicity is within normal limits. Cortex is mildly thinned circumferentially. No mass or hydronephrosis visualized. Left Kidney: Renal measurements: 12 x 6 x 4.4 cm = volume: 167 mL. Cortical echogenicity and thickness is within normal limits. No mass seen. There is hydronephrosis, mild to moderate  in degree. Bladder: Not seen, decompressed by Foley catheter. Incidental note made of bilateral pleural effusions. IMPRESSION: 1. LEFT-sided hydronephrosis, mild to moderate in degree. 2. RIGHT renal cortex is mildly thinned circumferentially. RIGHT kidney otherwise unremarkable. No hydronephrosis. 3. Bladder decompressed by Foley catheter. 4. Bilateral pleural effusions. Electronically Signed   By: Franki Cabot M.D.   On: 06/22/2019 14:24   Dg Chest Portable 1 View  Result Date: 06/19/2019 CLINICAL DATA:  Shortness of breath EXAM: PORTABLE CHEST 1 VIEW COMPARISON:  None. FINDINGS: Mild patchy lingular/left lower lobe opacity. Mild right basilar opacity. No frank interstitial edema.  No pleural effusion or  pneumothorax. The heart is normal in size. Degenerative changes of the thoracic spine. IMPRESSION: Mild patchy bilateral lower lung opacities, suspicious for pneumonia. Electronically Signed   By: Julian Hy M.D.   On: 06/19/2019 22:32   Dg Swallowing Func-speech Pathology  Result Date: 06/20/2019 Objective Swallowing Evaluation: Type of Study: Bedside Swallow Evaluation  Patient Details Name: KELE BARTHELEMY MRN: [id number] Date of Birth: Apr 24, 1935 Today's Date: 06/20/2019 Time: SLP Start Time (ACUTE ONLY): 1023 -SLP Stop Time (ACUTE ONLY): 7824 SLP Time Calculation (min) (ACUTE ONLY): 10 min Past Medical History: Past Medical History: Diagnosis Date . Abdominal aortic aneurysm, without rupture ([place])  . HTN (hypertension)  . Parkinson disease ([place])  . Vascular dementia [facility])  Past Surgical History: No past surgical history on file. HPI: Quinterious Walraven Lyne is a 84 y.o. male with medical history significant for vascular dementia, Parkinson's disease, hypothyroidism, GERD, hyperlipidemia, BPH, RBBB, history of PE not on anticoagulation, AAA status post endovascular repair, depression who presents to the ED for evaluation of altered mental status after a fall.  Pt is nonverbal at baseline. Head CT 7/12 with no acute findings.  CXR 7/11 with BLL opacities concerning for pneumonia.  Per chart review, pt has hx of aspiration as reported by wife, but there is no further information available regarding baseline swallow function.  Subjective: Pt awake, alert, pleasant, participative Assessment / Plan / Recommendation CHL IP CLINICAL IMPRESSIONS 06/20/2019 Clinical Impression Pt presents with mild oropharyngeal dysphagia c/b nonrotary mastication, decreased lingual coordination, and delayed swallow initiation.  These deficits resulted in impaired bolus formation and delayed A-P transit, swallow initiation at the vallecula with solids, and piecemeal deglutition.  Pharyngeal residue was cleared with spontaneous  subsequent swallows.  There was no penetration or aspiration with any consistencies.  Pt was noted to cough during evaluation in absence of penetration/aspiration. Recommend regular texture diet with thin liquid. SLP Visit Diagnosis -- Attention and concentration deficit following -- Frontal lobe and executive function deficit following -- Impact on safety and function --   CHL IP TREATMENT RECOMMENDATION 06/20/2019 Treatment Recommendations Therapy as outlined in treatment plan below   Prognosis 06/20/2019 Prognosis for Safe Diet Advancement Good Barriers to Reach Goals -- Barriers/Prognosis Comment -- CHL IP DIET RECOMMENDATION 06/20/2019 SLP Diet Recommendations Regular solids;Thin liquid Liquid Administration via Cup;Straw Medication Administration Whole meds with liquid Compensations Minimize environmental distractions;Slow rate;Small sips/bites Postural Changes Seated upright at 90 degrees   CHL IP OTHER RECOMMENDATIONS 06/20/2019 Recommended Consults -- Oral Care Recommendations Oral care BID Other Recommendations --   No flowsheet data found.  CHL IP FREQUENCY AND DURATION 06/20/2019 Speech Therapy Frequency (ACUTE ONLY) min 1 x/week Treatment Duration 1 week      CHL IP ORAL PHASE 06/20/2019 Oral Phase -- Oral - Pudding Teaspoon -- Oral - Pudding Cup -- Oral - Honey Teaspoon -- Oral - Honey Cup --  Oral - Nectar Teaspoon -- Oral - Nectar Cup -- Oral - Nectar Straw -- Oral - Thin Teaspoon -- Oral - Thin Cup WFL Oral - Thin Straw WFL Oral - Puree Delayed oral transit;Lingual pumping Oral - Mech Soft -- Oral - Regular Impaired mastication;Delayed oral transit Oral - Multi-Consistency -- Oral - Pill Lingual pumping Oral Phase - Comment --  CHL IP PHARYNGEAL PHASE 06/20/2019 Pharyngeal Phase Impaired Pharyngeal- Pudding Teaspoon -- Pharyngeal -- Pharyngeal- Pudding Cup -- Pharyngeal -- Pharyngeal- Honey Teaspoon -- Pharyngeal -- Pharyngeal- Honey Cup -- Pharyngeal -- Pharyngeal- Nectar Teaspoon -- Pharyngeal --  Pharyngeal- Nectar Cup -- Pharyngeal -- Pharyngeal- Nectar Straw -- Pharyngeal -- Pharyngeal- Thin Teaspoon -- Pharyngeal -- Pharyngeal- Thin Cup [facility] Pharyngeal Material does not enter airway Pharyngeal- Thin Straw WFL Pharyngeal Material does not enter airway Pharyngeal- Puree Delayed swallow initiation-vallecula Pharyngeal Material does not enter airway Pharyngeal- Mechanical Soft -- Pharyngeal -- Pharyngeal- Regular Delayed swallow initiation-vallecula Pharyngeal Material does not enter airway Pharyngeal- Multi-consistency -- Pharyngeal -- Pharyngeal- Pill Delayed swallow initiation-vallecula Pharyngeal Material does not enter airway Pharyngeal Comment --  CHL IP CERVICAL ESOPHAGEAL PHASE 06/20/2019 Cervical Esophageal Phase WFL Pudding Teaspoon -- Pudding Cup -- Honey Teaspoon -- Honey Cup -- Nectar Teaspoon -- Nectar Cup -- Nectar Straw -- Thin Teaspoon -- Thin Cup -- Thin Straw -- Puree -- Mechanical Soft -- Regular -- Multi-consistency -- Pill -- Cervical Esophageal Comment -- Celedonio Savage, MA, CCC-SLP Acute Rehabilitation Services Office: [phone] 06/20/2019, 11:05 AM              Vas [place] Lower Extremity Venous (dvt)  Result Date: 06/21/2019  Lower Venous Study Indications: Pulmonary embolism.  Comparison Study: No prior study on file for comparison. Performing Technologist: Sharion Dove RVS  Examination Guidelines: A complete evaluation includes B-mode imaging, spectral Doppler, color Doppler, and power Doppler as needed of all accessible portions of each vessel. Bilateral testing is considered an integral part of a complete examination. Limited examinations for reoccurring indications may be performed as noted.  +---------+---------------+---------+-----------+----------+-------+ RIGHT    CompressibilityPhasicitySpontaneityPropertiesSummary +---------+---------------+---------+-----------+----------+-------+ CFV      Full           Yes      Yes                           +---------+---------------+---------+-----------+----------+-------+ SFJ      Full                                                 +---------+---------------+---------+-----------+----------+-------+ FV Prox  Full                                                 +---------+---------------+---------+-----------+----------+-------+ FV Mid   Full                                                 +---------+---------------+---------+-----------+----------+-------+ FV DistalFull                                                 +---------+---------------+---------+-----------+----------+-------+  PFV      Full                                                 +---------+---------------+---------+-----------+----------+-------+ POP      Full           Yes      Yes                          +---------+---------------+---------+-----------+----------+-------+ PTV      Full                                                 +---------+---------------+---------+-----------+----------+-------+ PERO     Full                                                 +---------+---------------+---------+-----------+----------+-------+   +---------+---------------+---------+-----------+----------+------------------+ LEFT     CompressibilityPhasicitySpontaneityPropertiesSummary            +---------+---------------+---------+-----------+----------+------------------+ CFV      Partial                                      Mobile acute                                                             thrombus           +---------+---------------+---------+-----------+----------+------------------+ SFJ      Full                                                            +---------+---------------+---------+-----------+----------+------------------+ FV Prox  Full                                                             +---------+---------------+---------+-----------+----------+------------------+ FV Mid   Full                                                            +---------+---------------+---------+-----------+----------+------------------+ FV DistalFull                                                            +---------+---------------+---------+-----------+----------+------------------+  PFV      Full                                                            +---------+---------------+---------+-----------+----------+------------------+ POP      Partial        Yes      Yes                  Acute              +---------+---------------+---------+-----------+----------+------------------+ PTV      Full                                                            +---------+---------------+---------+-----------+----------+------------------+ PERO     None                                         Acute              +---------+---------------+---------+-----------+----------+------------------+ EIV      Partial                                      mobile acute       +---------+---------------+---------+-----------+----------+------------------+ CIV                     Yes      Yes                  distal             +---------+---------------+---------+-----------+----------+------------------+     Summary: Right: There is no evidence of deep vein thrombosis in the lower extremity. Left: Findings consistent with acute deep vein thrombosis involving the left common femoral vein, left popliteal vein, and left peroneal veins. Mobile thrombus noted in the left common femoral and external iliac veins  *See table(s) above for measurements and observations. Electronically signed by Monica Martinez MD on 06/21/2019 at 5:03:05 PM.    Final      VAS Bilateral LE Duplex Ultrasound Summary: Right: There is no evidence of deep vein thrombosis in the lower extremity. Left:  Findings consistent with acute deep vein thrombosis involving the left common femoral vein, left popliteal vein, and left peroneal veins. Mobile thrombus noted in the left common femoral and external iliac veins  Transthoracic echocardiogram: 7/12 IMPRESSIONS  1. The left ventricle has normal systolic function with an ejection fraction of 60-65%. The cavity size was normal. Left ventricular diastolic Doppler parameters are consistent with impaired relaxation. 2. The right ventricle has normal systolic function. The cavity was normal. There is no increase in right ventricular wall thickness. 3. The aortic valve is tricuspid. Mild thickening of the aortic valve. Mild calcification of the aortic valve.  MBSS: Pt presents with mild oropharyngeal dysphagia c/b nonrotary mastication, decreased lingual coordination, and delayed swallow initiation. These deficits resulted in impaired bolus formation and delayed A-P transit, swallow initiation at the vallecula  with solids, and piecemeal deglutition. Pharyngeal residue was cleared with spontaneous subsequent swallows. There was no penetration or aspiration with any consistencies. Pt was noted to cough during evaluation in absence of penetration/aspiration. Recommend regular texture diet with thin liquid.  Subjective: Patient seen and examined at bedside, resting comfortably.  Pleasantly confused.  No complaints.  Eating breakfast.  Updated wife via telephone this morning regarding discharge plans today.  No concerns.  Patient denies headache, no fever/chills/night sweats, no chest pain, no palpitations, no abdominal pain.  No acute events overnight per nursing staff.   Discharge Exam: Vitals:   06/25/19 0427 06/25/19 0717  BP: (!) 172/90 (!) 163/63  Pulse: 89 90  Resp: 19 (!) 30  Temp: 98.3 F (36.8 C) 97.7 F (36.5 C)  SpO2: 97% 97%   Vitals:   06/24/19 1938 06/25/19 0017 06/25/19 0427 06/25/19 0717  BP: 131/72 (!) 143/81 (!) 172/90 (!)  163/63  Pulse: 77 65 89 90  Resp: (!) 25 (!) 23 19 (!) 30  Temp: 97.8 F (36.6 C) 98.1 F (36.7 C) 98.3 F (36.8 C) 97.7 F (36.5 C)  TempSrc: Oral Oral Oral Oral  SpO2: 98% 96% 97% 97%  Weight:   83.9 kg   Height:        General: Pt is alert, awake, not in acute distress, pleasantly confused Cardiovascular: RRR, S1/S2 +, no rubs, no gallops Respiratory: CTA bilaterally, no wheezing, no rhonchi Abdominal: Soft, NT, ND, bowel sounds + GU: Foley catheter noted in place Extremities: no edema, no cyanosis    The results of significant diagnostics from this hospitalization (including imaging, microbiology, ancillary and laboratory) are listed below for reference.     Microbiology: Recent Results (from the past 240 hour(s))  Blood culture (routine x 2)     Status: None   Collection Time: 06/19/19 10:10 PM   Specimen: BLOOD RIGHT FOREARM  Result Value Ref Range Status   Specimen Description BLOOD RIGHT FOREARM  Final   Special Requests   Final    BOTTLES DRAWN AEROBIC AND ANAEROBIC Blood Culture adequate volume   Culture   Final    NO GROWTH 5 DAYS Performed at [facility], [address], [place], [place] [address]    Report Status 06/24/2019 FINAL  Final  Urine culture     Status: None   Collection Time: 06/19/19 10:28 PM   Specimen: Urine, Clean Catch  Result Value Ref Range Status   Specimen Description URINE, CLEAN CATCH  Final   Special Requests NONE  Final   Culture   Final    NO GROWTH Performed at [facility], [place] [address], [place], [place] [address]    Report Status 06/21/2019 FINAL  Final  SARS Coronavirus 2 (CEPHEID- Performed in [place] hospital lab), Hosp Order     Status: None   Collection Time: 06/19/19 10:28 PM   Specimen: Urine, Catheterized; Nasopharyngeal  Result Value Ref Range Status   SARS Coronavirus 2 NEGATIVE NEGATIVE Final    Comment: (NOTE) If result is NEGATIVE SARS-CoV-2 target nucleic acids are NOT DETECTED. The  SARS-CoV-2 RNA is generally detectable in upper and lower  respiratory specimens during the acute phase of infection. The lowest  concentration of SARS-CoV-2 viral copies this assay can detect is 250  copies / mL. A negative result does not preclude SARS-CoV-2 infection  and should not be used as the sole basis for treatment or other  patient management decisions.  A negative result may  occur with  improper specimen collection / handling, submission of specimen other  than nasopharyngeal swab, presence of viral mutation(s) within the  areas targeted by this assay, and inadequate number of viral copies  (<250 copies / mL). A negative result must be combined with clinical  observations, patient history, and epidemiological information. If result is POSITIVE SARS-CoV-2 target nucleic acids are DETECTED. The SARS-CoV-2 RNA is generally detectable in upper and lower  respiratory specimens dur ing the acute phase of infection.  Positive  results are indicative of active infection with SARS-CoV-2.  Clinical  correlation with patient history and other diagnostic information is  necessary to determine patient infection status.  Positive results do  not rule out bacterial infection or co-infection with other viruses. If result is PRESUMPTIVE POSTIVE SARS-CoV-2 nucleic acids MAY BE PRESENT.   A presumptive positive result was obtained on the submitted specimen  and confirmed on repeat testing.  While 2019 novel coronavirus  (SARS-CoV-2) nucleic acids may be present in the submitted sample  additional confirmatory testing may be necessary for epidemiological  and / or clinical management purposes  to differentiate between  SARS-CoV-2 and other Sarbecovirus currently known to infect humans.  If clinically indicated additional testing with an alternate test  methodology [phone]) is advised. The SARS-CoV-2 RNA is generally  detectable in upper and lower respiratory sp ecimens during the acute   phase of infection. The expected result is Negative. Fact Sheet for Patients:  StrictlyIdeas.no Fact Sheet for Healthcare Providers: BankingDealers.co.za This test is not yet approved or cleared by the [place] FDA and has been authorized for detection and/or diagnosis of SARS-CoV-2 by FDA under an Emergency Use Authorization (EUA).  This EUA will remain in effect (meaning this test can be used) for the duration of the COVID-19 declaration under Section 564(b)(1) of the Act, 21 U.S.C. section 360bbb-3(b)(1), unless the authorization is terminated or revoked sooner. Performed at [facility], [place] [address] [place], [place] [address]   Blood culture (routine x 2)     Status: None   Collection Time: 06/19/19 10:32 PM   Specimen: BLOOD  Result Value Ref Range Status   Specimen Description BLOOD LEFT ANTECUBITAL  Final   Special Requests   Final    BOTTLES DRAWN AEROBIC AND ANAEROBIC Blood Culture adequate volume   Culture   Final    NO GROWTH 5 DAYS Performed at [facility], [place] [address] [place], [place] [address]    Report Status 06/24/2019 FINAL  Final  MRSA PCR Screening     Status: None   Collection Time: 06/21/19  8:33 AM   Specimen: Nasal Mucosa; Nasopharyngeal  Result Value Ref Range Status   MRSA by PCR NEGATIVE NEGATIVE Final    Comment:        The GeneXpert MRSA Assay (FDA approved for NASAL specimens only), is one component of a comprehensive MRSA colonization surveillance program. It is not intended to diagnose MRSA infection nor to guide or monitor treatment for MRSA infections. Performed at [facility], [place] [address] [place],  [address]      Labs: BNP (last 3 results) No results for input(s): BNP in the last 8760 hours. Basic Metabolic Panel: Recent Labs  Lab 06/21/19 0344 06/21/19 0902 06/22/19 0548 06/23/19 0423 06/24/19 0327 06/25/19 0403  NA 135  --  136 138  138 135  K 3.9  --  3.9 3.4* 3.7 3.4*  CL 103  --  107 106  106 105  CO2 18*  --  18* 21* 22 21*  GLUCOSE 115*  --  108* 102* 127* 122*  BUN 42*  --  48* 25* 16 11  CREATININE 2.87* 3.31* 4.28* 1.51* 1.04 0.81  CALCIUM 8.5*  --  8.2* 8.7* 8.8* 8.6*  MG 1.7  --   --   --   --   --    Liver Function Tests: Recent Labs  Lab 06/19/19 2225  AST 24  ALT 29  ALKPHOS 96  BILITOT 0.9  PROT 5.8*  ALBUMIN 3.1*   No results for input(s): LIPASE, AMYLASE in the last 168 hours. No results for input(s): AMMONIA in the last 168 hours. CBC: Recent Labs  Lab 06/19/19 2225 06/20/19 0353 06/21/19 0344 06/22/19 0548 06/23/19 0423 06/24/19 0327  WBC 14.4* 16.0* 15.7* 13.4* 10.2 8.7  NEUTROABS 9.1*  --   --   --   --   --   HGB 13.6 12.0* 11.3* 11.1* 11.0* 11.1*  HCT 43.1 36.4* 33.9* 33.5* 32.8* 33.5*  MCV 97.1 93.1 91.9 92.8 91.9 92.3  PLT 157 121* 125* 128* 147* 150   Cardiac Enzymes: No results for input(s): CKTOTAL, CKMB, CKMBINDEX, TROPONINI in the last 168 hours. BNP: Invalid input(s): POCBNP CBG: No results for input(s): GLUCAP in the last 168 hours. D-Dimer No results for input(s): DDIMER in the last 72 hours. Hgb A1c No results for input(s): HGBA1C in the last 72 hours. Lipid Profile No results for input(s): CHOL, HDL, LDLCALC, TRIG, CHOLHDL, LDLDIRECT in the last 72 hours. Thyroid function studies No results for input(s): TSH, T4TOTAL, T3FREE, THYROIDAB in the last 72 hours.  Invalid input(s): FREET3 Anemia work up No results for input(s): VITAMINB12, FOLATE, FERRITIN, TIBC, IRON, RETICCTPCT in the last 72 hours. Urinalysis    Component Value Date/Time   COLORURINE YELLOW 06/19/2019 2228   APPEARANCEUR HAZY (A) 06/19/2019 2228   LABSPEC 1.021 06/19/2019 2228   PHURINE 5.0 06/19/2019 2228   GLUCOSEU 50 (A) 06/19/2019 2228   HGBUR MODERATE (A) 06/19/2019 2228   BILIRUBINUR NEGATIVE 06/19/2019 2228   KETONESUR 5 (A) 06/19/2019 2228   PROTEINUR NEGATIVE 06/19/2019  2228   NITRITE NEGATIVE 06/19/2019 2228   LEUKOCYTESUR TRACE (A) 06/19/2019 2228   Sepsis Labs Invalid input(s): PROCALCITONIN,  WBC,  LACTICIDVEN Microbiology Recent Results (from the past 240 hour(s))  Blood culture (routine x 2)     Status: None   Collection Time: 06/19/19 10:10 PM   Specimen: BLOOD RIGHT FOREARM  Result Value Ref Range Status   Specimen Description BLOOD RIGHT FOREARM  Final   Special Requests   Final    BOTTLES DRAWN AEROBIC AND ANAEROBIC Blood Culture adequate volume   Culture   Final    NO GROWTH 5 DAYS Performed at [facility], [address], [place], [place] [address]    Report Status 06/24/2019 FINAL  Final  Urine culture     Status: None   Collection Time: 06/19/19 10:28 PM   Specimen: Urine, Clean Catch  Result Value Ref Range Status   Specimen Description URINE, CLEAN CATCH  Final   Special Requests NONE  Final   Culture   Final    NO GROWTH Performed at [facility], [place] [address] [place],  [address]    Report Status 06/21/2019 FINAL  Final  SARS Coronavirus 2 (CEPHEID- Performed in [place] hospital lab), Hosp Order     Status: None   Collection Time: 06/19/19 10:28 PM  Specimen: Urine, Catheterized; Nasopharyngeal  Result Value Ref Range Status   SARS Coronavirus 2 NEGATIVE NEGATIVE Final    Comment: (NOTE) If result is NEGATIVE SARS-CoV-2 target nucleic acids are NOT DETECTED. The SARS-CoV-2 RNA is generally detectable in upper and lower  respiratory specimens during the acute phase of infection. The lowest  concentration of SARS-CoV-2 viral copies this assay can detect is 250  copies / mL. A negative result does not preclude SARS-CoV-2 infection  and should not be used as the sole basis for treatment or other  patient management decisions.  A negative result may occur with  improper specimen collection / handling, submission of specimen other  than nasopharyngeal swab, presence of viral mutation(s) within the   areas targeted by this assay, and inadequate number of viral copies  (<250 copies / mL). A negative result must be combined with clinical  observations, patient history, and epidemiological information. If result is POSITIVE SARS-CoV-2 target nucleic acids are DETECTED. The SARS-CoV-2 RNA is generally detectable in upper and lower  respiratory specimens dur ing the acute phase of infection.  Positive  results are indicative of active infection with SARS-CoV-2.  Clinical  correlation with patient history and other diagnostic information is  necessary to determine patient infection status.  Positive results do  not rule out bacterial infection or co-infection with other viruses. If result is PRESUMPTIVE POSTIVE SARS-CoV-2 nucleic acids MAY BE PRESENT.   A presumptive positive result was obtained on the submitted specimen  and confirmed on repeat testing.  While 2019 novel coronavirus  (SARS-CoV-2) nucleic acids may be present in the submitted sample  additional confirmatory testing may be necessary for epidemiological  and / or clinical management purposes  to differentiate between  SARS-CoV-2 and other Sarbecovirus currently known to infect humans.  If clinically indicated additional testing with an alternate test  methodology [phone]) is advised. The SARS-CoV-2 RNA is generally  detectable in upper and lower respiratory sp ecimens during the acute  phase of infection. The expected result is Negative. Fact Sheet for Patients:  StrictlyIdeas.no Fact Sheet for Healthcare Providers: BankingDealers.co.za This test is not yet approved or cleared by the [place] FDA and has been authorized for detection and/or diagnosis of SARS-CoV-2 by FDA under an Emergency Use Authorization (EUA).  This EUA will remain in effect (meaning this test can be used) for the duration of the COVID-19 declaration under Section 564(b)(1) of the Act, 21  U.S.C. section 360bbb-3(b)(1), unless the authorization is terminated or revoked sooner. Performed at [facility], [place] [address] [place], [place] [address]   Blood culture (routine x 2)     Status: None   Collection Time: 06/19/19 10:32 PM   Specimen: BLOOD  Result Value Ref Range Status   Specimen Description BLOOD LEFT ANTECUBITAL  Final   Special Requests   Final    BOTTLES DRAWN AEROBIC AND ANAEROBIC Blood Culture adequate volume   Culture   Final    NO GROWTH 5 DAYS Performed at [facility], [place] [address] [place], [place] [address]    Report Status 06/24/2019 FINAL  Final  MRSA PCR Screening     Status: None   Collection Time: 06/21/19  8:33 AM   Specimen: Nasal Mucosa; Nasopharyngeal  Result Value Ref Range Status   MRSA by PCR NEGATIVE NEGATIVE Final    Comment:        The GeneXpert MRSA Assay (FDA approved for NASAL specimens only), is one component of a  comprehensive MRSA colonization surveillance program. It is not intended to diagnose MRSA infection nor to guide or monitor treatment for MRSA infections. Performed at [facility], [place] [address], [place], [place] [address]      Time coordinating discharge: Over 30 minutes  SIGNED:   Donnamarie Poag [place] DO  [facility] 06/25/2019, 9:05 AM

## 2019-06-25 NOTE — Progress Notes (Signed)
   06/25/19 1056  Clinical Encounter Type  Visited With Patient  Visit Type Initial  Referral From Palliative care team  Consult/Referral To Chaplain  This chaplain responded to referral from [facility] for Pt. spiritual care.  The chaplain talked to the Pt. RN-Brooke before the visit.  The RN recommended not talking about the Pt. discharge today.  The Pt. politely greeted the chaplain and agreed to the chaplain's presence.  The Pt. stated he is comfortable today and waiting for the Pt. wife-Jane to arrive.  The Pt. added after being married for 63 yrs he doesn't get impatient.  The chaplain listened to the Pt. and offered prayer after hearing the Pt. recognition of God's presence in his life.  The Pt. preferred private prayer by the chaplain.  The chaplain is available for F/U spiritual care as needed.

## 2019-06-27 DIAGNOSIS — E039 Hypothyroidism, unspecified: Secondary | ICD-10-CM | POA: Diagnosis not present

## 2019-06-27 DIAGNOSIS — G629 Polyneuropathy, unspecified: Secondary | ICD-10-CM | POA: Diagnosis not present

## 2019-06-27 DIAGNOSIS — G4730 Sleep apnea, unspecified: Secondary | ICD-10-CM | POA: Diagnosis not present

## 2019-06-27 DIAGNOSIS — Z96653 Presence of artificial knee joint, bilateral: Secondary | ICD-10-CM | POA: Diagnosis not present

## 2019-06-27 DIAGNOSIS — M1A9XX0 Chronic gout, unspecified, without tophus (tophi): Secondary | ICD-10-CM | POA: Diagnosis not present

## 2019-06-27 DIAGNOSIS — R351 Nocturia: Secondary | ICD-10-CM | POA: Diagnosis not present

## 2019-06-27 DIAGNOSIS — E663 Overweight: Secondary | ICD-10-CM | POA: Diagnosis not present

## 2019-06-27 DIAGNOSIS — Z9089 Acquired absence of other organs: Secondary | ICD-10-CM | POA: Diagnosis not present

## 2019-06-27 DIAGNOSIS — F329 Major depressive disorder, single episode, unspecified: Secondary | ICD-10-CM | POA: Diagnosis not present

## 2019-06-27 DIAGNOSIS — G20 Parkinson's disease: Secondary | ICD-10-CM | POA: Diagnosis not present

## 2019-06-27 DIAGNOSIS — Z955 Presence of coronary angioplasty implant and graft: Secondary | ICD-10-CM | POA: Diagnosis not present

## 2019-06-27 DIAGNOSIS — K219 Gastro-esophageal reflux disease without esophagitis: Secondary | ICD-10-CM | POA: Diagnosis not present

## 2019-06-27 DIAGNOSIS — F1320 Sedative, hypnotic or anxiolytic dependence, uncomplicated: Secondary | ICD-10-CM | POA: Diagnosis not present

## 2019-06-27 DIAGNOSIS — R7302 Impaired glucose tolerance (oral): Secondary | ICD-10-CM | POA: Diagnosis not present

## 2019-06-27 DIAGNOSIS — Z9049 Acquired absence of other specified parts of digestive tract: Secondary | ICD-10-CM | POA: Diagnosis not present

## 2019-06-27 DIAGNOSIS — I10 Essential (primary) hypertension: Secondary | ICD-10-CM | POA: Diagnosis not present

## 2019-06-27 DIAGNOSIS — M1990 Unspecified osteoarthritis, unspecified site: Secondary | ICD-10-CM | POA: Diagnosis not present

## 2019-06-27 DIAGNOSIS — Z87891 Personal history of nicotine dependence: Secondary | ICD-10-CM | POA: Diagnosis not present

## 2019-06-27 DIAGNOSIS — G959 Disease of spinal cord, unspecified: Secondary | ICD-10-CM | POA: Diagnosis not present

## 2019-06-27 DIAGNOSIS — N138 Other obstructive and reflux uropathy: Secondary | ICD-10-CM | POA: Diagnosis not present

## 2019-06-27 DIAGNOSIS — M4726 Other spondylosis with radiculopathy, lumbar region: Secondary | ICD-10-CM | POA: Diagnosis not present

## 2019-06-27 DIAGNOSIS — K5909 Other constipation: Secondary | ICD-10-CM | POA: Diagnosis not present

## 2019-06-27 DIAGNOSIS — N401 Enlarged prostate with lower urinary tract symptoms: Secondary | ICD-10-CM | POA: Diagnosis not present

## 2019-06-27 DIAGNOSIS — I951 Orthostatic hypotension: Secondary | ICD-10-CM | POA: Diagnosis not present

## 2019-06-27 DIAGNOSIS — E785 Hyperlipidemia, unspecified: Secondary | ICD-10-CM | POA: Diagnosis not present

## 2019-06-30 ENCOUNTER — Encounter (HOSPITAL_COMMUNITY): Payer: Self-pay

## 2019-06-30 DIAGNOSIS — E039 Hypothyroidism, unspecified: Secondary | ICD-10-CM | POA: Diagnosis not present

## 2019-06-30 DIAGNOSIS — N179 Acute kidney failure, unspecified: Secondary | ICD-10-CM | POA: Diagnosis not present

## 2019-06-30 DIAGNOSIS — E871 Hypo-osmolality and hyponatremia: Secondary | ICD-10-CM | POA: Diagnosis not present

## 2019-06-30 DIAGNOSIS — J189 Pneumonia, unspecified organism: Secondary | ICD-10-CM | POA: Diagnosis not present

## 2019-06-30 DIAGNOSIS — I2699 Other pulmonary embolism without acute cor pulmonale: Secondary | ICD-10-CM | POA: Diagnosis not present

## 2019-06-30 DIAGNOSIS — F329 Major depressive disorder, single episode, unspecified: Secondary | ICD-10-CM | POA: Diagnosis not present

## 2019-06-30 DIAGNOSIS — I82402 Acute embolism and thrombosis of unspecified deep veins of left lower extremity: Secondary | ICD-10-CM | POA: Diagnosis not present

## 2019-06-30 DIAGNOSIS — I10 Essential (primary) hypertension: Secondary | ICD-10-CM | POA: Diagnosis not present

## 2019-06-30 DIAGNOSIS — G20 Parkinson's disease: Secondary | ICD-10-CM | POA: Diagnosis not present

## 2019-06-30 DIAGNOSIS — E785 Hyperlipidemia, unspecified: Secondary | ICD-10-CM | POA: Diagnosis not present

## 2019-07-05 ENCOUNTER — Emergency Department (HOSPITAL_COMMUNITY): Payer: PPO

## 2019-07-05 ENCOUNTER — Encounter (HOSPITAL_COMMUNITY): Payer: Self-pay | Admitting: Emergency Medicine

## 2019-07-05 ENCOUNTER — Other Ambulatory Visit: Payer: Self-pay

## 2019-07-05 ENCOUNTER — Emergency Department (HOSPITAL_COMMUNITY)
Admission: EM | Admit: 2019-07-05 | Discharge: 2019-07-06 | Disposition: A | Discharge status: Home / Self Care | Payer: PPO | Attending: Emergency Medicine | Admitting: Emergency Medicine

## 2019-07-05 DIAGNOSIS — G20 Parkinson's disease: Secondary | ICD-10-CM | POA: Diagnosis not present

## 2019-07-05 DIAGNOSIS — J9811 Atelectasis: Secondary | ICD-10-CM | POA: Diagnosis not present

## 2019-07-05 DIAGNOSIS — E039 Hypothyroidism, unspecified: Secondary | ICD-10-CM | POA: Diagnosis not present

## 2019-07-05 DIAGNOSIS — I959 Hypotension, unspecified: Secondary | ICD-10-CM | POA: Diagnosis not present

## 2019-07-05 DIAGNOSIS — Z87891 Personal history of nicotine dependence: Secondary | ICD-10-CM | POA: Diagnosis not present

## 2019-07-05 DIAGNOSIS — I4510 Unspecified right bundle-branch block: Secondary | ICD-10-CM | POA: Diagnosis not present

## 2019-07-05 DIAGNOSIS — R531 Weakness: Secondary | ICD-10-CM | POA: Insufficient documentation

## 2019-07-05 DIAGNOSIS — F0390 Unspecified dementia without behavioral disturbance: Secondary | ICD-10-CM | POA: Diagnosis not present

## 2019-07-05 DIAGNOSIS — I10 Essential (primary) hypertension: Secondary | ICD-10-CM | POA: Diagnosis not present

## 2019-07-05 DIAGNOSIS — Z79899 Other long term (current) drug therapy: Secondary | ICD-10-CM | POA: Insufficient documentation

## 2019-07-05 DIAGNOSIS — Z96653 Presence of artificial knee joint, bilateral: Secondary | ICD-10-CM | POA: Insufficient documentation

## 2019-07-05 DIAGNOSIS — R911 Solitary pulmonary nodule: Secondary | ICD-10-CM | POA: Diagnosis not present

## 2019-07-05 DIAGNOSIS — F0150 Vascular dementia without behavioral disturbance: Secondary | ICD-10-CM | POA: Diagnosis not present

## 2019-07-05 DIAGNOSIS — R404 Transient alteration of awareness: Secondary | ICD-10-CM | POA: Diagnosis not present

## 2019-07-05 DIAGNOSIS — Z7901 Long term (current) use of anticoagulants: Secondary | ICD-10-CM | POA: Diagnosis not present

## 2019-07-05 DIAGNOSIS — R069 Unspecified abnormalities of breathing: Secondary | ICD-10-CM | POA: Diagnosis not present

## 2019-07-05 DIAGNOSIS — R0602 Shortness of breath: Secondary | ICD-10-CM | POA: Diagnosis not present

## 2019-07-05 LAB — CBC WITH DIFFERENTIAL/PLATELET
Abs Immature Granulocytes: 0.08 10*3/uL — ABNORMAL HIGH (ref 0.00–0.07)
Basophils Absolute: 0 10*3/uL (ref 0.0–0.1)
Basophils Relative: 0 %
Eosinophils Absolute: 0.1 10*3/uL (ref 0.0–0.5)
Eosinophils Relative: 1 %
HCT: 38.3 % — ABNORMAL LOW (ref 39.0–52.0)
Hemoglobin: 12.5 g/dL — ABNORMAL LOW (ref 13.0–17.0)
Immature Granulocytes: 1 %
Lymphocytes Relative: 36 %
Lymphs Abs: 3.8 10*3/uL (ref 0.7–4.0)
MCH: 30.6 pg (ref 26.0–34.0)
MCHC: 32.6 g/dL (ref 30.0–36.0)
MCV: 93.9 fL (ref 80.0–100.0)
Monocytes Absolute: 0.5 10*3/uL (ref 0.1–1.0)
Monocytes Relative: 5 %
Neutro Abs: 6.1 10*3/uL (ref 1.7–7.7)
Neutrophils Relative %: 57 %
Platelets: 245 10*3/uL (ref 150–400)
RBC: 4.08 MIL/uL — ABNORMAL LOW (ref 4.22–5.81)
RDW: 14.6 % (ref 11.5–15.5)
WBC: 10.5 10*3/uL (ref 4.0–10.5)
nRBC: 0 % (ref 0.0–0.2)

## 2019-07-05 LAB — URINALYSIS, ROUTINE W REFLEX MICROSCOPIC
Bacteria, UA: NONE SEEN
Bilirubin Urine: NEGATIVE
Glucose, UA: 50 mg/dL — AB
Ketones, ur: NEGATIVE mg/dL
Leukocytes,Ua: NEGATIVE
Nitrite: NEGATIVE
Protein, ur: NEGATIVE mg/dL
Specific Gravity, Urine: 1.013 (ref 1.005–1.030)
pH: 6 (ref 5.0–8.0)

## 2019-07-05 LAB — BASIC METABOLIC PANEL
Anion gap: 10 (ref 5–15)
BUN: 23 mg/dL (ref 8–23)
CO2: 23 mmol/L (ref 22–32)
Calcium: 8.8 mg/dL — ABNORMAL LOW (ref 8.9–10.3)
Chloride: 99 mmol/L (ref 98–111)
Creatinine, Ser: 1.06 mg/dL (ref 0.61–1.24)
GFR calc Af Amer: >60 mL/min (ref >60)
GFR calc non Af Amer: >60 mL/min (ref >60)
Glucose, Bld: 134 mg/dL — ABNORMAL HIGH (ref 70–99)
Potassium: 3.9 mmol/L (ref 3.5–5.1)
Sodium: 132 mmol/L — ABNORMAL LOW (ref 135–145)

## 2019-07-05 LAB — TROPONIN I (HIGH SENSITIVITY)
Troponin I (High Sensitivity): 9 ng/L (ref <18)
Troponin I (High Sensitivity): 10 ng/L (ref <18)

## 2019-07-05 MED ORDER — IOHEXOL 350 MG/ML SOLN
100.0000 mL | Freq: Once | INTRAVENOUS | Status: AC | PRN
  Administered 2019-07-05: 19:00:00 100 mL via INTRAVENOUS

## 2019-07-05 MED ORDER — SODIUM CHLORIDE (PF) 0.9 % IJ SOLN
INTRAMUSCULAR | Status: AC
  Administered 2019-07-05: 19:00:00
  Filled 2019-07-05: qty 50

## 2019-07-05 NOTE — ED Triage Notes (Signed)
Patient arrived by EMS from home. Pt doesn't have any complaints per EMS.  Patients wife had concerns for SOB while receiving physical therapy.   Pt is on blood thinners for previous blood clots. Pt has finished antibiotics for pneumonia.

## 2019-07-05 NOTE — ED Notes (Addendum)
Pt aware that urine sample is needed. Pt stated he "went awhile ago" and doesn't need to go right now. Bed in locked and lowest position and call bell at bedside

## 2019-07-05 NOTE — ED Notes (Signed)
Patient was given urinal to provide sample.

## 2019-07-05 NOTE — ED Notes (Signed)
No urine produced at this time

## 2019-07-05 NOTE — Discharge Instructions (Addendum)
You were seen in the ED today for shortness of breath  Your labwork, CXR, and CT can were all very reassuring Your CT scan did show a 2.1 cm pulmonary nodule; recommend repeat CT scan outpatient to ensure resolution (this may be chronic in nature too) We have placed a foley catheter back in as you were unable to void on your own  Please follow up with your PCP

## 2019-07-05 NOTE — ED Notes (Signed)
Pt unable to void via condom cath. PA at bedside and aware. Verbal order to placed foley back in.

## 2019-07-05 NOTE — ED Notes (Signed)
Pt's wife called inquiring about if pt had been picked up by [facility] yet.  Advised that he is still here and ETA is unknown.  Wife requested that someone call her when they do arrive to pick him up.

## 2019-07-05 NOTE — ED Provider Notes (Signed)
Care assumed from [facility], [place], at shift change, please see their notes for full documentation of patient's complaint/HPI. Briefly, pt here with complaints of shortness of breath while doing physical therapy this morning. Pt recently discharged from the hospital after sepsis secondary to PNA and UTI and is undergoing PT after being unable to ambulate at baseline since being in the hospital. Results so far show no leukocytosis, stable hemoglobin at 12.5, negative CXR, and initial trop of 9. Awaiting repeat trop as well as CTA, pt had a PE recently although he is currently on Eliquis. Plan is to discharge home if everything is negative.     Physical Exam  BP 129/80   Pulse 73   Temp 97.6 F (36.4 C) (Oral)   Resp 15   Ht 6' (1.829 m)   Wt 83.9 kg   SpO2 98%   BMI 25.09 kg/m   Physical Exam Vitals signs and nursing note reviewed.  Constitutional:      Appearance: He is not ill-appearing.  HENT:     Head: Normocephalic and atraumatic.  Eyes:     Conjunctiva/sclera: Conjunctivae normal.  Cardiovascular:     Rate and Rhythm: Normal rate and regular rhythm.  Pulmonary:     Effort: Pulmonary effort is normal.     Breath sounds: Normal breath sounds.  Skin:    General: Skin is warm and dry.     Coloration: Skin is not jaundiced.  Neurological:     Mental Status: He is alert.     ED Course/Procedures   Clinical Course as of Jul 04 1658  Mon Jul 05, 2019  1406 WBCs are within normal limits.   WBC: 10.5 [AH]  1406 Low hemoglobin noted at 12.5. This appears to have improved compared to previous values.  Hemoglobin(!): 12.5 [AH]  1429 No active disease.  DG Chest Port 1 View [AH]  1503 UA reveals RBCs, small Hgb, and glucose.   Urinalysis, Routine w reflex microscopic(!) [AH]  1637 Initial troponin is 9.  Troponin I (High Sensitivity) [AH]  1637 Called and updated wife. Wife states patient was supposed to have catheter removed Friday by home health nurse, but it was not  removed because nurse did not have the replacement catheter if patient failed voiding trial. Will remove catheter in the ER and do a voiding trial.    [AH]    Clinical Course User Index [AH] Arville Lime, PA-C    Procedures  MDM  Repeat trop initially unchanged. CTA negative for PE; does show improving PNA and previous pulmonary emboli. Pt does have a nodule in the left lung base; discussed with wife who reports this is chronic. Recommends repeat CT scan in the near future. Pt given a void trial in the ED;  Unable to void on own even with condom cath in placed. Placed another foley cath. Pt has home health to follow and change as needed. Pt stable for discharge home at this time. Discussed with wife who will follow up with PCP.       Eustaquio Maize, PA-C 07/06/19 0048    Hayden Rasmussen, MD 07/06/19 1320

## 2019-07-05 NOTE — ED Notes (Addendum)
PA aware of bladder volume

## 2019-07-05 NOTE — ED Notes (Signed)
This writer attempted to have patient urinate on his own. Patient has been unsuccessful w/ urination.

## 2019-07-05 NOTE — ED Notes (Signed)
Condom cath placed on pt with verbal from PA. Drainage place secured and placed below bladder

## 2019-07-05 NOTE — ED Provider Notes (Addendum)
[facility] DEPT Provider Note   CSN: [id number] Arrival date & time: 07/05/19  1239    History   Chief Complaint Chief Complaint  Patient presents with  . Shortness of Breath    Patient is not complaining of anything at this time. Patient was brought in due to wifes concern.     HPI Christian Wiley is a 84 y.o. male with a PMH of Dementia, AAA without rupture, PE on Eliquis, HTN, and Parkinson's Disease presenting after an episode of shortness of breath this morning. Patient arrived via EMS from home. EMS reports patient had an episode of shortness of breath after physical therapy. Patient is a poor historian due to dementia. Patient denies shortness of breath or any other symptoms currently.   Level 5 caveat.   Called and gathered additional information from wife. Wife reports patient had worsening generalized weakness and shortness of breath while at Physical Therapy. Wife reports he was recently admitted for pneumonia and UTI. Wife states patient has not been able to ambulate since July 11th. Patient has completed antibiotics. Wife states patient has not had fever, cough, chest pain, dysuria, hematuria, urinary frequency, nausea, vomiting, abdominal pain, sick contacts, or recent travel. Wife reports patient has been compliant with medications.      HPI  Past Medical History:  Diagnosis Date  . Abdominal aortic aneurysm, without rupture ([place])   . Anticoagulant long-term use    xarelto-- due to hx PE's   . Chronic constipation   . Chronic low back pain   . DDD (degenerative disc disease), lumbar   . GERD (gastroesophageal reflux disease)   . H/O endovascular stent graft for abdominal aortic aneurysm followed by dr Scot Dock   04/ 2001  s/p infarenal AAA w/ AneuRx graft stent:  last duplex 08-14-2016 repair patent, AAA sac size 4.5cm x 4.72cm (stable), no endoleak detected  . Hiatal hernia   . History of adenomatous polyp of colon    2010--  tubular adenoma and hyperplastic  . History of basal cell carcinoma excision    yrs ago  . History of BPH   . History of gout    per pt's wife gout stable as of 12-19-2016  . History of pulmonary embolus (PE)    04/ 2011 and 10/ 2014  . History of skin cancer   . HTN (hypertension)   . Hyperlipidemia   . Hypertension   . Hypothyroidism   . Left ureteral stone   . Mild sleep apnea    per study 06-04-2016 very mild sleep apnea not obstructive , cpap not recommended  . OA (osteoarthritis)   . Parkinson disease ([place])   . RBBB (right bundle branch block)   . Vascular dementia ([place])   . Vascular parkinsonism [facility])    neurologist-  dr tat  . Wears hearing aid    bilateral   . Wears partial dentures    upper    Patient Active Problem List   Diagnosis Date Noted  . Obstructive uropathy 06/25/2019  . Acute renal failure (ARF) ([place])   . Sepsis due to pneumonia ([place]) 06/20/2019  . Parkinson disease ([place])   . Vascular dementia ([place])   . Hypothyroidism   . History of pulmonary embolus (PE)   . Depression with anxiety   . Hyperlipidemia   . BPH (benign prostatic hyperplasia)   . Acute pulmonary embolism ([place])   . Goals of care, counseling/discussion   . Palliative care encounter   . Troponin level  elevated 01/09/2017  . Sepsis ([place]) 01/06/2017  . Sepsis secondary to UTI ([place]) 01/06/2017  . PBA (pseudobulbar affect) 06/24/2016  . Vascular dementia ([place]) 06/24/2016  . Greater trochanteric bursitis of right hip 07/15/2014  . Bursitis of hip 07/15/2014  . Pulmonary embolism ([place]) 10/01/2013    Class: Acute  . Thrombocytopenia ([place]) 10/01/2013    Class: Acute  . Hypertension 10/01/2013    Class: Chronic  . Abdominal aneurysm without mention of rupture 01/15/2012    Past Surgical History:  Procedure Laterality Date  . ABDOMINAL AORTIC ENDOVASCULAR STENT GRAFT  04/ 2001   dr Scot Dock   AneuRx graft stent  . ABDOMINAL AORTOGRAM N/A 06/12/2018   Procedure: ABDOMINAL AORTOGRAM;   Surgeon: Angelia Mould, MD;  Location: [place] [facility];  Service: Cardiovascular;  Laterality: N/A;  . CATARACT EXTRACTION W/ INTRAOCULAR LENS  IMPLANT, BILATERAL  2001 approx.  . CHOLECYSTECTOMY OPEN  1980's  . COLONOSCOPY  last one 01/ 2010  . CYSTOSCOPY/RETROGRADE/URETEROSCOPY/STONE EXTRACTION WITH BASKET Left 12/24/2016   Procedure: CYSTOSCOPY/RETROGRADE/URETEROSCOPY/STONE EXTRACTION WITH BASKET HOLMIUM LASER STENT PLACEMENT;  Surgeon: Festus Aloe, MD;  Location: [facility];  Service: Urology;  Laterality: Left;  . EXCISION/RELEASE BURSA HIP Right 07/15/2014   Procedure: RIGHT HIP BURSECTOMY WITH TENDON REPAIR ;  Surgeon: Gearlean Alf, MD;  Location: [facility];  Service: Orthopedics;  Laterality: Right;  . HOLMIUM LASER APPLICATION Left 0/35/0093   Procedure: HOLMIUM LASER APPLICATION;  Surgeon: Festus Aloe, MD;  Location: [facility];  Service: Urology;  Laterality: Left;  . IR ANGIOGRAM PELVIS SELECTIVE OR SUPRASELECTIVE  07/03/2018  . IR ANGIOGRAM PELVIS SELECTIVE OR SUPRASELECTIVE  07/03/2018  . IR RADIOLOGIST EVAL & MGMT  06/24/2018  . IR RADIOLOGIST EVAL & MGMT  09/01/2018  . IR US GUIDE VASC ACCESS LEFT  07/03/2018  . IR US GUIDE VASC ACCESS RIGHT  07/03/2018  . KNEE ARTHROSCOPY Bilateral 1990's-- right x2 ;  left x1  . LOWER EXTREMITY ANGIOGRAM  06/12/2018   Procedure: Lower Extremity Angiogram;  Surgeon: Angelia Mould, MD;  Location: [place] [facility];  Service: Cardiovascular;;  . POSTERIOR LAMINECTOMY / DECOMPRESSION LUMBAR SPINE  03/24/2002   L2 -- S1  . POSTERIOR LUMBAR FUSION  11/16/2010   L2 -- L4  . SHOULDER OPEN ROTATOR CUFF REPAIR Bilateral lef 11/15/2009/  right 2013 approx  . TONSILLECTOMY  child  . TOTAL KNEE ARTHROPLASTY  right 08-08-2000/  left 01-28-2003  . TRANSURETHRAL RESECTION OF PROSTATE Left 12/24/2016   Procedure: TRANSURETHRAL RESECTION OF THE PROSTATE (TURP);  Surgeon: Festus Aloe, MD;  Location:  [facility];  Service: Urology;  Laterality: Left;        Home Medications    Prior to Admission medications   Medication Sig Start Date End Date Taking? Authorizing Provider  allopurinol (ZYLOPRIM) 300 MG tablet Take 300 mg by mouth every morning.     [provider]  allopurinol (ZYLOPRIM) 300 MG tablet Take 300 mg by mouth daily with breakfast.    [provider]  ALPRAZolam (XANAX) 0.5 MG tablet Take 0.25 mg by mouth at bedtime as needed for anxiety or sleep.  01/29/17   [provider]  ALPRAZolam Duanne Moron) 0.5 MG tablet Take 0.5 mg by mouth daily as needed for anxiety.    [provider]  atorvastatin (LIPITOR) 40 MG tablet Take 40 mg by mouth at bedtime.  06/25/13   [provider]  atorvastatin (LIPITOR) 40 MG tablet Take 40 mg  by mouth at bedtime.    [provider]  Eliquis DVT/PE Starter Pack (ELIQUIS STARTER PACK) 5 MG TABS Take as directed on package: start with two-5mg  tablets twice daily for 7 days. On day 8, switch to one-5mg  tablet twice daily. 06/25/19   [place] Eric J, DO  escitalopram (LEXAPRO) 10 MG tablet Take 10 mg by mouth every morning.  05/20/16   [provider]  escitalopram (LEXAPRO) 10 MG tablet Take 10 mg by mouth daily with breakfast.    [provider]  finasteride (PROSCAR) 5 MG tablet Take 5 mg daily by mouth. 07/26/17   [provider]  finasteride (PROSCAR) 5 MG tablet Take 5 mg by mouth daily with breakfast.    [provider]  levothyroxine (SYNTHROID) 25 MCG tablet Take 25 mcg by mouth daily before breakfast.    [provider]  levothyroxine (SYNTHROID, LEVOTHROID) 25 MCG tablet Take 25 mcg by mouth daily before breakfast.  12/05/15   [provider]  mirabegron ER (MYRBETRIQ) 25 MG TB24 tablet Take 25 mg by mouth daily.    [provider]  mirtazapine (REMERON) 15 MG tablet Take 15 mg by mouth at bedtime. 05/05/19   [provider]  nebivolol (BYSTOLIC) 5 MG tablet Take 5 mg by mouth every morning.     [provider]  nebivolol (BYSTOLIC) 5 MG tablet Take 5 mg by mouth daily with breakfast.    [provider]  omeprazole (PRILOSEC) 40 MG capsule Take 40 mg by mouth every other day.  10/03/17   [provider]  omeprazole (PRILOSEC) 40 MG capsule Take 40 mg by mouth See admin instructions. Take one capsule (40 mg) by mouth every Monday, Wednesday, Friday nights    [provider]  Pimavanserin Tartrate (NUPLAZID) 34 MG CAPS Take 1 capsule by mouth daily. 12/21/18   Tat, Eustace Quail, DO  Pimavanserin Tartrate (NUPLAZID) 34 MG CAPS Take 34 mg by mouth at bedtime.    [provider]  polyethylene glycol (MIRALAX / GLYCOLAX) packet Take 17 g by mouth daily as needed for moderate constipation.     [provider]  tamsulosin (FLOMAX) 0.4 MG CAPS capsule Take 1 capsule (0.4 mg total) by mouth daily. 06/26/19 07/26/19  [place] Eric J, DO    Family History Family History  Problem Relation Age of Onset  . Hyperlipidemia Mother   . Hypertension Mother   . Hyperlipidemia Father   . Hypertension Father   . Heart disease Father   . Heart attack Father        died age 48  . Cancer Sister   . Hyperlipidemia Brother   . Hypertension Daughter     Social History Social History   Tobacco Use  . Smoking status: Former Smoker    Years: 20.00    Types: Cigarettes    Quit date: 10/01/1974    Years since quitting: 44.7  . Smokeless tobacco: Never Used  Substance Use Topics  . Alcohol use: Not Currently  . Drug use: Not Currently     Allergies   Ambien [zolpidem tartrate] and Ambien [zolpidem tartrate]   Review of Systems Review of Systems  Unable to perform ROS: Dementia   Physical Exam Updated Vital Signs BP 106/67 (BP Location: Right Arm)   Pulse 74   Temp 97.6 F (36.4 C) (Oral)   Resp 18   Ht 6' (1.829 m)   Wt 83.9 kg   SpO2 97%   BMI 25.09  kg/m   Physical Exam Vitals signs and nursing note reviewed.  Constitutional:      General: He is not in acute distress.    Appearance: He is well-developed. He is not diaphoretic.  HENT:     Head: Normocephalic and atraumatic.     Mouth/Throat:     Mouth: Mucous membranes are moist.     Pharynx: No pharyngeal swelling or oropharyngeal exudate.  Eyes:     Extraocular Movements: Extraocular movements intact.     Pupils: Pupils are equal, round, and reactive to light.  Neck:     Musculoskeletal: Normal range of motion and neck supple.  Cardiovascular:     Rate and Rhythm: Normal rate and regular rhythm.     Heart sounds: Normal heart sounds. No murmur. No friction rub. No gallop.   Pulmonary:     Effort: Pulmonary effort is normal. No accessory muscle usage or respiratory distress.     Breath sounds: Normal breath sounds. No decreased breath sounds, wheezing, rhonchi or rales.     Comments: Patient is speaking in full sentences without difficulty. Oxygen saturation is 97% on room air.  Chest:     Chest wall: No tenderness.  Abdominal:     Palpations: Abdomen is soft.     Tenderness: There is no abdominal tenderness.  Musculoskeletal: Normal range of motion.     Right lower leg: He exhibits no tenderness. No edema.     Left lower leg: He exhibits no tenderness. No edema.  Skin:    General: Skin is warm.     Findings: No erythema or rash.  Neurological:     General: No focal deficit present.     Mental Status: He is alert. Mental status is at baseline.     Cranial Nerves: No cranial nerve deficit.     Comments: Patient is alert and oriented to person only. Wife reports this is patient's baseline. CN II-XII grossly intact. Sensation intact. 2+ DP and radial pulses. Patient is able to move extremities spontaneously. 4/5 strength in upper and lower extremities. Patient is not able to ambulate at this time.  Psychiatric:        Mood and Affect: Mood is not anxious.        Behavior:  Behavior is not agitated.    ED Treatments / Results  Labs (all labs ordered are listed, but only abnormal results are displayed) Labs Reviewed  BASIC METABOLIC PANEL  CBC WITH DIFFERENTIAL/PLATELET  URINALYSIS, ROUTINE W REFLEX MICROSCOPIC    EKG None  Radiology No results found.  Procedures Procedures (including critical care time)  Medications Ordered in ED Medications - No data to display   Initial Impression / Assessment and Plan / ED Course  I have reviewed the triage vital signs and the nursing notes.  Pertinent labs & imaging results that were available during my care of the patient were reviewed by me and considered in my medical decision making (see chart for details).  Clinical Course as of Jul 04 1638  Mon Jul 05, 2019  1406 WBCs are within normal limits.   WBC: 10.5 [AH]  1406 Low hemoglobin noted at 12.5. This appears to have improved compared to previous values.  Hemoglobin(!): 12.5 [AH]  1429 No active disease.  DG Chest Port 1 View [AH]  1503 UA reveals RBCs, small Hgb, and glucose.   Urinalysis, Routine w reflex microscopic(!) [AH]  1637 Initial troponin is 9.  Troponin I (High Sensitivity) [AH]  1637 Called and updated  wife. Wife states patient was supposed to have catheter removed Friday by home health nurse, but it was not removed because nurse did not have the replacement catheter if patient failed voiding trial. Will remove catheter in the ER and do a voiding trial.    [AH]    Clinical Course User Index [AH] Arville Lime, PA-C      Patient presents with shortness of breath and generalized weakness. Labs, vitals, and imaging reviewed. CXR is negative. Patient is afebrile and oxygen saturation have been stable on room air. EKG without ST changes. Initial troponin is 9. WBCs within normal limits. Ordered CTA due to recent PE. CTA, second troponin, and voiding trial pending.   At shift change care was transferred to [facility], [place] who will  follow pending studies, re-evaluate and determine disposition.    Findings and plan of care discussed with supervising physician Dr. Alvino Chapel.   Final Clinical Impressions(s) / ED Diagnoses   Final diagnoses:  SOB (shortness of breath)    ED Discharge Orders    None       Arville Lime, PA-C 07/05/19 1717    Arville Lime, PA-C 07/05/19 1717    Davonna Belling, MD 07/05/19 2149

## 2019-07-05 NOTE — ED Notes (Addendum)
PA aware of foley output

## 2019-07-05 NOTE — ED Notes (Signed)
[facility] contacted to take pt back to [place]. Paperwork printed

## 2019-07-05 NOTE — ED Notes (Signed)
Condom catheter placed on pt.

## 2019-07-06 DIAGNOSIS — R338 Other retention of urine: Secondary | ICD-10-CM | POA: Diagnosis not present

## 2019-07-06 DIAGNOSIS — Z743 Need for continuous supervision: Secondary | ICD-10-CM | POA: Diagnosis not present

## 2019-07-06 DIAGNOSIS — R279 Unspecified lack of coordination: Secondary | ICD-10-CM | POA: Diagnosis not present

## 2019-07-06 DIAGNOSIS — R5381 Other malaise: Secondary | ICD-10-CM | POA: Diagnosis not present

## 2019-07-06 DIAGNOSIS — Z466 Encounter for fitting and adjustment of urinary device: Secondary | ICD-10-CM | POA: Diagnosis not present

## 2019-07-06 LAB — URINE CULTURE: Culture: NO GROWTH

## 2019-07-06 NOTE — ED Notes (Signed)
[facility] at bedside 

## 2019-07-06 NOTE — ED Notes (Signed)
Wife updated. All pt belongings taken home via ptar

## 2019-07-07 DIAGNOSIS — G629 Polyneuropathy, unspecified: Secondary | ICD-10-CM | POA: Diagnosis not present

## 2019-07-07 DIAGNOSIS — Z955 Presence of coronary angioplasty implant and graft: Secondary | ICD-10-CM | POA: Diagnosis not present

## 2019-07-07 DIAGNOSIS — E785 Hyperlipidemia, unspecified: Secondary | ICD-10-CM | POA: Diagnosis not present

## 2019-07-07 DIAGNOSIS — M4726 Other spondylosis with radiculopathy, lumbar region: Secondary | ICD-10-CM | POA: Diagnosis not present

## 2019-07-07 DIAGNOSIS — F329 Major depressive disorder, single episode, unspecified: Secondary | ICD-10-CM | POA: Diagnosis not present

## 2019-07-07 DIAGNOSIS — I951 Orthostatic hypotension: Secondary | ICD-10-CM | POA: Diagnosis not present

## 2019-07-07 DIAGNOSIS — Z87891 Personal history of nicotine dependence: Secondary | ICD-10-CM | POA: Diagnosis not present

## 2019-07-07 DIAGNOSIS — E039 Hypothyroidism, unspecified: Secondary | ICD-10-CM | POA: Diagnosis not present

## 2019-07-07 DIAGNOSIS — Z96653 Presence of artificial knee joint, bilateral: Secondary | ICD-10-CM | POA: Diagnosis not present

## 2019-07-07 DIAGNOSIS — N401 Enlarged prostate with lower urinary tract symptoms: Secondary | ICD-10-CM | POA: Diagnosis not present

## 2019-07-07 DIAGNOSIS — G959 Disease of spinal cord, unspecified: Secondary | ICD-10-CM | POA: Diagnosis not present

## 2019-07-07 DIAGNOSIS — M1990 Unspecified osteoarthritis, unspecified site: Secondary | ICD-10-CM | POA: Diagnosis not present

## 2019-07-07 DIAGNOSIS — R351 Nocturia: Secondary | ICD-10-CM | POA: Diagnosis not present

## 2019-07-07 DIAGNOSIS — M1A9XX0 Chronic gout, unspecified, without tophus (tophi): Secondary | ICD-10-CM | POA: Diagnosis not present

## 2019-07-07 DIAGNOSIS — Z9049 Acquired absence of other specified parts of digestive tract: Secondary | ICD-10-CM | POA: Diagnosis not present

## 2019-07-07 DIAGNOSIS — G20 Parkinson's disease: Secondary | ICD-10-CM | POA: Diagnosis not present

## 2019-07-07 DIAGNOSIS — E663 Overweight: Secondary | ICD-10-CM | POA: Diagnosis not present

## 2019-07-07 DIAGNOSIS — N138 Other obstructive and reflux uropathy: Secondary | ICD-10-CM | POA: Diagnosis not present

## 2019-07-07 DIAGNOSIS — I10 Essential (primary) hypertension: Secondary | ICD-10-CM | POA: Diagnosis not present

## 2019-07-07 DIAGNOSIS — R7302 Impaired glucose tolerance (oral): Secondary | ICD-10-CM | POA: Diagnosis not present

## 2019-07-07 DIAGNOSIS — F1320 Sedative, hypnotic or anxiolytic dependence, uncomplicated: Secondary | ICD-10-CM | POA: Diagnosis not present

## 2019-07-07 DIAGNOSIS — K5909 Other constipation: Secondary | ICD-10-CM | POA: Diagnosis not present

## 2019-07-07 DIAGNOSIS — Z9089 Acquired absence of other organs: Secondary | ICD-10-CM | POA: Diagnosis not present

## 2019-07-07 DIAGNOSIS — K219 Gastro-esophageal reflux disease without esophagitis: Secondary | ICD-10-CM | POA: Diagnosis not present

## 2019-07-07 DIAGNOSIS — G4730 Sleep apnea, unspecified: Secondary | ICD-10-CM | POA: Diagnosis not present

## 2019-07-12 ENCOUNTER — Ambulatory Visit: Payer: PPO | Admitting: Podiatry

## 2019-07-12 DIAGNOSIS — G20 Parkinson's disease: Secondary | ICD-10-CM | POA: Diagnosis not present

## 2019-07-12 DIAGNOSIS — R351 Nocturia: Secondary | ICD-10-CM | POA: Diagnosis not present

## 2019-07-12 DIAGNOSIS — N138 Other obstructive and reflux uropathy: Secondary | ICD-10-CM | POA: Diagnosis not present

## 2019-07-12 DIAGNOSIS — K219 Gastro-esophageal reflux disease without esophagitis: Secondary | ICD-10-CM | POA: Diagnosis not present

## 2019-07-12 DIAGNOSIS — Z955 Presence of coronary angioplasty implant and graft: Secondary | ICD-10-CM | POA: Diagnosis not present

## 2019-07-12 DIAGNOSIS — G629 Polyneuropathy, unspecified: Secondary | ICD-10-CM | POA: Diagnosis not present

## 2019-07-12 DIAGNOSIS — K5909 Other constipation: Secondary | ICD-10-CM | POA: Diagnosis not present

## 2019-07-12 DIAGNOSIS — Z87891 Personal history of nicotine dependence: Secondary | ICD-10-CM | POA: Diagnosis not present

## 2019-07-12 DIAGNOSIS — I10 Essential (primary) hypertension: Secondary | ICD-10-CM | POA: Diagnosis not present

## 2019-07-12 DIAGNOSIS — M4726 Other spondylosis with radiculopathy, lumbar region: Secondary | ICD-10-CM | POA: Diagnosis not present

## 2019-07-12 DIAGNOSIS — E785 Hyperlipidemia, unspecified: Secondary | ICD-10-CM | POA: Diagnosis not present

## 2019-07-12 DIAGNOSIS — F1320 Sedative, hypnotic or anxiolytic dependence, uncomplicated: Secondary | ICD-10-CM | POA: Diagnosis not present

## 2019-07-12 DIAGNOSIS — I951 Orthostatic hypotension: Secondary | ICD-10-CM | POA: Diagnosis not present

## 2019-07-12 DIAGNOSIS — R7302 Impaired glucose tolerance (oral): Secondary | ICD-10-CM | POA: Diagnosis not present

## 2019-07-12 DIAGNOSIS — M1990 Unspecified osteoarthritis, unspecified site: Secondary | ICD-10-CM | POA: Diagnosis not present

## 2019-07-12 DIAGNOSIS — Z9049 Acquired absence of other specified parts of digestive tract: Secondary | ICD-10-CM | POA: Diagnosis not present

## 2019-07-12 DIAGNOSIS — Z96653 Presence of artificial knee joint, bilateral: Secondary | ICD-10-CM | POA: Diagnosis not present

## 2019-07-12 DIAGNOSIS — F329 Major depressive disorder, single episode, unspecified: Secondary | ICD-10-CM | POA: Diagnosis not present

## 2019-07-12 DIAGNOSIS — N401 Enlarged prostate with lower urinary tract symptoms: Secondary | ICD-10-CM | POA: Diagnosis not present

## 2019-07-12 DIAGNOSIS — E663 Overweight: Secondary | ICD-10-CM | POA: Diagnosis not present

## 2019-07-12 DIAGNOSIS — Z9089 Acquired absence of other organs: Secondary | ICD-10-CM | POA: Diagnosis not present

## 2019-07-12 DIAGNOSIS — G959 Disease of spinal cord, unspecified: Secondary | ICD-10-CM | POA: Diagnosis not present

## 2019-07-12 DIAGNOSIS — E039 Hypothyroidism, unspecified: Secondary | ICD-10-CM | POA: Diagnosis not present

## 2019-07-12 DIAGNOSIS — M1A9XX0 Chronic gout, unspecified, without tophus (tophi): Secondary | ICD-10-CM | POA: Diagnosis not present

## 2019-07-12 DIAGNOSIS — G4730 Sleep apnea, unspecified: Secondary | ICD-10-CM | POA: Diagnosis not present

## 2019-07-14 ENCOUNTER — Ambulatory Visit: Payer: PPO | Admitting: Vascular Surgery

## 2019-07-14 ENCOUNTER — Other Ambulatory Visit (HOSPITAL_COMMUNITY): Payer: PPO

## 2019-07-19 DIAGNOSIS — K219 Gastro-esophageal reflux disease without esophagitis: Secondary | ICD-10-CM | POA: Diagnosis not present

## 2019-07-19 DIAGNOSIS — G959 Disease of spinal cord, unspecified: Secondary | ICD-10-CM | POA: Diagnosis not present

## 2019-07-19 DIAGNOSIS — I951 Orthostatic hypotension: Secondary | ICD-10-CM | POA: Diagnosis not present

## 2019-07-19 DIAGNOSIS — I82412 Acute embolism and thrombosis of left femoral vein: Secondary | ICD-10-CM | POA: Diagnosis not present

## 2019-07-19 DIAGNOSIS — M47814 Spondylosis without myelopathy or radiculopathy, thoracic region: Secondary | ICD-10-CM | POA: Diagnosis not present

## 2019-07-19 DIAGNOSIS — N138 Other obstructive and reflux uropathy: Secondary | ICD-10-CM | POA: Diagnosis not present

## 2019-07-19 DIAGNOSIS — I051 Rheumatic mitral insufficiency: Secondary | ICD-10-CM | POA: Diagnosis not present

## 2019-07-19 DIAGNOSIS — N401 Enlarged prostate with lower urinary tract symptoms: Secondary | ICD-10-CM | POA: Diagnosis not present

## 2019-07-19 DIAGNOSIS — G629 Polyneuropathy, unspecified: Secondary | ICD-10-CM | POA: Diagnosis not present

## 2019-07-19 DIAGNOSIS — M1A9XX0 Chronic gout, unspecified, without tophus (tophi): Secondary | ICD-10-CM | POA: Diagnosis not present

## 2019-07-19 DIAGNOSIS — Z466 Encounter for fitting and adjustment of urinary device: Secondary | ICD-10-CM | POA: Diagnosis not present

## 2019-07-19 DIAGNOSIS — M4726 Other spondylosis with radiculopathy, lumbar region: Secondary | ICD-10-CM | POA: Diagnosis not present

## 2019-07-19 DIAGNOSIS — G20 Parkinson's disease: Secondary | ICD-10-CM | POA: Diagnosis not present

## 2019-07-19 DIAGNOSIS — I82432 Acute embolism and thrombosis of left popliteal vein: Secondary | ICD-10-CM | POA: Diagnosis not present

## 2019-07-19 DIAGNOSIS — E785 Hyperlipidemia, unspecified: Secondary | ICD-10-CM | POA: Diagnosis not present

## 2019-07-19 DIAGNOSIS — I700 Atherosclerosis of aorta: Secondary | ICD-10-CM | POA: Diagnosis not present

## 2019-07-19 DIAGNOSIS — I4510 Unspecified right bundle-branch block: Secondary | ICD-10-CM | POA: Diagnosis not present

## 2019-07-19 DIAGNOSIS — R1312 Dysphagia, oropharyngeal phase: Secondary | ICD-10-CM | POA: Diagnosis not present

## 2019-07-19 DIAGNOSIS — F0150 Vascular dementia without behavioral disturbance: Secondary | ICD-10-CM | POA: Diagnosis not present

## 2019-07-19 DIAGNOSIS — J181 Lobar pneumonia, unspecified organism: Secondary | ICD-10-CM | POA: Diagnosis not present

## 2019-07-19 DIAGNOSIS — M1990 Unspecified osteoarthritis, unspecified site: Secondary | ICD-10-CM | POA: Diagnosis not present

## 2019-07-19 DIAGNOSIS — I10 Essential (primary) hypertension: Secondary | ICD-10-CM | POA: Diagnosis not present

## 2019-07-19 DIAGNOSIS — I6782 Cerebral ischemia: Secondary | ICD-10-CM | POA: Diagnosis not present

## 2019-07-19 DIAGNOSIS — R351 Nocturia: Secondary | ICD-10-CM | POA: Diagnosis not present

## 2019-07-19 DIAGNOSIS — I2510 Atherosclerotic heart disease of native coronary artery without angina pectoris: Secondary | ICD-10-CM | POA: Diagnosis not present

## 2019-07-21 ENCOUNTER — Other Ambulatory Visit (HOSPITAL_COMMUNITY): Payer: PPO

## 2019-07-21 ENCOUNTER — Ambulatory Visit: Payer: PPO | Admitting: Vascular Surgery

## 2019-07-26 ENCOUNTER — Other Ambulatory Visit: Payer: Self-pay | Admitting: Neurology

## 2019-07-26 NOTE — Telephone Encounter (Signed)
Requested Prescriptions   Pending Prescriptions Disp Refills  . NUPLAZID 34 MG CAPS [Pharmacy Med Name: NUPLAZID 34 MG CAPSULE] 90 capsule 0    Sig: TAKE 1 CAPSULE BY MOUTH DAILY   Rx last filled:11/02/18 #90 1 refills  Pt last seen:03/16/19  Follow up appt scheduled:08/10/19

## 2019-08-10 ENCOUNTER — Ambulatory Visit: Payer: PPO | Admitting: Neurology

## 2019-08-19 ENCOUNTER — Telehealth: Payer: Self-pay | Admitting: Vascular Surgery

## 2019-08-25 ENCOUNTER — Ambulatory Visit: Payer: PPO | Admitting: Vascular Surgery

## 2019-08-25 ENCOUNTER — Other Ambulatory Visit (HOSPITAL_COMMUNITY): Payer: PPO

## 2019-09-09 DEATH — deceased

## 2020-02-15 IMAGING — CT CT ANGIOGRAPHY CHEST
2 of 6 series · 17 of 46 positions shown · IV contrast (ISOVUE)
Comparison: Chest x-ray 07/05/2019, CT angiography chest 06/20/2019

CLINICAL DATA: Shortness of breath, history of pulmonary emboli

EXAM:
CT ANGIOGRAPHY CHEST WITH CONTRAST
TECHNIQUE: Multidetector CT imaging of the chest was performed using the
standard protocol during bolus administration of intravenous
contrast. Multiplanar CT image reconstructions and MIPs were
obtained to evaluate the vascular anatomy.
CONTRAST:  100mL OMNIPAQUE IOHEXOL 350 MG/ML SOLN

[Series 5: thins · axial · 0.76mm/px · z∈[-331,-61]mm · 14 of 296 slices shown]
[im 13/296  lung]
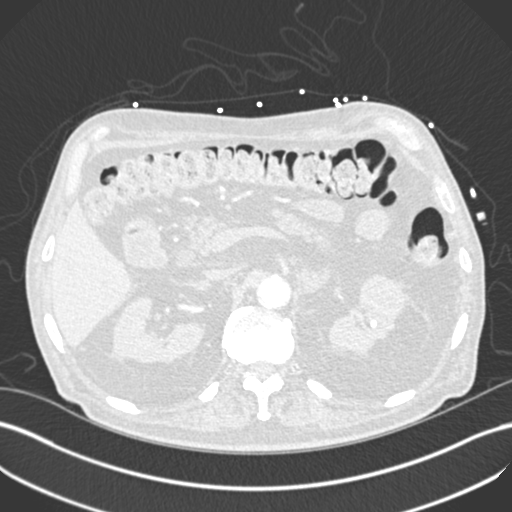
[im 39/296  soft-tissue]
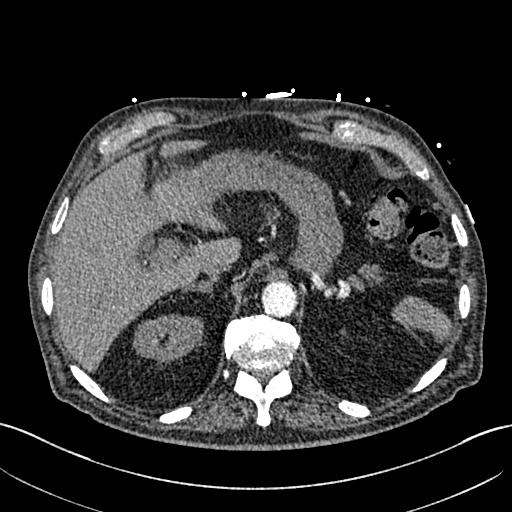
[im 52/296  lung]
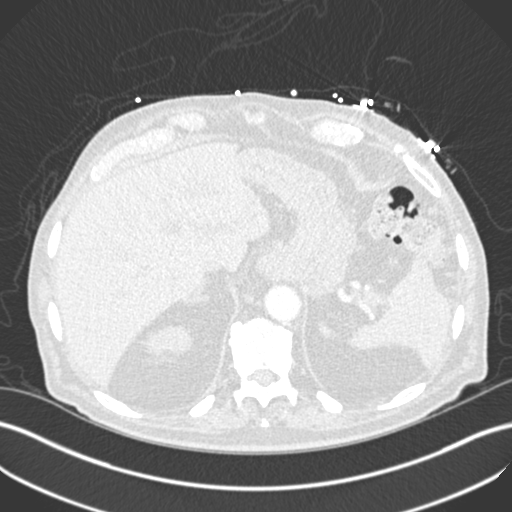
[im 77/296  soft-tissue]
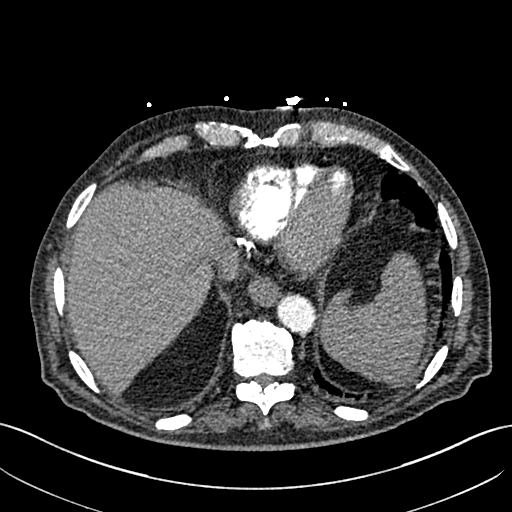
[im 103/296  lung]
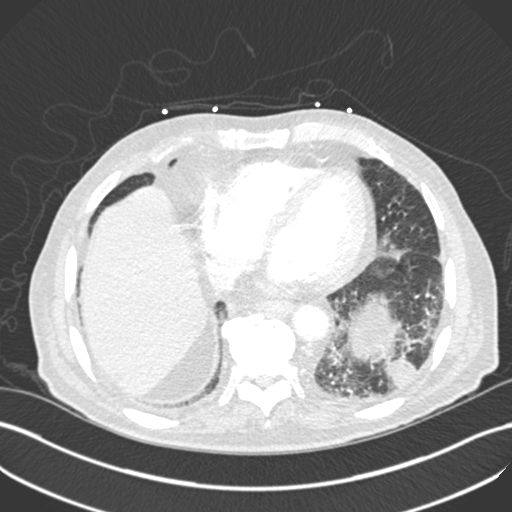
[im 116/296  soft-tissue]
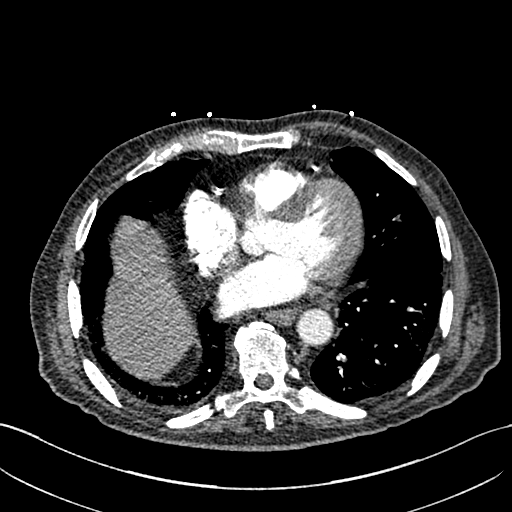
[im 142/296  lung]
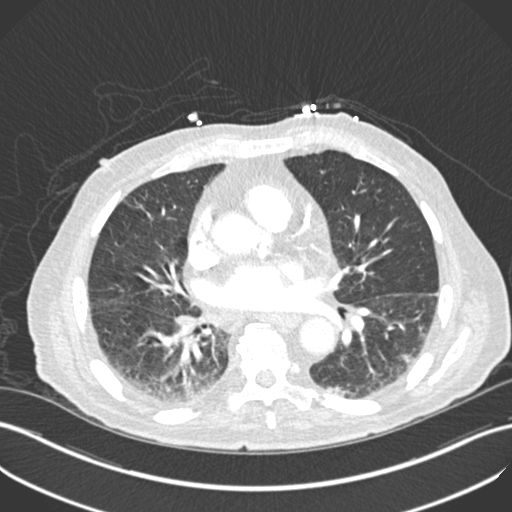
[im 154/296  soft-tissue]
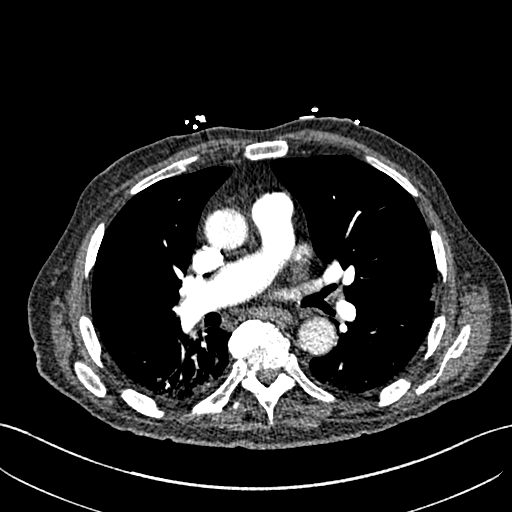
[im 180/296  lung]
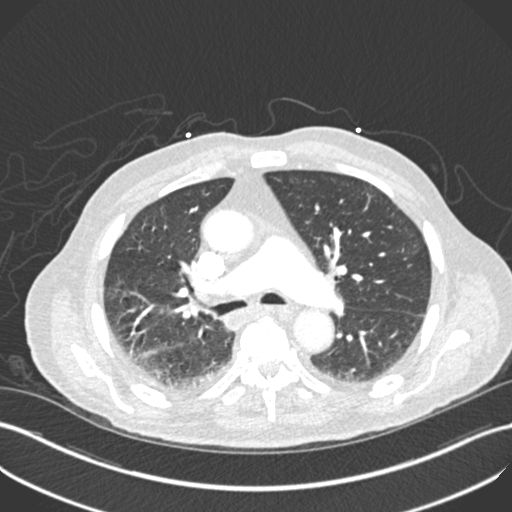
[im 193/296  soft-tissue]
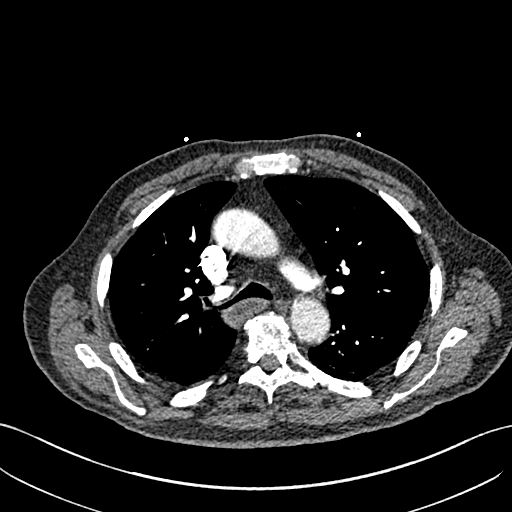
[im 219/296  lung]
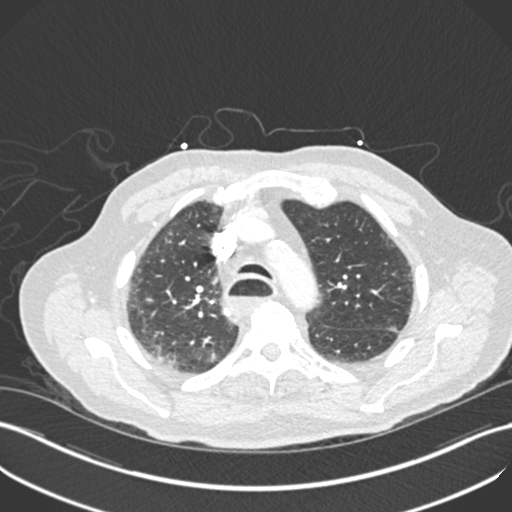
[im 244/296  soft-tissue]
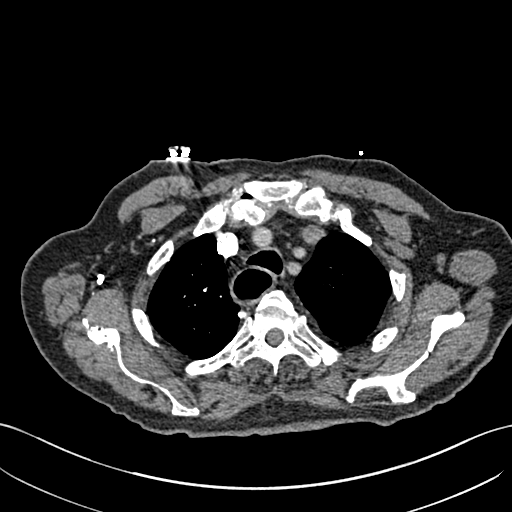
[im 257/296  lung]
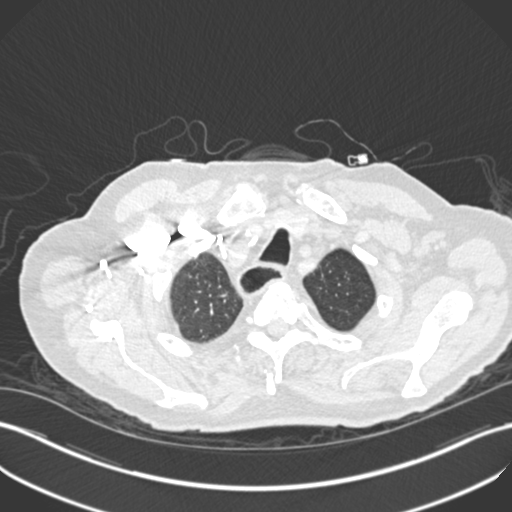
[im 283/296  soft-tissue]
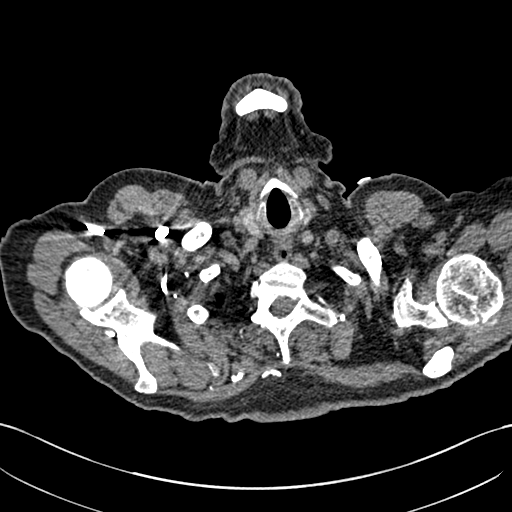

[Series 7: coronal mpr · coronal · 0.58mm/px · 3 of 151 slices shown]
[im 38/151  soft-tissue]
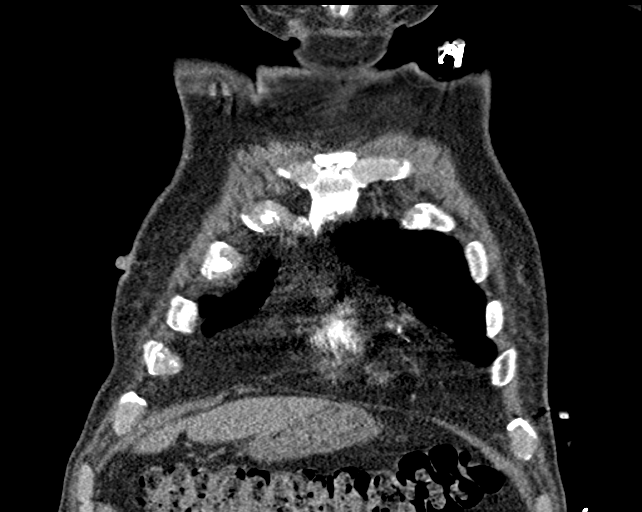
[im 76/151  soft-tissue]
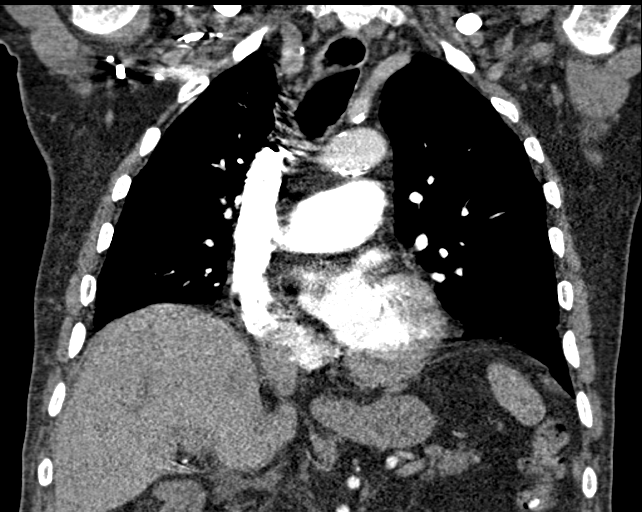
[im 113/151  soft-tissue]
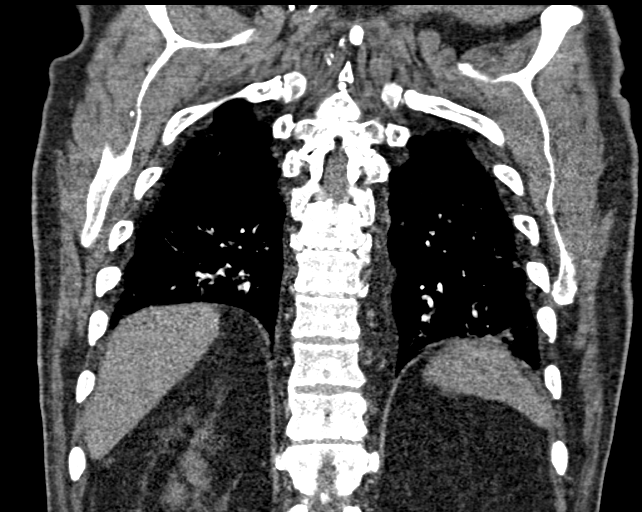

[17 of 46 positions shown; findings below may reference images not displayed]

FINDINGS: Cardiovascular: Satisfactory opacification of the pulmonary arteries
to the segmental level. The previously noted bilateral emboli have
largely resolved. No new acute pulmonary embolus is visualized.
Moderate aortic atherosclerosis. Common origin of the
brachiocephalic and left common carotid artery. Coronary vascular
calcification. Heart size upper normal. No pericardial effusion.

Mediastinum/Nodes: Midline trachea. No thyroid mass. Air and fluid
distension of the upper esophagus. No significant mediastinal
adenopathy.

Lungs/Pleura: Mild bilateral subpleural fibrosis. Improved aeration
of the lungs bilaterally with largely resolved pneumonia in the
lower lobes. Small focus left upper lobe peripheral airspace disease
and nodularity, suspect infectious or inflammatory. 2.1 cm rounded
area of airspace disease in the posterior left lower lobe, better
seen today likely due to clearing of surrounding airspace disease.
No pleural effusion.

Upper Abdomen: No acute abnormality. Cortical scarring within the
upper poles of both kidneys. Bilateral intrarenal calculi.

Musculoskeletal: Degenerative changes. No acute or suspicious
abnormality.

Review of the MIP images confirms the above findings.
IMPRESSION: 1. No definite acute pulmonary embolus is visualized. Previously
noted bilateral pulmonary emboli have largely resolved.
2. Improved aeration of the bilateral lungs with near complete
clearing of multifocal pneumonia. Small foci of residual nodular
airspace disease in the left upper lobe and bases, likely resolving
infiltrates. 2.1 cm low-density rounded focus in the left lower lobe
abutting the pleural surface, better seen today likely due to
clearing of surrounding airspace disease. This could represent
fluid-filled post infectious pneumatocele, or possibly resolving
pulmonary infarct. Short interval CT follow-up suggested to ensure
resolution.
3. Cortical scarring within the kidneys.  Bilateral kidney stones.

Aortic Atherosclerosis ([id number]-XDR.R).

## 2020-02-15 IMAGING — DX PORTABLE CHEST - 1 VIEW
1 series · 1 of 1 positions shown · non-contrast
Comparison: 05/25/2019

CLINICAL DATA: Shortness of breath during physical therapy today.

EXAM:
PORTABLE CHEST 1 VIEW

[chest ap]
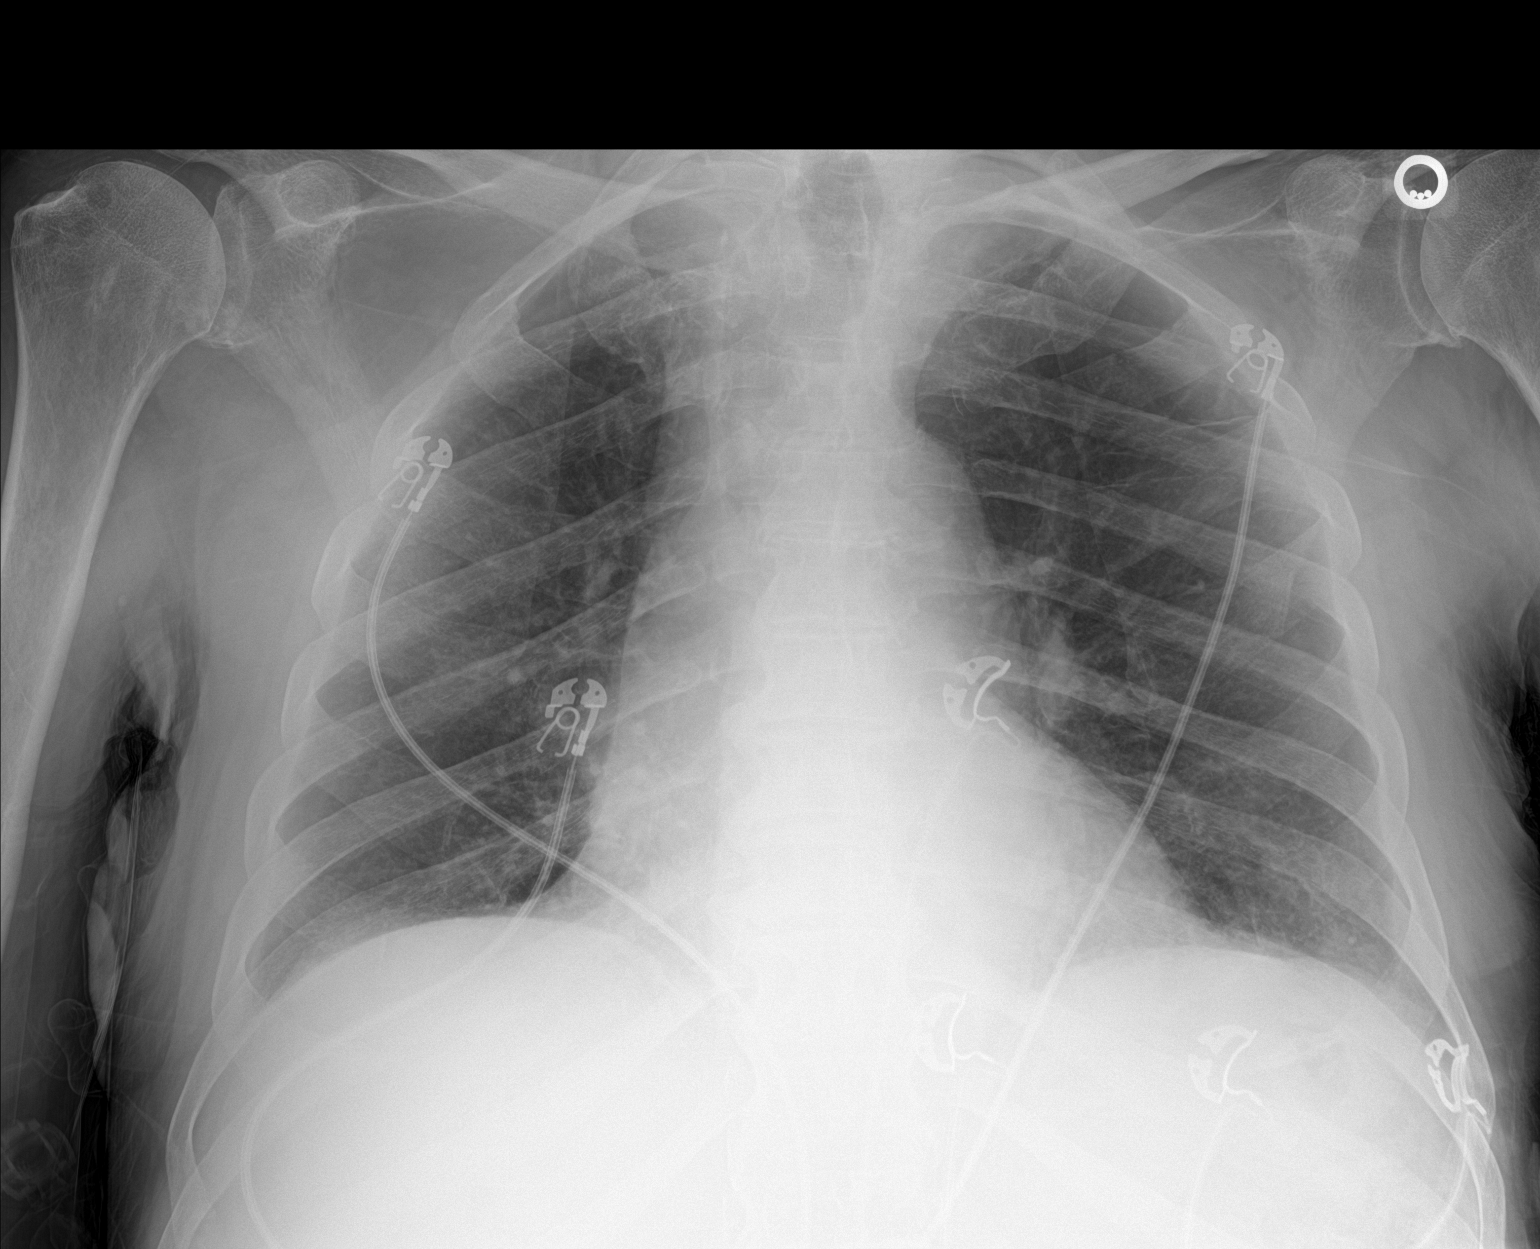

[1 of 1 positions shown; findings below may reference images not displayed]

FINDINGS: The cardiomediastinal silhouette is unchanged with normal heart
size. There is minimal atelectasis in the lung bases. Otherwise, no
airspace consolidation, edema, sizable pleural effusion,
pneumothorax is identified. No acute osseous abnormality is seen.
IMPRESSION: No active disease.
